# Patient Record
Sex: Female | Born: 1970 | Race: Black or African American | Hispanic: No | Smoking: Current every day smoker
Health system: Southern US, Community
[De-identification: ages and names within clinical notes are randomized; demographics above are authoritative.]

## PROBLEM LIST (undated history)

## (undated) DIAGNOSIS — E662 Morbid (severe) obesity with alveolar hypoventilation: Secondary | ICD-10-CM

## (undated) DIAGNOSIS — F4310 Post-traumatic stress disorder, unspecified: Secondary | ICD-10-CM

## (undated) DIAGNOSIS — I10 Essential (primary) hypertension: Secondary | ICD-10-CM

## (undated) DIAGNOSIS — F329 Major depressive disorder, single episode, unspecified: Secondary | ICD-10-CM

## (undated) DIAGNOSIS — F1910 Other psychoactive substance abuse, uncomplicated: Secondary | ICD-10-CM

## (undated) DIAGNOSIS — F419 Anxiety disorder, unspecified: Secondary | ICD-10-CM

## (undated) DIAGNOSIS — I5030 Unspecified diastolic (congestive) heart failure: Secondary | ICD-10-CM

## (undated) DIAGNOSIS — I639 Cerebral infarction, unspecified: Secondary | ICD-10-CM

## (undated) DIAGNOSIS — F32A Depression, unspecified: Secondary | ICD-10-CM

## (undated) DIAGNOSIS — J449 Chronic obstructive pulmonary disease, unspecified: Secondary | ICD-10-CM

## (undated) DIAGNOSIS — J45909 Unspecified asthma, uncomplicated: Secondary | ICD-10-CM

## (undated) HISTORY — PX: ECTOPIC PREGNANCY SURGERY: SHX613

## (undated) HISTORY — DX: Cerebral infarction, unspecified: I63.9

## (undated) HISTORY — DX: Morbid (severe) obesity with alveolar hypoventilation: E66.2

## (undated) HISTORY — DX: Other psychoactive substance abuse, uncomplicated: F19.10

## (undated) HISTORY — DX: Unspecified diastolic (congestive) heart failure: I50.30

---
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
## 2013-03-28 DIAGNOSIS — M545 Low back pain, unspecified: Secondary | ICD-10-CM | POA: Insufficient documentation

## 2013-03-28 DIAGNOSIS — D259 Leiomyoma of uterus, unspecified: Secondary | ICD-10-CM | POA: Insufficient documentation

## 2013-08-24 DIAGNOSIS — F4310 Post-traumatic stress disorder, unspecified: Secondary | ICD-10-CM | POA: Diagnosis present

## 2013-10-21 DIAGNOSIS — N83299 Other ovarian cyst, unspecified side: Secondary | ICD-10-CM | POA: Insufficient documentation

## 2013-10-21 DIAGNOSIS — F1010 Alcohol abuse, uncomplicated: Secondary | ICD-10-CM | POA: Insufficient documentation

## 2014-04-23 DIAGNOSIS — M65319 Trigger thumb, unspecified thumb: Secondary | ICD-10-CM | POA: Insufficient documentation

## 2014-05-10 DIAGNOSIS — G8929 Other chronic pain: Secondary | ICD-10-CM | POA: Insufficient documentation

## 2014-05-10 DIAGNOSIS — M79606 Pain in leg, unspecified: Secondary | ICD-10-CM | POA: Insufficient documentation

## 2014-06-03 DIAGNOSIS — G5603 Carpal tunnel syndrome, bilateral upper limbs: Secondary | ICD-10-CM | POA: Insufficient documentation

## 2014-10-21 DIAGNOSIS — F17210 Nicotine dependence, cigarettes, uncomplicated: Secondary | ICD-10-CM | POA: Insufficient documentation

## 2015-10-03 DIAGNOSIS — R32 Unspecified urinary incontinence: Secondary | ICD-10-CM | POA: Insufficient documentation

## 2015-10-03 DIAGNOSIS — Z30431 Encounter for routine checking of intrauterine contraceptive device: Secondary | ICD-10-CM | POA: Insufficient documentation

## 2016-03-06 DIAGNOSIS — E876 Hypokalemia: Secondary | ICD-10-CM | POA: Insufficient documentation

## 2016-03-06 DIAGNOSIS — K5900 Constipation, unspecified: Secondary | ICD-10-CM | POA: Insufficient documentation

## 2016-07-27 DIAGNOSIS — S161XXA Strain of muscle, fascia and tendon at neck level, initial encounter: Secondary | ICD-10-CM | POA: Insufficient documentation

## 2017-03-18 DIAGNOSIS — N289 Disorder of kidney and ureter, unspecified: Secondary | ICD-10-CM | POA: Insufficient documentation

## 2018-11-05 DIAGNOSIS — R112 Nausea with vomiting, unspecified: Secondary | ICD-10-CM | POA: Insufficient documentation

## 2018-11-06 DIAGNOSIS — F1910 Other psychoactive substance abuse, uncomplicated: Secondary | ICD-10-CM | POA: Insufficient documentation

## 2018-11-06 DIAGNOSIS — E785 Hyperlipidemia, unspecified: Secondary | ICD-10-CM | POA: Insufficient documentation

## 2019-07-28 DIAGNOSIS — F419 Anxiety disorder, unspecified: Secondary | ICD-10-CM | POA: Insufficient documentation

## 2019-10-21 ENCOUNTER — Encounter (HOSPITAL_COMMUNITY): Payer: Self-pay | Admitting: Emergency Medicine

## 2019-10-21 ENCOUNTER — Other Ambulatory Visit: Payer: Self-pay

## 2019-10-21 ENCOUNTER — Emergency Department (HOSPITAL_COMMUNITY)
Admission: EM | Admit: 2019-10-21 | Discharge: 2019-10-22 | Disposition: A | Discharge status: Home / Self Care | Payer: Self-pay | Attending: Emergency Medicine | Admitting: Emergency Medicine

## 2019-10-21 DIAGNOSIS — R110 Nausea: Secondary | ICD-10-CM | POA: Insufficient documentation

## 2019-10-21 DIAGNOSIS — N390 Urinary tract infection, site not specified: Secondary | ICD-10-CM | POA: Insufficient documentation

## 2019-10-21 DIAGNOSIS — I10 Essential (primary) hypertension: Secondary | ICD-10-CM | POA: Insufficient documentation

## 2019-10-21 DIAGNOSIS — J449 Chronic obstructive pulmonary disease, unspecified: Secondary | ICD-10-CM | POA: Insufficient documentation

## 2019-10-21 DIAGNOSIS — F17210 Nicotine dependence, cigarettes, uncomplicated: Secondary | ICD-10-CM | POA: Insufficient documentation

## 2019-10-21 DIAGNOSIS — J45909 Unspecified asthma, uncomplicated: Secondary | ICD-10-CM | POA: Insufficient documentation

## 2019-10-21 HISTORY — DX: Major depressive disorder, single episode, unspecified: F32.9

## 2019-10-21 HISTORY — DX: Chronic obstructive pulmonary disease, unspecified: J44.9

## 2019-10-21 HISTORY — DX: Anxiety disorder, unspecified: F41.9

## 2019-10-21 HISTORY — DX: Unspecified asthma, uncomplicated: J45.909

## 2019-10-21 HISTORY — DX: Essential (primary) hypertension: I10

## 2019-10-21 HISTORY — DX: Post-traumatic stress disorder, unspecified: F43.10

## 2019-10-21 LAB — COMPREHENSIVE METABOLIC PANEL
ALT: 16 U/L (ref 0–44)
AST: 20 U/L (ref 15–41)
Albumin: 4.2 g/dL (ref 3.5–5.0)
Alkaline Phosphatase: 67 U/L (ref 38–126)
Anion gap: 12 (ref 5–15)
BUN: 15 mg/dL (ref 6–20)
CO2: 22 mmol/L (ref 22–32)
Calcium: 9.7 mg/dL (ref 8.9–10.3)
Chloride: 104 mmol/L (ref 98–111)
Creatinine, Ser: 1.19 mg/dL — ABNORMAL HIGH (ref 0.44–1.00)
GFR calc Af Amer: >60 mL/min (ref >60)
GFR calc non Af Amer: 54 mL/min — ABNORMAL LOW (ref >60)
Glucose, Bld: 114 mg/dL — ABNORMAL HIGH (ref 70–99)
Potassium: 3.8 mmol/L (ref 3.5–5.1)
Sodium: 138 mmol/L (ref 135–145)
Total Bilirubin: 0.8 mg/dL (ref 0.3–1.2)
Total Protein: 8.2 g/dL — ABNORMAL HIGH (ref 6.5–8.1)

## 2019-10-21 LAB — CBC
HCT: 42.8 % (ref 36.0–46.0)
Hemoglobin: 13.7 g/dL (ref 12.0–15.0)
MCH: 29.5 pg (ref 26.0–34.0)
MCHC: 32 g/dL (ref 30.0–36.0)
MCV: 92 fL (ref 80.0–100.0)
Platelets: 298 10*3/uL (ref 150–400)
RBC: 4.65 MIL/uL (ref 3.87–5.11)
RDW: 13.6 % (ref 11.5–15.5)
WBC: 9.8 10*3/uL (ref 4.0–10.5)
nRBC: 0 % (ref 0.0–0.2)

## 2019-10-21 LAB — I-STAT BETA HCG BLOOD, ED (MC, WL, AP ONLY): I-stat hCG, quantitative: <5 m[IU]/mL (ref <5)

## 2019-10-21 LAB — LIPASE, BLOOD: Lipase: 20 U/L (ref 11–51)

## 2019-10-21 MED ORDER — ONDANSETRON HCL 4 MG/2ML IJ SOLN
4.0000 mg | Freq: Once | INTRAMUSCULAR | Status: AC
  Administered 2019-10-21: 4 mg via INTRAVENOUS
  Filled 2019-10-21: qty 2

## 2019-10-21 MED ORDER — SODIUM CHLORIDE 0.9 % IV BOLUS (SEPSIS)
1000.0000 mL | Freq: Once | INTRAVENOUS | Status: AC
  Administered 2019-10-21: 1000 mL via INTRAVENOUS

## 2019-10-21 MED ORDER — MORPHINE SULFATE (PF) 4 MG/ML IV SOLN
4.0000 mg | Freq: Once | INTRAVENOUS | Status: AC
  Administered 2019-10-21: 4 mg via INTRAVENOUS
  Filled 2019-10-21: qty 1

## 2019-10-21 MED ORDER — SODIUM CHLORIDE 0.9 % IV SOLN
1000.0000 mL | INTRAVENOUS | Status: DC
  Administered 2019-10-22 (×2): 1000 mL via INTRAVENOUS

## 2019-10-21 NOTE — ED Notes (Signed)
Pt became verbally aggressive with this nurse when she was told she could not have anything to eat or drink, as she is having emesis. Pt told this RN that "this is bullshit, I need to drink". Pt informed that she should not have anything to eat or drink until she is evaluated.

## 2019-10-21 NOTE — ED Notes (Addendum)
Pt had episode of emesis all over registration desk.  Digested food noted. Pt drinking cup of water from home at the time. Pt brought immediately to triage room.

## 2019-10-21 NOTE — ED Triage Notes (Signed)
Pt c/o abd pains with vomiting that started today. Denies urinary or bowel problems.  ?

## 2019-10-22 ENCOUNTER — Encounter (HOSPITAL_COMMUNITY): Payer: Self-pay

## 2019-10-22 ENCOUNTER — Emergency Department (HOSPITAL_COMMUNITY): Payer: Self-pay

## 2019-10-22 LAB — URINALYSIS, ROUTINE W REFLEX MICROSCOPIC
Bilirubin Urine: NEGATIVE
Glucose, UA: NEGATIVE mg/dL
Hgb urine dipstick: NEGATIVE
Ketones, ur: 20 mg/dL — AB
Nitrite: NEGATIVE
Protein, ur: 100 mg/dL — AB
Specific Gravity, Urine: 1.035 — ABNORMAL HIGH (ref 1.005–1.030)
pH: 5 (ref 5.0–8.0)

## 2019-10-22 IMAGING — CT CT ABD-PELV W/ CM
2 of 5 series · 16 of 46 positions shown, 18 images · IV contrast (OMNIPAQUE 300)
Comparison: None.

CLINICAL DATA: Acute abdominal pain. Prior surgery for ectopic
pregnancy.

EXAM:
CT ABDOMEN AND PELVIS WITH CONTRAST
TECHNIQUE: Multidetector CT imaging of the abdomen and pelvis was performed
using the standard protocol following bolus administration of
intravenous contrast.
CONTRAST:  100mL OMNIPAQUE IOHEXOL 300 MG/ML  SOLN

[Series 2: axial st · axial · 0.72mm/px · z∈[+1132,+1482]mm · 13 of 81 slices shown, 15 images]
[im 6/81  soft-tissue]
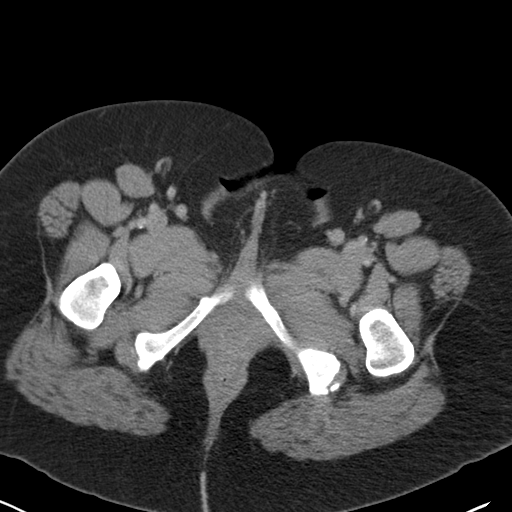
[im 6/81  bone]
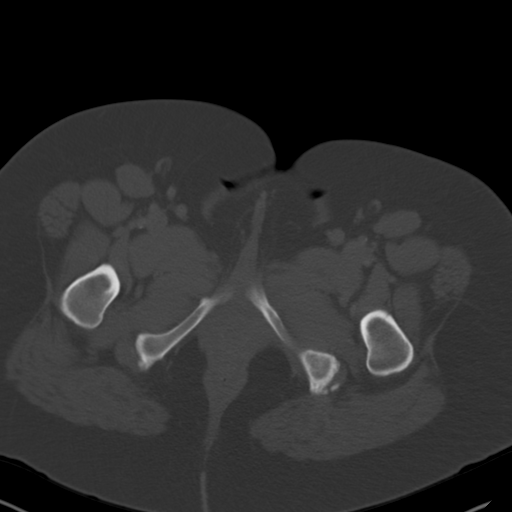
[im 11/81  soft-tissue]
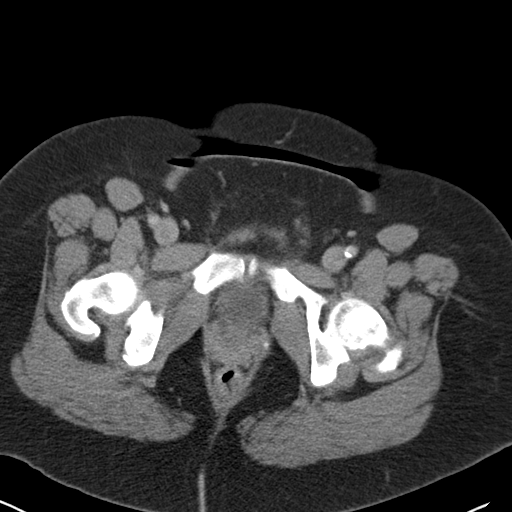
[im 16/81  soft-tissue]
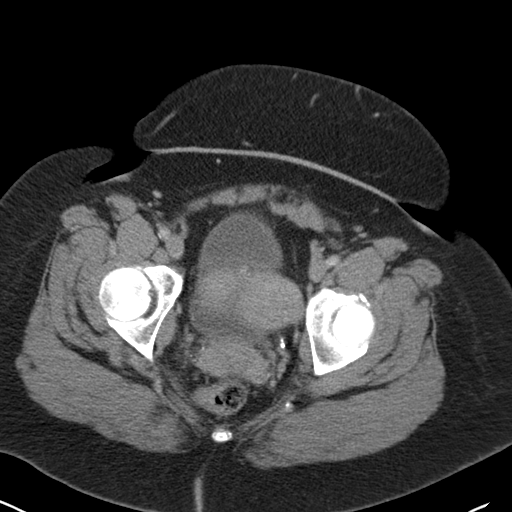
[im 26/81  soft-tissue]
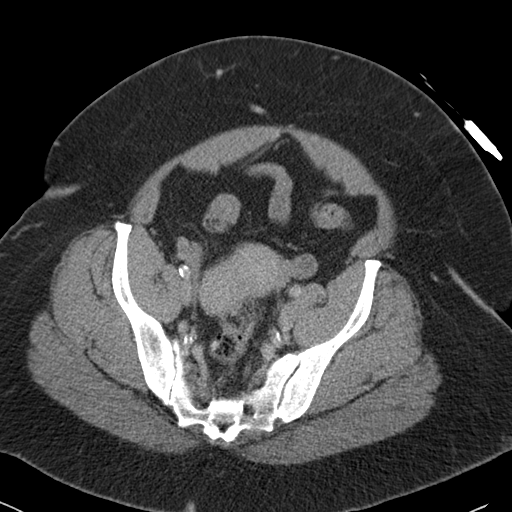
[im 31/81  soft-tissue]
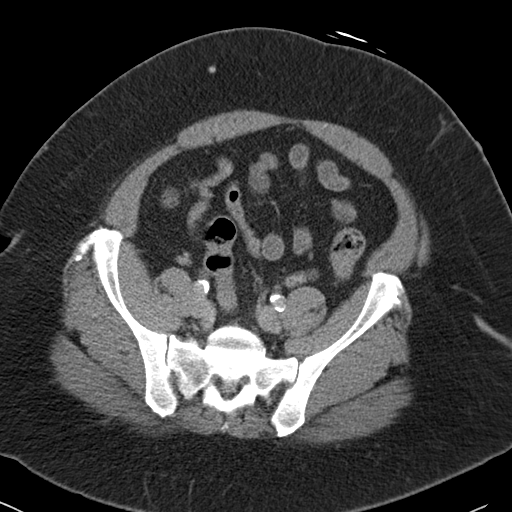
[im 36/81  soft-tissue]
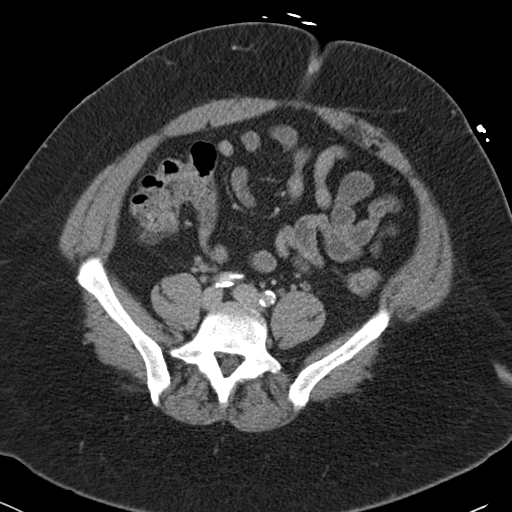
[im 41/81  soft-tissue]
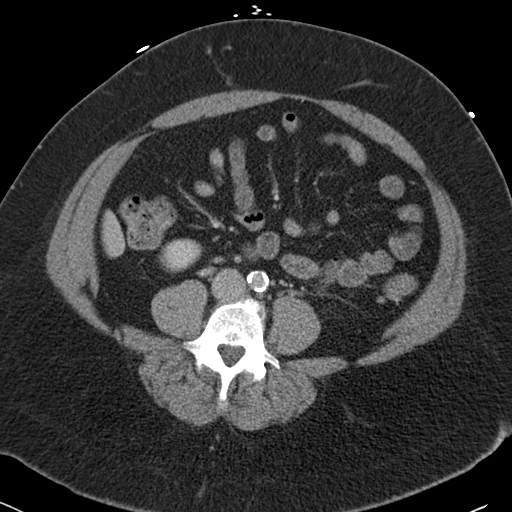
[im 46/81  soft-tissue]
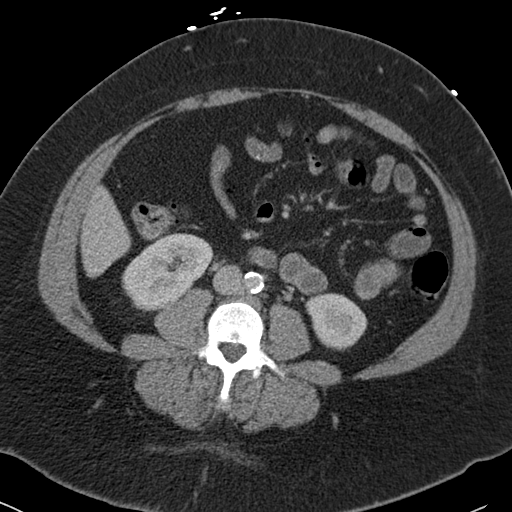
[im 51/81  soft-tissue]
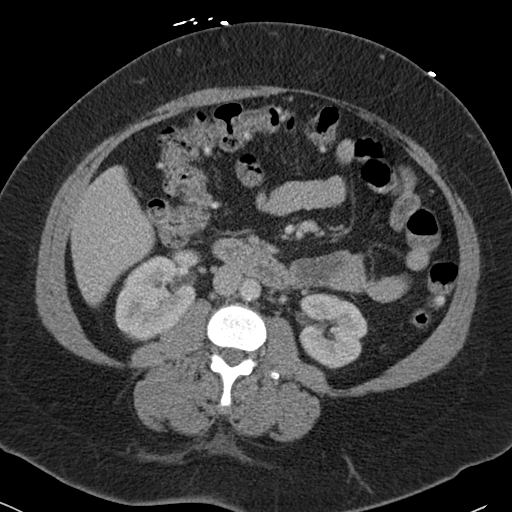
[im 51/81  bone]
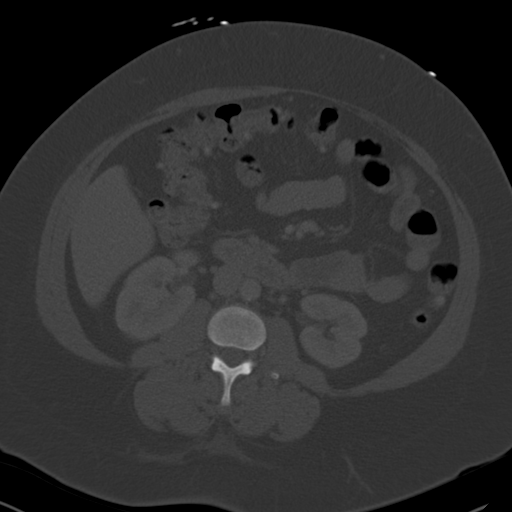
[im 56/81  soft-tissue]
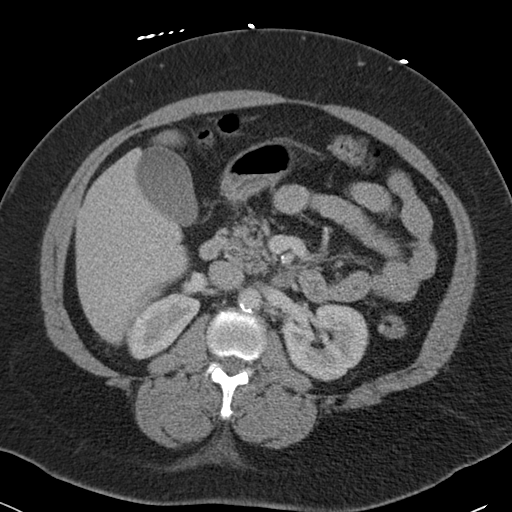
[im 66/81  soft-tissue]
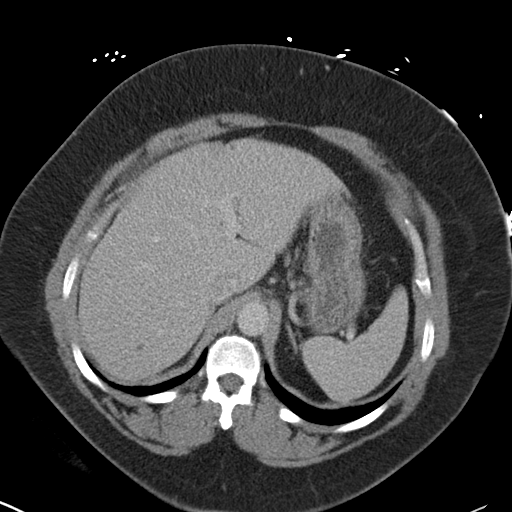
[im 71/81  soft-tissue]
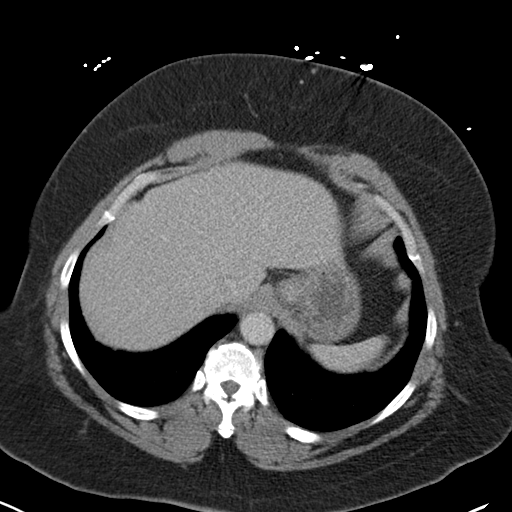
[im 76/81  soft-tissue]
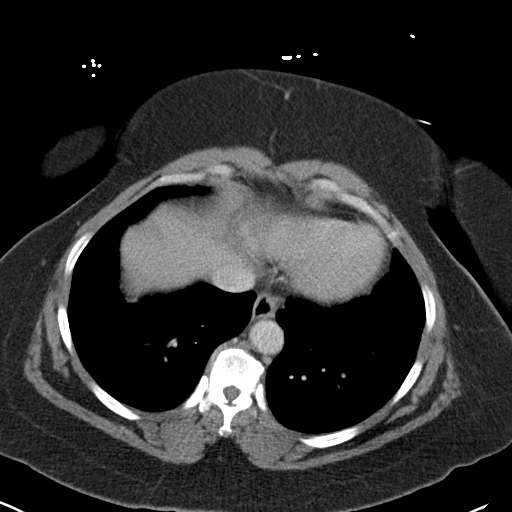

[Series 4: coronal st · coronal · 0.77mm/px · 3 of 160 slices shown]
[im 54/160  soft-tissue]
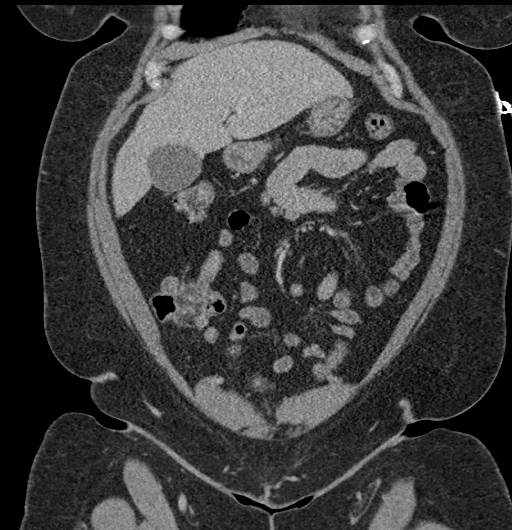
[im 71/160  soft-tissue]
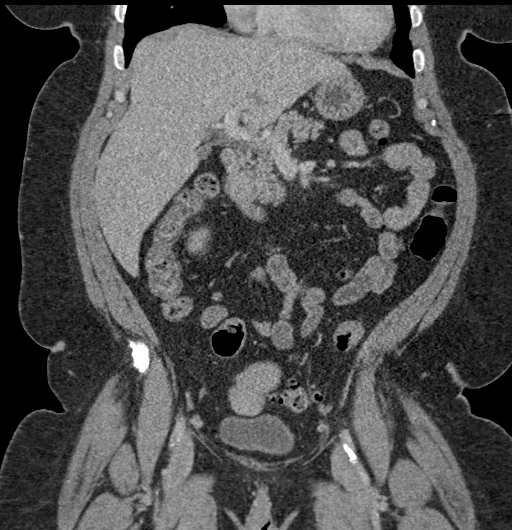
[im 89/160  soft-tissue]
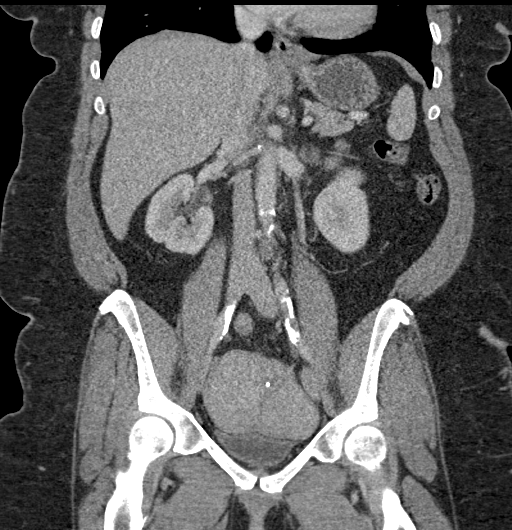

[16 of 46 positions shown; findings below may reference images not displayed]

FINDINGS: Lower chest: The lung bases are clear. The heart size is normal.

Hepatobiliary: There are multiple small hypoattenuating lesions
throughout the liver which are too small to characterize but are
statistically most likely to represent benign cysts. Normal
gallbladder.There is no biliary ductal dilation.

Pancreas: Normal contours without ductal dilatation. No
peripancreatic fluid collection.

Spleen: No splenic laceration or hematoma.

Adrenals/Urinary Tract:

--Adrenal glands: No adrenal hemorrhage.

--Right kidney/ureter: No hydronephrosis or perinephric hematoma.

--Left kidney/ureter: No hydronephrosis or perinephric hematoma.

--Urinary bladder: Unremarkable.

Stomach/Bowel:

--Stomach/Duodenum: No hiatal hernia or other gastric abnormality.
Normal duodenal course and caliber.

--Small bowel: No dilatation or inflammation.

--Colon: There are scattered colonic diverticula without CT evidence
for diverticulitis.

--Appendix: Normal.

Vascular/Lymphatic: Atherosclerotic calcification is present within
the non-aneurysmal abdominal aorta, without hemodynamically
significant stenosis.

--No retroperitoneal lymphadenopathy.

--No mesenteric lymphadenopathy.

--No pelvic or inguinal lymphadenopathy.

Reproductive: There is a bulky appearance of the uterus favored to
be secondary to multiple fibroids. An IUD is noted.

Other: No ascites or free air. The abdominal wall is normal.

Musculoskeletal. No acute displaced fractures.
IMPRESSION: 1. No acute abdominopelvic abnormality.
2. Bulky appearance of the uterus favored to be secondary to
multiple fibroids.
3. Scattered colonic diverticula without CT evidence for
diverticulitis.

Aortic Atherosclerosis ([5O]-[5O]).

## 2019-10-22 MED ORDER — OXYCODONE-ACETAMINOPHEN 5-325 MG PO TABS: 1.0000 | ORAL_TABLET | Freq: Three times a day (TID) | ORAL | 0 refills | Status: DC | PRN

## 2019-10-22 MED ORDER — ACETAMINOPHEN 500 MG PO TABS
1000.0000 mg | ORAL_TABLET | Freq: Once | ORAL | Status: AC
  Administered 2019-10-22: 1000 mg via ORAL
  Filled 2019-10-22: qty 2

## 2019-10-22 MED ORDER — PHENAZOPYRIDINE HCL 100 MG PO TABS
100.0000 mg | ORAL_TABLET | Freq: Three times a day (TID) | ORAL | Status: DC
  Administered 2019-10-22: 100 mg via ORAL
  Filled 2019-10-22: qty 1

## 2019-10-22 MED ORDER — IOHEXOL 300 MG/ML  SOLN
100.0000 mL | Freq: Once | INTRAMUSCULAR | Status: AC | PRN
  Administered 2019-10-22: 01:00:00 100 mL via INTRAVENOUS

## 2019-10-22 MED ORDER — SODIUM CHLORIDE (PF) 0.9 % IJ SOLN
INTRAMUSCULAR | Status: AC
  Filled 2019-10-22: qty 50

## 2019-10-22 MED ORDER — METOCLOPRAMIDE HCL 5 MG/ML IJ SOLN
10.0000 mg | Freq: Once | INTRAMUSCULAR | Status: AC
  Administered 2019-10-22: 10 mg via INTRAVENOUS
  Filled 2019-10-22: qty 2

## 2019-10-22 MED ORDER — ONDANSETRON HCL 4 MG/2ML IJ SOLN
INTRAMUSCULAR | Status: AC
  Filled 2019-10-22: qty 2

## 2019-10-22 MED ORDER — FENTANYL CITRATE (PF) 100 MCG/2ML IJ SOLN
50.0000 ug | Freq: Once | INTRAMUSCULAR | Status: AC
  Administered 2019-10-22: 50 ug via INTRAVENOUS
  Filled 2019-10-22: qty 2

## 2019-10-22 MED ORDER — METOCLOPRAMIDE HCL 10 MG PO TABS: 10.0000 mg | ORAL_TABLET | Freq: Four times a day (QID) | ORAL | 0 refills | Status: DC

## 2019-10-22 MED ORDER — LACTATED RINGERS IV BOLUS
1000.0000 mL | Freq: Once | INTRAVENOUS | Status: AC
  Administered 2019-10-22: 1000 mL via INTRAVENOUS

## 2019-10-22 MED ORDER — SODIUM CHLORIDE 0.9 % IV SOLN
1.0000 g | Freq: Once | INTRAVENOUS | Status: AC
  Administered 2019-10-22: 02:00:00 1 g via INTRAVENOUS
  Filled 2019-10-22: qty 10

## 2019-10-22 MED ORDER — CEPHALEXIN 500 MG PO CAPS: 500.0000 mg | ORAL_CAPSULE | Freq: Four times a day (QID) | ORAL | 0 refills | Status: DC

## 2019-10-22 NOTE — ED Provider Notes (Signed)
3:50 AM Assumed care from Dr. Tomi Bamberger, please see their note for full history, physical and decision making until this point. In brief this is a 48 y.o. year old female who presented to the ED tonight with Abdominal Pain and Emesis     Pending CT/urine and PO challenge/pain improved.   Ultimately work-up revealed urinary tract infection. With her systemic symptoms we will treat her for pyelonephritis although CT scan was negative. Patient tolerating p.o. Pain improved upon discharge. Supportive care at home with PCP follow-up in a week to ensure improvement.  Discharge instructions, including strict return precautions for new or worsening symptoms, given. Patient and/or family verbalized understanding and agreement with the plan as described.   Labs, studies and imaging reviewed by myself and considered in medical decision making if ordered. Imaging interpreted by radiology.  Labs Reviewed  COMPREHENSIVE METABOLIC PANEL - Abnormal; Notable for the following components:      Result Value   Glucose, Bld 114 (*)    Creatinine, Ser 1.19 (*)    Total Protein 8.2 (*)    GFR calc non Af Amer 54 (*)    All other components within normal limits  URINALYSIS, ROUTINE W REFLEX MICROSCOPIC - Abnormal; Notable for the following components:   Color, Urine AMBER (*)    APPearance CLOUDY (*)    Specific Gravity, Urine 1.035 (*)    Ketones, ur 20 (*)    Protein, ur 100 (*)    Leukocytes,Ua LARGE (*)    Bacteria, UA MANY (*)    All other components within normal limits  LIPASE, BLOOD  CBC  I-STAT BETA HCG BLOOD, ED ([facility], [facility], [facility] ONLY)    CT ABDOMEN PELVIS W CONTRAST  Final Result      No follow-ups on file.    Cathy Hall, Corene Cornea, MD 10/22/19 [phone]

## 2019-10-22 NOTE — ED Notes (Signed)
Went over discharge papers with pt, pt had no further questions or concerns. Pt assisted to wheelchair and taken to exit without incident. Pt given a blanket at discharge because she had thrown up on her clothes and was only wearing paper scrubs. Unable to obtain a discharge signature due to computer malfunction.

## 2019-10-22 NOTE — ED Provider Notes (Signed)
[facility]-EMERGENCY DEPT Provider Note   CSN: [id number] Arrival date & time: 10/21/19  1816     History Chief Complaint  Patient presents with  . Abdominal Pain  . Emesis    Cathy Hall is a 48 y.o. female.  HPI   Patient presented to the emergency room for evaluation of abdominal pain.  Pain feels like a burning discomfort in her upper abdomen.  Patient states her symptoms started today.  She began having episodes of vomiting.  She denies any diarrhea or dysuria.  She denies any fevers or chills.  No chest pain or shortness of breath.  Past Medical History:  Diagnosis Date  . Anxiety   . Asthma   . COPD (chronic obstructive pulmonary disease) (HCC)   . Hypertension   . Major depression   . PTSD (post-traumatic stress disorder)     There are no problems to display for this patient.   Past Surgical History:  Procedure Laterality Date  . ECTOPIC PREGNANCY SURGERY       OB History   No obstetric history on file.     No family history on file.  Social History   Tobacco Use  . Smoking status: Current Every Day Smoker    Types: Cigarettes  . Smokeless tobacco: Never Used  Substance Use Topics  . Alcohol use: Not on file  . Drug use: Not on file    Home Medications Prior to Admission medications   Not on File    Allergies    Patient has no known allergies.  Review of Systems   Review of Systems  All other systems reviewed and are negative.   Physical Exam Updated Vital Signs BP 121/88   Pulse 80   Temp 97.8 F (36.6 C) (Oral)   Resp 20   SpO2 97%   Physical Exam Vitals and nursing note reviewed.  Constitutional:      General: She is not in acute distress.    Appearance: She is well-developed.  HENT:     Head: Normocephalic and atraumatic.     Right Ear: External ear normal.     Left Ear: External ear normal.  Eyes:     General: No scleral icterus.       Right eye: No discharge.        Left eye:  No discharge.     Conjunctiva/sclera: Conjunctivae normal.  Neck:     Trachea: No tracheal deviation.  Cardiovascular:     Rate and Rhythm: Normal rate and regular rhythm.  Pulmonary:     Effort: Pulmonary effort is normal. No respiratory distress.     Breath sounds: Normal breath sounds. No stridor. No wheezing or rales.  Abdominal:     General: Bowel sounds are normal. There is no distension.     Palpations: Abdomen is soft.     Tenderness: There is abdominal tenderness in the epigastric area. There is no guarding or rebound.  Musculoskeletal:        General: No tenderness.     Cervical back: Neck supple.  Skin:    General: Skin is warm and dry.     Findings: No rash.  Neurological:     Mental Status: She is alert.     Cranial Nerves: No cranial nerve deficit (no facial droop, extraocular movements intact, no slurred speech).     Sensory: No sensory deficit.     Motor: No abnormal muscle tone or seizure activity.  Coordination: Coordination normal.     ED Results / Procedures / Treatments   Labs (all labs ordered are listed, but only abnormal results are displayed) Labs Reviewed  COMPREHENSIVE METABOLIC PANEL - Abnormal; Notable for the following components:      Result Value   Glucose, Bld 114 (*)    Creatinine, Ser 1.19 (*)    Total Protein 8.2 (*)    GFR calc non Af Amer 54 (*)    All other components within normal limits  LIPASE, BLOOD  CBC  URINALYSIS, ROUTINE W REFLEX MICROSCOPIC  I-STAT BETA HCG BLOOD, ED ([facility], [facility], [facility] ONLY)     Procedures Procedures (including critical care time)  Medications Ordered in ED Medications  sodium chloride 0.9 % bolus 1,000 mL (1,000 mLs Intravenous New Bag/Given 10/21/19 2344)    Followed by  0.9 %  sodium chloride infusion (has no administration in time range)  sodium chloride (PF) 0.9 % injection (has no administration in time range)  morphine 4 MG/ML injection 4 mg (4 mg Intravenous Given 10/21/19 2342)  ondansetron  (ZOFRAN) injection 4 mg (4 mg Intravenous Given 10/21/19 2341)    ED Course  I have reviewed the triage vital signs and the nursing notes.  Pertinent labs & imaging results that were available during my care of the patient were reviewed by me and considered in my medical decision making (see chart for details).    MDM Rules/Calculators/A&P                      Patient's laboratory tests are reassuring.  She had persistent tenderness on exam, ct scan ordered for further evaluation.  If negative, stable for dc symptomatic management.  Care turned over to oncoming team. Final Clinical Impression(s) / ED Diagnoses pending   Dorie Rank, MD 10/22/19 [phone]

## 2019-10-22 NOTE — ED Notes (Signed)
Pt. Documented in error see above note in chart. 

## 2019-11-05 ENCOUNTER — Emergency Department (HOSPITAL_COMMUNITY): Payer: No Typology Code available for payment source

## 2019-11-05 ENCOUNTER — Other Ambulatory Visit: Payer: Self-pay

## 2019-11-05 ENCOUNTER — Emergency Department (HOSPITAL_COMMUNITY)
Admission: EM | Admit: 2019-11-05 | Discharge: 2019-11-06 | Disposition: A | Discharge status: Home / Self Care | Payer: No Typology Code available for payment source | Attending: Emergency Medicine | Admitting: Emergency Medicine

## 2019-11-05 ENCOUNTER — Encounter (HOSPITAL_COMMUNITY): Payer: Self-pay

## 2019-11-05 DIAGNOSIS — R10817 Generalized abdominal tenderness: Secondary | ICD-10-CM | POA: Insufficient documentation

## 2019-11-05 DIAGNOSIS — Y999 Unspecified external cause status: Secondary | ICD-10-CM | POA: Insufficient documentation

## 2019-11-05 DIAGNOSIS — M79621 Pain in right upper arm: Secondary | ICD-10-CM | POA: Insufficient documentation

## 2019-11-05 DIAGNOSIS — M25512 Pain in left shoulder: Secondary | ICD-10-CM | POA: Insufficient documentation

## 2019-11-05 DIAGNOSIS — T1490XA Injury, unspecified, initial encounter: Secondary | ICD-10-CM | POA: Diagnosis present

## 2019-11-05 DIAGNOSIS — M79651 Pain in right thigh: Secondary | ICD-10-CM | POA: Insufficient documentation

## 2019-11-05 DIAGNOSIS — S8992XA Unspecified injury of left lower leg, initial encounter: Secondary | ICD-10-CM

## 2019-11-05 DIAGNOSIS — R4182 Altered mental status, unspecified: Secondary | ICD-10-CM | POA: Diagnosis not present

## 2019-11-05 DIAGNOSIS — R0789 Other chest pain: Secondary | ICD-10-CM | POA: Insufficient documentation

## 2019-11-05 DIAGNOSIS — Y929 Unspecified place or not applicable: Secondary | ICD-10-CM | POA: Insufficient documentation

## 2019-11-05 DIAGNOSIS — Y9301 Activity, walking, marching and hiking: Secondary | ICD-10-CM | POA: Diagnosis not present

## 2019-11-05 HISTORY — DX: Depression, unspecified: F32.A

## 2019-11-05 LAB — I-STAT CHEM 8, ED
BUN: 10 mg/dL (ref 6–20)
Calcium, Ion: 1.15 mmol/L (ref 1.15–1.40)
Chloride: 106 mmol/L (ref 98–111)
Creatinine, Ser: 1.2 mg/dL — ABNORMAL HIGH (ref 0.44–1.00)
Glucose, Bld: 142 mg/dL — ABNORMAL HIGH (ref 70–99)
HCT: 41 % (ref 36.0–46.0)
Hemoglobin: 13.9 g/dL (ref 12.0–15.0)
Potassium: 3.7 mmol/L (ref 3.5–5.1)
Sodium: 140 mmol/L (ref 135–145)
TCO2: 26 mmol/L (ref 22–32)

## 2019-11-05 LAB — CBC
HCT: 42.7 % (ref 36.0–46.0)
Hemoglobin: 13.7 g/dL (ref 12.0–15.0)
MCH: 30 pg (ref 26.0–34.0)
MCHC: 32.1 g/dL (ref 30.0–36.0)
MCV: 93.6 fL (ref 80.0–100.0)
Platelets: 266 10*3/uL (ref 150–400)
RBC: 4.56 MIL/uL (ref 3.87–5.11)
RDW: 13.8 % (ref 11.5–15.5)
WBC: 11.1 10*3/uL — ABNORMAL HIGH (ref 4.0–10.5)
nRBC: 0 % (ref 0.0–0.2)

## 2019-11-05 LAB — COMPREHENSIVE METABOLIC PANEL
ALT: 19 U/L (ref 0–44)
AST: 34 U/L (ref 15–41)
Albumin: 3.8 g/dL (ref 3.5–5.0)
Alkaline Phosphatase: 60 U/L (ref 38–126)
Anion gap: 11 (ref 5–15)
BUN: 11 mg/dL (ref 6–20)
CO2: 23 mmol/L (ref 22–32)
Calcium: 9.1 mg/dL (ref 8.9–10.3)
Chloride: 105 mmol/L (ref 98–111)
Creatinine, Ser: 1.2 mg/dL — ABNORMAL HIGH (ref 0.44–1.00)
GFR calc Af Amer: >60 mL/min (ref >60)
GFR calc non Af Amer: 54 mL/min — ABNORMAL LOW (ref >60)
Glucose, Bld: 149 mg/dL — ABNORMAL HIGH (ref 70–99)
Potassium: 3.6 mmol/L (ref 3.5–5.1)
Sodium: 139 mmol/L (ref 135–145)
Total Bilirubin: 0.6 mg/dL (ref 0.3–1.2)
Total Protein: 7 g/dL (ref 6.5–8.1)

## 2019-11-05 LAB — ETHANOL: Alcohol, Ethyl (B): <10 mg/dL (ref <10)

## 2019-11-05 LAB — I-STAT BETA HCG BLOOD, ED (MC, WL, AP ONLY): I-stat hCG, quantitative: <5 m[IU]/mL (ref <5)

## 2019-11-05 LAB — CDS SEROLOGY

## 2019-11-05 LAB — PROTIME-INR
INR: 1 (ref 0.8–1.2)
Prothrombin Time: 12.6 seconds (ref 11.4–15.2)

## 2019-11-05 LAB — SAMPLE TO BLOOD BANK

## 2019-11-05 LAB — LACTIC ACID, PLASMA: Lactic Acid, Venous: 1.6 mmol/L (ref 0.5–1.9)

## 2019-11-05 MED ORDER — FENTANYL CITRATE (PF) 100 MCG/2ML IJ SOLN
50.0000 ug | Freq: Once | INTRAMUSCULAR | Status: AC
  Administered 2019-11-05: 50 ug via INTRAVENOUS
  Filled 2019-11-05: qty 2

## 2019-11-05 MED ORDER — LORAZEPAM 2 MG/ML IJ SOLN
1.0000 mg | Freq: Once | INTRAMUSCULAR | Status: AC
  Administered 2019-11-05: 1 mg via INTRAVENOUS
  Filled 2019-11-05: qty 1

## 2019-11-05 MED ORDER — FENTANYL CITRATE (PF) 100 MCG/2ML IJ SOLN
50.0000 ug | Freq: Once | INTRAMUSCULAR | Status: AC
  Administered 2019-11-05: 50 ug via INTRAVENOUS

## 2019-11-05 MED ORDER — LACTATED RINGERS IV BOLUS
1000.0000 mL | Freq: Once | INTRAVENOUS | Status: AC
  Administered 2019-11-05: 1000 mL via INTRAVENOUS

## 2019-11-05 MED ORDER — IOHEXOL 300 MG/ML  SOLN
100.0000 mL | Freq: Once | INTRAMUSCULAR | Status: AC | PRN
  Administered 2019-11-05: 100 mL via INTRAVENOUS

## 2019-11-05 MED ORDER — HYDROCODONE-ACETAMINOPHEN 5-325 MG PO TABS
1.0000 | ORAL_TABLET | Freq: Once | ORAL | Status: DC
  Filled 2019-11-05: qty 1

## 2019-11-05 NOTE — ED Notes (Signed)
Pt is awake, rolling around, yelling and agitated. Water provided. Pt requesting sandwich, advised I would get one for her in a moment.
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
## 2019-11-05 NOTE — Progress Notes (Signed)
Chaplain called Williette's son and daughter in law.  Chaplain was able to touch base with son who is currently in [name] but not daughter in law who resides in [place].  Son said he would give daughter in law, Richmond, a call.  Chaplain will follow-up as needed.  Both contacts for son and daughter in law are listed in chart.

## 2019-11-05 NOTE — Progress Notes (Signed)
Trauma Response Nurse: Kriste Basque   Primary RN: Gwenith Daily   Interventions: Attempted IV start, removal of back board, assisted in assessment and FAST exam.   Complications: Difficult IV start, US guided IV gained by Dr. Elana Alm Resident. Blood work sent to lab,  [employer].  Plan of Care: CT after I-stat results, Xrays have been obtained. Chaplain and [employer] at the bedside.    Trauma Services [phone]
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
## 2019-11-06 MED ORDER — IBUPROFEN 800 MG PO TABS: 800.0000 mg | ORAL_TABLET | Freq: Three times a day (TID) | ORAL | 0 refills | Status: DC | PRN

## 2019-11-06 NOTE — ED Notes (Signed)
Attempted to get pt up to walk, pt refused to try to walk, pt requesting food

## 2019-11-06 NOTE — Discharge Instructions (Signed)
Return here as needed.  Follow-up with the orthopedist provided.  Ice and elevate the leg.

## 2019-11-06 NOTE — Progress Notes (Signed)
Orthopedic Tech Progress Note Patient Details:  Cathy Hall May 20, 1971 [id number] Nurse asked for a knee immobilizer Ortho Devices Type of Ortho Device: Knee Immobilizer Ortho Device/Splint Location: RLE Ortho Device/Splint Interventions: Ordered, Application   Post Interventions Patient Tolerated: Poor Instructions Provided: Care of device   Ancil Linsey 11/06/2019, 12:27 AM

## 2019-11-10 ENCOUNTER — Encounter (HOSPITAL_COMMUNITY): Payer: Self-pay

## 2019-12-27 DIAGNOSIS — I161 Hypertensive emergency: Secondary | ICD-10-CM | POA: Insufficient documentation

## 2019-12-31 DIAGNOSIS — Z634 Disappearance and death of family member: Secondary | ICD-10-CM | POA: Insufficient documentation

## 2019-12-31 DIAGNOSIS — R45851 Suicidal ideations: Secondary | ICD-10-CM | POA: Insufficient documentation

## 2020-07-17 ENCOUNTER — Emergency Department (HOSPITAL_COMMUNITY): Payer: Self-pay

## 2020-07-17 ENCOUNTER — Emergency Department (HOSPITAL_COMMUNITY)
Admission: EM | Admit: 2020-07-17 | Discharge: 2020-07-17 | Disposition: A | Discharge status: Home / Self Care | Payer: Self-pay | Attending: Emergency Medicine | Admitting: Emergency Medicine

## 2020-07-17 ENCOUNTER — Other Ambulatory Visit: Payer: Self-pay

## 2020-07-17 ENCOUNTER — Encounter (HOSPITAL_COMMUNITY): Payer: Self-pay | Admitting: Emergency Medicine

## 2020-07-17 DIAGNOSIS — J45909 Unspecified asthma, uncomplicated: Secondary | ICD-10-CM | POA: Insufficient documentation

## 2020-07-17 DIAGNOSIS — F17210 Nicotine dependence, cigarettes, uncomplicated: Secondary | ICD-10-CM | POA: Insufficient documentation

## 2020-07-17 DIAGNOSIS — Y939 Activity, unspecified: Secondary | ICD-10-CM | POA: Insufficient documentation

## 2020-07-17 DIAGNOSIS — W19XXXA Unspecified fall, initial encounter: Secondary | ICD-10-CM | POA: Insufficient documentation

## 2020-07-17 DIAGNOSIS — S7001XA Contusion of right hip, initial encounter: Secondary | ICD-10-CM | POA: Insufficient documentation

## 2020-07-17 DIAGNOSIS — Y929 Unspecified place or not applicable: Secondary | ICD-10-CM | POA: Insufficient documentation

## 2020-07-17 DIAGNOSIS — Y999 Unspecified external cause status: Secondary | ICD-10-CM | POA: Insufficient documentation

## 2020-07-17 DIAGNOSIS — J449 Chronic obstructive pulmonary disease, unspecified: Secondary | ICD-10-CM | POA: Insufficient documentation

## 2020-07-17 DIAGNOSIS — S8001XA Contusion of right knee, initial encounter: Secondary | ICD-10-CM | POA: Insufficient documentation

## 2020-07-17 DIAGNOSIS — I10 Essential (primary) hypertension: Secondary | ICD-10-CM | POA: Insufficient documentation

## 2020-07-17 IMAGING — CR DG HIP (WITH OR WITHOUT PELVIS) 2-3V*R*
3 series · 3 of 3 positions shown · non-contrast
Comparison: CT abdomen and pelvis from [DATE]

CLINICAL DATA: Fall, RIGHT knee pain hip main in shoulder pain

EXAM:
DG HIP (WITH OR WITHOUT PELVIS) 2-3V RIGHT

[t hip ap right (1 of 2)]
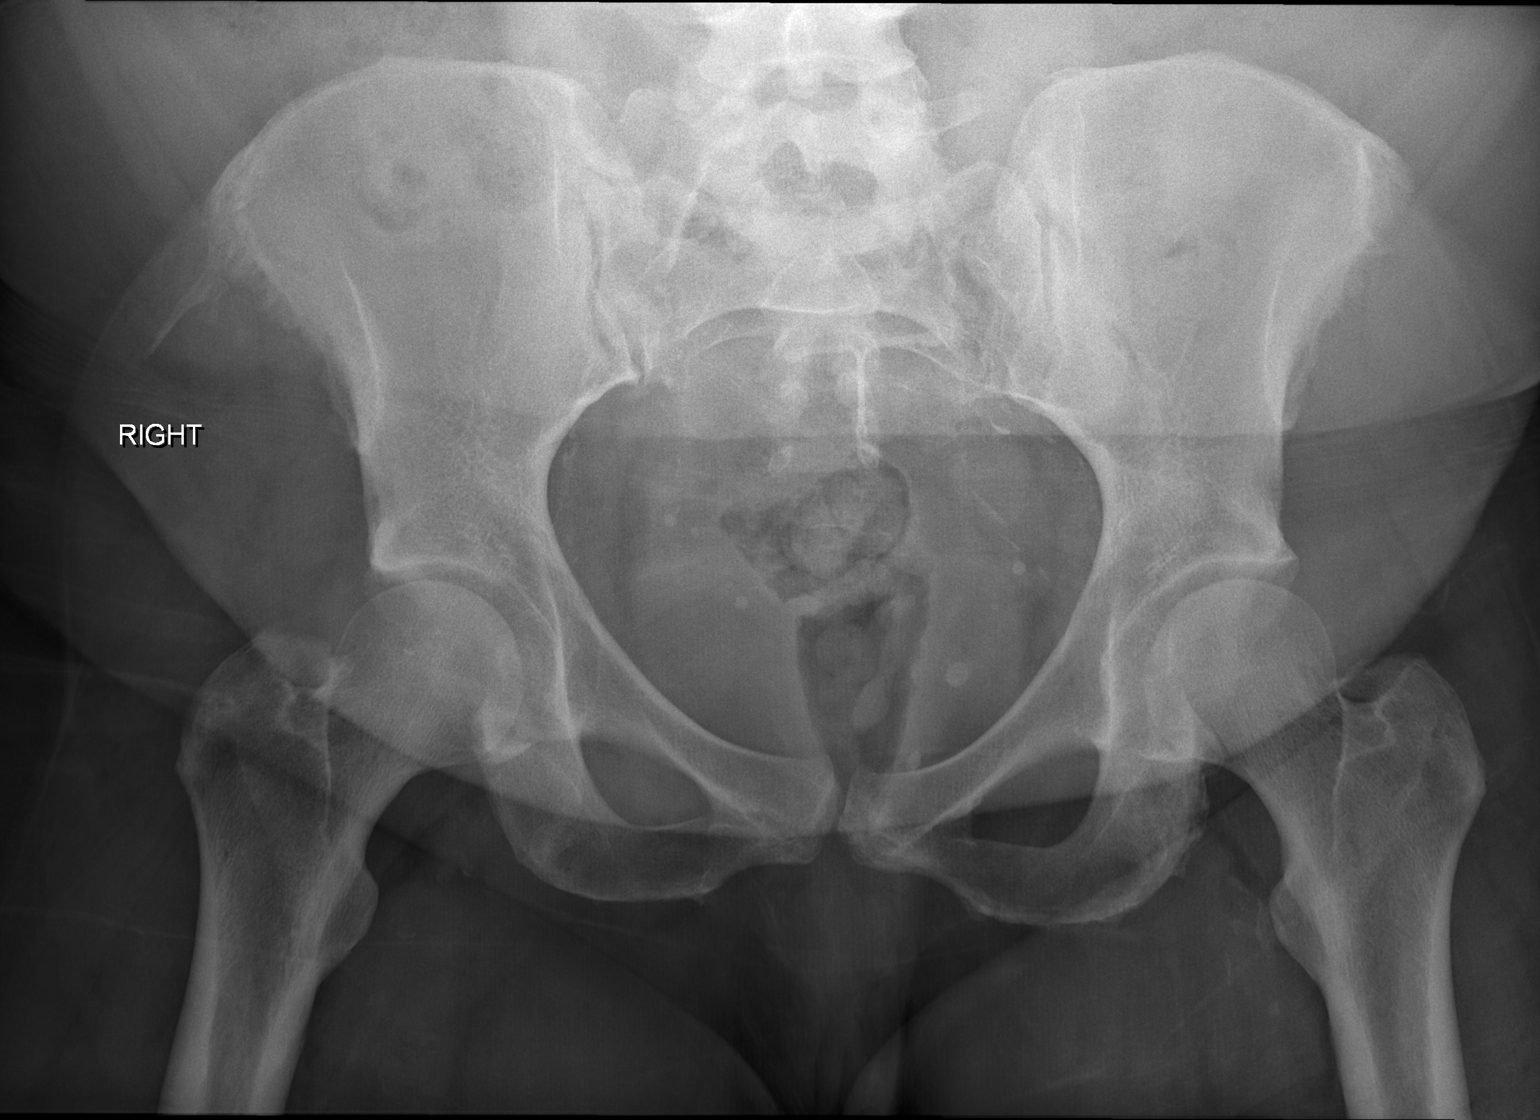

[t hip ap right (2 of 2)]
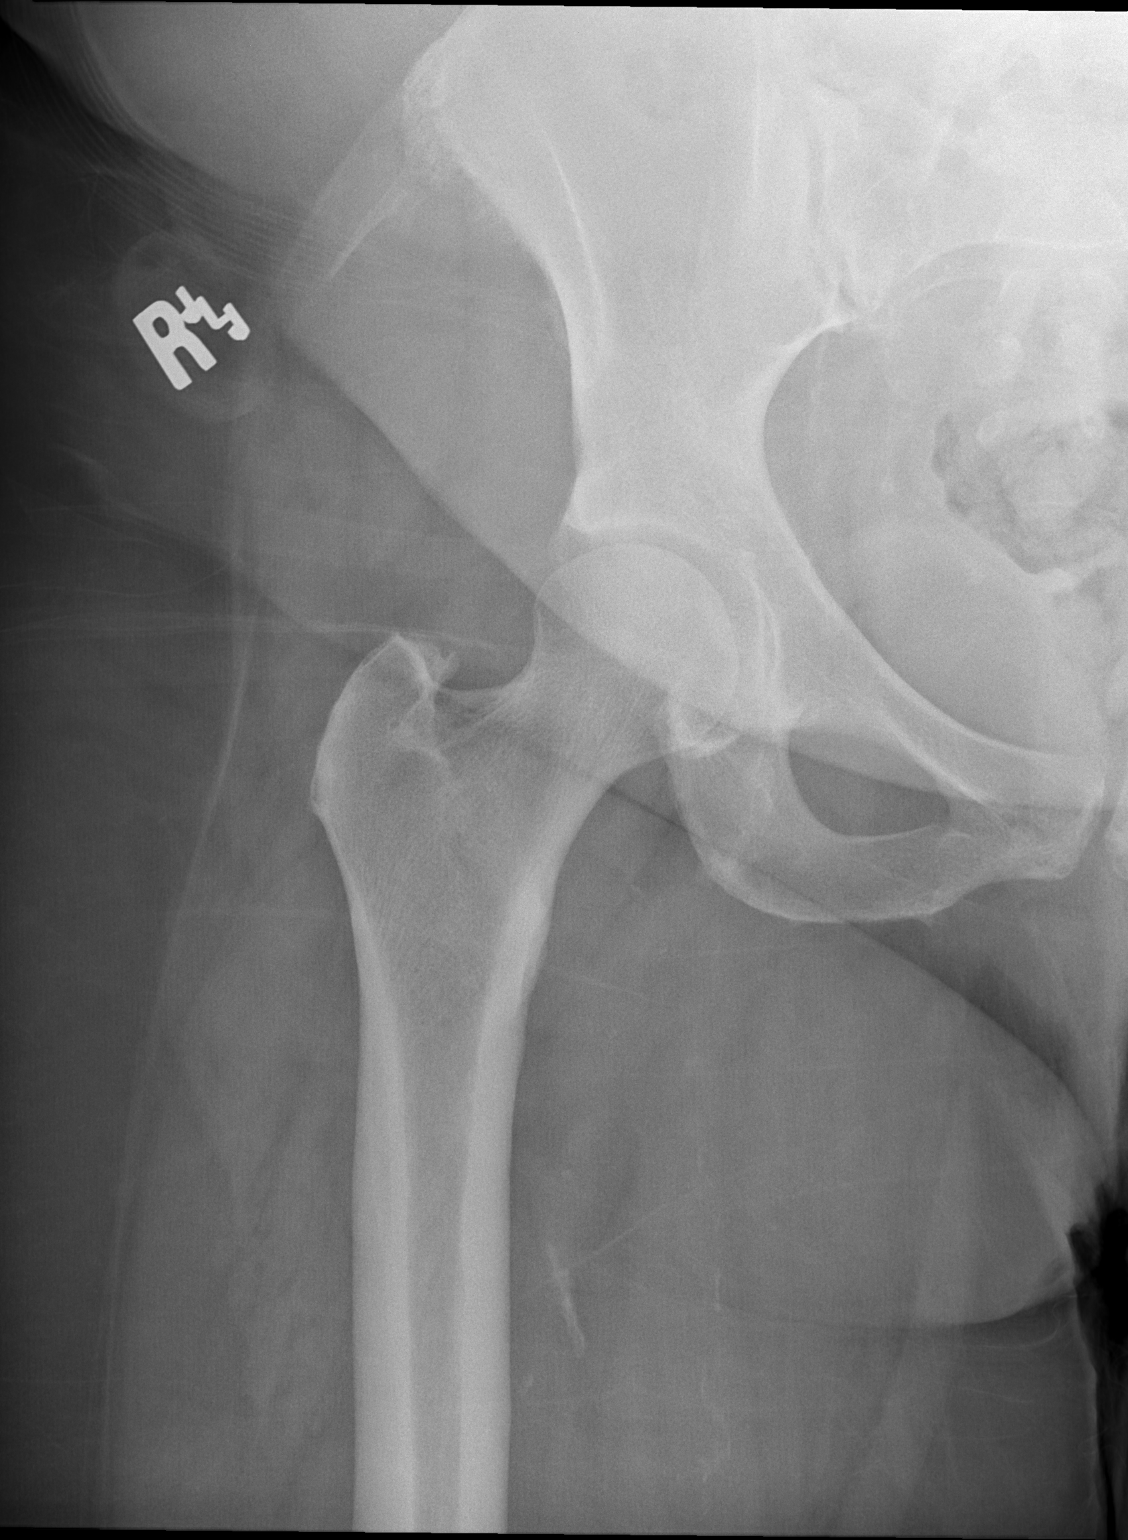

[t hip frog leg right]
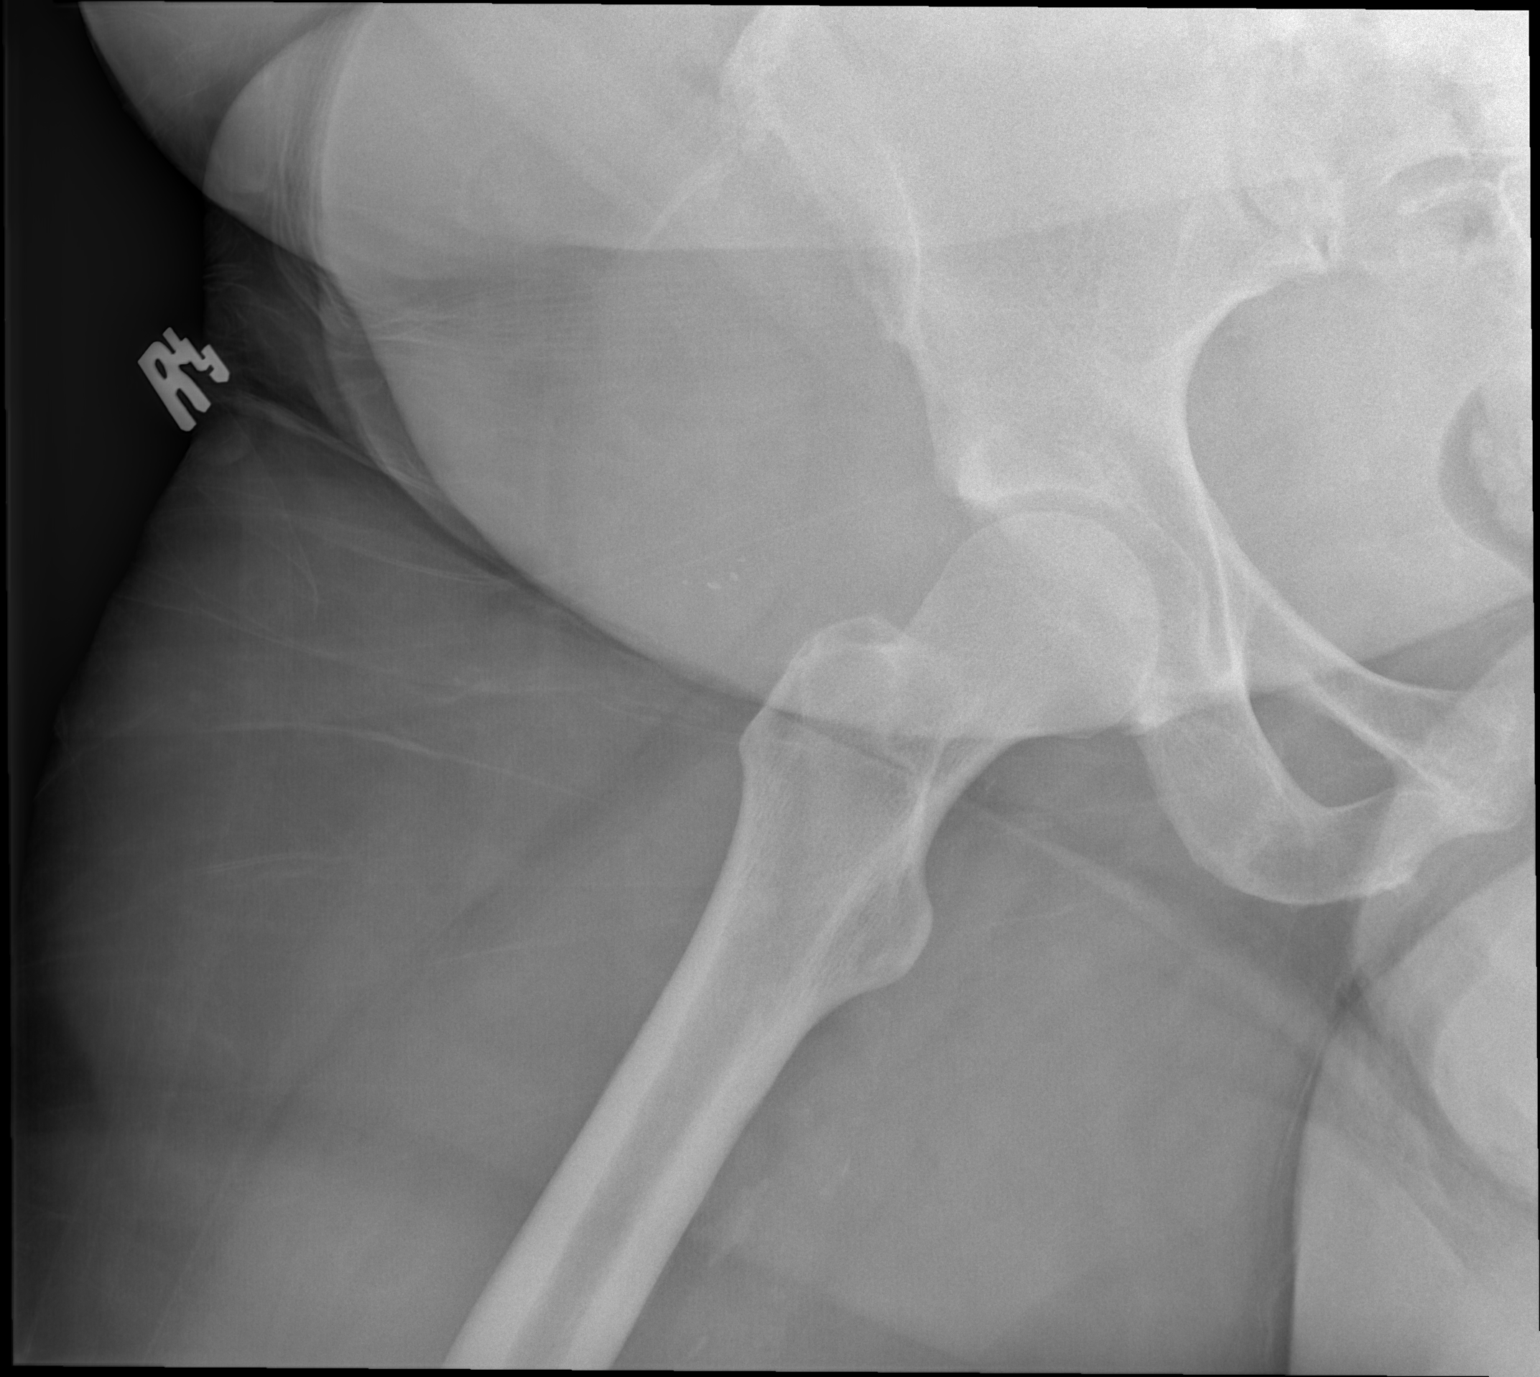

[3 of 3 positions shown; findings below may reference images not displayed]

FINDINGS: Lucency overlies the LEFT hemi sacrum, unclear whether this is due
to bowel gas or sacral trauma/fracture.

No additional signs of fracture about the pelvis. Enthesopathy along
the anterior superior iliac spine bilaterally.

Hips appear located on AP view.

RIGHT hip is located without sign of fracture or dislocation.
IMPRESSION: Lucency overlies the LEFT hemi sacrum, unclear whether this is due
to bowel gas or sacral trauma/fracture. CT may be helpful for
further evaluation. No signs of fracture or dislocation of the RIGHT
hip.

## 2020-07-17 IMAGING — CR DG SHOULDER 2+V*R*
2 series · 2 of 2 positions shown · non-contrast
Comparison: None

CLINICAL DATA: Fall, hip knee and shoulder pain

EXAM:
RIGHT SHOULDER - 2+ VIEW

[t shoulder internal right]
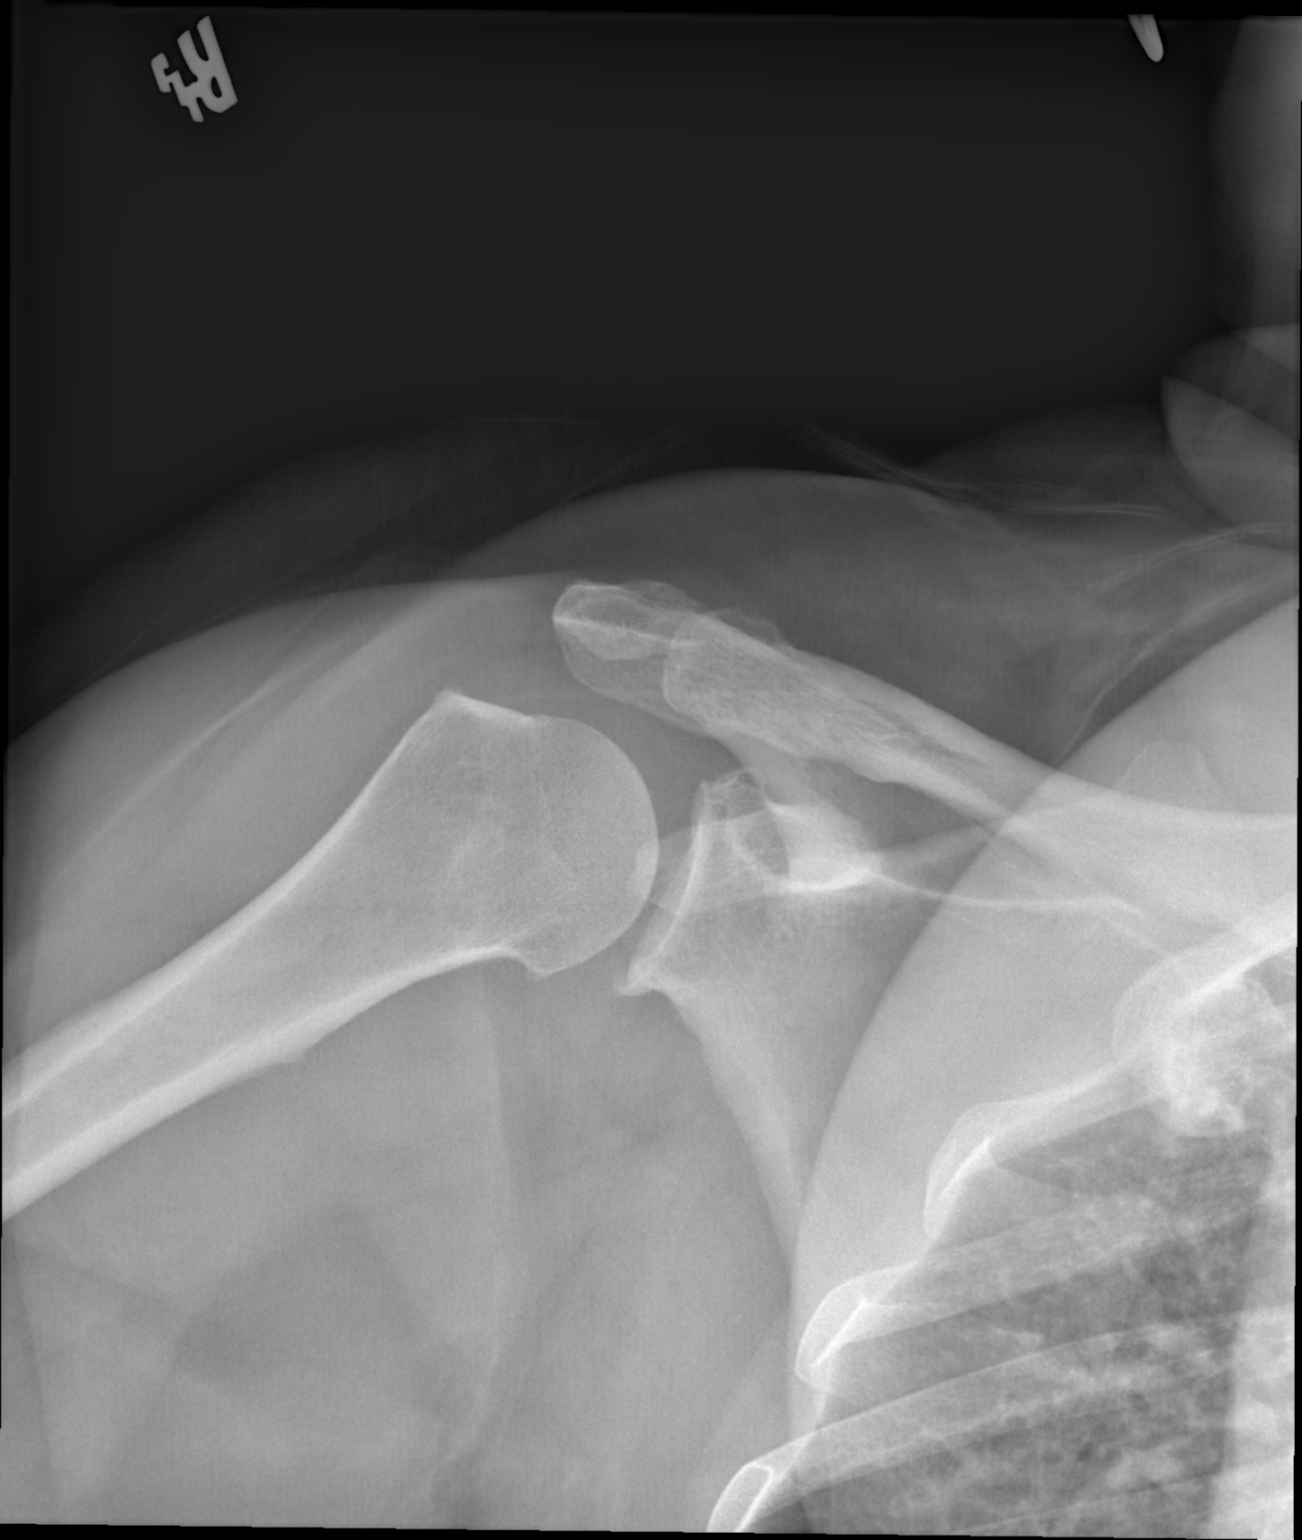

[t shoulder y-view right]
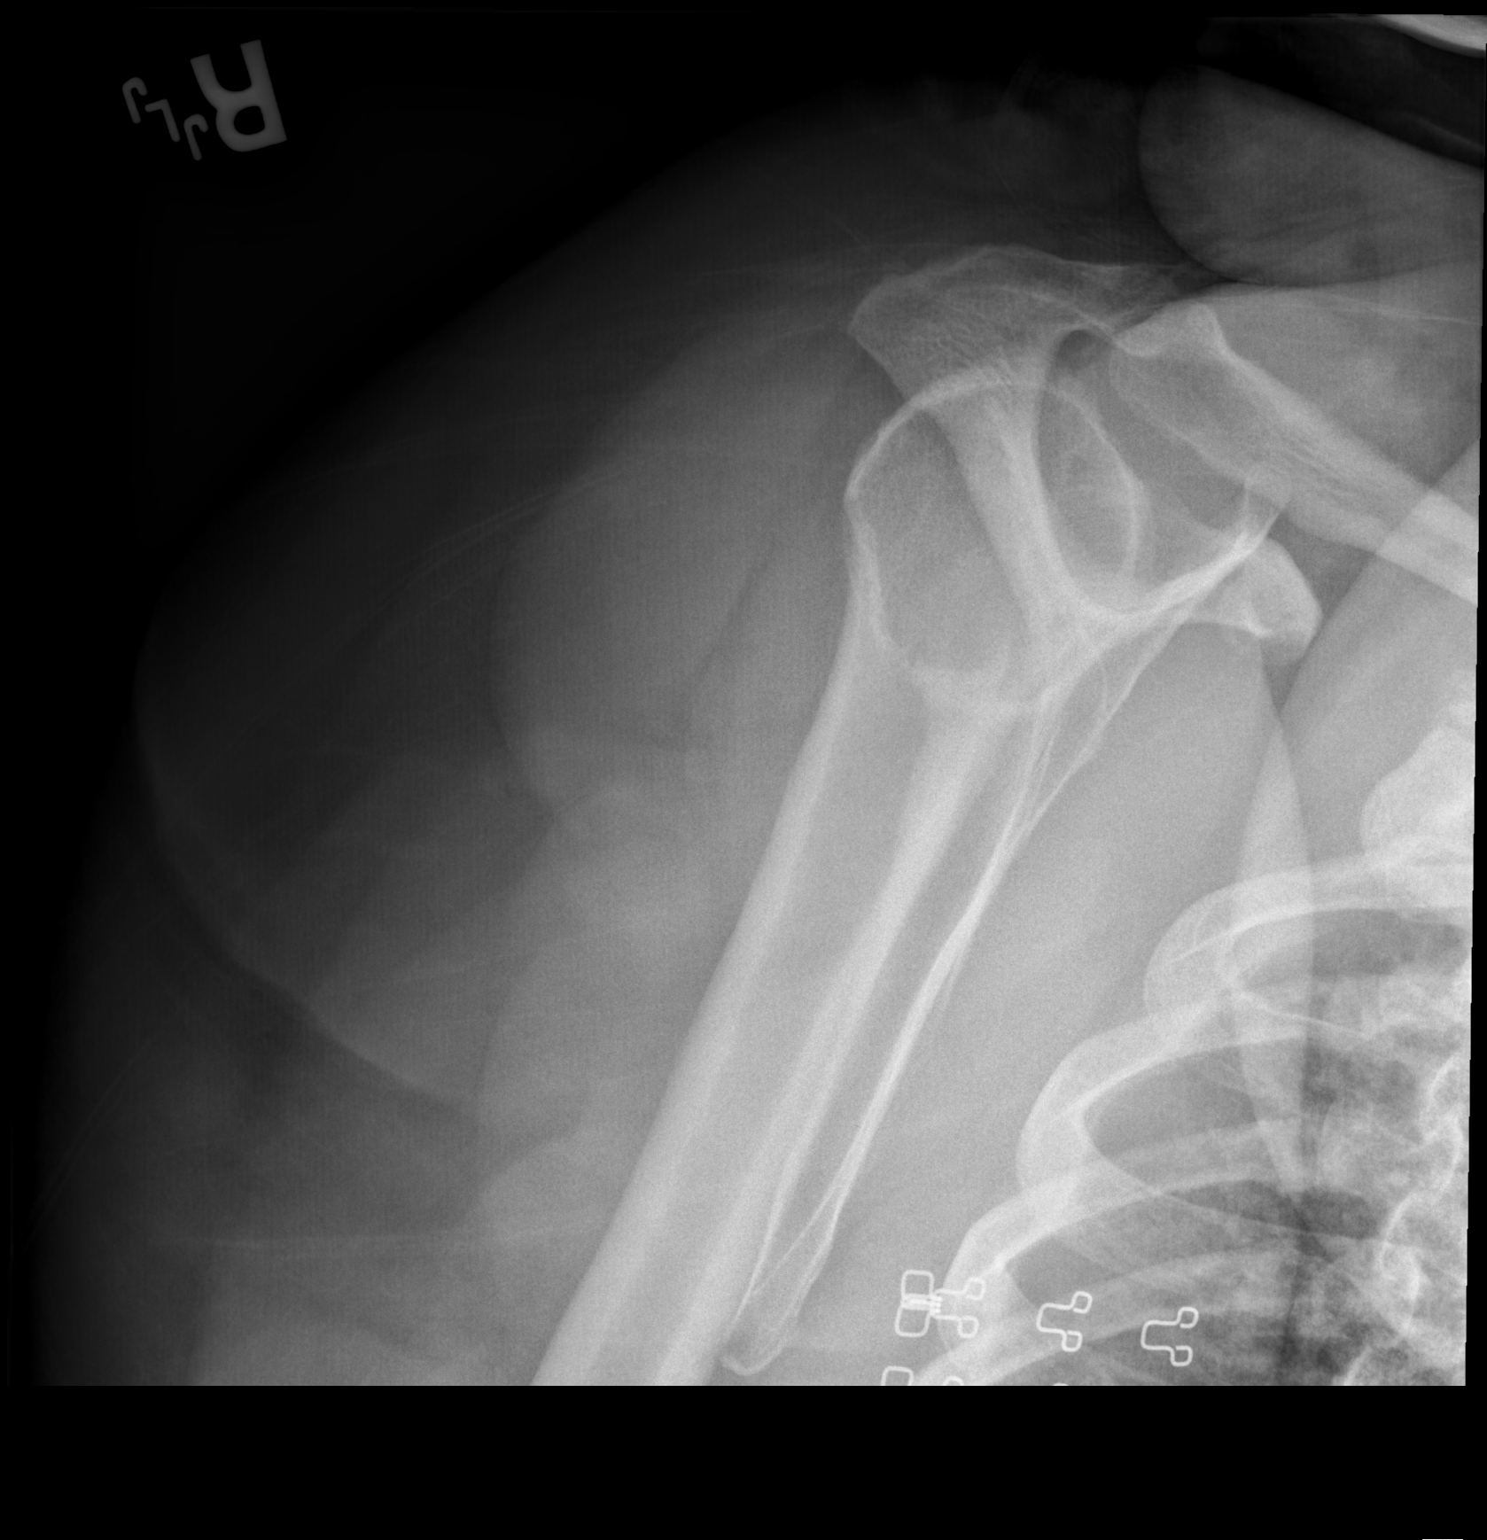

[2 of 2 positions shown; findings below may reference images not displayed]

FINDINGS: Two views of the RIGHT shoulder show no sign of fracture or
dislocation. Soft tissues are unremarkable. Mild glenohumeral
degenerative changes.
IMPRESSION: Mild glenohumeral degenerative changes without fracture.
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
## 2020-07-17 MED ORDER — OXYCODONE-ACETAMINOPHEN 5-325 MG PO TABS
1.0000 | ORAL_TABLET | ORAL | Status: DC | PRN
  Administered 2020-07-17: 1 via ORAL
  Filled 2020-07-17: qty 1

## 2020-07-17 MED ORDER — IBUPROFEN 200 MG PO TABS
400.0000 mg | ORAL_TABLET | Freq: Once | ORAL | Status: AC | PRN
  Administered 2020-07-17: 400 mg via ORAL
  Filled 2020-07-17: qty 2

## 2020-07-17 MED ORDER — CYCLOBENZAPRINE HCL 10 MG PO TABS
5.0000 mg | ORAL_TABLET | Freq: Once | ORAL | Status: AC
  Administered 2020-07-17: 5 mg via ORAL
  Filled 2020-07-17: qty 1

## 2020-07-17 MED ORDER — CYCLOBENZAPRINE HCL 10 MG PO TABS: 10.0000 mg | ORAL_TABLET | Freq: Two times a day (BID) | ORAL | 0 refills | Status: DC | PRN

## 2020-07-17 MED ORDER — OXYCODONE HCL 5 MG PO TABS
5.0000 mg | ORAL_TABLET | Freq: Once | ORAL | Status: AC
  Administered 2020-07-17: 5 mg via ORAL
  Filled 2020-07-17: qty 1
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
## 2020-07-17 NOTE — ED Triage Notes (Signed)
[facility]. Right knee pain, hip pain, and shoulder pain.

## 2020-08-07 ENCOUNTER — Ambulatory Visit (HOSPITAL_COMMUNITY)
Admission: EM | Admit: 2020-08-07 | Discharge: 2020-08-08 | Disposition: A | Discharge status: Home / Self Care | Payer: No Payment, Other | Attending: Family | Admitting: Family

## 2020-08-07 ENCOUNTER — Other Ambulatory Visit: Payer: Self-pay

## 2020-08-07 DIAGNOSIS — F332 Major depressive disorder, recurrent severe without psychotic features: Secondary | ICD-10-CM | POA: Insufficient documentation

## 2020-08-07 DIAGNOSIS — F4321 Adjustment disorder with depressed mood: Secondary | ICD-10-CM | POA: Insufficient documentation

## 2020-08-07 DIAGNOSIS — Z20822 Contact with and (suspected) exposure to covid-19: Secondary | ICD-10-CM | POA: Insufficient documentation

## 2020-08-07 DIAGNOSIS — F1490 Cocaine use, unspecified, uncomplicated: Secondary | ICD-10-CM | POA: Insufficient documentation

## 2020-08-07 DIAGNOSIS — R45851 Suicidal ideations: Secondary | ICD-10-CM | POA: Insufficient documentation

## 2020-08-07 DIAGNOSIS — F339 Major depressive disorder, recurrent, unspecified: Secondary | ICD-10-CM | POA: Insufficient documentation

## 2020-08-07 DIAGNOSIS — Z9151 Personal history of suicidal behavior: Secondary | ICD-10-CM | POA: Insufficient documentation

## 2020-08-07 LAB — POCT URINE DRUG SCREEN - MANUAL ENTRY (I-SCREEN)
POC Amphetamine UR: NOT DETECTED
POC Buprenorphine (BUP): NOT DETECTED
POC Cocaine UR: POSITIVE — AB
POC Marijuana UR: POSITIVE — AB
POC Methadone UR: NOT DETECTED
POC Methamphetamine UR: NOT DETECTED
POC Morphine: NOT DETECTED
POC Oxazepam (BZO): NOT DETECTED
POC Oxycodone UR: NOT DETECTED
POC Secobarbital (BAR): NOT DETECTED

## 2020-08-07 LAB — RESPIRATORY PANEL BY RT PCR (FLU A&B, COVID)
Influenza A by PCR: NEGATIVE
Influenza B by PCR: NEGATIVE
SARS Coronavirus 2 by RT PCR: NEGATIVE

## 2020-08-07 LAB — CBC WITH DIFFERENTIAL/PLATELET
Abs Immature Granulocytes: 0.02 10*3/uL (ref 0.00–0.07)
Basophils Absolute: 0.1 10*3/uL (ref 0.0–0.1)
Basophils Relative: 1 %
Eosinophils Absolute: 0.2 10*3/uL (ref 0.0–0.5)
Eosinophils Relative: 3 %
HCT: 45.2 % (ref 36.0–46.0)
Hemoglobin: 14.4 g/dL (ref 12.0–15.0)
Immature Granulocytes: 0 %
Lymphocytes Relative: 31 %
Lymphs Abs: 2.8 10*3/uL (ref 0.7–4.0)
MCH: 28.9 pg (ref 26.0–34.0)
MCHC: 31.9 g/dL (ref 30.0–36.0)
MCV: 90.8 fL (ref 80.0–100.0)
Monocytes Absolute: 0.6 10*3/uL (ref 0.1–1.0)
Monocytes Relative: 7 %
Neutro Abs: 5.1 10*3/uL (ref 1.7–7.7)
Neutrophils Relative %: 58 %
Platelets: 254 10*3/uL (ref 150–400)
RBC: 4.98 MIL/uL (ref 3.87–5.11)
RDW: 13.6 % (ref 11.5–15.5)
WBC: 8.8 10*3/uL (ref 4.0–10.5)
nRBC: 0 % (ref 0.0–0.2)

## 2020-08-07 LAB — COMPREHENSIVE METABOLIC PANEL
ALT: 16 U/L (ref 0–44)
AST: 22 U/L (ref 15–41)
Albumin: 3.8 g/dL (ref 3.5–5.0)
Alkaline Phosphatase: 68 U/L (ref 38–126)
Anion gap: 14 (ref 5–15)
BUN: 11 mg/dL (ref 6–20)
CO2: 22 mmol/L (ref 22–32)
Calcium: 9.5 mg/dL (ref 8.9–10.3)
Chloride: 102 mmol/L (ref 98–111)
Creatinine, Ser: 1.06 mg/dL — ABNORMAL HIGH (ref 0.44–1.00)
GFR calc Af Amer: >60 mL/min (ref >60)
GFR calc non Af Amer: >60 mL/min (ref >60)
Glucose, Bld: 109 mg/dL — ABNORMAL HIGH (ref 70–99)
Potassium: 3.6 mmol/L (ref 3.5–5.1)
Sodium: 138 mmol/L (ref 135–145)
Total Bilirubin: 1.1 mg/dL (ref 0.3–1.2)
Total Protein: 7.4 g/dL (ref 6.5–8.1)

## 2020-08-07 LAB — POCT URINALYSIS DIP (DEVICE)
Bilirubin Urine: NEGATIVE
Glucose, UA: NEGATIVE mg/dL
Hgb urine dipstick: NEGATIVE
Ketones, ur: NEGATIVE mg/dL
Nitrite: NEGATIVE
Protein, ur: NEGATIVE mg/dL
Specific Gravity, Urine: >=1.03 (ref 1.005–1.030)
Urobilinogen, UA: 0.2 mg/dL (ref 0.0–1.0)
pH: 6 (ref 5.0–8.0)

## 2020-08-07 LAB — LIPID PANEL
Cholesterol: 251 mg/dL — ABNORMAL HIGH (ref 0–200)
HDL: 60 mg/dL (ref >40)
LDL Cholesterol: 171 mg/dL — ABNORMAL HIGH (ref 0–99)
Total CHOL/HDL Ratio: 4.2 RATIO
Triglycerides: 100 mg/dL (ref <150)
VLDL: 20 mg/dL (ref 0–40)

## 2020-08-07 LAB — HEMOGLOBIN A1C
Hgb A1c MFr Bld: 6.2 % — ABNORMAL HIGH (ref 4.8–5.6)
Mean Plasma Glucose: 131.24 mg/dL

## 2020-08-07 LAB — POC SARS CORONAVIRUS 2 AG: SARS Coronavirus 2 Ag: NEGATIVE

## 2020-08-07 LAB — TSH: TSH: 0.707 u[IU]/mL (ref 0.350–4.500)

## 2020-08-07 LAB — POCT PREGNANCY, URINE: Preg Test, Ur: NEGATIVE

## 2020-08-07 LAB — POC SARS CORONAVIRUS 2 AG -  ED: SARS Coronavirus 2 Ag: NEGATIVE

## 2020-08-07 LAB — ACETAMINOPHEN LEVEL: Acetaminophen (Tylenol), Serum: <10 ug/mL — ABNORMAL LOW (ref 10–30)

## 2020-08-07 LAB — ETHANOL: Alcohol, Ethyl (B): <10 mg/dL (ref <10)

## 2020-08-07 MED ORDER — OLANZAPINE 5 MG PO TBDP
5.0000 mg | ORAL_TABLET | Freq: Once | ORAL | Status: AC | PRN
  Administered 2020-08-07: 5 mg via ORAL
  Filled 2020-08-07: qty 1

## 2020-08-07 MED ORDER — ACETAMINOPHEN 325 MG PO TABS: 650.0000 mg | ORAL_TABLET | Freq: Four times a day (QID) | ORAL | Status: DC | PRN

## 2020-08-07 MED ORDER — FLUOXETINE HCL 10 MG PO CAPS
10.0000 mg | ORAL_CAPSULE | Freq: Every day | ORAL | Status: DC
  Administered 2020-08-07 – 2020-08-08 (×2): 10 mg via ORAL
  Filled 2020-08-07: qty 7
  Filled 2020-08-07 (×2): qty 1
  Filled 2020-08-07: qty 7

## 2020-08-07 MED ORDER — TRIAMTERENE-HCTZ 37.5-25 MG PO TABS
1.0000 | ORAL_TABLET | Freq: Every day | ORAL | Status: DC
  Administered 2020-08-07 – 2020-08-08 (×2): 1 via ORAL
  Filled 2020-08-07: qty 1
  Filled 2020-08-07: qty 7
  Filled 2020-08-07: qty 1

## 2020-08-07 MED ORDER — ALUM & MAG HYDROXIDE-SIMETH 200-200-20 MG/5ML PO SUSP: 30.0000 mL | ORAL | Status: DC | PRN

## 2020-08-07 MED ORDER — MAGNESIUM HYDROXIDE 400 MG/5ML PO SUSP: 30.0000 mL | Freq: Every day | ORAL | Status: DC | PRN

## 2020-08-07 MED ORDER — GABAPENTIN 100 MG PO CAPS
100.0000 mg | ORAL_CAPSULE | Freq: Two times a day (BID) | ORAL | Status: DC
  Administered 2020-08-07 – 2020-08-08 (×2): 100 mg via ORAL
  Filled 2020-08-07: qty 14
  Filled 2020-08-07 (×2): qty 1

## 2020-08-07 NOTE — ED Notes (Signed)
Patient belongings in locker 26 

## 2020-08-07 NOTE — ED Notes (Signed)
Pt sleeping@this time. Will continue to monitor for safety 

## 2020-08-07 NOTE — ED Notes (Signed)
Patient requested meal; given salad, crackers, water

## 2020-08-07 NOTE — ED Notes (Signed)
Patient is alert and verbal. Patient is passive SI and contracts for safety on unit. Patient and skin assessed and skin was intact with one note tattoos. Patient and belongings searched and was found free of contraband. Patient oriented to  Unit and staff. Monitoring continues.

## 2020-08-07 NOTE — BH Assessment (Signed)
Comprehensive Clinical Assessment (CCA) Note  08/07/2020 Cathy Hall Cathy Hall [id number]  Visit Diagnosis:   Major Depressive Disorder, Recurrent, Severe w/o psychotic features; Cocaine Use Disorder; Cannabis Use Disorder  Pt is a 49 year old female who presented to [facility] as a voluntary walk-in with complaint of suicidal ideation with plan, despondency, and other symptoms.  Pt lives in [place] with family, and she is unemployed.  Pt indicated that she does not have an outpatient provider at this time.  Pt reported that she has had depression for significant periods of her life and that she has become increasingly depressed since the murder of her son in 06/11/20and the death of her twin sister in 07-16-2020.  Pt stated that recently she has felt suicidal and that this morning she held a knife to her arm and considered cutting herself.  Pt reported also that she cut herself with a razor blade on the arm and leg last month.  Pt endorsed the following symptoms:  Suicidal ideation with plan; despondency; insomnia; poor appetite; feelings of worthlessness and hopelessness; tearfulness; unresolved grief.  Pt also endorsed daily use of cocaine and weekly use of marijuana.  Pt stated that previously she had been on welbutrin and another medication, and she would like to restart these.  Pt denied homicidal ideation and hallucination.  Pt indicated that she has been treated inpatient for depressive symptoms and substance use at [facility] in 2023/07/17 and September 2020.    During assessment, Pt presented as alert and oriented.  She had good eye contact and was cooperative.  Pt was dressed in street clothes, and she appeared appropriately groomed.  Pt's demeanor was calm.  Mood and affect were depressed.  Speech was soft but otherwise normal in rate and rhythm.  Thought processes were within normal range, and thought content was logical and goal-oriented.  There was no evidence of  delusion.  Pt's memory and concentration were intact.  Insight, judgment, and impulse control were fair.  CCA Screening, Triage and Referral (STR)  Patient Reported Information How did you hear about [place]? Self  Referral name: Cathy Hall  Referral phone number: No data recorded  Whom do you see for routine medical problems? Primary Care  Practice/Facility Name: No data recorded Practice/Facility Phone Number: No data recorded Name of Contact: No data recorded Contact Number: No data recorded Contact Fax Number: No data recorded Prescriber Name: No data recorded Prescriber Address (if known): No data recorded  What Is the Reason for Your Visit/Call Today? Suicidal  How Long Has This Been Causing You Problems? 1-6 months  What Do You Feel Would Help You the Most Today? Therapy;Medication   Have You Recently Been in Any Inpatient Treatment (Hospital/Detox/Crisis Center/28-Day Program)? No (Pt was at [facility] in Aug/Sept 2020)  Name/Location of Program/Hospital:No data recorded How Long Were You There? No data recorded When Were You Discharged? No data recorded  Have You Ever Received Services From [facility] Before? Yes  Who Do You See at [facility]? Pt stated that she has been at [facility]   Have You Recently Had Any Thoughts About Hurting Yourself? Yes  Are You Planning to Commit Suicide/Harm Yourself At This time? Yes   Have you Recently Had Thoughts About Hurting Someone Cathy Hall? No  Explanation: No data recorded  Have You Used Any Alcohol or Drugs in the Past 24 Hours? Yes  How Long Ago Did You Use Drugs or Alcohol? 2300  What Did You Use and How Much? Cocaine (up to 2 grams); 4 joints of THC   Do You Currently Have a Therapist/Psychiatrist? No  Name of Therapist/Psychiatrist: No data recorded  Have You Been Recently Discharged From Any Office Practice or Programs? No  Explanation of Discharge From Practice/Program: No data  recorded    CCA Screening Triage Referral Assessment Type of Contact: Face-to-Face  Is this Initial or Reassessment? No data recorded Date Telepsych consult ordered in [facility]:  No data recorded Time Telepsych consult ordered in [facility]:  No data recorded  Patient Reported Information Reviewed? Yes  Patient Left Without Being Seen? No data recorded Reason for Not Completing Assessment: No data recorded  Collateral Involvement: No data recorded  Does Patient Have a Court Appointed Legal Guardian? No data recorded Name and Contact of Legal Guardian: No data recorded If Minor and Not Living with Parent(s), Who has Custody? No data recorded Is CPS involved or ever been involved? Never  Is APS involved or ever been involved? Never   Patient Determined To Be At Risk for Harm To Self or Others Based on Review of Patient Reported Information or Presenting Complaint? Yes, for Self-Harm  Method: No data recorded Availability of Means: No data recorded Intent: No data recorded Notification Required: No data recorded Additional Information for Danger to Others Potential: No data recorded Additional Comments for Danger to Others Potential: No data recorded Are There Guns or Other Weapons in Your Home? No data recorded Types of Guns/Weapons: No data recorded Are These Weapons Safely Secured?                            No data recorded Who Could Verify You Are Able To Have These Secured: No data recorded Do You Have any Outstanding Charges, Pending Court Dates, Parole/Probation? No data recorded Contacted To Inform of Risk of Harm To Self or Others: No data recorded  Location of Assessment: [facility] Services   Does Patient Present under Involuntary Commitment? No  IVC Papers Initial File Date: No data recorded  [place] of Residence: [place]   Patient Currently Receiving the Following Services: Not Receiving Services   Determination of Need: Emergent (2 hours)   Options For  Referral: No data recorded    CCA Biopsychosocial  Intake/Chief Complaint:  CCA Intake With Chief Complaint CCA Part Two Date: 08/07/20 Chief Complaint/Presenting Problem: Pt is suicidal with plan to cut herself Patient's Currently Reported Symptoms/Problems: Suicidal ideation with plan to cut; despondency; insomnia; poor appetite; hopelessness/worthlessness; unresolved grief; substance use Individual's Strengths: Awareness Individual's Preferences: Inpatient Type of Services Patient Feels Are Needed: Inpatient  Mental Health Symptoms Depression:  Depression: Change in energy/activity, Hopelessness, Increase/decrease in appetite, Sleep (too much or little), Tearfulness, Worthlessness, Duration of symptoms greater than two weeks  Mania:  Mania: None  Anxiety:   Anxiety: None  Psychosis:  Psychosis: None  Trauma:  Trauma: Avoids reminders of event  Obsessions:  Obsessions: None  Compulsions:  Compulsions: None  Inattention:  Inattention: None  Hyperactivity/Impulsivity:  Hyperactivity/Impulsivity: N/A  Oppositional/Defiant Behaviors:     Emotional Irregularity:  Emotional Irregularity: Recurrent suicidal behaviors/gestures/threats  Other Mood/Personality Symptoms:      Mental Status Exam Appearance and self-care  Stature:  Stature: Average  Weight:  Weight: Overweight  Clothing:  Clothing: Casual  Grooming:  Grooming: Normal  Cosmetic use:  Cosmetic Use: Age appropriate  Posture/gait:  Posture/Gait: Normal  Motor activity:  Motor Activity: Not  Remarkable  Sensorium  Attention:  Attention: Normal  Concentration:  Concentration: Focuses on irrelevancies  Orientation:  Orientation: Object, Person, Place, Situation, Time, X5  Recall/memory:  Recall/Memory: Normal  Affect and Mood  Affect:  Affect: Depressed  Mood:  Mood: Depressed  Relating  Eye contact:  Eye Contact: Normal  Facial expression:  Facial Expression: Depressed  Attitude toward examiner:  Attitude Toward  Examiner: Cooperative  Thought and Language  Speech flow: Speech Flow: Clear and Coherent, Soft  Thought content:  Thought Content: Appropriate to Mood and Circumstances  Preoccupation:  Preoccupations: Suicide  Hallucinations:  Hallucinations: None  Organization:     Company secretary of Knowledge:  Fund of Knowledge: Average  Intelligence:  Intelligence: Average  Abstraction:  Abstraction: Normal  Judgement:  Judgement: Common-sensical  Reality Testing:  Reality Testing: Adequate  Insight:  Insight: Fair  Decision Making:  Decision Making: Impulsive  Social Functioning  Social Maturity:  Social Maturity: Responsible  Social Judgement:  Social Judgement: Normal  Stress  Stressors:  Stressors: Grief/losses  Coping Ability:  Coping Ability: Horticulturist, commercial Deficits:  Skill Deficits: Self-control  Supports:  Supports: Family     Religion:    Leisure/Recreation:    Exercise/Diet: Exercise/Diet Do You Exercise?: No Do You Follow a Special Diet?: No Do You Have Any Trouble Sleeping?: Yes Explanation of Sleeping Difficulties: Chronic insomnia   CCA Employment/Education  Employment/Work Situation: Employment / Work Situation Employment situation: Unemployed Patient's job has been impacted by current illness: Yes Describe how patient's job has been impacted: Pt is skilled as a Lawyer, 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 Has patient ever been in the [employer]?: No  Education: Education Is Patient Currently Attending School?: No   CCA Family/Childhood History  Family and Relationship History: Family history Marital status: Divorced Are you sexually active?: No Does patient have children?: Yes How many children?: 2 How is patient's relationship with their children?: One son was murdered in 2020, per Pt's report  Childhood History:  Childhood History Does patient have siblings?: Yes  Child/Adolescent Assessment:     CCA Substance Use  Alcohol/Drug  Use: Alcohol / Drug Use Pain Medications: See MAR Prescriptions: See MAR Over the Counter: See MAR History of alcohol / drug use?: Yes Substance #1 Name of Substance 1: Marijuana 1 - Amount (size/oz): Up to four joints 1 - Frequency: Weekly 1 - Duration: Ongoing 1 - Last Use / Amount: 08/06/2020 Substance #2 Name of Substance 2: Cocaine 2 - Amount (size/oz): up to 2 grams 2 - Frequency: Daily 2 - Duration: Ongoing 2 - Last Use / Amount: 08/06/2020                     ASAM's:  Six Dimensions of Multidimensional Assessment  Dimension 1:  Acute Intoxication and/or Withdrawal Potential:      Dimension 2:  Biomedical Conditions and Complications:      Dimension 3:  Emotional, Behavioral, or Cognitive Conditions and Complications:     Dimension 4:  Readiness to Change:     Dimension 5:  Relapse, Continued use, or Continued Problem Potential:     Dimension 6:  Recovery/Living Environment:     ASAM Severity Score:    ASAM Recommended Level of Treatment:     Substance use Disorder (SUD)    Recommendations for Services/Supports/Treatments: Recommendations for Services/Supports/Treatments Recommendations For Services/Supports/Treatments: Inpatient Hospitalization  DSM5 Diagnoses: Patient Active Problem List   Diagnosis Date Noted  . Severe episode of recurrent  major depressive disorder, without psychotic features [facility])     Patient Centered Plan: Patient is on the following Treatment Plan(s):     Referrals to Alternative Service(s): Referred to Alternative Service(s):   Place:   Date:   Time:    Referred to Alternative Service(s):   Place:   Date:   Time:    Referred to Alternative Service(s):   Place:   Date:   Time:    Referred to Alternative Service(s):   Place:   Date:   Time:     DISPOSITION:  Consulted with Ander Slade, NP, who determined that Pt meets inpatient.  Pt to be admitted to [facility] and from there placed in inpatient facility. Dorris Fetch Saman Umstead

## 2020-08-07 NOTE — ED Notes (Signed)
Pt c/o of SI w/o plan, depression.After talking with pt she is resting and had a snack

## 2020-08-07 NOTE — ED Notes (Signed)
Patient resting in bed with eyes closed. Respirations even and non labored. No distress noted. 

## 2020-08-07 NOTE — ED Notes (Signed)
Apple sauce provided

## 2020-08-07 NOTE — ED Notes (Signed)
Lunch given: sandwich and chips

## 2020-08-07 NOTE — ED Provider Notes (Signed)
Behavioral Health Admission H&P [facility] & OBS)  Date: 08/07/20 Patient Name: Cathy Hall MRN: [id number] Chief Complaint:  Chief Complaint  Patient presents with  . Depression    Accompanied by suicidal ideation  . Addiction Problem    THC and Cocaine use   Chief Complaint/Presenting Problem: Pt is suicidal with plan to cut herself  Diagnoses:  Final diagnoses:  None    HPI: Pt is a 49 year old female who presented to [facility] as a voluntary walk-in with complaint of suicidal ideation with plan, despondency, and other symptoms.  Pt lives in [place] with family, and she is unemployed.  Pt indicated that she does not have an outpatient provider at this time.  Pt reported that she has had depression for significant periods of her life and that she has become increasingly depressed since the murder of her son in May 24, 2020and the death of her twin sister in 06/28/2020.  Pt stated that recently she has felt suicidal and that this morning she held a knife to her arm and considered cutting herself.  Pt reported also that she cut herself with a razor blade on the arm and leg last month.  Pt endorsed the following symptoms:  Suicidal ideation with plan; despondency; insomnia; poor appetite; feelings of worthlessness and hopelessness; tearfulness; unresolved grief.  Pt also endorsed daily use of cocaine and weekly use of marijuana.  Pt stated that previously she had been on welbutrin and another medication, and she would like to restart these.  Pt denied homicidal ideation and hallucination.  Pt indicated that she has been treated inpatient for depressive symptoms and substance use at [name] in Jun 29, 2023 and September 2020.    During assessment, Pt presented as alert and oriented.  She had good eye contact and was cooperative.  Pt was dressed in street clothes, and she appeared appropriately groomed.  Pt's demeanor was calm.  Mood and affect were depressed.  Speech  was soft but otherwise normal in rate and rhythm.  Thought processes were within normal range, and thought content was logical and goal-oriented.  There was no evidence of delusion.  Pt's memory and concentration were intact.  Insight, judgment, and impulse control were fair.  PHQ 2-9:    ED from 08/07/2020 in [facility]  Thoughts that you would be better off dead, or of hurting yourself in some way Nearly every day  PHQ-9 Total Score 22        Total Time spent with patient: 30 minutes  Musculoskeletal  Strength & Muscle Tone: within normal limits Gait & Station: normal Patient leans: N/A  Psychiatric Specialty Exam  Presentation General Appearance: Casual  Eye Contact:Fair  Speech:Clear and Coherent;Normal Rate  Speech Volume:Normal  Handedness:Right   Mood and Affect  Mood:Anxious;Dysphoric;Hopeless;Worthless  Affect:Congruent;Appropriate   Thought Process  Thought Processes:Coherent;Goal Directed  Descriptions of Associations:Intact  Orientation:Full (Time, Place and Person)  Thought Content:Logical;Rumination  Hallucinations:Hallucinations: None  Ideas of Reference:None  Suicidal Thoughts:Suicidal Thoughts: Yes, Active SI Active Intent and/or Plan: With Intent;With Plan;Without Access to Means  Homicidal Thoughts:Homicidal Thoughts: No   Sensorium  Memory:Recent Fair;Remote Fair;Immediate Poor  Judgment:Poor  Insight:Lacking   Executive Functions  Concentration:Poor  Attention Span:Fair  Recall:Fair  Fund of Knowledge:Poor  Language:Fair   Psychomotor Activity  Psychomotor Activity:Psychomotor Activity: Psychomotor Retardation;Restlessness   Assets  Assets:Desire for Improvement;Social Support;Physical Health;Leisure Time   Sleep  Sleep:Sleep: Fair   Physical Exam ROS  Blood pressure Marland Kitchen)  138/124, pulse 99, temperature 98 F (36.7 C), temperature source Temporal, resp. rate 18, height 5\' 1"   (1.549 m), weight 214 lb (97.1 kg), SpO2 100 %. Body mass index is 40.43 kg/m.  Past Psychiatric History:      Is the patient at risk to self? Yes  Has the patient been a risk to self in the past 6 months? No .    Has the patient been a risk to self within the distant past? Yes   Is the patient a risk to others? No   Has the patient been a risk to others in the past 6 months? No   Has the patient been a risk to others within the distant past? No   Past Medical History:  Past Medical History:  Diagnosis Date  . Anxiety   . Asthma   . COPD (chronic obstructive pulmonary disease) (HCC)   . Depression   . Hypertension   . Major depression   . PTSD (post-traumatic stress disorder)     Past Surgical History:  Procedure Laterality Date  . ECTOPIC PREGNANCY SURGERY      Family History: No family history on file.  Social History:  Social History   Socioeconomic History  . Marital status: Unknown    Spouse name: Not on file  . Number of children: Not on file  . Years of education: Not on file  . Highest education level: Not on file  Occupational History  . Not on file  Tobacco Use  . Smoking status: Current Every Day Smoker    Types: Cigarettes  . Smokeless tobacco: Never Used  Vaping Use  . Vaping Use: Never used  Substance and Sexual Activity  . Alcohol use: Yes  . Drug use: Not Currently  . Sexual activity: Not on file  Other Topics Concern  . Not on file  Social History Narrative   ** Merged History Encounter **       Social Determinants of Health   Financial Resource Strain:   . Difficulty of Paying Living Expenses: Not on file  Food Insecurity:   . Worried About Programme researcher, broadcasting/film/video in the Last Year: Not on file  . Ran Out of Food in the Last Year: Not on file  Transportation Needs:   . Lack of Transportation (Medical): Not on file  . Lack of Transportation (Non-Medical): Not on file  Physical Activity:   . Days of Exercise per Week: Not on file  .  Minutes of Exercise per Session: Not on file  Stress:   . Feeling of Stress : Not on file  Social Connections:   . Frequency of Communication with Friends and Family: Not on file  . Frequency of Social Gatherings with Friends and Family: Not on file  . Attends Religious Services: Not on file  . Active Member of Clubs or Organizations: Not on file  . Attends Banker Meetings: Not on file  . Marital Status: Not on file  Intimate Partner Violence:   . Fear of Current or Ex-Partner: Not on file  . Emotionally Abused: Not on file  . Physically Abused: Not on file  . Sexually Abused: Not on file    SDOH:  SDOH Screenings   Alcohol Screen:   . Last Alcohol Screening Score (AUDIT): Not on file  Depression (PHQ2-9): Medium Risk  . PHQ-2 Score: 22  Financial Resource Strain:   . Difficulty of Paying Living Expenses: Not on file  Food Insecurity:   .  Worried About Programme researcher, broadcasting/film/video in the Last Year: Not on file  . Ran Out of Food in the Last Year: Not on file  Housing:   . Last Housing Risk Score: Not on file  Physical Activity:   . Days of Exercise per Week: Not on file  . Minutes of Exercise per Session: Not on file  Social Connections:   . Frequency of Communication with Friends and Family: Not on file  . Frequency of Social Gatherings with Friends and Family: Not on file  . Attends Religious Services: Not on file  . Active Member of Clubs or Organizations: Not on file  . Attends Banker Meetings: Not on file  . Marital Status: Not on file  Stress:   . Feeling of Stress : Not on file  Tobacco Use: High Risk  . Smoking Tobacco Use: Current Every Day Smoker  . Smokeless Tobacco Use: Never Used  Transportation Needs:   . Freight forwarder (Medical): Not on file  . Lack of Transportation (Non-Medical): Not on file    Last Labs:  Admission on 08/07/2020  Component Date Value Ref Range Status  . SARS Coronavirus 2 Ag 08/07/2020 Negative   Negative Final  . POC Amphetamine UR 08/07/2020 None Detected  None Detected Final  . POC Secobarbital (BAR) 08/07/2020 None Detected  None Detected Final  . POC Buprenorphine (BUP) 08/07/2020 None Detected  None Detected Final  . POC Oxazepam (BZO) 08/07/2020 None Detected  None Detected Final  . POC Cocaine UR 08/07/2020 Positive* None Detected Final  . POC Methamphetamine UR 08/07/2020 None Detected  None Detected Final  . POC Morphine 08/07/2020 None Detected  None Detected Final  . POC Oxycodone UR 08/07/2020 None Detected  None Detected Final  . POC Methadone UR 08/07/2020 None Detected  None Detected Final  . POC Marijuana UR 08/07/2020 Positive* None Detected Final  . SARS Coronavirus 2 Ag 08/07/2020 NEGATIVE  NEGATIVE Final   Comment: (NOTE) SARS-CoV-2 antigen NOT DETECTED.   Negative results are presumptive.  Negative results do not preclude SARS-CoV-2 infection and should not be used as the sole basis for treatment or other patient management decisions, including infection  control decisions, particularly in the presence of clinical signs and  symptoms consistent with COVID-19, or in those who have been in contact with the virus.  Negative results must be combined with clinical observations, patient history, and epidemiological information. The expected result is Negative.  Fact Sheet for Patients: https://sanders-williams.net/  Fact Sheet for Healthcare Providers: https://martinez.com/   This test is not yet approved or cleared by the [place] FDA and  has been authorized for detection and/or diagnosis of SARS-CoV-2 by FDA under an Emergency Use Authorization (EUA).  This EUA will remain in effect (meaning this test can be used) for the duration of  the C                          OVID-19 declaration under Section 564(b)(1) of the Act, 21 U.S.C. section 360bbb-3(b)(1), unless the authorization is terminated or revoked sooner.     . Glucose, UA 08/07/2020 NEGATIVE  NEGATIVE mg/dL Final  . Bilirubin Urine 08/07/2020 NEGATIVE  NEGATIVE Final  . Ketones, ur 08/07/2020 NEGATIVE  NEGATIVE mg/dL Final  . Specific Gravity, Urine 08/07/2020 >=1.030  1.005 - 1.030 Final  . Hgb urine dipstick 08/07/2020 NEGATIVE  NEGATIVE Final  . pH 08/07/2020 6.0  5.0 - 8.0 Final  .  Protein, ur 08/07/2020 NEGATIVE  NEGATIVE mg/dL Final  . Urobilinogen, UA 08/07/2020 0.2  0.0 - 1.0 mg/dL Final  . Nitrite 85/88/5027 NEGATIVE  NEGATIVE Final  . Glori Luis 08/07/2020 LARGE* NEGATIVE Final   Biochemical Testing Only. Please order routine urinalysis from main lab if confirmatory testing is needed.  . Preg Test, Ur 08/07/2020 NEGATIVE  NEGATIVE Final   Comment:        THE SENSITIVITY OF THIS METHODOLOGY IS >24 mIU/mL     Allergies: Patient has no known allergies.  PTA Medications: (Not in a hospital admission)   Medical Decision Making  Based on my current evaluation she meets criteria for observation status. Labs will be obtained to include COVID testing, UA, UDS, and UPreg. She is able to contract for safety with some hesitancy and preservations. Will continue to monitor. Will start prozac 10mg  po daily and gabapentin 100mg  po BID for withdraw symptoms and agitation. Olanzapine zydis is ordered for prn dose for agitation and psychosis.  She will be discharged with a 30 day rx for fluoxetine and appointment for [facility] outpatient.     Patient with elevated blood pressure readings will start maxzide 37.5/25 po daily. Potassium level was within normal limits. She reports a recent ACL tear in her knee, Tylenol is ordered prn for pain use. She is aware we do not administer controlled substances at this facility.   Urinalysis reveals large amount of leukocytes, she denies any symptoms at this time. May benefit from abx if symptoms present or she develops discomfort. Will beenfit from std testing as well.   She has a headwrap on her that we will  allow her to keep at this time. She is under continuous observation and personally do not feel she would do any harm to herself. She is here voluntary admission.   Recommendations  Based on my evaluation the patient does not appear to have an emergency medical condition.  [facility], FNP 08/07/20  12:09 PM

## 2020-08-07 NOTE — ED Notes (Signed)
Pt given [place]ines as requested and Emesis bag after stating they were nauseated; RN Cecile Sheerer notified

## 2020-08-08 MED ORDER — OLANZAPINE 5 MG PO TBDP
5.0000 mg | ORAL_TABLET | Freq: Every day | ORAL | Status: DC
  Filled 2020-08-08: qty 7

## 2020-08-08 MED ORDER — FLUOXETINE HCL 10 MG PO CAPS
10.0000 mg | ORAL_CAPSULE | Freq: Every day | ORAL | 0 refills | Status: DC
Start: 2020-08-09 — End: 2020-08-09

## 2020-08-08 MED ORDER — GABAPENTIN 100 MG PO CAPS: 100.0000 mg | ORAL_CAPSULE | Freq: Two times a day (BID) | ORAL | 0 refills | Status: DC

## 2020-08-08 MED ORDER — OLANZAPINE 5 MG PO TBDP: 5.0000 mg | ORAL_TABLET | Freq: Every day | ORAL | 0 refills | Status: DC

## 2020-08-08 MED ORDER — TRIAMTERENE-HCTZ 37.5-25 MG PO TABS
1.0000 | ORAL_TABLET | Freq: Every day | ORAL | 0 refills | Status: DC
Start: 2020-08-09 — End: 2020-11-09

## 2020-08-08 NOTE — ED Provider Notes (Signed)
[facility]/OBS ASAP Discharge Summary  Date and Time: 08/08/2020 5:10 PM  Name: Cathy Hall  MRN:  [id number]   Discharge Diagnoses:  Final diagnoses:  MDD (major depressive disorder), recurrent episode, with atypical features (HCC)    Subjective: Patient is anxious more than she reports that she is feeling some better today but still feels very depressed.  She reports that her son was murdered back in 03-29-23 and then her twin sibling passed away this summer.  She states it has been difficult to deal with.  She reports that she lives with her deceased son's girlfriend and she also has another son.  She states that she also has a boyfriend.  She reports that she relapsed on cocaine when her son passed away and she had been clean for 16 years.  Patient states that she feels she needs to be restarted on her medications and thinks that possible substance abuse treatment may be very beneficial to her.  At first patient is interested in going to residential substance abuse treatment, however then patient changes her mind and states that she feels that she just needs to do outpatient and that she needs to be there for her other son.  She states that she will be living with her boyfriend so that she has someone close to her and that she would like to have prescription for medications.  She states her boyfriend will be picking her up from the hospital when she discharges.  Stay Summary: Patient is a 49 year old female who presented to the [facility] as a voluntary walk-in with complaint of suicidal ideation with the plan of this policy.  Patient lives in [place] with family she is unemployed.  Patient was admitted to the continuous observation unit for overnight assessment.  Patient reported that her stressors was the murder of her son in 03-29-2023 and the death of her twin sister.  She also reported relapse on cocaine in 03/29/23.  Patient was offered substance abuse treatment but then declined and stated that she would  like to discharge so she can be with her family.  Patient was provided with information about hospice grievance counseling and was also informed of the open Access at the [facility] for outpatient treatment.  Patient was provided with prescriptions for her medications as she was started on Prozac, Zyprexa, gabapentin.  Patient was informed of walk-in hours at the open Access are 10:51 AM Monday through Thursday.  Attempted to give patient samples however there is no one available to provide samples at this time and patient agreed that she would come back tomorrow morning to pick samples up from the [facility] and possibly make it to a open access appointment.  Patient was also informed of the possibility of CD IOP at the [facility].  Total Time spent with patient: 30 minutes  Past Psychiatric History: MDD Past Medical History:  Past Medical History:  Diagnosis Date  . Anxiety   . Asthma   . COPD (chronic obstructive pulmonary disease) (HCC)   . Depression   . Hypertension   . Major depression   . PTSD (post-traumatic stress disorder)     Past Surgical History:  Procedure Laterality Date  . ECTOPIC PREGNANCY SURGERY     Family History: No family history on file. Family Psychiatric History: None reported Social History:  Social History   Substance and Sexual Activity  Alcohol Use Yes     Social History   Substance and Sexual Activity  Drug Use  Not Currently    Social History   Socioeconomic History  . Marital status: Unknown    Spouse name: Not on file  . Number of children: Not on file  . Years of education: Not on file  . Highest education level: Not on file  Occupational History  . Not on file  Tobacco Use  . Smoking status: Current Every Day Smoker    Types: Cigarettes  . Smokeless tobacco: Never Used  Vaping Use  . Vaping Use: Never used  Substance and Sexual Activity  . Alcohol use: Yes  . Drug use: Not Currently  . Sexual activity: Not on file  Other Topics Concern  . Not  on file  Social History Narrative   ** Merged History Encounter **       Social Determinants of Health   Financial Resource Strain:   . Difficulty of Paying Living Expenses: Not on file  Food Insecurity:   . Worried About Programme researcher, broadcasting/film/video in the Last Year: Not on file  . Ran Out of Food in the Last Year: Not on file  Transportation Needs:   . Lack of Transportation (Medical): Not on file  . Lack of Transportation (Non-Medical): Not on file  Physical Activity:   . Days of Exercise per Week: Not on file  . Minutes of Exercise per Session: Not on file  Stress:   . Feeling of Stress : Not on file  Social Connections:   . Frequency of Communication with Friends and Family: Not on file  . Frequency of Social Gatherings with Friends and Family: Not on file  . Attends Religious Services: Not on file  . Active Member of Clubs or Organizations: Not on file  . Attends Banker Meetings: Not on file  . Marital Status: Not on file   SDOH:  SDOH Screenings   Alcohol Screen:   . Last Alcohol Screening Score (AUDIT): Not on file  Depression (PHQ2-9): Medium Risk  . PHQ-2 Score: 22  Financial Resource Strain:   . Difficulty of Paying Living Expenses: Not on file  Food Insecurity:   . Worried About Programme researcher, broadcasting/film/video in the Last Year: Not on file  . Ran Out of Food in the Last Year: Not on file  Housing:   . Last Housing Risk Score: Not on file  Physical Activity:   . Days of Exercise per Week: Not on file  . Minutes of Exercise per Session: Not on file  Social Connections:   . Frequency of Communication with Friends and Family: Not on file  . Frequency of Social Gatherings with Friends and Family: Not on file  . Attends Religious Services: Not on file  . Active Member of Clubs or Organizations: Not on file  . Attends Banker Meetings: Not on file  . Marital Status: Not on file  Stress:   . Feeling of Stress : Not on file  Tobacco Use: High Risk  .  Smoking Tobacco Use: Current Every Day Smoker  . Smokeless Tobacco Use: Never Used  Transportation Needs:   . Freight forwarder (Medical): Not on file  . Lack of Transportation (Non-Medical): Not on file    Has this patient used any form of tobacco in the last 30 days? (Cigarettes, Smokeless Tobacco, Cigars, and/or Pipes) A prescription for an FDA-approved tobacco cessation medication was offered at discharge and the patient refused  Current Medications:  Current Facility-Administered Medications  Medication Dose Route Frequency Provider Last Rate  Last Admin  . acetaminophen (TYLENOL) tablet 650 mg  650 mg Oral Q6H PRN Maryagnes Amos, FNP      . alum & mag hydroxide-simeth (MAALOX/MYLANTA) 200-200-20 MG/5ML suspension 30 mL  30 mL Oral Q4H PRN Rosario Adie, Juel Burrow, FNP      . FLUoxetine (PROZAC) capsule 10 mg  10 mg Oral Daily Maryagnes Amos, FNP   10 mg at 08/08/20 1050  . gabapentin (NEURONTIN) capsule 100 mg  100 mg Oral BID Maryagnes Amos, FNP   100 mg at 08/08/20 1050  . magnesium hydroxide (MILK OF MAGNESIA) suspension 30 mL  30 mL Oral Daily PRN Maryagnes Amos, FNP      . OLANZapine zydis (ZYPREXA) disintegrating tablet 5 mg  5 mg Oral QHS Sui Kasparek B, FNP      . triamterene-hydrochlorothiazide (MAXZIDE-25) 37.5-25 MG per tablet 1 tablet  1 tablet Oral Daily Maryagnes Amos, FNP   1 tablet at 08/08/20 1050   No current outpatient medications on file.    PTA Medications: (Not in a hospital admission)   Musculoskeletal  Strength & Muscle Tone: within normal limits Gait & Station: normal Patient leans: N/A  Psychiatric Specialty Exam  Presentation  General Appearance: Appropriate for Environment;Casual  Eye Contact:Good  Speech:Clear and Coherent;Normal Rate  Speech Volume:Decreased  Handedness:Right   Mood and Affect  Mood:Depressed  Affect:Appropriate;Congruent;Depressed   Thought Process  Thought  Processes:Coherent  Descriptions of Associations:Intact  Orientation:Full (Time, Place and Person)  Thought Content:WDL  Hallucinations:Hallucinations: None;Auditory  Ideas of Reference:None  Suicidal Thoughts:Suicidal Thoughts: No (Recieving the help she needs with cocaine abuse stops the SI) SI Active Intent and/or Plan: With Intent;With Plan;Without Access to Means  Homicidal Thoughts:Homicidal Thoughts: No   Sensorium  Memory:Immediate Good;Recent Good;Remote Good  Judgment:Fair  Insight:Fair   Executive Functions  Concentration:Good  Attention Span:Good  Recall:Good  Fund of Knowledge:Good  Language:Good   Psychomotor Activity  Psychomotor Activity:Psychomotor Activity: Normal   Assets  Assets:Communication Skills;Desire for Improvement;Housing;Physical Health;Social Support;Transportation   Sleep  Sleep:Sleep: Good   Physical Exam  Physical Exam Vitals and nursing note reviewed.  Constitutional:      Appearance: She is well-developed.  HENT:     Head: Normocephalic.  Eyes:     Pupils: Pupils are equal, round, and reactive to light.  Cardiovascular:     Rate and Rhythm: Normal rate.  Pulmonary:     Effort: Pulmonary effort is normal.  Musculoskeletal:        General: Normal range of motion.  Neurological:     Mental Status: She is alert and oriented to person, place, and time.    Review of Systems  Constitutional: Negative.   HENT: Negative.   Eyes: Negative.   Respiratory: Negative.   Cardiovascular: Negative.   Gastrointestinal: Negative.   Genitourinary: Negative.   Musculoskeletal: Negative.   Skin: Negative.   Neurological: Negative.   Endo/Heme/Allergies: Negative.   Psychiatric/Behavioral: Positive for depression and substance abuse. Negative for hallucinations and suicidal ideas. The patient is not nervous/anxious.    Blood pressure (!) 125/91, pulse 78, temperature 97.6 F (36.4 C), temperature source Temporal, resp.  rate 18, height 5\' 1"  (1.549 m), weight 214 lb (97.1 kg), SpO2 98 %. Body mass index is 40.43 kg/m.  Demographic Factors:  Low socioeconomic status  Loss Factors: NA  Historical Factors: NA  Risk Reduction Factors:   Sense of responsibility to family, Living with another person, especially a relative, Positive social support and Positive therapeutic relationship  Continued Clinical Symptoms:  Alcohol/Substance Abuse/Dependencies Previous Psychiatric Diagnoses and Treatments  Cognitive Features That Contribute To Risk:  None    Suicide Risk:  Mild:  Suicidal ideation of limited frequency, intensity, duration, and specificity.  There are no identifiable plans, no associated intent, mild dysphoria and related symptoms, good self-control (both objective and subjective assessment), few other risk factors, and identifiable protective factors, including available and accessible social support.  Plan Of Care/Follow-up recommendations:  Continue activity as tolerated. Continue diet as recommended by your PCP. Ensure to keep all appointments with outpatient providers.  Disposition: Discharge home to boyfriend  Maryfrances Bunnell, FNP 08/08/2020, 5:10 PM

## 2020-08-08 NOTE — Discharge Instructions (Addendum)

## 2020-08-08 NOTE — Progress Notes (Signed)
Cathy Hall received her discharge order, the AVS was reviewed with her and she received her prescriptions. Safe transport was called to take her home. Transportation arrivea at 1800 hrs.

## 2020-08-08 NOTE — ED Notes (Signed)
Pt sleeping @this  time will continue to monitor pt for safety. Breathing even and unlabored

## 2020-08-08 NOTE — Progress Notes (Addendum)
Received Cathy Hall this AM in her chair bed asleep without noted distress. She continued to sleep throughout the day between meals. She spoke with the NP in the early afternoon and informed him she wanted to detox. Later she decided to go home with prescriptions.

## 2020-08-08 NOTE — ED Notes (Signed)
Patient offered breakfast; given orange juice, cereal, muffin

## 2020-08-08 NOTE — ED Notes (Signed)
Patient given meal; sandwich, chips, drink 

## 2020-08-09 ENCOUNTER — Other Ambulatory Visit: Payer: Self-pay

## 2020-08-09 ENCOUNTER — Other Ambulatory Visit (HOSPITAL_COMMUNITY): Payer: Self-pay | Admitting: Psychiatry

## 2020-08-09 ENCOUNTER — Encounter (HOSPITAL_COMMUNITY): Payer: Self-pay | Admitting: Psychiatry

## 2020-08-09 ENCOUNTER — Ambulatory Visit (INDEPENDENT_AMBULATORY_CARE_PROVIDER_SITE_OTHER): Payer: No Payment, Other | Admitting: Psychiatry

## 2020-08-09 DIAGNOSIS — F1220 Cannabis dependence, uncomplicated: Secondary | ICD-10-CM | POA: Diagnosis not present

## 2020-08-09 DIAGNOSIS — F1420 Cocaine dependence, uncomplicated: Secondary | ICD-10-CM

## 2020-08-09 DIAGNOSIS — F331 Major depressive disorder, recurrent, moderate: Secondary | ICD-10-CM | POA: Diagnosis not present

## 2020-08-09 MED ORDER — GABAPENTIN 100 MG PO CAPS: 100.0000 mg | ORAL_CAPSULE | Freq: Two times a day (BID) | ORAL | 1 refills | Status: DC

## 2020-08-09 MED ORDER — OLANZAPINE 5 MG PO TABS: 5.0000 mg | ORAL_TABLET | Freq: Every day | ORAL | 1 refills | Status: DC

## 2020-08-09 MED ORDER — HYDROXYZINE HCL 25 MG PO TABS: 25.0000 mg | ORAL_TABLET | Freq: Three times a day (TID) | ORAL | 1 refills | Status: DC | PRN

## 2020-08-09 MED ORDER — FLUOXETINE HCL 10 MG PO CAPS: 10.0000 mg | ORAL_CAPSULE | Freq: Every day | ORAL | 1 refills | Status: DC

## 2020-08-09 MED FILL — hydrOXYzine HCL 25 MG TABS: 25 | 30 days supply | Qty: 90 | Fill #0

## 2020-08-09 MED FILL — OLANZapine 5 MG TABS: 5 | 30 days supply | Qty: 30 | Fill #0

## 2020-08-09 MED FILL — FLUoxetine HCL 10 MG CAPS: 10 | 30 days supply | Qty: 30 | Fill #0

## 2020-08-09 MED FILL — GABAPENTIN 100 MG CAPSULE: 100 | 30 days supply | Qty: 60 | Fill #0
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
## 2020-09-20 ENCOUNTER — Ambulatory Visit (HOSPITAL_COMMUNITY): Payer: No Payment, Other | Admitting: Psychiatry

## 2020-11-05 ENCOUNTER — Inpatient Hospital Stay (HOSPITAL_COMMUNITY)
Admission: EM | Admit: 2020-11-05 | Discharge: 2020-11-09 | DRG: 064 | Disposition: A | Discharge status: Home / Self Care | Payer: Self-pay | Attending: Internal Medicine | Admitting: Internal Medicine

## 2020-11-05 ENCOUNTER — Emergency Department (HOSPITAL_COMMUNITY): Payer: Self-pay

## 2020-11-05 ENCOUNTER — Encounter (HOSPITAL_COMMUNITY): Payer: Self-pay | Admitting: Radiology

## 2020-11-05 DIAGNOSIS — R29701 NIHSS score 1: Secondary | ICD-10-CM | POA: Diagnosis not present

## 2020-11-05 DIAGNOSIS — Z886 Allergy status to analgesic agent status: Secondary | ICD-10-CM

## 2020-11-05 DIAGNOSIS — F419 Anxiety disorder, unspecified: Secondary | ICD-10-CM | POA: Diagnosis present

## 2020-11-05 DIAGNOSIS — R1013 Epigastric pain: Secondary | ICD-10-CM | POA: Diagnosis present

## 2020-11-05 DIAGNOSIS — F4310 Post-traumatic stress disorder, unspecified: Secondary | ICD-10-CM | POA: Diagnosis present

## 2020-11-05 DIAGNOSIS — Z79899 Other long term (current) drug therapy: Secondary | ICD-10-CM

## 2020-11-05 DIAGNOSIS — F1220 Cannabis dependence, uncomplicated: Secondary | ICD-10-CM | POA: Diagnosis present

## 2020-11-05 DIAGNOSIS — F17210 Nicotine dependence, cigarettes, uncomplicated: Secondary | ICD-10-CM | POA: Diagnosis present

## 2020-11-05 DIAGNOSIS — R471 Dysarthria and anarthria: Secondary | ICD-10-CM | POA: Diagnosis present

## 2020-11-05 DIAGNOSIS — Z20822 Contact with and (suspected) exposure to covid-19: Secondary | ICD-10-CM | POA: Diagnosis present

## 2020-11-05 DIAGNOSIS — R29700 NIHSS score 0: Secondary | ICD-10-CM | POA: Diagnosis not present

## 2020-11-05 DIAGNOSIS — I6381 Other cerebral infarction due to occlusion or stenosis of small artery: Principal | ICD-10-CM | POA: Diagnosis present

## 2020-11-05 DIAGNOSIS — J449 Chronic obstructive pulmonary disease, unspecified: Secondary | ICD-10-CM | POA: Diagnosis present

## 2020-11-05 DIAGNOSIS — E860 Dehydration: Secondary | ICD-10-CM | POA: Diagnosis present

## 2020-11-05 DIAGNOSIS — R29810 Facial weakness: Secondary | ICD-10-CM | POA: Diagnosis present

## 2020-11-05 DIAGNOSIS — G629 Polyneuropathy, unspecified: Secondary | ICD-10-CM | POA: Diagnosis present

## 2020-11-05 DIAGNOSIS — G8194 Hemiplegia, unspecified affecting left nondominant side: Secondary | ICD-10-CM | POA: Diagnosis present

## 2020-11-05 DIAGNOSIS — E785 Hyperlipidemia, unspecified: Secondary | ICD-10-CM | POA: Diagnosis present

## 2020-11-05 DIAGNOSIS — R29705 NIHSS score 5: Secondary | ICD-10-CM | POA: Diagnosis not present

## 2020-11-05 DIAGNOSIS — R002 Palpitations: Secondary | ICD-10-CM | POA: Diagnosis present

## 2020-11-05 DIAGNOSIS — I10 Essential (primary) hypertension: Secondary | ICD-10-CM | POA: Diagnosis present

## 2020-11-05 DIAGNOSIS — F332 Major depressive disorder, recurrent severe without psychotic features: Secondary | ICD-10-CM | POA: Diagnosis present

## 2020-11-05 DIAGNOSIS — I639 Cerebral infarction, unspecified: Secondary | ICD-10-CM | POA: Diagnosis present

## 2020-11-05 DIAGNOSIS — R1310 Dysphagia, unspecified: Secondary | ICD-10-CM | POA: Diagnosis present

## 2020-11-05 DIAGNOSIS — Z6841 Body Mass Index (BMI) 40.0 and over, adult: Secondary | ICD-10-CM

## 2020-11-05 DIAGNOSIS — F1420 Cocaine dependence, uncomplicated: Secondary | ICD-10-CM | POA: Diagnosis present

## 2020-11-05 DIAGNOSIS — Z23 Encounter for immunization: Secondary | ICD-10-CM

## 2020-11-05 DIAGNOSIS — R29702 NIHSS score 2: Secondary | ICD-10-CM | POA: Diagnosis present

## 2020-11-05 DIAGNOSIS — N170 Acute kidney failure with tubular necrosis: Secondary | ICD-10-CM | POA: Diagnosis present

## 2020-11-05 LAB — I-STAT BETA HCG BLOOD, ED (MC, WL, AP ONLY): I-stat hCG, quantitative: <5 m[IU]/mL (ref <5)

## 2020-11-05 LAB — COMPREHENSIVE METABOLIC PANEL
ALT: 12 U/L (ref 0–44)
AST: 20 U/L (ref 15–41)
Albumin: 3.5 g/dL (ref 3.5–5.0)
Alkaline Phosphatase: 61 U/L (ref 38–126)
Anion gap: 12 (ref 5–15)
BUN: 14 mg/dL (ref 6–20)
CO2: 24 mmol/L (ref 22–32)
Calcium: 9.3 mg/dL (ref 8.9–10.3)
Chloride: 102 mmol/L (ref 98–111)
Creatinine, Ser: 1.39 mg/dL — ABNORMAL HIGH (ref 0.44–1.00)
GFR, Estimated: 47 mL/min — ABNORMAL LOW (ref >60)
Glucose, Bld: 122 mg/dL — ABNORMAL HIGH (ref 70–99)
Potassium: 4.1 mmol/L (ref 3.5–5.1)
Sodium: 138 mmol/L (ref 135–145)
Total Bilirubin: 0.7 mg/dL (ref 0.3–1.2)
Total Protein: 6.5 g/dL (ref 6.5–8.1)

## 2020-11-05 LAB — RESP PANEL BY RT-PCR (FLU A&B, COVID) ARPGX2
Influenza A by PCR: NEGATIVE
Influenza B by PCR: NEGATIVE
SARS Coronavirus 2 by RT PCR: NEGATIVE

## 2020-11-05 LAB — I-STAT CHEM 8, ED
BUN: 18 mg/dL (ref 6–20)
Calcium, Ion: 1.24 mmol/L (ref 1.15–1.40)
Chloride: 103 mmol/L (ref 98–111)
Creatinine, Ser: 1.3 mg/dL — ABNORMAL HIGH (ref 0.44–1.00)
Glucose, Bld: 119 mg/dL — ABNORMAL HIGH (ref 70–99)
HCT: 43 % (ref 36.0–46.0)
Hemoglobin: 14.6 g/dL (ref 12.0–15.0)
Potassium: 4.1 mmol/L (ref 3.5–5.1)
Sodium: 140 mmol/L (ref 135–145)
TCO2: 27 mmol/L (ref 22–32)

## 2020-11-05 LAB — DIFFERENTIAL
Abs Immature Granulocytes: 0.01 10*3/uL (ref 0.00–0.07)
Basophils Absolute: 0 10*3/uL (ref 0.0–0.1)
Basophils Relative: 0 %
Eosinophils Absolute: 0.1 10*3/uL (ref 0.0–0.5)
Eosinophils Relative: 2 %
Immature Granulocytes: 0 %
Lymphocytes Relative: 22 %
Lymphs Abs: 1.8 10*3/uL (ref 0.7–4.0)
Monocytes Absolute: 0.6 10*3/uL (ref 0.1–1.0)
Monocytes Relative: 7 %
Neutro Abs: 5.6 10*3/uL (ref 1.7–7.7)
Neutrophils Relative %: 69 %

## 2020-11-05 LAB — CBC
HCT: 41.7 % (ref 36.0–46.0)
Hemoglobin: 13.9 g/dL (ref 12.0–15.0)
MCH: 30.8 pg (ref 26.0–34.0)
MCHC: 33.3 g/dL (ref 30.0–36.0)
MCV: 92.5 fL (ref 80.0–100.0)
Platelets: 159 10*3/uL (ref 150–400)
RBC: 4.51 MIL/uL (ref 3.87–5.11)
RDW: 13.3 % (ref 11.5–15.5)
WBC: 8.1 10*3/uL (ref 4.0–10.5)
nRBC: 0 % (ref 0.0–0.2)

## 2020-11-05 LAB — APTT: aPTT: 21 seconds — ABNORMAL LOW (ref 24–36)

## 2020-11-05 LAB — CBG MONITORING, ED: Glucose-Capillary: 125 mg/dL — ABNORMAL HIGH (ref 70–99)

## 2020-11-05 LAB — PROTIME-INR
INR: 0.9 (ref 0.8–1.2)
Prothrombin Time: 11.9 seconds (ref 11.4–15.2)

## 2020-11-05 LAB — ETHANOL: Alcohol, Ethyl (B): <10 mg/dL (ref <10)

## 2020-11-05 IMAGING — MR MR HEAD WO/W CM
14 of 16 series · 40 of 48 positions shown · IV contrast (gadavist)
Comparison: CT angio head and CT perfusion [DATE]

CLINICAL DATA: Acute neuro deficit rule out stroke

EXAM:
MRI HEAD WITHOUT AND WITH CONTRAST
TECHNIQUE: Multiplanar, multiecho pulse sequences of the brain and surrounding
structures were obtained without and with intravenous contrast.
CONTRAST:  9.5mL GADAVIST GADOBUTROL 1 MMOL/ML IV SOLN

[Series 5: DWI · axial · 3.0mm · 0.88mm/px · z∈[-108,+33]mm · 7 of 96 slices shown (1 of 4)]
[im 1/96]
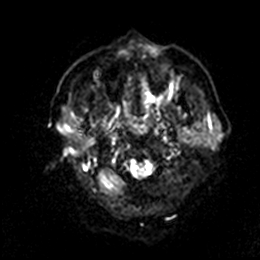
[im 16/96]
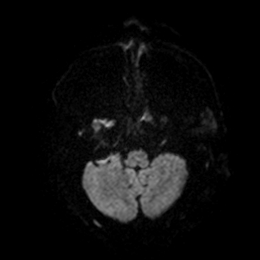
[im 32/96]
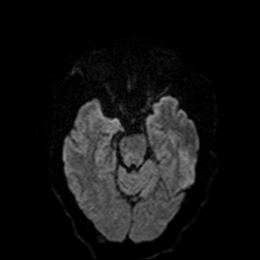
[im 48/96]
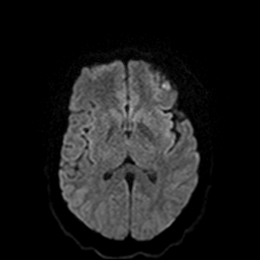
[im 64/96]
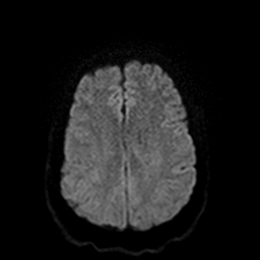
[im 80/96]
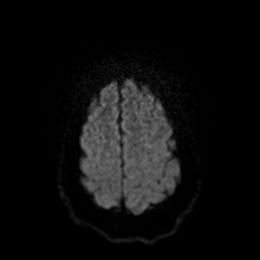
[im 96/96]
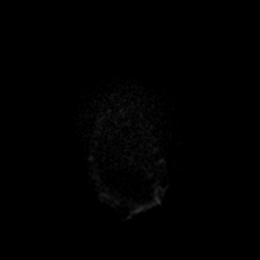

[Series 6: DWI · axial · 3.0mm · 0.88mm/px · z∈[-108,+33]mm · 3 of 48 slices shown (2 of 4)]
[im 1/48]
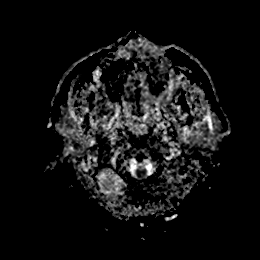
[im 24/48]
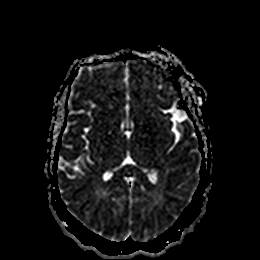
[im 48/48]
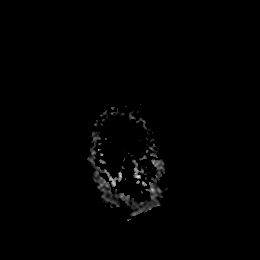

[Series 7: DWI · coronal · 4.0mm · 0.88mm/px · 4 of 68 slices shown (3 of 4)]
[im 1/68]
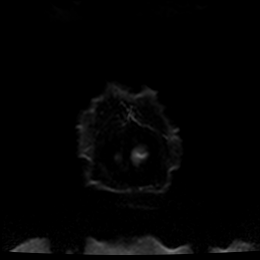
[im 23/68]
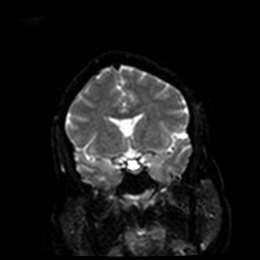
[im 45/68]
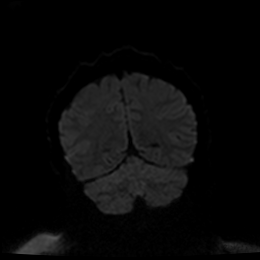
[im 68/68]
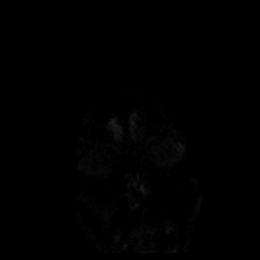

[Series 8: DWI · coronal · 4.0mm · 0.88mm/px · 2 of 34 slices shown (4 of 4)]
[im 1/34]
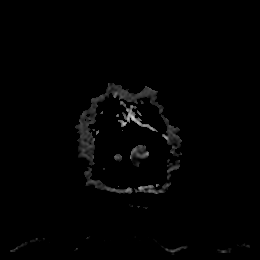
[im 34/34]
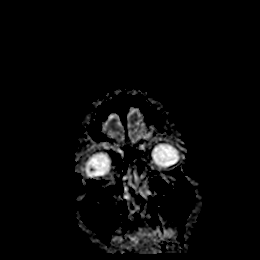

[Series 9: T1 · sagittal · 5.0mm · 0.75mm/px · 1 of 23 slices shown]
[im 1/23]
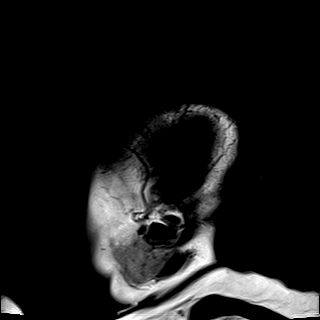

[Series 10: T2 · axial · 5.0mm · 0.72mm/px · z∈[-126,+17]mm · 2 of 25 slices shown]
[im 1/25]
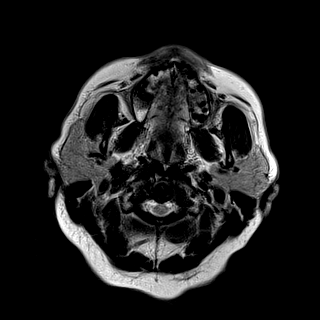
[im 25/25]
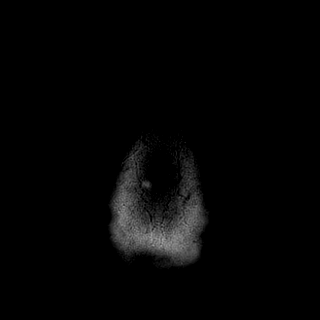

[Series 11: FLAIR · axial · 5.0mm · 0.45mm/px · z∈[-122,+20]mm · 2 of 25 slices shown]
[im 1/25]
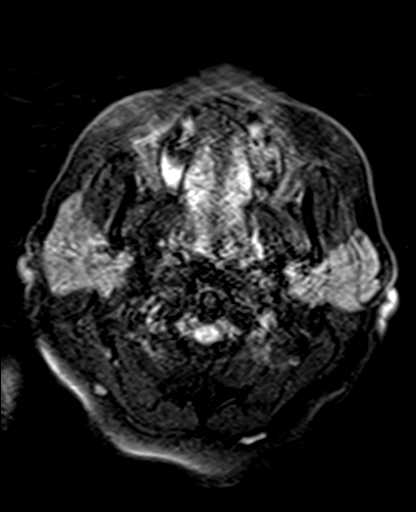
[im 25/25]
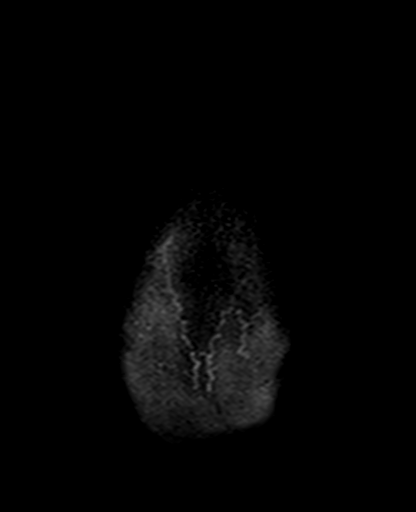

[Series 12: mag_images · axial · 3.0mm · 0.90mm/px · z∈[-139,+36]mm · 4 of 60 slices shown]
[im 1/60]
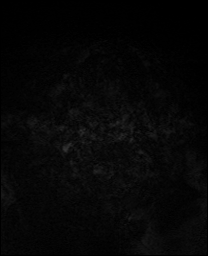
[im 20/60]
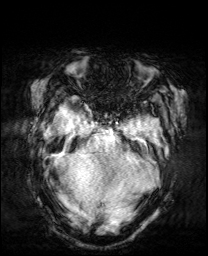
[im 40/60]
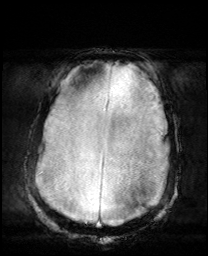
[im 60/60]
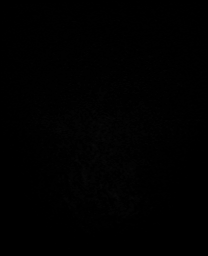

[Series 13: pha_images · axial · 3.0mm · 0.90mm/px · z∈[-139,+22]mm · 3 of 54 slices shown]
[im 1/54]
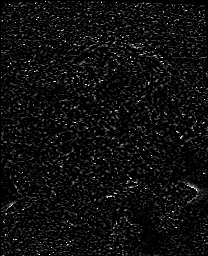
[im 27/54]
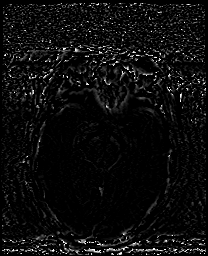
[im 54/54]
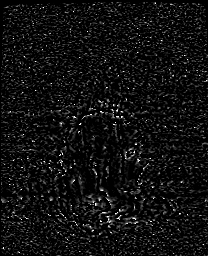

[Series 14: swi_images · axial · 3.0mm · 0.90mm/px · z∈[-139,+36]mm · 4 of 60 slices shown]
[im 1/60]
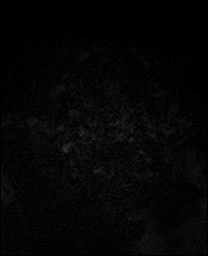
[im 20/60]
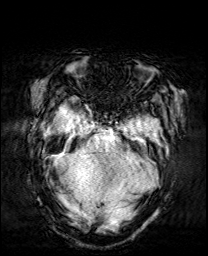
[im 40/60]
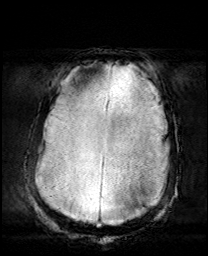
[im 60/60]
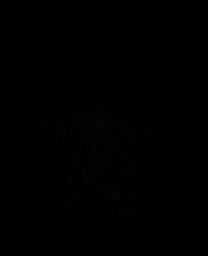

[Series 15: mip_images(sw) · axial · 24.0mm · 0.90mm/px · z∈[-128,+26]mm · 3 of 53 slices shown]
[im 1/53]
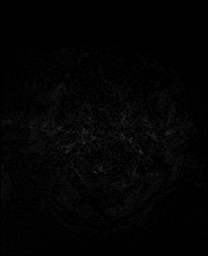
[im 27/53]
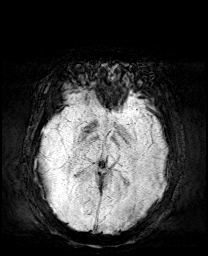
[im 53/53]
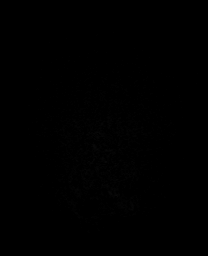

[Series 17: T2 post-contrast · coronal · 5.0mm · 0.72mm/px · 2 of 28 slices shown]
[im 1/28]
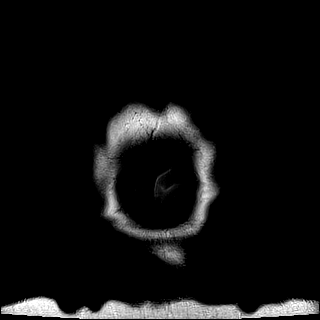
[im 28/28]
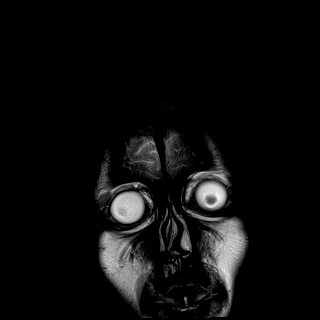

[Series 19: T1 post-contrast · coronal · 5.0mm · 0.34mm/px · 2 of 28 slices shown (1 of 2)]
[im 1/28]
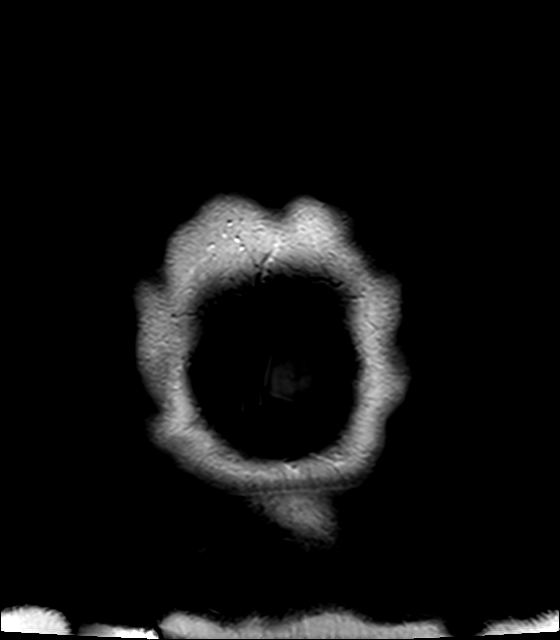
[im 28/28]
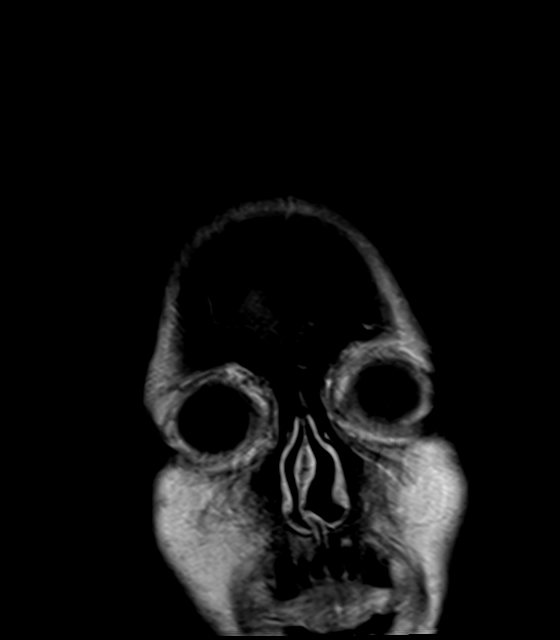

[Series 20: T1 post-contrast · sagittal · 5.0mm · 0.72mm/px · 1 of 23 slices shown (2 of 2)]
[im 1/23]
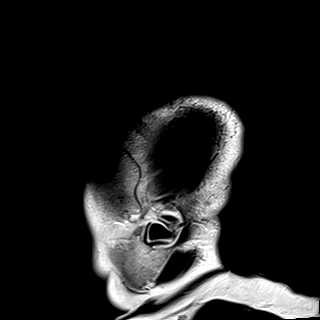

[40 of 48 positions shown; findings below may reference images not displayed]

FINDINGS: Brain: Small area of acute infarct in the left posterior midbrain in
the roof of the fourth ventricle. Additional small area of acute
infarct in the right thalamus.

Minimal chronic white matter changes. Chronic lacunar infarction in
the right pons. Negative for hemorrhage or mass. Normal enhancement
postcontrast administration.

Vascular: Normal arterial flow voids

Skull and upper cervical spine: No focal skeletal abnormality.

Sinuses/Orbits: Mucosal edema left maxillary sinus. Mild mastoid
effusion bilaterally. Normal orbit

Other: None
IMPRESSION: Acute infarct in the left posterior midbrain in the roof of the
fourth ventricle.

Small acute infarct right anterior thalamus.

Chronic lacunar infarction in the right pons.

## 2020-11-05 MED ORDER — IOHEXOL 350 MG/ML SOLN
100.0000 mL | Freq: Once | INTRAVENOUS | Status: AC | PRN
  Administered 2020-11-05: 100 mL via INTRAVENOUS

## 2020-11-05 MED ORDER — ACETAMINOPHEN 325 MG PO TABS: 650.0000 mg | ORAL_TABLET | ORAL | Status: DC | PRN

## 2020-11-05 MED ORDER — GADOBUTROL 1 MMOL/ML IV SOLN
9.5000 mL | Freq: Once | INTRAVENOUS | Status: AC | PRN
  Administered 2020-11-05: 9.5 mL via INTRAVENOUS

## 2020-11-05 MED ORDER — ASPIRIN 300 MG RE SUPP
600.0000 mg | Freq: Once | RECTAL | Status: AC
  Administered 2020-11-05: 600 mg via RECTAL
  Filled 2020-11-05: qty 2

## 2020-11-05 MED ORDER — PROCHLORPERAZINE EDISYLATE 10 MG/2ML IJ SOLN
10.0000 mg | Freq: Four times a day (QID) | INTRAMUSCULAR | Status: DC | PRN
  Administered 2020-11-06 (×3): 10 mg via INTRAVENOUS
  Filled 2020-11-05 (×3): qty 2

## 2020-11-05 MED ORDER — ONDANSETRON HCL 4 MG/2ML IJ SOLN
4.0000 mg | Freq: Once | INTRAMUSCULAR | Status: AC
  Administered 2020-11-05: 4 mg via INTRAVENOUS
  Filled 2020-11-05: qty 2

## 2020-11-05 MED ORDER — PANTOPRAZOLE SODIUM 40 MG IV SOLR
40.0000 mg | Freq: Every day | INTRAVENOUS | Status: DC
  Administered 2020-11-06 (×2): 40 mg via INTRAVENOUS
  Filled 2020-11-05 (×2): qty 40

## 2020-11-05 NOTE — ED Triage Notes (Signed)
Pt to ED via EMS from home c/o stroke like symptoms that started at 2am. Last known well 2am.  Symptoms including left side facial droop, nausea, HA, slurred speech. Medical HX: DM, PTSD, HTN 122/76, 86hr , 99%, HR 20.  cbg 170. #20 L hand, 4mg  zofran given by EMS with relief.

## 2020-11-05 NOTE — ED Provider Notes (Signed)
[facility] EMERGENCY DEPARTMENT Provider Note   CSN: [id number] Arrival date & time: 11/05/20  1733  An emergency department physician performed an initial assessment on this suspected stroke patient at 1756.  History Chief Complaint  Patient presents with  . Stroke Symptoms    Cathy Hall is a 50 y.o. female.  Patient presents to ER chief complaint of weakness of the left upper and left lower extremity, weakness of left side of the face.  Patient states that she was last normal and well around midnight.  Then she got up around 2 AM to go to the bathroom and noticed she felt lightheaded and weak in the left arm left leg and had difficulty swallowing and weakness of the left side of the face.  No fever no cough no vomiting no diarrhea.        Past Medical History:  Diagnosis Date  . Anxiety   . Asthma   . COPD (chronic obstructive pulmonary disease) (HCC)   . Depression   . Hypertension   . Major depression   . PTSD (post-traumatic stress disorder)     Patient Active Problem List   Diagnosis Date Noted  . Arterial ischemic stroke (HCC) 11/05/2020  . Cocaine use disorder, moderate, dependence (HCC) 08/09/2020  . Cannabis use disorder, moderate, dependence (HCC) 08/09/2020  . Severe episode of recurrent major depressive disorder, without psychotic features [facility])     Past Surgical History:  Procedure Laterality Date  . ECTOPIC PREGNANCY SURGERY       OB History   No obstetric history on file.     No family history on file.  Social History   Tobacco Use  . Smoking status: Current Every Day Smoker    Types: Cigarettes  . Smokeless tobacco: Never Used  Vaping Use  . Vaping Use: Never used  Substance Use Topics  . Alcohol use: Yes  . Drug use: Not Currently    Home Medications Prior to Admission medications   Medication Sig Start Date End Date Taking? Authorizing Provider  FLUoxetine (PROZAC) 10 MG capsule Take 1 capsule  (10 mg total) by mouth daily. 08/09/20   Zena Amos, MD  gabapentin (NEURONTIN) 100 MG capsule Take 1 capsule (100 mg total) by mouth 2 (two) times daily. 08/09/20   Zena Amos, MD  hydrOXYzine (ATARAX/VISTARIL) 25 MG tablet Take 1 tablet (25 mg total) by mouth 3 (three) times daily as needed for anxiety. 08/09/20   Zena Amos, MD  OLANZapine (ZYPREXA) 5 MG tablet Take 1 tablet (5 mg total) by mouth at bedtime. 08/09/20 08/09/21  Zena Amos, MD  triamterene-hydrochlorothiazide (MAXZIDE-25) 37.5-25 MG tablet Take 1 tablet by mouth daily. 08/09/20   Money, Gerlene Burdock, FNP    Allergies    Nsaids  Review of Systems   Review of Systems  Constitutional: Negative for fever.  HENT: Negative for ear pain.   Eyes: Negative for pain.  Respiratory: Negative for cough.   Cardiovascular: Negative for chest pain.  Gastrointestinal: Negative for abdominal pain.  Genitourinary: Negative for flank pain.  Musculoskeletal: Negative for back pain.  Skin: Negative for rash.  Neurological: Negative for headaches.    Physical Exam Updated Vital Signs BP (!) 170/99   Pulse 84   Temp 98.3 F (36.8 C) (Oral)   Resp (!) 27   Ht 5\' 1"  (1.549 m)   Wt 97.1 kg   SpO2 99%   BMI 40.43 kg/m   Physical Exam Constitutional:  General: She is not in acute distress.    Appearance: Normal appearance.  HENT:     Head: Normocephalic.     Nose: Nose normal.  Eyes:     Extraocular Movements: Extraocular movements intact.  Cardiovascular:     Rate and Rhythm: Normal rate.  Pulmonary:     Effort: Pulmonary effort is normal.  Musculoskeletal:        General: Normal range of motion.     Cervical back: Normal range of motion.  Neurological:     Mental Status: She is alert.     Comments: Patient has slurred speech.  Appears to have neuro deficit of the left upper and left lower face although somewhat equivocal.  No upper extremity drift seen on the left side but the patient espouses weakness of the  left upper extremity as well as left lower extremity.  Patient is not fully compliant with neuro exam and finding is difficult to quantify, however she is able to lift both legs up off the bed.  The left lower extremity seems to bob up and down and she states she feels weaker in that leg..     ED Results / Procedures / Treatments   Labs (all labs ordered are listed, but only abnormal results are displayed) Labs Reviewed  APTT - Abnormal; Notable for the following components:      Result Value   aPTT 21 (*)    All other components within normal limits  COMPREHENSIVE METABOLIC PANEL - Abnormal; Notable for the following components:   Glucose, Bld 122 (*)    Creatinine, Ser 1.39 (*)    GFR, Estimated 47 (*)    All other components within normal limits  I-STAT CHEM 8, ED - Abnormal; Notable for the following components:   Creatinine, Ser 1.30 (*)    Glucose, Bld 119 (*)    All other components within normal limits  CBG MONITORING, ED - Abnormal; Notable for the following components:   Glucose-Capillary 125 (*)    All other components within normal limits  RESP PANEL BY RT-PCR (FLU A&B, COVID) ARPGX2  ETHANOL  PROTIME-INR  CBC  DIFFERENTIAL  RAPID URINE DRUG SCREEN, HOSP PERFORMED  URINALYSIS, ROUTINE W REFLEX MICROSCOPIC  I-STAT BETA HCG BLOOD, ED ([facility], [facility], [facility] ONLY)    EKG None  Radiology CT Angio Head W or Wo Contrast  Result Date: 11/05/2020 CLINICAL DATA:  Acute neuro deficit. Left facial droop, headache and slurred speech EXAM: CT ANGIOGRAPHY HEAD AND NECK CT PERFUSION BRAIN TECHNIQUE: Multidetector CT imaging of the head and neck was performed using the standard protocol during bolus administration of intravenous contrast. Multiplanar CT image reconstructions and MIPs were obtained to evaluate the vascular anatomy. Carotid stenosis measurements (when applicable) are obtained utilizing NASCET criteria, using the distal internal carotid diameter as the denominator. Multiphase CT  imaging of the brain was performed following IV bolus contrast injection. Subsequent parametric perfusion maps were calculated using RAPID software. CONTRAST:  OMNIPAQUE IOHEXOL 350 MG/ML SOLN COMPARISON:  CT head 11/05/2020 FINDINGS: CTA NECK FINDINGS Aortic arch: Mild atherosclerotic calcification aortic arch. Proximal great vessels widely patent. Right carotid system: Mild atherosclerotic disease right carotid bifurcation without significant stenosis Left carotid system: Mild atherosclerotic disease left carotid bifurcation without stenosis. Vertebral arteries: Both vertebral arteries widely patent without stenosis. Skeleton: No acute skeletal abnormality. Other neck: Thyromegaly without focal lesion. Upper chest: Lung apices clear bilaterally. Review of the MIP images confirms the above findings CTA HEAD FINDINGS Anterior circulation: Mild  atherosclerotic calcification in the cavernous carotid bilaterally without stenosis. Anterior and middle cerebral arteries normal bilaterally without stenosis or large vessel occlusion. Posterior circulation: Both vertebral arteries patent to the basilar. Basilar widely patent. Superior cerebellar and posterior cerebral arteries patent bilaterally without stenosis. Small left PICA patent. Right PICA not visualized. AICA and superior cerebellar arteries patent bilaterally. Venous sinuses: Limited venous enhancement Anatomic variants: None Review of the MIP images confirms the above findings CT Brain Perfusion Findings: ASPECTS: 10 CBF (<30%) Volume: 61mL Perfusion (Tmax>6.0s) volume: 38mL Mismatch Volume: 38mL Infarction Location:None IMPRESSION: 1. CT perfusion negative for acute infarct or ischemia 2. Negative for intracranial large vessel occlusion 3. No significant carotid or vertebral artery stenosis. 4. These results were called by telephone at the time of interpretation on 11/05/2020 at 7:03 pm to provider ERIC [facility] , who verbally acknowledged these results.  Electronically Signed   By: Franchot Gallo M.D.   On: 11/05/2020 19:04   CT Angio Neck W and/or Wo Contrast  Result Date: 11/05/2020 CLINICAL DATA:  Acute neuro deficit. Left facial droop, headache and slurred speech EXAM: CT ANGIOGRAPHY HEAD AND NECK CT PERFUSION BRAIN TECHNIQUE: Multidetector CT imaging of the head and neck was performed using the standard protocol during bolus administration of intravenous contrast. Multiplanar CT image reconstructions and MIPs were obtained to evaluate the vascular anatomy. Carotid stenosis measurements (when applicable) are obtained utilizing NASCET criteria, using the distal internal carotid diameter as the denominator. Multiphase CT imaging of the brain was performed following IV bolus contrast injection. Subsequent parametric perfusion maps were calculated using RAPID software. CONTRAST:  194mL OMNIPAQUE IOHEXOL 350 MG/ML SOLN COMPARISON:  CT head 11/05/2020 FINDINGS: CTA NECK FINDINGS Aortic arch: Mild atherosclerotic calcification aortic arch. Proximal great vessels widely patent. Right carotid system: Mild atherosclerotic disease right carotid bifurcation without significant stenosis Left carotid system: Mild atherosclerotic disease left carotid bifurcation without stenosis. Vertebral arteries: Both vertebral arteries widely patent without stenosis. Skeleton: No acute skeletal abnormality. Other neck: Thyromegaly without focal lesion. Upper chest: Lung apices clear bilaterally. Review of the MIP images confirms the above findings CTA HEAD FINDINGS Anterior circulation: Mild atherosclerotic calcification in the cavernous carotid bilaterally without stenosis. Anterior and middle cerebral arteries normal bilaterally without stenosis or large vessel occlusion. Posterior circulation: Both vertebral arteries patent to the basilar. Basilar widely patent. Superior cerebellar and posterior cerebral arteries patent bilaterally without stenosis. Small left PICA patent. Right PICA  not visualized. AICA and superior cerebellar arteries patent bilaterally. Venous sinuses: Limited venous enhancement Anatomic variants: None Review of the MIP images confirms the above findings CT Brain Perfusion Findings: ASPECTS: 10 CBF (<30%) Volume: 11mL Perfusion (Tmax>6.0s) volume: 69mL Mismatch Volume: 78mL Infarction Location:None IMPRESSION: 1. CT perfusion negative for acute infarct or ischemia 2. Negative for intracranial large vessel occlusion 3. No significant carotid or vertebral artery stenosis. 4. These results were called by telephone at the time of interpretation on 11/05/2020 at 7:03 pm to provider ERIC [facility] , who verbally acknowledged these results. Electronically Signed   By: Franchot Gallo M.D.   On: 11/05/2020 19:04   MR BRAIN W WO CONTRAST  Result Date: 11/05/2020 CLINICAL DATA:  Acute neuro deficit rule out stroke EXAM: MRI HEAD WITHOUT AND WITH CONTRAST TECHNIQUE: Multiplanar, multiecho pulse sequences of the brain and surrounding structures were obtained without and with intravenous contrast. CONTRAST:  9.16mL GADAVIST GADOBUTROL 1 MMOL/ML IV SOLN COMPARISON:  CT angio head and CT perfusion 11/05/2020 FINDINGS: Brain: Small area of acute infarct in the left  posterior midbrain in the roof of the fourth ventricle. Additional small area of acute infarct in the right thalamus. Minimal chronic white matter changes. 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 Negative for hemorrhage or mass. Normal enhancement postcontrast administration. Vascular: Normal arterial flow voids Skull and upper cervical spine: No focal skeletal abnormality. Sinuses/Orbits: Mucosal edema left maxillary sinus. Mild mastoid effusion bilaterally. Normal orbit Other: None IMPRESSION: Acute infarct in the left posterior midbrain in the roof of the fourth ventricle. Small acute infarct right anterior thalamus. 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 Electronically Signed   By: Marlan Palau M.D.   On: 11/05/2020  20:15   CT CEREBRAL PERFUSION W CONTRAST  Result Date: 11/05/2020 CLINICAL DATA:  Acute neuro deficit. Left facial droop, headache and slurred speech EXAM: CT ANGIOGRAPHY HEAD AND NECK CT PERFUSION BRAIN TECHNIQUE: Multidetector CT imaging of the head and neck was performed using the standard protocol during bolus administration of intravenous contrast. Multiplanar CT image reconstructions and MIPs were obtained to evaluate the vascular anatomy. Carotid stenosis measurements (when applicable) are obtained utilizing NASCET criteria, using the distal internal carotid diameter as the denominator. Multiphase CT imaging of the brain was performed following IV bolus contrast injection. Subsequent parametric perfusion maps were calculated using RAPID software. CONTRAST:  OMNIPAQUE IOHEXOL 350 MG/ML SOLN COMPARISON:  CT head 11/05/2020 FINDINGS: CTA NECK FINDINGS Aortic arch: Mild atherosclerotic calcification aortic arch. Proximal great vessels widely patent. Right carotid system: Mild atherosclerotic disease right carotid bifurcation without significant stenosis Left carotid system: Mild atherosclerotic disease left carotid bifurcation without stenosis. Vertebral arteries: Both vertebral arteries widely patent without stenosis. Skeleton: No acute skeletal abnormality. Other neck: Thyromegaly without focal lesion. Upper chest: Lung apices clear bilaterally. Review of the MIP images confirms the above findings CTA HEAD FINDINGS Anterior circulation: Mild atherosclerotic calcification in the cavernous carotid bilaterally without stenosis. Anterior and middle cerebral arteries normal bilaterally without stenosis or large vessel occlusion. Posterior circulation: Both vertebral arteries patent to the basilar. Basilar widely patent. Superior cerebellar and posterior cerebral arteries patent bilaterally without stenosis. Small left PICA patent. Right PICA not visualized. AICA and superior cerebellar arteries patent  bilaterally. Venous sinuses: Limited venous enhancement Anatomic variants: None Review of the MIP images confirms the above findings CT Brain Perfusion Findings: ASPECTS: 10 CBF (<30%) Volume: 25mL Perfusion (Tmax>6.0s) volume: 31mL Mismatch Volume: 35mL Infarction Location:None IMPRESSION: 1. CT perfusion negative for acute infarct or ischemia 2. Negative for intracranial large vessel occlusion 3. No significant carotid or vertebral artery stenosis. 4. These results were called by telephone at the time of interpretation on 11/05/2020 at 7:03 pm to provider ERIC [facility] , who verbally acknowledged these results. Electronically Signed   By: Marlan Palau M.D.   On: 11/05/2020 19:04   CT HEAD CODE STROKE WO CONTRAST  Result Date: 11/05/2020 CLINICAL DATA:  Code stroke. Acute neuro deficit. Left facial droop headache slurred speech EXAM: CT HEAD WITHOUT CONTRAST TECHNIQUE: Contiguous axial images were obtained from the base of the skull through the vertex without intravenous contrast. COMPARISON:  None. FINDINGS: Brain: No evidence of acute infarction, hemorrhage, hydrocephalus, extra-axial collection or mass lesion/mass effect. Vascular: Negative for hyperdense vessel Skull: Negative Sinuses/Orbits: Mild mucosal edema left maxillary sinus otherwise clear. Normal orbit Other: None ASPECTS (Alberta Stroke Program Early CT Score) - Ganglionic level infarction (caudate, lentiform nuclei, internal capsule, insula, M1-M3 cortex): 7 - Supraganglionic infarction (M4-M6 cortex): 3 Total score (0-10 with 10 being normal): 10 IMPRESSION: 1. Negative CT of  the brain 2. ASPECTS is 10 3. Code stroke imaging results were communicated on 11/05/2020 at 6:26 pm to provider Lindzen via text page Electronically Signed   By: Marlan Palau M.D.   On: 11/05/2020 18:27    Procedures .Critical Care Performed by: Cheryll Cockayne, MD Authorized by: Cheryll Cockayne, MD   Critical care provider statement:    Critical care time (minutes):   40   Critical care time was exclusive of:  Separately billable procedures and treating other patients and teaching time   Critical care was necessary to treat or prevent imminent or life-threatening deterioration of the following conditions:  CNS failure or compromise Comments:     Acute stroke activation with possible neuro intervention.   (including critical care time)  Medications Ordered in ED Medications  iohexol (OMNIPAQUE) 350 MG/ML injection 100 mL (100 mLs Intravenous Contrast Given 11/05/20 1851)  gadobutrol (GADAVIST) 1 MMOL/ML injection 9.5 mL (9.5 mLs Intravenous Contrast Given 11/05/20 1954)  aspirin suppository 600 mg (600 mg Rectal Given 11/05/20 2154)  ondansetron (ZOFRAN) injection 4 mg (4 mg Intravenous Given 11/05/20 2153)    ED Course  I have reviewed the triage vital signs and the nursing notes.  Pertinent labs & imaging results that were available during my care of the patient were reviewed by me and considered in my medical decision making (see chart for details).    MDM Rules/Calculators/A&P                          Onset of symptoms greater than 6 hours, patient not a candidate for TPA.  Stroke alert activated for possible neuro intervention.  Clinically difficult to ascertain if this i Bell's palsy or true CVA given patient's presentation.  Case discussed with neurology.  Imaging pursued.  MRI of the brain is concerning for acute stroke.  Patient admitted to hospitalist team.   Final Clinical Impression(s) / ED Diagnoses Final diagnoses:  Acute stroke due to ischemia [facility])    Rx / DC Orders ED Discharge Orders    None       Cheryll Cockayne, MD 11/05/20 2157

## 2020-11-05 NOTE — Consult Note (Signed)
Referring Physician: Dr. Audley Hose    Chief Complaint: Acute onset of left facial droop, dysarthria and left sided weakness  HPI: Cathy Hall is an 50 y.o. female with a PMHx of anxiety, COPD, morbid obesity, PTSD, major depression and HTN, presenting from home via EMS for acute onset of left facial droop, dysarthria and left side. LKN was yesterday night at about 0200 when symptoms of left sided drooling and trouble swallowing food were first noted. Prior to that she had been at a [employer] party at which she was asymptomatic. She states that she has had intermittent spells of vertigo with lightheadedness for the past week and a half, and that with the drooling, she also had acute onset of the dizziness symptoms. She tried to eat and had to cut up her food into small pieces to get it down. She went to sleep and woke up with the same symptoms. She then noticed, at about 10 AM, left arm and leg weakness. Her boyfriend came home later in the day and convinced her that she needed to go to the ER. EMS was called. The patient is a poor historian and questions had to be asked multiple times for clarification on several of the events detailed above, including the time of symptom onset of 0200.   LSN: 0200 tPA Given: No: Out of time window  Past Medical History:  Diagnosis Date  . Anxiety   . Asthma   . COPD (chronic obstructive pulmonary disease) (HCC)   . Depression   . Hypertension   . Major depression   . PTSD (post-traumatic stress disorder)     Past Surgical History:  Procedure Laterality Date  . ECTOPIC PREGNANCY SURGERY      No family history on file. Social History:  reports that she has been smoking cigarettes. She has never used smokeless tobacco. She reports current alcohol use. She reports previous drug use.  Allergies: No Known Allergies  Home Medications:  No current facility-administered medications on file prior to encounter.   Current Outpatient Medications on  File Prior to Encounter  Medication Sig Dispense Refill  . FLUoxetine (PROZAC) 10 MG capsule Take 1 capsule (10 mg total) by mouth daily. 30 capsule 1  . gabapentin (NEURONTIN) 100 MG capsule Take 1 capsule (100 mg total) by mouth 2 (two) times daily. 60 capsule 1  . hydrOXYzine (ATARAX/VISTARIL) 25 MG tablet Take 1 tablet (25 mg total) by mouth 3 (three) times daily as needed for anxiety. 90 tablet 1  . OLANZapine (ZYPREXA) 5 MG tablet Take 1 tablet (5 mg total) by mouth at bedtime. 30 tablet 1  . triamterene-hydrochlorothiazide (MAXZIDE-25) 37.5-25 MG tablet Take 1 tablet by mouth daily. 30 tablet 0     ROS: Has a headache. Denies CP. Other ROS as per HPI. Detailed ROS deferred due to acuity of presentation.   Physical Examination: Blood pressure 122/81, pulse 85, temperature 98.5 F (36.9 C), temperature source Oral, resp. rate (!) 22, height 5\' 1"  (1.549 m), weight 97.1 kg, SpO2 99 %.  HEENT: [place]/AT Lungs: Occasional mild wet cough Ext: Warm and well perfused  Neurologic Examination: Ment: Awake and alert. Speech is dysarthric. In this context her verbal communication is fluent with intact comprehension.  CN: PERRL. Visual fields full with no extinction to DSS. Esotropia noted with left eye slightly deviated medially when gazing forwards; cannot bury sclera of left eye when gazing to the left. Facial temp sensation decreased on the left. Left ffacial droop is prominent.  Phonation intact. Tongue deviates slightly to the left.  Motor: 5/5 RUE and RLE.  3/5 LUE 2/5 LLE Sensory: Decreased temp sensation on the left. DSS without extinction.  Reflexes: 2+ right brachioradialis, 1+ left brachioradialis, trace bilateral patellae, 0 achilles. Cerebellar: No ataxia with FNF on the right. Unable to perform on the left.  Gait: Unable to assess  Results for orders placed or performed during the hospital encounter of 11/05/20 (from the past 48 hour(s))  CBG monitoring, ED     Status: Abnormal    Collection Time: 11/05/20  6:05 PM  Result Value Ref Range   Glucose-Capillary 125 (H) 70 - 99 mg/dL    Comment: Glucose reference range applies only to samples taken after fasting for at least 8 hours.  I-stat chem 8, ED     Status: Abnormal   Collection Time: 11/05/20  6:13 PM  Result Value Ref Range   Sodium 140 135 - 145 mmol/L   Potassium 4.1 3.5 - 5.1 mmol/L   Chloride 103 98 - 111 mmol/L   BUN 18 6 - 20 mg/dL   Creatinine, Ser 9.52 (H) 0.44 - 1.00 mg/dL   Glucose, Bld 841 (H) 70 - 99 mg/dL    Comment: Glucose reference range applies only to samples taken after fasting for at least 8 hours.   Calcium, Ion 1.24 1.15 - 1.40 mmol/L   TCO2 27 22 - 32 mmol/L   Hemoglobin 14.6 12.0 - 15.0 g/dL   HCT 32.4 40.1 - 02.7 %  I-Stat beta hCG blood, ED     Status: None   Collection Time: 11/05/20  6:14 PM  Result Value Ref Range   I-stat hCG, quantitative <5.0 <5 mIU/mL   Comment 3            Comment:   GEST. AGE      CONC.  (mIU/mL)   <=1 WEEK        5 - 50     2 WEEKS       50 - 500     3 WEEKS       100 - 10,000     4 WEEKS     1,000 - 30,000        FEMALE AND NON-PREGNANT FEMALE:     LESS THAN 5 mIU/mL    No results found.  Assessment: 50 y.o. female presenting with new onset of left facial droop, dysarthria and left sided weakness with vertigo. LKN 0200. Code Stroke was called.  1. Exam reveals left motor and sensory deficits as well as dysarthria.  2. CT head reveals no acute abnormality. A small round hypodensity in the right pontine tegmentum appears most consistent with a chronic lacunar infarction.  3. CTA of head and neck with CTP: No LVO. No perfusion deficit.  4. Stroke Risk Factors - morbid obesity and HTN  Recommendations: 1. HgbA1c, fasting lipid panel 2. MRI of the brain without contrast 3. PT consult, OT consult, Speech consult 4. Echocardiogram 5. Start statin. Obtain baseline CK level 6. Prophylactic therapy: Start ASA 81 mg po qd. Administer 650 mg ASA po now  or 600 mg PR if unable to take PO. Has listed allergy to NSAIDs but benefit for stroke prevention significantly outweighs risks.  7. Risk factor modification 8. Telemetry monitoring 9. Frequent neuro checks 10. Permissive HTN until 2 AM, then decrease SBP by 15% per day to final SBP goal of 120-140 11. IVF at 75 cc/hr    @Electronically  signed: Dr.  Kerney Elbe 11/05/2020, 6:27 PM

## 2020-11-05 NOTE — ED Notes (Signed)
Lahoma Rocker (boyfriend) [phone] wants an update

## 2020-11-05 NOTE — H&P (Addendum)
History and Physical    Cathy Hall ZOX:[id number] DOB: 1971-10-08 DOA: 11/05/2020  PCP: Patient, No Pcp Per  Patient coming from:      Chief Complaint:  Chief Complaint  Patient presents with  . Stroke Symptoms     HPI:    50 year old female with past medical history of COPD, hypertension, depression, obesity who presented to [facility] emergency department due to concerns for left facial droop and left-sided weakness.  Patient explains that she awoke from sleep at approximately 2:00 in the morning on 1/1.  As she was attempting to walk to the bathroom she realized that she was having significant difficulty with ambulation, prompting the patient to attempt to stabilize herself with ambulation.  Patient also felt her left side to be somewhat weaker than the right.  Patient's symptoms continue to persist throughout the morning.  Patient also noticed at this point as she was experiencing slurring of her speech and severe headache.  Patient describes the headache is severe in intensity, sharp in quality and generalized without photophobia or neck stiffness.  Upon further questioning, patient also reports a 1-1/2-week history of epigastric abdominal discomfort, sharp to burning in quality, radiating around the abdomen in a bandlike distribution.  Patient presented to [facility] emergency part for evaluation later that day.  TPA was not administered due to patient being outside of the TPA window.  Upon evaluation in the emergency department, Dr. Otelia Limes with neurology was consulted.  The hospitalist group was then called to assess the patient for admission the hospital.   Review of Systems:   Review of Systems  Neurological: Positive for focal weakness and headaches.  All other systems reviewed and are negative.   Past Medical History:  Diagnosis Date  . Anxiety   . Asthma   . COPD (chronic obstructive pulmonary disease) (HCC)   . Depression   .  Hypertension   . Major depression   . PTSD (post-traumatic stress disorder)     Past Surgical History:  Procedure Laterality Date  . ECTOPIC PREGNANCY SURGERY       reports that she has been smoking cigarettes. She has never used smokeless tobacco. She reports current alcohol use. She reports current drug use. Drugs: Cocaine and Marijuana.  Allergies  Allergen Reactions  . Nsaids     Other reaction(s): Kidney Disorder    Family History  Problem Relation Age of Onset  . Cerebral aneurysm Mother   . Diabetes Sister   . Other Neg Hx      Prior to Admission medications   Medication Sig Start Date End Date Taking? Authorizing Provider  FLUoxetine (PROZAC) 10 MG capsule Take 1 capsule (10 mg total) by mouth daily. 08/09/20   Zena Amos, MD  gabapentin (NEURONTIN) 100 MG capsule Take 1 capsule (100 mg total) by mouth 2 (two) times daily. 08/09/20   Zena Amos, MD  hydrOXYzine (ATARAX/VISTARIL) 25 MG tablet Take 1 tablet (25 mg total) by mouth 3 (three) times daily as needed for anxiety. 08/09/20   Zena Amos, MD  OLANZapine (ZYPREXA) 5 MG tablet Take 1 tablet (5 mg total) by mouth at bedtime. 08/09/20 08/09/21  Zena Amos, MD  triamterene-hydrochlorothiazide (MAXZIDE-25) 37.5-25 MG tablet Take 1 tablet by mouth daily. 08/09/20   Money, Gerlene Burdock, FNP    Physical Exam: Vitals:   11/05/20 2126 11/05/20 2200 11/05/20 2315 11/05/20 2330  BP: (!) 170/99 (!) 189/108 (!) 163/106 (!) 167/104  Pulse: 84 82 85 88  Resp: Marland Kitchen)  27 (!) 26 (!) 24 (!) 28  Temp: 98.3 F (36.8 C)     TempSrc: Oral     SpO2: 99% 100% 98% 100%  Weight:      Height:        Constitutional: Acute alert and oriented x3, no associated distress.   Skin: no rashes, no lesions, good skin turgor noted. Eyes: Pupils are equally reactive to light.  No evidence of scleral icterus or conjunctival pallor.  ENMT: Moist mucous membranes noted.  Posterior pharynx clear of any exudate or lesions.   Neck: normal, supple,  no masses, no thyromegaly.  No evidence of jugular venous distension.   Respiratory: clear to auscultation bilaterally, no wheezing, no crackles. Normal respiratory effort. No accessory muscle use.  Cardiovascular: Regular rate and rhythm, no murmurs / rubs / gallops. No extremity edema. 2+ pedal pulses. No carotid bruits.  Chest:   Nontender without crepitus or deformity.   Back:   Nontender without crepitus or deformity. Abdomen: Abdomen is soft and nontender.  No evidence of intra-abdominal masses.  Positive bowel sounds noted in all quadrants.   Musculoskeletal: No joint deformity upper and lower extremities. Good ROM, no contractures. Normal muscle tone.  Neurologic: Notable mild weakness of the distal and proximal muscle groups of the left upper and left lower extremity.  Notable left facial droop.  Sensation is grossly intact..  Patient is responsive to verbal stimuli.   Psychiatric: Patient exhibits normal mood with appropriate affect.  Patient seems to possess insight as to their current situation.     Labs on Admission: I have personally reviewed following labs and imaging studies -   CBC: Recent Labs  Lab 11/05/20 1806 11/05/20 1813  WBC 8.1  --   NEUTROABS 5.6  --   HGB 13.9 14.6  HCT 41.7 43.0  MCV 92.5  --   PLT 159  --    Basic Metabolic Panel: Recent Labs  Lab 11/05/20 1806 11/05/20 1813  NA 138 140  K 4.1 4.1  CL 102 103  CO2 24  --   GLUCOSE 122* 119*  BUN 14 18  CREATININE 1.39* 1.30*  CALCIUM 9.3  --    GFR: Estimated Creatinine Clearance: 56.4 mL/min (A) (by C-G formula based on SCr of 1.3 mg/dL (H)). Liver Function Tests: Recent Labs  Lab 11/05/20 1806  AST 20  ALT 12  ALKPHOS 61  BILITOT 0.7  PROT 6.5  ALBUMIN 3.5   No results for input(s): LIPASE, AMYLASE in the last 168 hours. No results for input(s): AMMONIA in the last 168 hours. Coagulation Profile: Recent Labs  Lab 11/05/20 1806  INR 0.9   Cardiac Enzymes: No results for  input(s): CKTOTAL, CKMB, CKMBINDEX, TROPONINI in the last 168 hours. BNP (last 3 results) No results for input(s): PROBNP in the last 8760 hours. HbA1C: No results for input(s): HGBA1C in the last 72 hours. CBG: Recent Labs  Lab 11/05/20 1805  GLUCAP 125*   Lipid Profile: No results for input(s): CHOL, HDL, LDLCALC, TRIG, CHOLHDL, LDLDIRECT in the last 72 hours. Thyroid Function Tests: No results for input(s): TSH, T4TOTAL, FREET4, T3FREE, THYROIDAB in the last 72 hours. Anemia Panel: No results for input(s): VITAMINB12, FOLATE, FERRITIN, TIBC, IRON, RETICCTPCT in the last 72 hours. Urine analysis:    Component Value Date/Time   COLORURINE AMBER (A) 10/21/2019 1828   APPEARANCEUR CLOUDY (A) 10/21/2019 1828   LABSPEC >=1.030 08/07/2020 1139   PHURINE 6.0 08/07/2020 1139   GLUCOSEU NEGATIVE 08/07/2020 1139  HGBUR NEGATIVE 08/07/2020 1139   BILIRUBINUR NEGATIVE 08/07/2020 1139   KETONESUR NEGATIVE 08/07/2020 1139   PROTEINUR NEGATIVE 08/07/2020 1139   UROBILINOGEN 0.2 08/07/2020 1139   NITRITE NEGATIVE 08/07/2020 1139   LEUKOCYTESUR LARGE (A) 08/07/2020 1139    Radiological Exams on Admission - Personally Reviewed: CT Angio Head W or Wo Contrast  Result Date: 11/05/2020 CLINICAL DATA:  Acute neuro deficit. Left facial droop, headache and slurred speech EXAM: CT ANGIOGRAPHY HEAD AND NECK CT PERFUSION BRAIN TECHNIQUE: Multidetector CT imaging of the head and neck was performed using the standard protocol during bolus administration of intravenous contrast. Multiplanar CT image reconstructions and MIPs were obtained to evaluate the vascular anatomy. Carotid stenosis measurements (when applicable) are obtained utilizing NASCET criteria, using the distal internal carotid diameter as the denominator. Multiphase CT imaging of the brain was performed following IV bolus contrast injection. Subsequent parametric perfusion maps were calculated using RAPID software. CONTRAST:  OMNIPAQUE  IOHEXOL 350 MG/ML SOLN COMPARISON:  CT head 11/05/2020 FINDINGS: CTA NECK FINDINGS Aortic arch: Mild atherosclerotic calcification aortic arch. Proximal great vessels widely patent. Right carotid system: Mild atherosclerotic disease right carotid bifurcation without significant stenosis Left carotid system: Mild atherosclerotic disease left carotid bifurcation without stenosis. Vertebral arteries: Both vertebral arteries widely patent without stenosis. Skeleton: No acute skeletal abnormality. Other neck: Thyromegaly without focal lesion. Upper chest: Lung apices clear bilaterally. Review of the MIP images confirms the above findings CTA HEAD FINDINGS Anterior circulation: Mild atherosclerotic calcification in the cavernous carotid bilaterally without stenosis. Anterior and middle cerebral arteries normal bilaterally without stenosis or large vessel occlusion. Posterior circulation: Both vertebral arteries patent to the basilar. Basilar widely patent. Superior cerebellar and posterior cerebral arteries patent bilaterally without stenosis. Small left PICA patent. Right PICA not visualized. AICA and superior cerebellar arteries patent bilaterally. Venous sinuses: Limited venous enhancement Anatomic variants: None Review of the MIP images confirms the above findings CT Brain Perfusion Findings: ASPECTS: 10 CBF (<30%) Volume: 0mL Perfusion (Tmax>6.0s) volume: 0mL Mismatch Volume: 0mL Infarction Location:None IMPRESSION: 1. CT perfusion negative for acute infarct or ischemia 2. Negative for intracranial large vessel occlusion 3. No significant carotid or vertebral artery stenosis. 4. These results were called by telephone at the time of interpretation on 11/05/2020 at 7:03 pm to provider ERIC [facility] , who verbally acknowledged these results. Electronically Signed   By: Marlan Palau M.D.   On: 11/05/2020 19:04   CT Angio Neck W and/or Wo Contrast  Result Date: 11/05/2020 CLINICAL DATA:  Acute neuro deficit. Left facial  droop, headache and slurred speech EXAM: CT ANGIOGRAPHY HEAD AND NECK CT PERFUSION BRAIN TECHNIQUE: Multidetector CT imaging of the head and neck was performed using the standard protocol during bolus administration of intravenous contrast. Multiplanar CT image reconstructions and MIPs were obtained to evaluate the vascular anatomy. Carotid stenosis measurements (when applicable) are obtained utilizing NASCET criteria, using the distal internal carotid diameter as the denominator. Multiphase CT imaging of the brain was performed following IV bolus contrast injection. Subsequent parametric perfusion maps were calculated using RAPID software. CONTRAST:  OMNIPAQUE IOHEXOL 350 MG/ML SOLN COMPARISON:  CT head 11/05/2020 FINDINGS: CTA NECK FINDINGS Aortic arch: Mild atherosclerotic calcification aortic arch. Proximal great vessels widely patent. Right carotid system: Mild atherosclerotic disease right carotid bifurcation without significant stenosis Left carotid system: Mild atherosclerotic disease left carotid bifurcation without stenosis. Vertebral arteries: Both vertebral arteries widely patent without stenosis. Skeleton: No acute skeletal abnormality. Other neck: Thyromegaly without focal lesion. Upper chest: Lung  apices clear bilaterally. Review of the MIP images confirms the above findings CTA HEAD FINDINGS Anterior circulation: Mild atherosclerotic calcification in the cavernous carotid bilaterally without stenosis. Anterior and middle cerebral arteries normal bilaterally without stenosis or large vessel occlusion. Posterior circulation: Both vertebral arteries patent to the basilar. Basilar widely patent. Superior cerebellar and posterior cerebral arteries patent bilaterally without stenosis. Small left PICA patent. Right PICA not visualized. AICA and superior cerebellar arteries patent bilaterally. Venous sinuses: Limited venous enhancement Anatomic variants: None Review of the MIP images confirms the above  findings CT Brain Perfusion Findings: ASPECTS: 10 CBF (<30%) Volume: 0mL Perfusion (Tmax>6.0s) volume: 0mL Mismatch Volume: 0mL Infarction Location:None IMPRESSION: 1. CT perfusion negative for acute infarct or ischemia 2. Negative for intracranial large vessel occlusion 3. No significant carotid or vertebral artery stenosis. 4. These results were called by telephone at the time of interpretation on 11/05/2020 at 7:03 pm to provider ERIC [facility] , who verbally acknowledged these results. Electronically Signed   By: Marlan Palau M.D.   On: 11/05/2020 19:04   MR BRAIN W WO CONTRAST  Result Date: 11/05/2020 CLINICAL DATA:  Acute neuro deficit rule out stroke EXAM: MRI HEAD WITHOUT AND WITH CONTRAST TECHNIQUE: Multiplanar, multiecho pulse sequences of the brain and surrounding structures were obtained without and with intravenous contrast. CONTRAST:  9.37mL GADAVIST GADOBUTROL 1 MMOL/ML IV SOLN COMPARISON:  CT angio head and CT perfusion 11/05/2020 FINDINGS: Brain: Small area of acute infarct in the left posterior midbrain in the roof of the fourth ventricle. Additional small area of acute infarct in the right thalamus. Minimal chronic white matter changes. 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 Negative for hemorrhage or mass. Normal enhancement postcontrast administration. Vascular: Normal arterial flow voids Skull and upper cervical spine: No focal skeletal abnormality. Sinuses/Orbits: Mucosal edema left maxillary sinus. Mild mastoid effusion bilaterally. Normal orbit Other: None IMPRESSION: Acute infarct in the left posterior midbrain in the roof of the fourth ventricle. Small acute infarct right anterior thalamus. 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 Electronically Signed   By: Marlan Palau M.D.   On: 11/05/2020 20:15   CT CEREBRAL PERFUSION W CONTRAST  Result Date: 11/05/2020 CLINICAL DATA:  Acute neuro deficit. Left facial droop, headache and slurred speech EXAM: CT ANGIOGRAPHY HEAD AND  NECK CT PERFUSION BRAIN TECHNIQUE: Multidetector CT imaging of the head and neck was performed using the standard protocol during bolus administration of intravenous contrast. Multiplanar CT image reconstructions and MIPs were obtained to evaluate the vascular anatomy. Carotid stenosis measurements (when applicable) are obtained utilizing NASCET criteria, using the distal internal carotid diameter as the denominator. Multiphase CT imaging of the brain was performed following IV bolus contrast injection. Subsequent parametric perfusion maps were calculated using RAPID software. CONTRAST:  OMNIPAQUE IOHEXOL 350 MG/ML SOLN COMPARISON:  CT head 11/05/2020 FINDINGS: CTA NECK FINDINGS Aortic arch: Mild atherosclerotic calcification aortic arch. Proximal great vessels widely patent. Right carotid system: Mild atherosclerotic disease right carotid bifurcation without significant stenosis Left carotid system: Mild atherosclerotic disease left carotid bifurcation without stenosis. Vertebral arteries: Both vertebral arteries widely patent without stenosis. Skeleton: No acute skeletal abnormality. Other neck: Thyromegaly without focal lesion. Upper chest: Lung apices clear bilaterally. Review of the MIP images confirms the above findings CTA HEAD FINDINGS Anterior circulation: Mild atherosclerotic calcification in the cavernous carotid bilaterally without stenosis. Anterior and middle cerebral arteries normal bilaterally without stenosis or large vessel occlusion. Posterior circulation: Both vertebral arteries patent to the basilar. Basilar widely patent. Superior  cerebellar and posterior cerebral arteries patent bilaterally without stenosis. Small left PICA patent. Right PICA not visualized. AICA and superior cerebellar arteries patent bilaterally. Venous sinuses: Limited venous enhancement Anatomic variants: None Review of the MIP images confirms the above findings CT Brain Perfusion Findings: ASPECTS: 10 CBF (<30%)  Volume: 0mL Perfusion (Tmax>6.0s) volume: 0mL Mismatch Volume: 0mL Infarction Location:None IMPRESSION: 1. CT perfusion negative for acute infarct or ischemia 2. Negative for intracranial large vessel occlusion 3. No significant carotid or vertebral artery stenosis. 4. These results were called by telephone at the time of interpretation on 11/05/2020 at 7:03 pm to provider ERIC [facility] , who verbally acknowledged these results. Electronically Signed   By: Marlan Palau M.D.   On: 11/05/2020 19:04   CT HEAD CODE STROKE WO CONTRAST  Result Date: 11/05/2020 CLINICAL DATA:  Code stroke. Acute neuro deficit. Left facial droop headache slurred speech EXAM: CT HEAD WITHOUT CONTRAST TECHNIQUE: Contiguous axial images were obtained from the base of the skull through the vertex without intravenous contrast. COMPARISON:  None. FINDINGS: Brain: No evidence of acute infarction, hemorrhage, hydrocephalus, extra-axial collection or mass lesion/mass effect. Vascular: Negative for hyperdense vessel Skull: Negative Sinuses/Orbits: Mild mucosal edema left maxillary sinus otherwise clear. Normal orbit Other: None ASPECTS (Alberta Stroke Program Early CT Score) - Ganglionic level infarction (caudate, lentiform nuclei, internal capsule, insula, M1-M3 cortex): 7 - Supraganglionic infarction (M4-M6 cortex): 3 Total score (0-10 with 10 being normal): 10 IMPRESSION: 1. Negative CT of the brain 2. ASPECTS is 10 3. Code stroke imaging results were communicated on 11/05/2020 at 6:26 pm to provider [place] via text page Electronically Signed   By: Marlan Palau M.D.   On: 11/05/2020 18:27    EKG: Personally reviewed.  Rhythm is normal sinus rhythm with heart rate of 88 bpm.  No dynamic ST segment changes appreciated.  Assessment/Plan Principal Problem:   Arterial ischemic stroke [facility])  MRI brain revealing acute infarct in the left posterior midbrain as well as an additional acute infarct in the right anterior thalamus  Patient has  been initiated on aspirin and statin therapy  Permissive hypertension for now  Serial neurologic checks  Telemetry monitoring  Echocardiogram in the morning  PT/OT/SLP evaluation  Neurology following  Concerning the etiology, patient reports a 1-1/2-week history of intermittent palpitations as well as nausea and epigastric pain.  This possibly could be a suggestion that patient has been having episodes of atrial fibrillation.  Will monitor telemetry closely and if there is no evidence of atrial fibrillation patient will need an outpatient event monitor.  Active Problems:   Severe episode of recurrent major depressive disorder, without psychotic features (HCC)   Continue home regimen of Prozac    Cocaine use disorder, moderate, dependence (HCC)   Patient admits to ongoing cocaine use, last use being several days ago.  Cocaine use at the very least is playing some role in the patient's acute stroke  Patient is been counseled on cessation    Cannabis use disorder, moderate, dependence (HCC)   Counseling patient on cessation    Epigastric pain   Patient complaining of a 1 and half week history of vague epigastric pain  Obtaining lipase, urinalysis, hepatic function panel  Placing patient on intravenous Protonix    Palpitations   Patient reports several week history of palpitations suggestive patient may have gone into atrial fibrillation as of late  Monitoring patient on telemetry  If there is no evidence of arrhythmia on telemetry during this hospitalization patient will  need a home-going event monitor    Essential hypertension    As mentioned above, permissive hypertension for now.   Code Status:  Full code Family Communication: Deferred  Status is: Observation  The patient remains OBS appropriate and will d/c before 2 midnights.  Dispo: The patient is from: Home              Anticipated d/c is to: Home              Anticipated d/c date is: 2 days               Patient currently is not medically stable to d/c.        Marinda Elk MD [facility] Pager [phone]  If 7PM-7AM, please contact night-coverage [email or web address] Use universal [place] password for that web site. If you do not have the password, please call the hospital operator.  11/05/2020, 11:58 PM

## 2020-11-05 NOTE — ED Notes (Signed)
Pt to MRI

## 2020-11-05 NOTE — ED Notes (Signed)
Pt given another sandwich and drink.

## 2020-11-05 NOTE — ED Notes (Signed)
Pt is a poor historian, varying answers to questions asked .

## 2020-11-06 ENCOUNTER — Inpatient Hospital Stay (HOSPITAL_COMMUNITY): Payer: Self-pay

## 2020-11-06 DIAGNOSIS — I639 Cerebral infarction, unspecified: Secondary | ICD-10-CM | POA: Diagnosis present

## 2020-11-06 DIAGNOSIS — I6389 Other cerebral infarction: Secondary | ICD-10-CM

## 2020-11-06 LAB — COMPREHENSIVE METABOLIC PANEL
ALT: 11 U/L (ref 0–44)
AST: 15 U/L (ref 15–41)
Albumin: 3.3 g/dL — ABNORMAL LOW (ref 3.5–5.0)
Alkaline Phosphatase: 55 U/L (ref 38–126)
Anion gap: 14 (ref 5–15)
BUN: 14 mg/dL (ref 6–20)
CO2: 24 mmol/L (ref 22–32)
Calcium: 9 mg/dL (ref 8.9–10.3)
Chloride: 104 mmol/L (ref 98–111)
Creatinine, Ser: 1.11 mg/dL — ABNORMAL HIGH (ref 0.44–1.00)
GFR, Estimated: >60 mL/min (ref >60)
Glucose, Bld: 123 mg/dL — ABNORMAL HIGH (ref 70–99)
Potassium: 3.8 mmol/L (ref 3.5–5.1)
Sodium: 142 mmol/L (ref 135–145)
Total Bilirubin: 0.6 mg/dL (ref 0.3–1.2)
Total Protein: 6 g/dL — ABNORMAL LOW (ref 6.5–8.1)

## 2020-11-06 LAB — LIPID PANEL
Cholesterol: 196 mg/dL (ref 0–200)
HDL: 51 mg/dL (ref >40)
LDL Cholesterol: 113 mg/dL — ABNORMAL HIGH (ref 0–99)
Total CHOL/HDL Ratio: 3.8 RATIO
Triglycerides: 162 mg/dL — ABNORMAL HIGH (ref <150)
VLDL: 32 mg/dL (ref 0–40)

## 2020-11-06 LAB — RAPID URINE DRUG SCREEN, HOSP PERFORMED
Amphetamines: NOT DETECTED
Barbiturates: NOT DETECTED
Benzodiazepines: NOT DETECTED
Cocaine: POSITIVE — AB
Opiates: NOT DETECTED
Tetrahydrocannabinol: POSITIVE — AB

## 2020-11-06 LAB — HEMOGLOBIN A1C
Hgb A1c MFr Bld: 5.5 % (ref 4.8–5.6)
Mean Plasma Glucose: 111.15 mg/dL

## 2020-11-06 LAB — URINALYSIS, ROUTINE W REFLEX MICROSCOPIC
Bilirubin Urine: NEGATIVE
Glucose, UA: NEGATIVE mg/dL
Hgb urine dipstick: NEGATIVE
Ketones, ur: NEGATIVE mg/dL
Nitrite: NEGATIVE
Protein, ur: NEGATIVE mg/dL
Specific Gravity, Urine: 1.013 (ref 1.005–1.030)
pH: 6 (ref 5.0–8.0)

## 2020-11-06 LAB — HIV ANTIBODY (ROUTINE TESTING W REFLEX): HIV Screen 4th Generation wRfx: NONREACTIVE

## 2020-11-06 LAB — LIPASE, BLOOD: Lipase: 27 U/L (ref 11–51)

## 2020-11-06 LAB — ECHOCARDIOGRAM COMPLETE BUBBLE STUDY
Area-P 1/2: 3.53 cm2
S' Lateral: 3 cm

## 2020-11-06 MED ORDER — HYDRALAZINE HCL 20 MG/ML IJ SOLN: 10.0000 mg | Freq: Four times a day (QID) | INTRAMUSCULAR | Status: DC | PRN

## 2020-11-06 MED ORDER — CLOPIDOGREL BISULFATE 75 MG PO TABS
75.0000 mg | ORAL_TABLET | Freq: Every day | ORAL | Status: DC
  Administered 2020-11-06 – 2020-11-09 (×4): 75 mg via ORAL
  Filled 2020-11-06 (×4): qty 1

## 2020-11-06 MED ORDER — FLUOXETINE HCL 10 MG PO CAPS
10.0000 mg | ORAL_CAPSULE | Freq: Every day | ORAL | Status: DC
  Administered 2020-11-06 – 2020-11-09 (×4): 10 mg via ORAL
  Filled 2020-11-06 (×4): qty 1

## 2020-11-06 MED ORDER — HYDROXYZINE HCL 25 MG PO TABS
25.0000 mg | ORAL_TABLET | Freq: Three times a day (TID) | ORAL | Status: DC | PRN
  Administered 2020-11-08: 25 mg via ORAL
  Filled 2020-11-06: qty 1

## 2020-11-06 MED ORDER — POLYETHYLENE GLYCOL 3350 17 G PO PACK: 17.0000 g | PACK | Freq: Every day | ORAL | Status: DC | PRN

## 2020-11-06 MED ORDER — INFLUENZA VAC SPLIT QUAD 0.5 ML IM SUSY
0.5000 mL | PREFILLED_SYRINGE | INTRAMUSCULAR | Status: AC
  Administered 2020-11-07: 0.5 mL via INTRAMUSCULAR
  Filled 2020-11-06: qty 0.5

## 2020-11-06 MED ORDER — ATORVASTATIN CALCIUM 40 MG PO TABS
40.0000 mg | ORAL_TABLET | Freq: Every day | ORAL | Status: DC
  Administered 2020-11-06 – 2020-11-09 (×4): 40 mg via ORAL
  Filled 2020-11-06 (×4): qty 1

## 2020-11-06 MED ORDER — PNEUMOCOCCAL VAC POLYVALENT 25 MCG/0.5ML IJ INJ
0.5000 mL | INJECTION | INTRAMUSCULAR | Status: AC
  Administered 2020-11-07: 0.5 mL via INTRAMUSCULAR
  Filled 2020-11-06: qty 0.5

## 2020-11-06 MED ORDER — OLANZAPINE 2.5 MG PO TABS
5.0000 mg | ORAL_TABLET | Freq: Every day | ORAL | Status: DC
  Administered 2020-11-06 – 2020-11-08 (×3): 5 mg via ORAL
  Filled 2020-11-06 (×3): qty 2

## 2020-11-06 MED ORDER — SIMETHICONE 80 MG PO CHEW
80.0000 mg | CHEWABLE_TABLET | Freq: Four times a day (QID) | ORAL | Status: DC | PRN
  Administered 2020-11-07: 80 mg via ORAL
  Filled 2020-11-06 (×2): qty 1

## 2020-11-06 MED ORDER — GABAPENTIN 100 MG PO CAPS
100.0000 mg | ORAL_CAPSULE | Freq: Two times a day (BID) | ORAL | Status: DC
  Administered 2020-11-06 – 2020-11-09 (×7): 100 mg via ORAL
  Filled 2020-11-06 (×7): qty 1

## 2020-11-06 MED ORDER — STROKE: EARLY STAGES OF RECOVERY BOOK
Freq: Once | Status: AC
  Filled 2020-11-06: qty 1

## 2020-11-06 MED ORDER — ENOXAPARIN SODIUM 40 MG/0.4ML ~~LOC~~ SOLN
40.0000 mg | SUBCUTANEOUS | Status: DC
  Administered 2020-11-07 – 2020-11-08 (×2): 40 mg via SUBCUTANEOUS
  Filled 2020-11-06 (×3): qty 0.4

## 2020-11-06 MED ORDER — ASPIRIN EC 81 MG PO TBEC
81.0000 mg | DELAYED_RELEASE_TABLET | Freq: Every day | ORAL | Status: DC
  Administered 2020-11-06 – 2020-11-09 (×4): 81 mg via ORAL
  Filled 2020-11-06 (×4): qty 1

## 2020-11-06 NOTE — Progress Notes (Signed)
PROGRESS NOTE  Brittne Kawasaki NFA:[id number] DOB: [birth year] DOA: 11/05/2020 PCP: Patient, No Pcp Per  HPI/Recap of past 84 hours: 50 year old female with past medical history of COPD, hypertension, depression, obesity who presented to [facility] emergency department due to concerns for left facial droop and left-sided weakness.  Last known well was at 2 AM in the morning of 11/05/2020.  Associated with slurred speech and severe headache.  Also reported epigastric abdominal discomfort of 1/2-week duration.  Patient was outside of TPA window.  Seen by neurology/stroke team Dr. Otelia Limes.  Admitted for stroke work-up.    MRI brain done on 11/05/2020 showed acute infarct in the left posterior midbrain in the roof of the fourth ventricle.  Small acute infarct right anterior thalamus.  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    11/06/20: She was seen and examined at her bedside.  She reports a headache 6 out of 10 in intensity also reports nausea with no vomiting.  Ongoing stroke work-up, 2D echo, OT speech evaluation are pending.  PT recommended home health PT with rolling walker with 5 inches weight   Assessment/Plan: Principal Problem:   Arterial ischemic stroke (HCC) Active Problems:   Severe episode of recurrent major depressive disorder, without psychotic features (HCC)   Cocaine use disorder, moderate, dependence (HCC)   Cannabis use disorder, moderate, dependence (HCC)   Epigastric pain   Palpitations   Essential hypertension   Ischemic stroke (HCC)  Acute ischemic CVA involving the left posterior midbrain and the roof of the fourth ventricle, small acute infarct right anterior thalamus 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 Seen by stroke team Ongoing stroke work-up MRI brain showed the above CT head and neck did not show any large vessels occlusion LDL 113, goal less than 70 A1c 5.5, goal less than 7.0, goal. Started on aspirin 81 mg daily and Lipitor  40 mg daily. PT recommended home health PT OT and speech therapist evaluations are pending. Continue neurochecks frequently Permissive hypertension at this time.  Gradually normalize in the next 4 to 5 days. Goal blood pressure normotensive  Hyperlipidemia Management as stated above  Ambulatory dysfunction in the setting of acute stroke Management as stated above.  Resolved AKI likely secondary to dehydration in the setting of poor oral intake. Baseline creatinine appears to be 1.0 with GFR greater than 60 Presented with creatinine of 1.39 with GFR 47 Creatinine is downtrending 1.1 with GFR greater than 60. Continue to monitor pulses, hypotension and dehydration. Monitor urine output Repeat renal panel in the morning.  History of polysubstance abuse including cocaine and THC Last UDS done on 08/07/2020 showed positive cocaine and positive marijuana, both POC. Obtain UDS  Code Status: Full code  Family Communication: Plan discussed with the patient who understands and agrees with the plan.  Disposition Plan: Likely will DC to home with home health services on 11/07/2020.   Consultants:  Neurology/stroke team.  Procedures:  None.  Antimicrobials:  None.  DVT prophylaxis: Subcu Lovenox daily  Status is: Inpatient    Dispo:  Patient From: Home  Planned Disposition: Inpatient Rehab  Expected discharge date: 11/08/2020  Medically stable for discharge: No, ongoing management of acute CVA.         Objective: Vitals:   11/06/20 0729 11/06/20 0929 11/06/20 0954 11/06/20 1142  BP: (!) 157/90 135/80 137/80 117/78  Pulse: 81 80 85 76  Resp: 18 18 17 17   Temp: 98.8 F (37.1 C) 98.2 F (36.8 C) 98.1  F (36.7 C) 98.3 F (36.8 C)  TempSrc: Oral Oral Oral Oral  SpO2: 100% 98% 97% 97%  Weight:      Height:        Intake/Output Summary (Last 24 hours) at 11/06/2020 1215 Last data filed at 11/06/2020 1007 Gross per 24 hour  Intake 150 ml  Output --  Net 150 ml    Filed Weights   11/05/20 1741 11/06/20 0529  Weight: 97.1 kg 102.9 kg    Exam:  . General: 50 y.o. year-old female well developed well nourished in no acute distress.  Alert and oriented x3. . Cardiovascular: Regular rate and rhythm with no rubs or gallops.  No thyromegaly or JVD noted.   Marland Kitchen Respiratory: Clear to auscultation with no wheezes or rales. Good inspiratory effort. . Abdomen: Soft nontender nondistended with normal bowel sounds x4 quadrants. . Musculoskeletal: No lower extremity edema.  Left-sided hemiparesis.  . Skin: No ulcerative lesions noted or rashes . Psychiatry: Mood is appropriate for condition and setting   Data Reviewed: CBC: Recent Labs  Lab 11/05/20 1806 11/05/20 1813  WBC 8.1  --   NEUTROABS 5.6  --   HGB 13.9 14.6  HCT 41.7 43.0  MCV 92.5  --   PLT 159  --    Basic Metabolic Panel: Recent Labs  Lab 11/05/20 1806 11/05/20 1813 11/06/20 0458  NA 138 140 142  K 4.1 4.1 3.8  CL 102 103 104  CO2 24  --  24  GLUCOSE 122* 119* 123*  BUN 14 18 14   CREATININE 1.39* 1.30* 1.11*  CALCIUM 9.3  --  9.0   GFR: Estimated Creatinine Clearance: 68.3 mL/min (A) (by C-G formula based on SCr of 1.11 mg/dL (H)). Liver Function Tests: Recent Labs  Lab 11/05/20 1806 11/06/20 0458  AST 20 15  ALT 12 11  ALKPHOS 61 55  BILITOT 0.7 0.6  PROT 6.5 6.0*  ALBUMIN 3.5 3.3*   Recent Labs  Lab 11/06/20 0614  LIPASE 27   No results for input(s): AMMONIA in the last 168 hours. Coagulation Profile: Recent Labs  Lab 11/05/20 1806  INR 0.9   Cardiac Enzymes: No results for input(s): CKTOTAL, CKMB, CKMBINDEX, TROPONINI in the last 168 hours. BNP (last 3 results) No results for input(s): PROBNP in the last 8760 hours. HbA1C: Recent Labs    11/06/20 0458  HGBA1C 5.5   CBG: Recent Labs  Lab 11/05/20 1805  GLUCAP 125*   Lipid Profile: Recent Labs    11/06/20 0458  CHOL 196  HDL 51  LDLCALC 113*  TRIG 162*  CHOLHDL 3.8   Thyroid  Function Tests: No results for input(s): TSH, T4TOTAL, FREET4, T3FREE, THYROIDAB in the last 72 hours. Anemia Panel: No results for input(s): VITAMINB12, FOLATE, FERRITIN, TIBC, IRON, RETICCTPCT in the last 72 hours. Urine analysis:    Component Value Date/Time   COLORURINE AMBER (A) 10/21/2019 1828   APPEARANCEUR CLOUDY (A) 10/21/2019 1828   LABSPEC >=1.030 08/07/2020 1139   PHURINE 6.0 08/07/2020 1139   GLUCOSEU NEGATIVE 08/07/2020 1139   HGBUR NEGATIVE 08/07/2020 1139   BILIRUBINUR NEGATIVE 08/07/2020 1139   KETONESUR NEGATIVE 08/07/2020 1139   PROTEINUR NEGATIVE 08/07/2020 1139   UROBILINOGEN 0.2 08/07/2020 1139   NITRITE NEGATIVE 08/07/2020 1139   LEUKOCYTESUR LARGE (A) 08/07/2020 1139   Sepsis Labs: @LABRCNTIP (procalcitonin:4,lacticidven:4)  ) Recent Results (from the past 240 hour(s))  Resp Panel by RT-PCR (Flu A&B, Covid) Nasopharyngeal Swab     Status: None   Collection Time:  11/05/20 10:09 PM   Specimen: Nasopharyngeal Swab; Nasopharyngeal(NP) swabs in vial transport medium  Result Value Ref Range Status   SARS Coronavirus 2 by RT PCR NEGATIVE NEGATIVE Final    Comment: (NOTE) SARS-CoV-2 target nucleic acids are NOT DETECTED.  The SARS-CoV-2 RNA is generally detectable in upper respiratory specimens during the acute phase of infection. The lowest concentration of SARS-CoV-2 viral copies this assay can detect is 138 copies/mL. A negative result does not preclude SARS-Cov-2 infection and should not be used as the sole basis for treatment or other patient management decisions. A negative result may occur with  improper specimen collection/handling, submission of specimen other than nasopharyngeal swab, presence of viral mutation(s) within the areas targeted by this assay, and inadequate number of viral copies(<138 copies/mL). A negative result must be combined with clinical observations, patient history, and epidemiological information. The expected result is  Negative.  Fact Sheet for Patients:  BloggerCourse.com  Fact Sheet for Healthcare Providers:  SeriousBroker.it  This test is no t yet approved or cleared by the [place] FDA and  has been authorized for detection and/or diagnosis of SARS-CoV-2 by FDA under an Emergency Use Authorization (EUA). This EUA will remain  in effect (meaning this test can be used) for the duration of the COVID-19 declaration under Section 564(b)(1) of the Act, 21 U.S.C.section 360bbb-3(b)(1), unless the authorization is terminated  or revoked sooner.       Influenza A by PCR NEGATIVE NEGATIVE Final   Influenza B by PCR NEGATIVE NEGATIVE Final    Comment: (NOTE) The Xpert Xpress SARS-CoV-2/FLU/RSV plus assay is intended as an aid in the diagnosis of influenza from Nasopharyngeal swab specimens and should not be used as a sole basis for treatment. Nasal washings and aspirates are unacceptable for Xpert Xpress SARS-CoV-2/FLU/RSV testing.  Fact Sheet for Patients: BloggerCourse.com  Fact Sheet for Healthcare Providers: SeriousBroker.it  This test is not yet approved or cleared by the [place] FDA and has been authorized for detection and/or diagnosis of SARS-CoV-2 by FDA under an Emergency Use Authorization (EUA). This EUA will remain in effect (meaning this test can be used) for the duration of the COVID-19 declaration under Section 564(b)(1) of the Act, 21 U.S.C. section 360bbb-3(b)(1), unless the authorization is terminated or revoked.  Performed at [facility], [address] [place], [place] [address]       Studies: CT Angio Head W or Wo Contrast  Result Date: 11/05/2020 CLINICAL DATA:  Acute neuro deficit. Left facial droop, headache and slurred speech EXAM: CT ANGIOGRAPHY HEAD AND NECK CT PERFUSION BRAIN TECHNIQUE: Multidetector CT imaging of the head and neck was performed  using the standard protocol during bolus administration of intravenous contrast. Multiplanar CT image reconstructions and MIPs were obtained to evaluate the vascular anatomy. Carotid stenosis measurements (when applicable) are obtained utilizing NASCET criteria, using the distal internal carotid diameter as the denominator. Multiphase CT imaging of the brain was performed following IV bolus contrast injection. Subsequent parametric perfusion maps were calculated using RAPID software. CONTRAST:  OMNIPAQUE IOHEXOL 350 MG/ML SOLN COMPARISON:  CT head 11/05/2020 FINDINGS: CTA NECK FINDINGS Aortic arch: Mild atherosclerotic calcification aortic arch. Proximal great vessels widely patent. Right carotid system: Mild atherosclerotic disease right carotid bifurcation without significant stenosis Left carotid system: Mild atherosclerotic disease left carotid bifurcation without stenosis. Vertebral arteries: Both vertebral arteries widely patent without stenosis. Skeleton: No acute skeletal abnormality. Other neck: Thyromegaly without focal lesion. Upper chest: Lung apices clear bilaterally. Review of  the MIP images confirms the above findings CTA HEAD FINDINGS Anterior circulation: Mild atherosclerotic calcification in the cavernous carotid bilaterally without stenosis. Anterior and middle cerebral arteries normal bilaterally without stenosis or large vessel occlusion. Posterior circulation: Both vertebral arteries patent to the basilar. Basilar widely patent. Superior cerebellar and posterior cerebral arteries patent bilaterally without stenosis. Small left PICA patent. Right PICA not visualized. AICA and superior cerebellar arteries patent bilaterally. Venous sinuses: Limited venous enhancement Anatomic variants: None Review of the MIP images confirms the above findings CT Brain Perfusion Findings: ASPECTS: 10 CBF (<30%) Volume: 87mL Perfusion (Tmax>6.0s) volume: 48mL Mismatch Volume: 42mL Infarction Location:None  IMPRESSION: 1. CT perfusion negative for acute infarct or ischemia 2. Negative for intracranial large vessel occlusion 3. No significant carotid or vertebral artery stenosis. 4. These results were called by telephone at the time of interpretation on 11/05/2020 at 7:03 pm to provider ERIC [facility] , who verbally acknowledged these results. Electronically Signed   By: Marlan Palau M.D.   On: 11/05/2020 19:04   CT Angio Neck W and/or Wo Contrast  Result Date: 11/05/2020 CLINICAL DATA:  Acute neuro deficit. Left facial droop, headache and slurred speech EXAM: CT ANGIOGRAPHY HEAD AND NECK CT PERFUSION BRAIN TECHNIQUE: Multidetector CT imaging of the head and neck was performed using the standard protocol during bolus administration of intravenous contrast. Multiplanar CT image reconstructions and MIPs were obtained to evaluate the vascular anatomy. Carotid stenosis measurements (when applicable) are obtained utilizing NASCET criteria, using the distal internal carotid diameter as the denominator. Multiphase CT imaging of the brain was performed following IV bolus contrast injection. Subsequent parametric perfusion maps were calculated using RAPID software. CONTRAST:  OMNIPAQUE IOHEXOL 350 MG/ML SOLN COMPARISON:  CT head 11/05/2020 FINDINGS: CTA NECK FINDINGS Aortic arch: Mild atherosclerotic calcification aortic arch. Proximal great vessels widely patent. Right carotid system: Mild atherosclerotic disease right carotid bifurcation without significant stenosis Left carotid system: Mild atherosclerotic disease left carotid bifurcation without stenosis. Vertebral arteries: Both vertebral arteries widely patent without stenosis. Skeleton: No acute skeletal abnormality. Other neck: Thyromegaly without focal lesion. Upper chest: Lung apices clear bilaterally. Review of the MIP images confirms the above findings CTA HEAD FINDINGS Anterior circulation: Mild atherosclerotic calcification in the cavernous carotid  bilaterally without stenosis. Anterior and middle cerebral arteries normal bilaterally without stenosis or large vessel occlusion. Posterior circulation: Both vertebral arteries patent to the basilar. Basilar widely patent. Superior cerebellar and posterior cerebral arteries patent bilaterally without stenosis. Small left PICA patent. Right PICA not visualized. AICA and superior cerebellar arteries patent bilaterally. Venous sinuses: Limited venous enhancement Anatomic variants: None Review of the MIP images confirms the above findings CT Brain Perfusion Findings: ASPECTS: 10 CBF (<30%) Volume: 23mL Perfusion (Tmax>6.0s) volume: 84mL Mismatch Volume: 26mL Infarction Location:None IMPRESSION: 1. CT perfusion negative for acute infarct or ischemia 2. Negative for intracranial large vessel occlusion 3. No significant carotid or vertebral artery stenosis. 4. These results were called by telephone at the time of interpretation on 11/05/2020 at 7:03 pm to provider ERIC [facility] , who verbally acknowledged these results. Electronically Signed   By: Marlan Palau M.D.   On: 11/05/2020 19:04   MR BRAIN W WO CONTRAST  Result Date: 11/05/2020 CLINICAL DATA:  Acute neuro deficit rule out stroke EXAM: MRI HEAD WITHOUT AND WITH CONTRAST TECHNIQUE: Multiplanar, multiecho pulse sequences of the brain and surrounding structures were obtained without and with intravenous contrast. CONTRAST:  9.84mL GADAVIST GADOBUTROL 1 MMOL/ML IV SOLN COMPARISON:  CT angio head and  CT perfusion 11/05/2020 FINDINGS: Brain: Small area of acute infarct in the left posterior midbrain in the roof of the fourth ventricle. Additional small area of acute infarct in the right thalamus. Minimal chronic white matter changes. 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 Negative for hemorrhage or mass. Normal enhancement postcontrast administration. Vascular: Normal arterial flow voids Skull and upper cervical spine: No focal skeletal abnormality. Sinuses/Orbits:  Mucosal edema left maxillary sinus. Mild mastoid effusion bilaterally. Normal orbit Other: None IMPRESSION: Acute infarct in the left posterior midbrain in the roof of the fourth ventricle. Small acute infarct right anterior thalamus. 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 Electronically Signed   By: Franchot Gallo M.D.   On: 11/05/2020 20:15   CT CEREBRAL PERFUSION W CONTRAST  Result Date: 11/05/2020 CLINICAL DATA:  Acute neuro deficit. Left facial droop, headache and slurred speech EXAM: CT ANGIOGRAPHY HEAD AND NECK CT PERFUSION BRAIN TECHNIQUE: Multidetector CT imaging of the head and neck was performed using the standard protocol during bolus administration of intravenous contrast. Multiplanar CT image reconstructions and MIPs were obtained to evaluate the vascular anatomy. Carotid stenosis measurements (when applicable) are obtained utilizing NASCET criteria, using the distal internal carotid diameter as the denominator. Multiphase CT imaging of the brain was performed following IV bolus contrast injection. Subsequent parametric perfusion maps were calculated using RAPID software. CONTRAST:  137mL OMNIPAQUE IOHEXOL 350 MG/ML SOLN COMPARISON:  CT head 11/05/2020 FINDINGS: CTA NECK FINDINGS Aortic arch: Mild atherosclerotic calcification aortic arch. Proximal great vessels widely patent. Right carotid system: Mild atherosclerotic disease right carotid bifurcation without significant stenosis Left carotid system: Mild atherosclerotic disease left carotid bifurcation without stenosis. Vertebral arteries: Both vertebral arteries widely patent without stenosis. Skeleton: No acute skeletal abnormality. Other neck: Thyromegaly without focal lesion. Upper chest: Lung apices clear bilaterally. Review of the MIP images confirms the above findings CTA HEAD FINDINGS Anterior circulation: Mild atherosclerotic calcification in the cavernous carotid bilaterally without stenosis. Anterior and middle cerebral arteries  normal bilaterally without stenosis or large vessel occlusion. Posterior circulation: Both vertebral arteries patent to the basilar. Basilar widely patent. Superior cerebellar and posterior cerebral arteries patent bilaterally without stenosis. Small left PICA patent. Right PICA not visualized. AICA and superior cerebellar arteries patent bilaterally. Venous sinuses: Limited venous enhancement Anatomic variants: None Review of the MIP images confirms the above findings CT Brain Perfusion Findings: ASPECTS: 10 CBF (<30%) Volume: 21mL Perfusion (Tmax>6.0s) volume: 20mL Mismatch Volume: 9mL Infarction Location:None IMPRESSION: 1. CT perfusion negative for acute infarct or ischemia 2. Negative for intracranial large vessel occlusion 3. No significant carotid or vertebral artery stenosis. 4. These results were called by telephone at the time of interpretation on 11/05/2020 at 7:03 pm to provider ERIC [facility] , who verbally acknowledged these results. Electronically Signed   By: Franchot Gallo M.D.   On: 11/05/2020 19:04   CT HEAD CODE STROKE WO CONTRAST  Result Date: 11/05/2020 CLINICAL DATA:  Code stroke. Acute neuro deficit. Left facial droop headache slurred speech EXAM: CT HEAD WITHOUT CONTRAST TECHNIQUE: Contiguous axial images were obtained from the base of the skull through the vertex without intravenous contrast. COMPARISON:  None. FINDINGS: Brain: No evidence of acute infarction, hemorrhage, hydrocephalus, extra-axial collection or mass lesion/mass effect. Vascular: Negative for hyperdense vessel Skull: Negative Sinuses/Orbits: Mild mucosal edema left maxillary sinus otherwise clear. Normal orbit Other: None ASPECTS ([facility] Stroke Program Early CT Score) - Ganglionic level infarction (caudate, lentiform nuclei, internal capsule, insula, M1-M3 cortex): 7 - Supraganglionic infarction (M4-M6 cortex): 3  Total score (0-10 with 10 being normal): 10 IMPRESSION: 1. Negative CT of the brain 2. ASPECTS is 10 3. Code  stroke imaging results were communicated on 11/05/2020 at 6:26 pm to provider Lindzen via text page Electronically Signed   By: Marlan Palau M.D.   On: 11/05/2020 18:27    Scheduled Meds: . aspirin EC  81 mg Oral Daily  . atorvastatin  40 mg Oral Daily  . FLUoxetine  10 mg Oral Daily  . gabapentin  100 mg Oral BID  . OLANZapine  5 mg Oral QHS  . pantoprazole (PROTONIX) IV  40 mg Intravenous QHS    Continuous Infusions:   LOS: 0 days     Darlin Drop, MD [facility] Pager [phone]  If 7PM-7AM, please contact night-coverage [email or web address] Password [facility] 11/06/2020, 12:15 PM

## 2020-11-06 NOTE — Evaluation (Signed)
Occupational Therapy Evaluation Patient Details Name: Cathy Hall MRN: [id number] DOB: 1971-01-22 Today's Date: 11/06/2020    History of Present Illness 50 year old female with past medical history of COPD, hypertension, depression, obesity who presented to [facility] emergency department due to concerns left facial droop, dysarthria and left sided weakness with vertigo. 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 Small acute infarct right anterior thalamus. 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 Today Pt is lethargic, able to perofrm bed mobility at supervision level, transfers with min A with RW, min A for sink level grooming, and set up for self-feeding. Pt has visual deficits - but was unable to keep eyes open long enough for visual assessment. Pt required cues throughout session to keep eyes open during functional tasks. Every time the patient coughs, she has loose stool. RN aware. OT will continue to follow acutely. Next session focus on visual assessment. For now, recommend HHOT - but should visual problems continue OPOT would be preferred if she has transportation.     Follow Up Recommendations  Home health OT;Supervision/Assistance - 24 hour    Equipment Recommendations  3 in 1 bedside commode    Recommendations for Other Services       Precautions / Restrictions Precautions Precautions: Fall Precaution Comments: BM with every cough Restrictions Weight Bearing Restrictions: No      Mobility Bed Mobility Overal bed mobility: Needs Assistance Bed Mobility: Supine to Sit;Sit to Supine     Supine to sit: Supervision;HOB elevated Sit to supine: Supervision;HOB elevated   General bed mobility comments: heavy use of bed rail    Transfers Overall transfer level: Needs assistance Equipment used:  Rolling walker (2 wheeled) Transfers: Sit to/from Stand Sit to Stand: Min assist         General transfer comment: vc for safety with RW use, Pt able to power up on her own    Balance Overall balance assessment: Needs assistance Sitting-balance support: No upper extremity supported;Feet supported Sitting balance-Leahy Scale: Good     Standing balance support: Single extremity supported;Bilateral upper extremity supported Standing balance-Leahy Scale: Poor Standing balance comment: reliant on UE support, leans on elbows at sink                           ADL either performed or assessed with clinical judgement   ADL Overall ADL's : Needs assistance/impaired Eating/Feeding: Set up;Sitting Eating/Feeding Details (indicate cue type and reason): EOB Grooming: Wash/dry hands;Min guard;Standing Grooming Details (indicate cue type and reason): leans against sink for balance Upper Body Bathing: Minimal assistance;Sitting   Lower Body Bathing: Maximal assistance;Sitting/lateral leans   Upper Body Dressing : Minimal assistance;Sitting   Lower Body Dressing: Moderate assistance;Sit to/from stand   Toilet Transfer: Minimal assistance;Ambulation;RW Toilet Transfer Details (indicate cue type and reason): assist with RW management Toileting- Clothing Manipulation and Hygiene: Moderate assistance;Sit to/from stand Toileting - Clothing Manipulation Details (indicate cue type and reason): able to complete from regular toilet, required assist for rear peri care from higher BSC     Functional mobility during ADLs: Min guard;Minimal assistance;Cueing for safety;Cueing for sequencing;Rolling walker General ADL Comments: decreased safety awareness, L sided weakness     Vision   Vision Assessment?: Vision impaired- to be further tested in functional context Additional Comments: attempted visual assessment and  Pt was unable to keep eyes open long enough     Perception     Praxis       Pertinent Vitals/Pain Pain Assessment: 0-10 Pain Score: 4  Pain Location: Bil Legs (whichever she is standing on is worst) Pain Descriptors / Indicators: Discomfort Pain Intervention(s): Monitored during session;Repositioned     Hand Dominance     Extremity/Trunk Assessment Upper Extremity Assessment Upper Extremity Assessment: LUE deficits/detail;Generalized weakness LUE Deficits / Details: strength grossly 4/5, slowed motor movements LUE Sensation: decreased light touch LUE Coordination: decreased fine motor   Lower Extremity Assessment Lower Extremity Assessment: Defer to PT evaluation   Cervical / Trunk Assessment Cervical / Trunk Assessment: Other exceptions Cervical / Trunk Exceptions: excess body habitus   Communication Communication Communication: No difficulties   Cognition Arousal/Alertness: Lethargic (fairly easy to arouse, but also as soon as she is horizontal she goes right back to sleep) Behavior During Therapy: WFL for tasks assessed/performed Overall Cognitive Status: Impaired/Different from baseline Area of Impairment: Safety/judgement;Awareness;Problem solving                         Safety/Judgement: Decreased awareness of deficits;Decreased awareness of safety Awareness: Emergent Problem Solving: Slow processing;Decreased initiation;Requires verbal cues;Difficulty sequencing General Comments: with functional tasks, Pt lack task initiation and awareness   General Comments  Pt with excess bowel movement this session, every time she coughs she has liquidy stool. cleaned up twice    Exercises     Shoulder Instructions      Home Living Family/patient expects to be discharged to:: Private residence Living Arrangements: Other relatives (grandchildren 12, 35, 8) Available Help at Discharge: Family;Available PRN/intermittently (dtr, works as a Engineer, civil (consulting)) Type of Home: Apartment Home Access: Level entry     Home Layout: One level      Bathroom Shower/Tub: Chief Strategy Officer: Standard     Home Equipment: None   Additional Comments: former CNA in [place]      Prior Functioning/Environment Level of Independence: Independent        Comments: likes reading the Bible, sleeping        OT Problem List: Decreased strength;Decreased activity tolerance;Impaired balance (sitting and/or standing);Impaired vision/perception;Decreased coordination;Decreased cognition;Decreased safety awareness;Impaired sensation;Impaired UE functional use;Obesity      OT Treatment/Interventions: Self-care/ADL training;Neuromuscular education;DME and/or AE instruction;Therapeutic activities;Visual/perceptual remediation/compensation;Patient/family education;Balance training    OT Goals(Current goals can be found in the care plan section) Acute Rehab OT Goals Patient Stated Goal: to return to independent OT Goal Formulation: With patient Time For Goal Achievement: 11/20/20 Potential to Achieve Goals: Good ADL Goals Pt Will Perform Grooming: with modified independence;standing Pt Will Perform Upper Body Dressing: with modified independence;sitting Pt Will Perform Lower Body Dressing: with modified independence;sit to/from stand Pt Will Transfer to Toilet: with modified independence;ambulating Pt Will Perform Toileting - Clothing Manipulation and hygiene: with modified independence;sit to/from stand  OT Frequency: Min 2X/week   Barriers to D/C:            Co-evaluation              AM-PAC OT "6 Clicks" Daily Activity     Outcome Measure Help from another person eating meals?: A Little Help from another person taking care of personal grooming?: A Little Help from another person toileting, which includes using toliet, bedpan, or urinal?: A Lot Help from another person bathing (including washing, rinsing, drying)?: A Little Help from another person to put on and taking off  regular upper body clothing?: A  Little Help from another person to put on and taking off regular lower body clothing?: A Little 6 Click Score: 17   End of Session Equipment Utilized During Treatment: Gait belt;Rolling walker Nurse Communication: Mobility status;Precautions;Other (comment) (stool, clean urine sample in bathroom)  Activity Tolerance: Patient tolerated treatment well Patient left: in bed;with call bell/phone within reach;with bed alarm set;with family/visitor present  OT Visit Diagnosis: Unsteadiness on feet (R26.81);Other abnormalities of gait and mobility (R26.89);Muscle weakness (generalized) (M62.81);Low vision, both eyes (H54.2);Other symptoms and signs involving the nervous system (R29.898);Hemiplegia and hemiparesis Hemiplegia - Right/Left: Left Hemiplegia - dominant/non-dominant: Non-Dominant Hemiplegia - caused by: Cerebral infarction                Time: 3212-2482 OT Time Calculation (min): 34 min Charges:  OT General Charges $OT Visit: 1 Visit OT Evaluation $OT Eval Moderate Complexity: 1 Mod OT Treatments $Self Care/Home Management : 8-22 mins  Nyoka Cowden OTR/L Acute Rehabilitation Services Pager: [phone] Office: [phone]  Evern Bio Nalaysia Manganiello 11/06/2020, 2:26 PM

## 2020-11-06 NOTE — Evaluation (Signed)
Physical Therapy Evaluation Patient Details Name: Cathy Hall MRN: [id number] DOB: 1971/05/19 Today's Date: 11/06/2020   History of Present Illness  50 year old female with past medical history of COPD, hypertension, depression, obesity who presented to [facility] emergency department due to concerns left facial droop, dysarthria and left sided weakness with vertigo. 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 Small acute infarct right anterior thalamus. 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  Clinical Impression  Pt presents to PT with deficits in functional mobility, gait, balance, vision, limited by nausea and dizziness this session. PT notes medial deviation of L eye at rest with dysconjugate gaze, likely contributing to symptoms of dizziness with mobility. Pt is unsteady and requires UE support to maintain balance at this time. Pt will benefit from aggressive mobilization and PT POC to improve mobility quality and to reduce falls risk. PT currently recommends discharge home with HHPT and a RW, pt will benefit from supervision for OOB mobility initially.    Follow Up Recommendations Home health PT;Supervision for mobility/OOB    Equipment Recommendations  Rolling walker with 5" wheels    Recommendations for Other Services       Precautions / Restrictions Precautions Precautions: Fall Restrictions Weight Bearing Restrictions: No      Mobility  Bed Mobility Overal bed mobility: Needs Assistance Bed Mobility: Supine to Sit;Sit to Supine     Supine to sit: Supervision;HOB elevated Sit to supine: Supervision;HOB elevated        Transfers Overall transfer level: Needs assistance Equipment used: 1 person hand held assist Transfers: Sit to/from [employer] Sit to Stand: Min assist Stand pivot transfers: Min guard          Ambulation/Gait Ambulation/Gait assistance: Min  assist;Min guard Gait Distance (Feet): 15 Feet (15' x 2) Assistive device: Rolling walker (2 wheeled);1 person hand held assist Gait Pattern/deviations: Step-to pattern Gait velocity: reduced Gait velocity interpretation: <1.31 ft/sec, indicative of household ambulator General Gait Details: pt with short step-to gait, initially needs hand hold and minA due to increased sway. Improved balance with BUE support of RW for 2nd bout of ambulation  Stairs            Wheelchair Mobility    Modified Rankin (Stroke Patients Only) Modified Rankin (Stroke Patients Only) Pre-Morbid Rankin Score: No symptoms Modified Rankin: Moderately severe disability     Balance Overall balance assessment: Needs assistance Sitting-balance support: No upper extremity supported;Feet supported Sitting balance-Leahy Scale: Good     Standing balance support: Single extremity supported;Bilateral upper extremity supported Standing balance-Leahy Scale: Poor Standing balance comment: reliant on UE support, leans on elbows at sink                             Pertinent Vitals/Pain Pain Assessment: Faces Faces Pain Scale: No hurt    Home Living Family/patient expects to be discharged to:: Private residence Living Arrangements: Other relatives (grandchildren 70, 68, 8) Available Help at Discharge: Family;Available PRN/intermittently (dtr, works as a Engineer, civil (consulting)) Type of Home: Apartment Home Access: Level entry     Home Layout: One level Home Equipment: None      Prior Function Level of Independence: Independent               Hand Dominance        Extremity/Trunk Assessment   Upper Extremity Assessment Upper Extremity Assessment: Defer to OT evaluation  Lower Extremity Assessment Lower Extremity Assessment: LLE deficits/detail LLE Deficits / Details: LLE grossly 4/5 LLE Sensation: decreased light touch    Cervical / Trunk Assessment Cervical / Trunk Assessment: Other  exceptions Cervical / Trunk Exceptions: excess body habitus  Communication   Communication: No difficulties  Cognition Arousal/Alertness: Lethargic (falling asleep with history initially, becomes more alert with mobility) Behavior During Therapy: WFL for tasks assessed/performed Overall Cognitive Status: Impaired/Different from baseline Area of Impairment: Safety/judgement;Awareness;Problem solving                         Safety/Judgement: Decreased awareness of deficits Awareness: Emergent Problem Solving: Slow processing        General Comments General comments (skin integrity, edema, etc.): VSS on RA, pt reports nausea and dizziness with mobility. PT notes dysconjugate gaze with excess medial deviation of L eye. No nystagmus noted with pursuits, saccades or VOR although these all aggravate symptoms    Exercises     Assessment/Plan    PT Assessment Patient needs continued PT services  PT Problem List Decreased strength;Decreased activity tolerance;Decreased mobility;Decreased balance;Decreased knowledge of use of DME;Decreased safety awareness;Impaired sensation       PT Treatment Interventions DME instruction;Gait training;Functional mobility training;Therapeutic activities;Therapeutic exercise;Balance training;Neuromuscular re-education;Patient/family education    PT Goals (Current goals can be found in the Care Plan section)  Acute Rehab PT Goals Patient Stated Goal: to return to independent mobility PT Goal Formulation: With patient Time For Goal Achievement: 11/20/20 Potential to Achieve Goals: Good Additional Goals Additional Goal #1: Pt will score >19/24 on DGI to indiciate a reduced risk for falls    Frequency Min 4X/week   Barriers to discharge        Co-evaluation               AM-PAC PT "6 Clicks" Mobility  Outcome Measure Help needed turning from your back to your side while in a flat bed without using bedrails?: A Little Help needed  moving from lying on your back to sitting on the side of a flat bed without using bedrails?: A Little Help needed moving to and from a bed to a chair (including a wheelchair)?: A Little Help needed standing up from a chair using your arms (e.g., wheelchair or bedside chair)?: A Little Help needed to walk in hospital room?: A Little Help needed climbing 3-5 steps with a railing? : A Little 6 Click Score: 18    End of Session   Activity Tolerance: Treatment limited secondary to medical complications (Comment) (nausea) Patient left: in bed;with call bell/phone within reach;with bed alarm set Nurse Communication: Mobility status PT Visit Diagnosis: Unsteadiness on feet (R26.81);Other abnormalities of gait and mobility (R26.89);Other symptoms and signs involving the nervous system (R29.898)    Time: 4010-2725 PT Time Calculation (min) (ACUTE ONLY): 14 min   Charges:   PT Evaluation $PT Eval Moderate Complexity: 1 Mod          Arlyss Gandy, PT, DPT Acute Rehabilitation Pager: [phone]   Arlyss Gandy 11/06/2020, 11:02 AM

## 2020-11-06 NOTE — Progress Notes (Addendum)
STROKE TEAM PROGRESS NOTE   HISTORY OF PRESENT ILLNESS (per record) Cathy Hall is an 50 y.o. female with a PMHx of anxiety, COPD, morbid obesity, PTSD, major depression and HTN, presenting from home via EMS for acute onset of left facial droop, dysarthria and left side. LKN was yesterday night at about 0200 when symptoms of left sided drooling and trouble swallowing food were first noted. Prior to that she had been at a [employer] party at which she was asymptomatic. She states that she has had intermittent spells of vertigo with lightheadedness for the past week and a half, and that with the drooling, she also had acute onset of the dizziness symptoms. She tried to eat and had to cut up her food into small pieces to get it down. She went to sleep and woke up with the same symptoms. She then noticed, at about 10 AM, left arm and leg weakness. Her boyfriend came home later in the day and convinced her that she needed to go to the ER. EMS was called. The patient is a poor historian and questions had to be asked multiple times for clarification on several of the events detailed above, including the time of symptom onset of 0200.  LSN: [id number] tPA Given: No: Out of time window   INTERVAL HISTORY Her  boy friend  And echo tech are at the bedside.   Patient is sleepy but can be aroused and follows commands.  I personally viewed history of presenting illness with the patient and boyfriend, electronic medical records and pertinent imaging films in PACS.  MRI scan of the brain shows tiny left midbrain and right medial thalamic lacunar infarcts.  CT angiogram shows no significant large vessel stenosis or occlusion.  LDL cholesterol is elevated at 113 mg percent and hemoglobin A1c is 5.3.   OBJECTIVE Vitals:   11/06/20 0415 11/06/20 0430 11/06/20 0529 11/06/20 0729  BP: (!) 136/102 127/83 (!) 157/98 (!) 157/90  Pulse: 86 95 81 81  Resp: 20 13 (!) 22 18  Temp: 97.7 F (36.5 C)  97.8 F (36.6 C)  98.8 F (37.1 C)  TempSrc: Oral  Oral Oral  SpO2: 96% 98% 100% 100%  Weight:   102.9 kg   Height:   5\' 1"  (1.549 m)     CBC:  Recent Labs  Lab 11/05/20 1806 11/05/20 1813  WBC 8.1  --   NEUTROABS 5.6  --   HGB 13.9 14.6  HCT 41.7 43.0  MCV 92.5  --   PLT 159  --     Basic Metabolic Panel:  Recent Labs  Lab 11/05/20 1806 11/05/20 1813 11/06/20 0458  NA 138 140 142  K 4.1 4.1 3.8  CL 102 103 104  CO2 24  --  24  GLUCOSE 122* 119* 123*  BUN 14 18 14   CREATININE 1.39* 1.30* 1.11*  CALCIUM 9.3  --  9.0    Lipid Panel:     Component Value Date/Time   CHOL 196 11/06/2020 0458   TRIG 162 (H) 11/06/2020 0458   HDL 51 11/06/2020 0458   CHOLHDL 3.8 11/06/2020 0458   VLDL 32 11/06/2020 0458   LDLCALC 113 (H) 11/06/2020 0458   HgbA1c:  Lab Results  Component Value Date   HGBA1C 5.5 11/06/2020   Urine Drug Screen: No results found for: LABOPIA, COCAINSCRNUR, LABBENZ, AMPHETMU, THCU, LABBARB  Alcohol Level     Component Value Date/Time   ETH <10 11/05/2020 1806    IMAGING  CT Angio Head W or Wo Contrast CT Angio Neck W and/or Wo Contrast CT CEREBRAL PERFUSION W CONTRAST 11/05/2020 IMPRESSION:  1. CT perfusion negative for acute infarct or ischemia  2. Negative for intracranial large vessel occlusion  3. No significant carotid or vertebral artery stenosis.   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 Small acute infarct right anterior thalamus. 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   CT HEAD CODE STROKE WO CONTRAST 11/05/2020 IMPRESSION:  1. Negative CT of the brain  2. ASPECTS is 10   Transthoracic Echocardiogram  00/00/2021 Pending  ECG - SR rate 88 BPM. (See cardiology reading for complete details)   PHYSICAL EXAM Blood pressure (!) 157/90, pulse 81, temperature 98.8 F (37.1 C), temperature source Oral, resp. rate 18, height 5\' 1"  (1.549 m), weight  102.9 kg, SpO2 100 %. Obese middle-aged African-American lady not in distress. . Afebrile. Head is nontraumatic. Neck is supple without bruit.    Cardiac exam no murmur or gallop. Lungs are clear to auscultation. Distal pulses are well felt. Neurological Exam ;  Awake  Alert oriented x 3.  Mildly dysarthric speech but no aphasia.  Eye movements full without nystagmus.fundi were not visualized. Vision acuity and fields appear normal. Hearing is normal. Palatal movements are normal. Face asymmetric with mild droop on the left. Tongue midline. Normal strength, tone, reflexes and coordination except mild left hemiparesis 4/5 strength.  Left grip is weak compared to the right.. Normal sensation. Gait deferred.      ASSESSMENT/PLAN [name] is a 50 y.o. female with history of anxiety, COPD, tobacco use, morbid obesity, PTSD, major depression and HTN, presenting from home via EMS for acute onset of left facial droop, dysarthria, dysphagia, left sided weakness and recent episodes of vertigo. She did not receive IV t-PA due to late presentation (>4.5 hours from time of onset)  Stroke: acute infarct in the left posterior midbrain in the roof of the fourth ventricle and small acute infarct right anterior thalamus - embolic - source unknown (recent palpitations)  Resultant dysarthria with mild left hemiparesis and dysarthria  Code Stroke CT Head - Negative CT of the brain. 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 Small acute infarct right anterior thalamus. 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   MRA head - not ordered  CTA H&N - Negative for intracranial large vessel occlusion. No significant carotid or vertebral artery stenosis.   CT Perfusion - CT perfusion negative for acute infarct or ischemia   Carotid Doppler - CTA neck ordered - carotid dopplers not indicated.  2D Echo  - pending  52 Virus 2 - negative  LDL - 113  HgbA1c - 5.5  UDS - pending  VTE prophylaxis - SCDs Diet  Diet Order            Diet Heart Room service appropriate? Yes; Fluid consistency: Thin  Diet effective now                  No antithrombotic prior to admission, now on aspirin 81 mg daily and Plavix 75 mg daily for 3 weeks followed by aspirin alone  Patient will be counseled to be compliant with her antithrombotic medications  Ongoing aggressive stroke risk factor management  Therapy recommendations:  pending  Disposition:  Pending  Hypertension  Home BP meds: Maxzide   Current BP meds: Hydralazine prn  Stable . Permissive hypertension (OK if < 220/120) but gradually normalize in 5-7 days  . Long-term BP goal normotensive  Hyperlipidemia  Home Lipid lowering medication: none  LDL 113, goal < 70  Current lipid lowering medication: Lipitor 40 mg daily   Continue statin at discharge  Other Stroke Risk Factors  Cigarette smoker - advised to stop smoking  ETOH use, advised to drink no more than 1 alcoholic beverage per day.  Obesity, Body mass index is 42.86 kg/m., recommend weight loss, diet and exercise as appropriate   Substance Abuse - cocaine and marijuana    Previous stroke by imaging  Other Active Problems, Findings and Recommendations Code status - Full code   AKI - 1.39->1.30->1.11   Recent palpitations  Hospital day # 0 She presented with midbrain and thalamic infarcts due to small vessel disease.  Recommend continue ongoing stroke work-up.  Aspirin 81 and Plavix 75 mg daily for 3 weeks followed by aspirin alone.  Continue ongoing stroke work-up.  Aggressive risk factor modification.  She appears to be at high risk for sleep apnea and may consider possible participation in the sleep smart study if interested.  Long discussion patient and boyfriend and answered questions.  Greater than 50% time during this 35-minute visit was  spent on counseling and coordination of care about her lacunar strokes and answering questions. Delia Heady, MD To contact Stroke Continuity provider, please refer to [email or web address]. After hours, contact General Neurology

## 2020-11-07 MED ORDER — PANTOPRAZOLE SODIUM 40 MG PO TBEC
40.0000 mg | DELAYED_RELEASE_TABLET | Freq: Every day | ORAL | Status: DC
  Administered 2020-11-07 – 2020-11-09 (×3): 40 mg via ORAL
  Filled 2020-11-07 (×3): qty 1

## 2020-11-07 MED ORDER — AMLODIPINE BESYLATE 5 MG PO TABS
5.0000 mg | ORAL_TABLET | Freq: Every day | ORAL | Status: DC
  Administered 2020-11-07 – 2020-11-09 (×3): 5 mg via ORAL
  Filled 2020-11-07 (×3): qty 1

## 2020-11-07 MED ORDER — TRAMADOL HCL 50 MG PO TABS
50.0000 mg | ORAL_TABLET | Freq: Four times a day (QID) | ORAL | Status: DC | PRN
  Administered 2020-11-08 (×2): 50 mg via ORAL
  Filled 2020-11-07 (×2): qty 1

## 2020-11-07 NOTE — Progress Notes (Signed)
Physical Therapy Treatment Patient Details Name: Cathy Hall MRN: [id number] DOB: May 26, 1971 Today's Date: 11/07/2020    History of Present Illness 50 year old female with past medical history of COPD, hypertension, depression, obesity who presented to [facility] emergency department due to concerns left facial droop, dysarthria and left sided weakness with vertigo. 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 Small acute infarct right anterior thalamus. 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    PT Comments    Pt ambulation tolerance and independence with transfers are limited by onset of room spinning due to stroke. Pt continues with significant lateral sway and inability to focus during ambulation causing pt to be at signficant falls risk. Pt unsafe to return home alone unless 24/7 assist can be provided. Suspect once "room spinning" sensation ceases pt will progress quickly. Acute PT to cont to follow.    Follow Up Recommendations  Home health PT;Supervision/Assistance - 24 hour (will need SNF if 24/7 assist can't be provided)     Equipment Recommendations  Rolling walker with 5" wheels    Recommendations for Other Services       Precautions / Restrictions Precautions Precautions: Fall Restrictions Weight Bearing Restrictions: No    Mobility  Bed Mobility               General bed mobility comments: pt sitting EOB upon PT arrival  Transfers Overall transfer level: Needs assistance Equipment used: Rolling walker (2 wheeled) Transfers: Sit to/from Stand Sit to Stand: Min assist         General transfer comment: verbal cues for safety, min A to steady as pt c/o "room spinning"  Ambulation/Gait Ambulation/Gait assistance: Min assist Gait Distance (Feet): 60 Feet Assistive device: Rolling walker (2 wheeled);1 person hand held assist Gait Pattern/deviations: Step-to pattern Gait  velocity: slow Gait velocity interpretation: <1.8 ft/sec, indicate of risk for recurrent falls General Gait Details: pt with significant lateral sway even with rolling walker, short steps, increased dependence on rolling walker, c/o "why is the room spinny every time I"m up" pt re-educated on where stroke occured and pt states "oh yes i remember"   Stairs             Wheelchair Mobility    Modified Rankin (Stroke Patients Only) Modified Rankin (Stroke Patients Only) Pre-Morbid Rankin Score: Slight disability     Balance Overall balance assessment: Needs assistance Sitting-balance support: No upper extremity supported;Feet supported Sitting balance-Leahy Scale: Good     Standing balance support: Single extremity supported;Bilateral upper extremity supported Standing balance-Leahy Scale: Poor Standing balance comment: reliant on UE support, leans on elbows at sink                            Cognition Arousal/Alertness: Awake/alert Behavior During Therapy: Flat affect Overall Cognitive Status: Impaired/Different from baseline Area of Impairment: Problem solving                           Awareness: Emergent Problem Solving: Slow processing;Requires verbal cues;Requires tactile cues General Comments: pt aware that the room is spinning due to where her stroke was      Exercises      General Comments General comments (skin integrity, edema, etc.): pt was on [facility] with RN tech, pt able to perform pericare, VSS      Pertinent Vitals/Pain Pain Assessment: 0-10 Pain  Score: 6  Pain Location: bilat thighs and headache Pain Descriptors / Indicators: Discomfort;Headache Pain Intervention(s): Monitored during session    Home Living                      Prior Function            PT Goals (current goals can now be found in the care plan section) Progress towards PT goals: Progressing toward goals    Frequency    Min 4X/week       PT Plan Current plan remains appropriate    Co-evaluation              AM-PAC PT "6 Clicks" Mobility   Outcome Measure  Help needed turning from your back to your side while in a flat bed without using bedrails?: A Little Help needed moving from lying on your back to sitting on the side of a flat bed without using bedrails?: A Little Help needed moving to and from a bed to a chair (including a wheelchair)?: A Little Help needed standing up from a chair using your arms (e.g., wheelchair or bedside chair)?: A Little Help needed to walk in hospital room?: A Little Help needed climbing 3-5 steps with a railing? : A Little 6 Click Score: 18    End of Session Equipment Utilized During Treatment: Gait belt Activity Tolerance: Other (comment) (limited by room spinning) Patient left: in chair;with call bell/phone within reach;with chair alarm set Nurse Communication: Mobility status PT Visit Diagnosis: Unsteadiness on feet (R26.81);Other abnormalities of gait and mobility (R26.89);Other symptoms and signs involving the nervous system (R29.898)     Time: 9417-4081 PT Time Calculation (min) (ACUTE ONLY): 15 min  Charges:  $Gait Training: 8-22 mins                     Lewis Shock, PT, DPT Acute Rehabilitation Services Pager #: [phone] Office #: [phone]    Iona Hansen 11/07/2020, 12:23 PM

## 2020-11-07 NOTE — Care Management (Signed)
3:13pm Case manager spoke with [employer] Liaison with [facility] [address], she states that the [facility] states they have met capacity for charity at this time. TOC Team will continue to work on [employer]. Ricki Miller, RN BSN Case Manager

## 2020-11-07 NOTE — TOC Initial Note (Signed)
Transition of Care [facility]) - Initial/Assessment Note    Patient Details  Name: Cathy Hall MRN: [id number] Date of Birth: October 02, 1971  [facility] CM/SW Contact:    Durenda Guthrie, RN Phone Number: 11/07/2020, 2:50 PM  Clinical Narrative:   50 yr old female s/p CVA. Patient from home with significant other. Case manager spoke with patient concerning discharge needs. Patient states she lives with her boyfriend and her works from 7:30am - 4pm daily.,has a son in [name] and one son recently murdered. CM explained concerns about patient being unattended for extended hours. Will follow up with therapist. Patient will need [facility] therapy and will need RW if home is decided. TOC team will continue to monitor.                  Barriers to Discharge: Continued Medical Work up   Patient Goals and CMS Choice        Expected Discharge Plan and Services                                                Prior Living Arrangements/Services                       Activities of Daily Living      Permission Sought/Granted                  Emotional Assessment              Admission diagnosis:  Arterial ischemic stroke [facility]) [I63.9] Acute stroke due to ischemia [facility]) [I63.9] Ischemic stroke [facility]) [I63.9] Patient Active Problem List   Diagnosis Date Noted  . Ischemic stroke (HCC) 11/06/2020  . Arterial ischemic stroke (HCC) 11/05/2020  . Epigastric pain 11/05/2020  . Palpitations 11/05/2020  . Essential hypertension 11/05/2020  . Cocaine use disorder, moderate, dependence (HCC) 08/09/2020  . Cannabis use disorder, moderate, dependence (HCC) 08/09/2020  . Severe episode of recurrent major depressive disorder, without psychotic features (HCC)    PCP:  Patient, No Pcp Per Pharmacy:   [facility] - [place], [place] - [address] [place] [address] Phone: [phone] Fax:  [phone]  [id number] - [place], [place] - [address] AT [address] [facility] [address] [place] [address] Phone: [phone] Fax: [phone]     Social Determinants of Health (SDOH) Interventions    Readmission Risk Interventions No flowsheet data found.

## 2020-11-07 NOTE — Progress Notes (Signed)
STROKE TEAM PROGRESS NOTE       INTERVAL HISTORY Patient is lying in bed comfortably.  She states she is feeling better.  She still gets dizzy when she sits up.  She denies nausea headache or vomiting.  She has been seen by therapies recommend home health PT and OT.  No new complaints.  Neurological exam unchanged.  Vital signs stable.   OBJECTIVE Vitals:   11/07/20 0021 11/07/20 0333 11/07/20 0738 11/07/20 1203  BP: (!) 128/91 (!) 143/94 (!) 152/102 (!) 165/115  Pulse: 76 77 82 84  Resp: 18 20 (!) 22 20  Temp: 98.3 F (36.8 C) 98.4 F (36.9 C) 98.1 F (36.7 C) 98.3 F (36.8 C)  TempSrc: Oral Oral Oral Oral  SpO2: 97% 97% 96% 98%  Weight:      Height:        CBC:  Recent Labs  Lab 11/05/20 1806 11/05/20 1813  WBC 8.1  --   NEUTROABS 5.6  --   HGB 13.9 14.6  HCT 41.7 43.0  MCV 92.5  --   PLT 159  --     Basic Metabolic Panel:  Recent Labs  Lab 11/05/20 1806 11/05/20 1813 11/06/20 0458  NA 138 140 142  K 4.1 4.1 3.8  CL 102 103 104  CO2 24  --  24  GLUCOSE 122* 119* 123*  BUN 14 18 14   CREATININE 1.39* 1.30* 1.11*  CALCIUM 9.3  --  9.0    Lipid Panel:     Component Value Date/Time   CHOL 196 11/06/2020 0458   TRIG 162 (H) 11/06/2020 0458   HDL 51 11/06/2020 0458   CHOLHDL 3.8 11/06/2020 0458   VLDL 32 11/06/2020 0458   LDLCALC 113 (H) 11/06/2020 0458   HgbA1c:  Lab Results  Component Value Date   HGBA1C 5.5 11/06/2020   Urine Drug Screen:     Component Value Date/Time   LABOPIA NONE DETECTED 11/06/2020 1334   COCAINSCRNUR POSITIVE (A) 11/06/2020 1334   LABBENZ NONE DETECTED 11/06/2020 1334   AMPHETMU NONE DETECTED 11/06/2020 1334   THCU POSITIVE (A) 11/06/2020 1334   LABBARB NONE DETECTED 11/06/2020 1334    Alcohol Level     Component Value Date/Time   ETH <10 11/05/2020 1806    IMAGING  CT Angio Head W or Wo Contrast CT Angio Neck W and/or Wo Contrast CT CEREBRAL PERFUSION W CONTRAST 11/05/2020 IMPRESSION:  1. CT perfusion  negative for acute infarct or ischemia  2. Negative for intracranial large vessel occlusion  3. No significant carotid or vertebral artery stenosis.   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 Small acute infarct right anterior thalamus. 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   CT HEAD CODE STROKE WO CONTRAST 11/05/2020 IMPRESSION:  1. Negative CT of the brain  2. ASPECTS is 10   Transthoracic Echocardiogram  Normal ejection fraction 50 to 55%.  No cardiac source of embolism. ECG - SR rate 88 BPM. (See cardiology reading for complete details)   PHYSICAL EXAM Blood pressure (!) 165/115, pulse 84, temperature 98.3 F (36.8 C), temperature source Oral, resp. rate 20, height 5\' 1"  (1.549 m), weight 102.9 kg, SpO2 98 %. Obese middle-aged African-American lady not in distress. . Afebrile. Head is nontraumatic. Neck is supple without bruit.    Cardiac exam no murmur or gallop. Lungs are clear to auscultation. Distal pulses are well felt. Neurological Exam ;  Awake  Alert oriented x 3.  Minimal dysarthria.  No aphasia.  Eye movements full without nystagmus.fundi were not visualized. Vision acuity and fields appear normal. Hearing is normal. Palatal movements are normal. Face asymmetric with mild droop on the left. Tongue midline. Normal strength, tone, reflexes and coordination except mild left hemiparesis 4/5 strength.  Left grip is weak compared to the right.. Normal sensation. Gait deferred.      ASSESSMENT/PLAN Cathy Hall is a 50 y.o. female with history of anxiety, COPD, tobacco use, morbid obesity, PTSD, major depression and HTN, presenting from home via EMS for acute onset of left facial droop, dysarthria, dysphagia, left sided weakness and recent episodes of vertigo. She did not receive IV t-PA due to late presentation (>4.5 hours from time of onset)  Stroke: acute infarct  in the left posterior midbrain in the roof of the fourth ventricle and small acute infarct right anterior thalamus - embolic - source unknown (recent palpitations)  Resultant dysarthria with mild left hemiparesis and dysarthria  Code Stroke CT Head - Negative CT of the brain. 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 Small acute infarct right anterior thalamus. 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   MRA head - not ordered  CTA H&N - Negative for intracranial large vessel occlusion. No significant carotid or vertebral artery stenosis.   CT Perfusion - CT perfusion negative for acute infarct or ischemia   Carotid Doppler - CTA neck ordered - carotid dopplers not indicated.  2D Echo -EF 50-55%.  No cardiac source of embolism.  Sars Corona Virus 2 - negative  LDL - 113  HgbA1c - 5.5  UDS -positive for cocaine and cannabis  VTE prophylaxis - SCDs Diet  Diet Order            Diet Heart Room service appropriate? No; Fluid consistency: Thin  Diet effective now                 No antithrombotic prior to admission, now on aspirin 81 mg daily and Plavix 75 mg daily for 3 weeks followed by aspirin alone  Patient will be counseled to be compliant with her antithrombotic medications  Ongoing aggressive stroke risk factor management  Therapy recommendations: Home PT/OT  Disposition: Home  Hypertension  Home BP meds: Maxzide   Current BP meds: Hydralazine prn  Stable . Permissive hypertension (OK if < 220/120) but gradually normalize in 5-7 days  . Long-term BP goal normotensive  Hyperlipidemia  Home Lipid lowering medication: none  LDL 113, goal < 70  Current lipid lowering medication: Lipitor 40 mg daily   Continue statin at discharge  Other Stroke Risk Factors  Cigarette smoker - advised to stop smoking  ETOH use, advised to drink no more than 1 alcoholic  beverage per day.  Obesity, Body mass index is 42.86 kg/m., recommend weight loss, diet and exercise as appropriate   Substance Abuse - cocaine and marijuana    Previous stroke by imaging  Other Active Problems, Findings and Recommendations Code status - Full code   AKI - 1.39->1.30->1.11   Recent palpitations  Hospital day # 1 She presented with midbrain and thalamic infarcts due to small vessel disease.  Recommend continue ongoing stroke work-up.  Aspirin 81 and Plavix 75 mg daily for 3 weeks followed by aspirin alone.     Aggressive risk factor  modification.  Patient counseled to quit smoking cigarettes, marijuana and using cocaine.  She may be discharged home.  Will consider outpatient evaluation for sleep apnea after discharge.  Follow-up as an outpatient stroke clinic in 6 weeks greater than 50% time during this 25-minute visit was spent on counseling and coordination of care about her lacunar strokes and answering questions.  Stroke team will sign off.  Follow-up as an outpatient stroke clinic in 6 weeks kindly call for questions.  Discussed with Dr. Jorge Ny, MD To contact Stroke Continuity provider, please refer to [email or web address]. After hours, contact General Neurology

## 2020-11-07 NOTE — Progress Notes (Signed)
PROGRESS NOTE  Cathy Hall ZMO:[id number] DOB: 01/02/1971 DOA: 11/05/2020 PCP: Patient, No Pcp Per  HPI/Recap of past 31 hours: 50 year old female with past medical history of COPD, hypertension, depression, obesity who presented to [facility] emergency department due to concerns for left facial droop and left-sided weakness.  Last known well was at 2 AM in the morning of 11/05/2020.  Associated with slurred speech and severe headache.  Also reported epigastric abdominal discomfort of 1/2-week duration.  Patient was outside of TPA window.  Seen by neurology/stroke team Dr. Otelia Limes.  Admitted for stroke work-up.    MRI brain done on 11/05/2020 showed acute infarct in the left posterior midbrain in the roof of the fourth ventricle.  Small acute infarct right anterior thalamus.  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    2D echo showed normal LVEF 50 to 55%.  No cardiac source of embolism.  COVID-19 negative.  UDS positive for cocaine and cannabis.   11/07/20: Patient was seen and examined at her bedside.  Her blood pressure significantly elevated.  Will start Norvasc 5 mg daily.  Gradual normalization of BP 5 to 7 days post diagnosis of stroke.  Long-term goal normotensive.  Seen by neurology/stroke team, signed off.    She will need to follow-up outpatient with neurology for evaluation of sleep apnea.  Plan is to continue aspirin 81 mg and Plavix 75 mg daily for 3 weeks and then aspirin alone.  Lipitor 40 mg daily.  Assessment/Plan: Principal Problem:   Arterial ischemic stroke [facility]) Active Problems:   Severe episode of recurrent major depressive disorder, without psychotic features (HCC)   Cocaine use disorder, moderate, dependence (HCC)   Cannabis use disorder, moderate, dependence (HCC)   Epigastric pain   Palpitations   Essential hypertension   Ischemic stroke (HCC)  Acute ischemic CVA involving the left posterior midbrain and the roof of the fourth  ventricle, small acute infarct right anterior thalamus 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 Seen by stroke team Ongoing stroke work-up MRI brain showed the above CT head and neck did not show any large vessels occlusion LDL 113, goal less than 70 A1c 5.5, goal less than 7.0, goal. Plan is to continue aspirin 81 mg and Plavix 75 mg daily for 3 weeks and then aspirin alone.  Lipitor 40 mg daily. PT recommended home health PT OT and speech therapist evaluations are pending. Gradually normalize BP Start Norvasc 5 mg daily Long-term goal blood pressure normotensive She will need to follow-up outpatient with neurology for evaluation of sleep apnea which can contribute to uncontrolled hypertension.  Elevated BP, suspect underlying hypertension Not previously on BP medications Started Norvasc 5 mg daily on 11/07/2020 Creatinine normalization of BP 5 to 7 days post stroke Continue to closely monitor vital signs  Suspected OSA She will need to follow-up outpatient with neurology for evaluation of sleep apnea.  Polysubstance abuse including cocaine and THC UDS done on 11/06/2020 positive for cocaine and THC Polysubstance cessation counseling  Hyperlipidemia Management as stated above  Ambulatory dysfunction in the setting of acute stroke Management as stated above.  Resolved AKI likely secondary to dehydration in the setting of poor oral intake. Baseline creatinine appears to be 1.0 with GFR greater than 60 Presented with creatinine of 1.39 with GFR 47 Creatinine is downtrending 1.1 with GFR greater than 60. Continue to monitor pulses, hypotension and dehydration. Monitor urine output Repeat renal panel in the morning.  Code Status: Full code  Family  Communication: Plan discussed with the patient who understands and agrees with the plan.  Disposition Plan: Likely will DC to home with home health services on 11/07/2020.   Consultants:  Neurology/stroke  team.  Procedures:  None.  Antimicrobials:  None.  DVT prophylaxis: Subcu Lovenox daily  Status is: Inpatient    Dispo:  Patient From: Home  Planned Disposition: Home with Health Care Svc  Expected discharge date: 11/08/2020  Medically stable for discharge: No, ongoing management of acute CVA.         Objective: Vitals:   11/07/20 0021 11/07/20 0333 11/07/20 0738 11/07/20 1203  BP: (!) 128/91 (!) 143/94 (!) 152/102 (!) 165/115  Pulse: 76 77 82 84  Resp: 18 20 (!) 22 20  Temp: 98.3 F (36.8 C) 98.4 F (36.9 C) 98.1 F (36.7 C) 98.3 F (36.8 C)  TempSrc: Oral Oral Oral Oral  SpO2: 97% 97% 96% 98%  Weight:      Height:       No intake or output data in the 24 hours ending 11/07/20 1413 Filed Weights   11/05/20 1741 11/06/20 0529  Weight: 97.1 kg 102.9 kg    Exam:  . General: 50 y.o. year-old female well-developed well-nourished in no acute stress.  Alert and oriented x3. . Cardiovascular: Regular rate and rhythm no rubs or rubs.  Marland Kitchen Respiratory: Clear to auscultation no wheezes or rales.. . Abdomen: Some of the normal bowel sounds present . Musculoskeletal: No lower extremity edema.  Left-sided hemiparesis.  Marland Kitchen Psychiatry: Mood is appropriate for condition and setting.  Data Reviewed: CBC: Recent Labs  Lab 11/05/20 1806 11/05/20 1813  WBC 8.1  --   NEUTROABS 5.6  --   HGB 13.9 14.6  HCT 41.7 43.0  MCV 92.5  --   PLT 159  --    Basic Metabolic Panel: Recent Labs  Lab 11/05/20 1806 11/05/20 1813 11/06/20 0458  NA 138 140 142  K 4.1 4.1 3.8  CL 102 103 104  CO2 24  --  24  GLUCOSE 122* 119* 123*  BUN 14 18 14   CREATININE 1.39* 1.30* 1.11*  CALCIUM 9.3  --  9.0   GFR: Estimated Creatinine Clearance: 68.3 mL/min (A) (by C-G formula based on SCr of 1.11 mg/dL (H)). Liver Function Tests: Recent Labs  Lab 11/05/20 1806 11/06/20 0458  AST 20 15  ALT 12 11  ALKPHOS 61 55  BILITOT 0.7 0.6  PROT 6.5 6.0*  ALBUMIN 3.5 3.3*   Recent  Labs  Lab 11/06/20 0614  LIPASE 27   No results for input(s): AMMONIA in the last 168 hours. Coagulation Profile: Recent Labs  Lab 11/05/20 1806  INR 0.9   Cardiac Enzymes: No results for input(s): CKTOTAL, CKMB, CKMBINDEX, TROPONINI in the last 168 hours. BNP (last 3 results) No results for input(s): PROBNP in the last 8760 hours. HbA1C: Recent Labs    11/06/20 0458  HGBA1C 5.5   CBG: Recent Labs  Lab 11/05/20 1805  GLUCAP 125*   Lipid Profile: Recent Labs    11/06/20 0458  CHOL 196  HDL 51  LDLCALC 113*  TRIG 162*  CHOLHDL 3.8   Thyroid Function Tests: No results for input(s): TSH, T4TOTAL, FREET4, T3FREE, THYROIDAB in the last 72 hours. Anemia Panel: No results for input(s): VITAMINB12, FOLATE, FERRITIN, TIBC, IRON, RETICCTPCT in the last 72 hours. Urine analysis:    Component Value Date/Time   COLORURINE YELLOW 11/06/2020 1334   APPEARANCEUR CLEAR 11/06/2020 1334   LABSPEC 1.013 11/06/2020  [place] 6.0 11/06/2020 1334   [place] 11/06/2020 1334   HGBUR NEGATIVE 11/06/2020 1334   [place] 11/06/2020 1334   [place] 11/06/2020 1334   PROTEINUR NEGATIVE 11/06/2020 1334   UROBILINOGEN 0.2 08/07/2020 1139   NITRITE NEGATIVE 11/06/2020 1334   LEUKOCYTESUR TRACE (A) 11/06/2020 1334   Sepsis Labs: @LABRCNTIP (procalcitonin:4,lacticidven:4)  ) Recent Results (from the past 240 hour(s))  Resp Panel by RT-PCR (Flu A&B, Covid) Nasopharyngeal Swab     Status: None   Collection Time: 11/05/20 10:09 PM   Specimen: Nasopharyngeal Swab; Nasopharyngeal(NP) swabs in vial transport medium  Result Value Ref Range Status   SARS Coronavirus 2 by RT PCR NEGATIVE NEGATIVE Final    Comment: (NOTE) SARS-CoV-2 target nucleic acids are NOT DETECTED.  The SARS-CoV-2 RNA is generally detectable in upper respiratory specimens during the acute phase of infection. The lowest concentration of SARS-CoV-2 viral copies this assay can detect  is 138 copies/mL. A negative result does not preclude SARS-Cov-2 infection and should not be used as the sole basis for treatment or other patient management decisions. A negative result may occur with  improper specimen collection/handling, submission of specimen other than nasopharyngeal swab, presence of viral mutation(s) within the areas targeted by this assay, and inadequate number of viral copies(<138 copies/mL). A negative result must be combined with clinical observations, patient history, and epidemiological information. The expected result is Negative.  Fact Sheet for Patients:  EntrepreneurPulse.com.au  Fact Sheet for Healthcare Providers:  IncredibleEmployment.be  This test is no t yet approved or cleared by the [place] FDA and  has been authorized for detection and/or diagnosis of SARS-CoV-2 by FDA under an Emergency Use Authorization (EUA). This EUA will remain  in effect (meaning this test can be used) for the duration of the COVID-19 declaration under Section 564(b)(1) of the Act, 21 U.S.C.section 360bbb-3(b)(1), unless the authorization is terminated  or revoked sooner.       Influenza A by PCR NEGATIVE NEGATIVE Final   Influenza B by PCR NEGATIVE NEGATIVE Final    Comment: (NOTE) The Xpert Xpress SARS-CoV-2/FLU/RSV plus assay is intended as an aid in the diagnosis of influenza from Nasopharyngeal swab specimens and should not be used as a sole basis for treatment. Nasal washings and aspirates are unacceptable for Xpert Xpress SARS-CoV-2/FLU/RSV testing.  Fact Sheet for Patients: EntrepreneurPulse.com.au  Fact Sheet for Healthcare Providers: IncredibleEmployment.be  This test is not yet approved or cleared by the [place] FDA and has been authorized for detection and/or diagnosis of SARS-CoV-2 by FDA under an Emergency Use Authorization (EUA). This EUA will remain in effect  (meaning this test can be used) for the duration of the COVID-19 declaration under Section 564(b)(1) of the Act, 21 U.S.C. section 360bbb-3(b)(1), unless the authorization is terminated or revoked.  Performed at [facility], [place] [address], [place], [place] [address]       Studies: ECHOCARDIOGRAM COMPLETE BUBBLE STUDY  Result Date: 11/06/2020    ECHOCARDIOGRAM REPORT   Patient Name:   RISHITA PETRON [facility] Date of Exam: 11/06/2020 Medical Rec #:  [id number]                   Height:       61.0 in Accession #:    [id number]                  Weight:       226.9 lb Date of Birth:  1971/08/01  BSA:          1.993 m Patient Age:    49 years                    BP:           118/75 mmHg Patient Gender: F                           HR:           74 bpm. Exam Location:  Inpatient Procedure: 2D Echo and Saline Contrast Bubble Study Indications:    stroke 434.91  History:        Patient has no prior history of Echocardiogram examinations.                 Risk Factors:Hypertension and cocaine use.  Sonographer:    Delcie Roch Referring Phys: [id number] Deno Lunger SHALHOUB IMPRESSIONS  1. Left ventricular ejection fraction, by estimation, is 55 to 60%. The left ventricle has normal function. The left ventricle has no regional wall motion abnormalities. There is mild left ventricular hypertrophy. Left ventricular diastolic parameters were normal.  2. Right ventricular systolic function is normal. The right ventricular size is normal. Tricuspid regurgitation signal is inadequate for assessing PA pressure.  3. The mitral valve is grossly normal. Trivial mitral valve regurgitation.  4. The aortic valve is tricuspid. Aortic valve regurgitation is not visualized.  5. The inferior vena cava is normal in size with greater than 50% respiratory variability, suggesting right atrial pressure of 3 mmHg.  6. Agitated saline contrast bubble study was negative, with no evidence of any interatrial shunt.  FINDINGS  Left Ventricle: Left ventricular ejection fraction, by estimation, is 55 to 60%. The left ventricle has normal function. The left ventricle has no regional wall motion abnormalities. The left ventricular internal cavity size was normal in size. There is  mild left ventricular hypertrophy. Left ventricular diastolic parameters were normal. Right Ventricle: The right ventricular size is normal. No increase in right ventricular wall thickness. Right ventricular systolic function is normal. Tricuspid regurgitation signal is inadequate for assessing PA pressure. Left Atrium: Left atrial size was normal in size. Right Atrium: Right atrial size was normal in size. Pericardium: There is no evidence of pericardial effusion. Mitral Valve: The mitral valve is grossly normal. Mild mitral annular calcification. Trivial mitral valve regurgitation. Tricuspid Valve: The tricuspid valve is grossly normal. Tricuspid valve regurgitation is trivial. Aortic Valve: The aortic valve is tricuspid. There is mild aortic valve annular calcification. Aortic valve regurgitation is not visualized. Pulmonic Valve: The pulmonic valve was grossly normal. Pulmonic valve regurgitation is trivial. Aorta: The aortic root is normal in size and structure. Venous: The inferior vena cava is normal in size with greater than 50% respiratory variability, suggesting right atrial pressure of 3 mmHg. IAS/Shunts: No atrial level shunt detected by color flow Doppler. Agitated saline contrast was given intravenously to evaluate for intracardiac shunting. Agitated saline contrast bubble study was negative, with no evidence of any interatrial shunt.  LEFT VENTRICLE PLAX 2D LVIDd:         4.10 cm  Diastology LVIDs:         3.00 cm  LV e' medial:    6.85 cm/s LV PW:         1.20 cm  LV E/e' medial:  15.6 LV IVS:        1.10 cm  LV e' lateral:  6.31 cm/s LVOT diam:     1.70 cm  LV E/e' lateral: 17.0 LV SV:         51 LV SV Index:   26 LVOT Area:     2.27 cm   RIGHT VENTRICLE             IVC RV S prime:     14.90 cm/s  IVC diam: 1.10 cm TAPSE (M-mode): 2.4 cm LEFT ATRIUM             Index       RIGHT ATRIUM           Index LA diam:        4.10 cm 2.06 cm/m  RA Area:     10.70 cm LA Vol (A2C):   55.7 ml 27.95 ml/m RA Volume:   21.40 ml  10.74 ml/m LA Vol (A4C):   61.5 ml 30.86 ml/m LA Biplane Vol: 59.0 ml 29.61 ml/m  AORTIC VALVE LVOT Vmax:   115.00 cm/s LVOT Vmean:  76.600 cm/s LVOT VTI:    0.225 m  AORTA Ao Root diam: 2.70 cm Ao Asc diam:  3.00 cm MITRAL VALVE MV Area (PHT): 3.53 cm     SHUNTS MV Decel Time: 215 msec     Systemic VTI:  0.22 m MV E velocity: 107.00 cm/s  Systemic Diam: 1.70 cm MV A velocity: 79.60 cm/s MV E/A ratio:  1.34 Nona Dell MD Electronically signed by Nona Dell MD Signature Date/Time: 11/06/2020/4:42:30 PM    Final     Scheduled Meds: . aspirin EC  81 mg Oral Daily  . atorvastatin  40 mg Oral Daily  . clopidogrel  75 mg Oral Daily  . enoxaparin (LOVENOX) injection  40 mg Subcutaneous Q24H  . FLUoxetine  10 mg Oral Daily  . gabapentin  100 mg Oral BID  . OLANZapine  5 mg Oral QHS  . pantoprazole  40 mg Oral Daily    Continuous Infusions:   LOS: 1 day     Darlin Drop, MD [facility] Pager [phone]  If 7PM-7AM, please contact night-coverage [email or web address] Password [email or web address] 11/07/2020, 2:13 PM

## 2020-11-07 NOTE — Progress Notes (Signed)
PHARMACIST - PHYSICIAN COMMUNICATION  DR:   Margo Aye  CONCERNING: IV to Oral Route Change Policy  RECOMMENDATION: This patient is receiving protonix by the intravenous route.  Based on criteria approved by the Pharmacy and Therapeutics Committee, the intravenous medication(s) is/are being converted to the equivalent oral dose form(s).   DESCRIPTION: These criteria include:  The patient is eating (either orally or via tube) and/or has been taking other orally administered medications for a least 24 hours  The patient has no evidence of active gastrointestinal bleeding or impaired GI absorption (gastrectomy, short bowel, patient on TNA or NPO).  If you have questions about this conversion, please contact the Pharmacy Department  []   [phone] )  [phone] []   [phone] )  [facility] [x]   [phone] )  [facility] []   [phone] )  [facility] []   [phone] )  [facility]   Susan Moore, [facility] 11/07/2020 10:04 AM   \

## 2020-11-08 NOTE — Progress Notes (Signed)
Physical Therapy Treatment Patient Details Name: Cathy Hall MRN: [id number] DOB: 01-06-1971 Today's Date: 11/08/2020    History of Present Illness 50 year old female with past medical history of COPD, hypertension, depression, obesity who presented to [facility] emergency department due to concerns left facial droop, dysarthria and left sided weakness with vertigo. 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 Small acute infarct right anterior thalamus. 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    PT Comments    Pt's functional status much improved today as well as cognition though continues to demonstrate decreased insight into deficits and general wellness. Pt independent with bed mobility. Supervision level for transfers. Ambulated 150' with RW with min A and 3 standing rest breaks due to fatigue and 2/4 DOE with HR up to 108 bpm. Pt reports that she can go to her boyfriend's home and he will be there at night and his father will be there in the daytime and can provide supervision. Pt would benefit from HHPT but may not be an option with insurance. PT will continue to follow.    Follow Up Recommendations  Home health PT;Supervision/Assistance - 24 hour (will need SNF if 24/7 assist can't be provided)     Equipment Recommendations  Rolling walker with 5" wheels    Recommendations for Other Services       Precautions / Restrictions Precautions Precautions: Fall Precaution Comments: mildly impulsive and can get emotional quickly Restrictions Weight Bearing Restrictions: No    Mobility  Bed Mobility Overal bed mobility: Modified Independent Bed Mobility: Supine to Sit     Supine to sit: Modified independent (Device/Increase time)     General bed mobility comments: pt able to come safely to EOB without assist  Transfers Overall transfer level: Needs assistance Equipment used: Rolling walker (2  wheeled) Transfers: Sit to/from Stand Sit to Stand: Supervision         General transfer comment: vc's for safety  Ambulation/Gait Ambulation/Gait assistance: Min assist Gait Distance (Feet): 150 Feet Assistive device: Rolling walker (2 wheeled) Gait Pattern/deviations: Step-through pattern;Decreased stride length Gait velocity: slow Gait velocity interpretation: 1.31 - 2.62 ft/sec, indicative of limited community ambulator General Gait Details: 3 standing rest breaks due to fatigue. Pt relayed mild dizziness but much improved from yesterday. HR up to 108 bpm with ambulation.   Stairs             Wheelchair Mobility    Modified Rankin (Stroke Patients Only) Modified Rankin (Stroke Patients Only) Pre-Morbid Rankin Score: Slight disability Modified Rankin: Moderately severe disability     Balance Overall balance assessment: Needs assistance Sitting-balance support: No upper extremity supported;Feet supported Sitting balance-Leahy Scale: Good     Standing balance support: Bilateral upper extremity supported;During functional activity Standing balance-Leahy Scale: Fair Standing balance comment: able to maintain static stance without support                            Cognition Arousal/Alertness: Awake/alert Behavior During Therapy: WFL for tasks assessed/performed Overall Cognitive Status: No family/caregiver present to determine baseline cognitive functioning                           Safety/Judgement: Decreased awareness of deficits;Decreased awareness of safety Awareness: Anticipatory   General Comments: greatly improved cognition this session, pt able to tell stories, answer questions appropriately, and follow directions without  cues. Continues to have some slurred speech as well as decreased insight into deficits and general health      Exercises      General Comments General comments (skin integrity, edema, etc.): pt reports that  she will go home to her boyfriend's home and he works 2 blocks away. In addition, his father lives there and can give supervision. Discussed general wellness and healthy eating after pt reported that the vegetable that she had yesterday was mac n cheese and potato wedges. Needs continued education on heart healthy eating.      Pertinent Vitals/Pain Pain Assessment: No/denies pain Pain Intervention(s): Monitored during session    Home Living                      Prior Function            PT Goals (current goals can now be found in the care plan section) Acute Rehab PT Goals Patient Stated Goal: to return to independent PT Goal Formulation: With patient Time For Goal Achievement: 11/20/20 Potential to Achieve Goals: Good Progress towards PT goals: Progressing toward goals    Frequency    Min 4X/week      PT Plan Current plan remains appropriate    Co-evaluation PT/OT/SLP Co-Evaluation/Treatment: Yes Reason for Co-Treatment: Necessary to address cognition/behavior during functional activity PT goals addressed during session: Balance;Mobility/safety with mobility;Proper use of DME        AM-PAC PT "6 Clicks" Mobility   Outcome Measure  Help needed turning from your back to your side while in a flat bed without using bedrails?: None Help needed moving from lying on your back to sitting on the side of a flat bed without using bedrails?: None Help needed moving to and from a bed to a chair (including a wheelchair)?: A Little Help needed standing up from a chair using your arms (e.g., wheelchair or bedside chair)?: A Little Help needed to walk in hospital room?: A Little Help needed climbing 3-5 steps with a railing? : A Little 6 Click Score: 20    End of Session Equipment Utilized During Treatment: Gait belt Activity Tolerance: Patient tolerated treatment well Patient left: in chair;with call bell/phone within reach;with chair alarm set Nurse Communication:  Mobility status PT Visit Diagnosis: Unsteadiness on feet (R26.81);Other abnormalities of gait and mobility (R26.89);Other symptoms and signs involving the nervous system (R29.898)     Time: 4196-2229 PT Time Calculation (min) (ACUTE ONLY): 25 min  Charges:  $Gait Training: 8-22 mins                     Leighton Roach, [place]  Pager [phone] Office [place] 11/08/2020, 4:46 PM

## 2020-11-08 NOTE — Progress Notes (Signed)
PROGRESS NOTE    Cathy Hall  ZOX:[id number] DOB: [birth year] DOA: 11/05/2020 PCP: Patient, No Pcp Per    Brief Narrative:  Cathy Hall is a 50 year old female with past medical history significant for COPD, essential hypertension, depression, obesity who presented to [facility] ED with left facial droop and left-sided weakness.  Last known well 2 AM on morning of 11/05/2020.  Associated with slurred speech and severe headache.  Given timing, patient was determined to be outside of TPA window.  The ED, BP 170/99, HR 84, temperature 98.3 F, RR 27, SPO2 99% on room air.  Sodium 138, potassium 4.1, chloride 102, CO2 24, glucose 122, BUN 14, creatinine 1.39, AST 20, ALT 12.  WBC 8.1, hemoglobin 13.9, platelets 159. EtOH <10.  He has positive for cocaine and THC.  Head without contrast negative.  Neurology was consulted.  TRH was consulted for further evaluation and management for suspected acute CVA and polysubstance abuse with cocaine/THC.   Assessment & Plan:   Principal Problem:   Arterial ischemic stroke [facility]) Active Problems:   Severe episode of recurrent major depressive disorder, without psychotic features (HCC)   Cocaine use disorder, moderate, dependence (HCC)   Cannabis use disorder, moderate, dependence (HCC)   Epigastric pain   Palpitations   Essential hypertension   Ischemic stroke ([place])   Acute ischemic CVA 2 left posterior midbrain, right anterior thalamus Patient presenting to the ED with left facial droop associated with left-sided weakness and slurred speech.  CT head without contrast negative.    UDS positive for cocaine/THC.  With acute infarct left posterior midbrain and the roof of the fourth ventricle, small acute infarct right anterior thalamus, chronic lacunar infarction right pons. Hemoglobin A1c 5.5.  Lipid panel with total cholesterol 196, HDL 51, LDL 113, triglycerides 162.  CTA head/neck/brain perfusion negative for acute infarct or  ischemia, no large vessel occlusion, no significant carotid or vertebral artery stenosis.  TTE with LVEF 50-55%, no cardiac source of embolism.  She with NSR.  Neurology was consulted and followed during hospital course. --DAPT with aspirin 81 mg p.o. daily, Plavix 75 mg daily x3 weeks followed by aspirin alone --Atorvastatin 40 mg p.o. daily, goal LDL less than 70 --PT: HH if 24h care vs SNF --OT: HH if 24h supervision vs SNF --SLP: Home health SLP; regular diet with thin liquids --Outpatient follow-up with neurology, stroke clinic 6 weeks  Essential hypertension BP 151/88 --amlodipine 5mg  PO daily --Hydralazine 10 mg IV q6h prn SBP > 220 DBP > 115 --Aspirin and statin --Continue monitor blood pressure closely and adjust as needed, avoid beta blockade in the setting of active cocaine abuse  Hyperlipidemia Lipid panel with total cholesterol 196, HDL 51, LDL 113, triglycerides 162.  --On atorvastatin 40 mg p.o. daily  Acute Renal failure Cr 1.39>1.30>1.11 --Avoid nephrotoxins, renally dose all medications --BMP daily  Depression: Prozac 10 mg p.o. daily, Zyprexa 5 mg PO qHS  Neuropathy: Gabapentin 100 mg twice daily  Cocaine/THC abuse disorder Counseled on need for complete cessation given acute CVA  Tobacco use disorder Counseled on need for complete cessation given acute stroke.  Morbid obesity Body mass index is 42.86 kg/m.  Discussed with patient needs for aggressive lifestyle changes/weight loss as this complicates all facets of care.   DVT prophylaxis: Lovenox Code Status: Full code Family Communication: Updated patient extensively at bedside  Disposition Plan:  Status is: Inpatient  Remains inpatient appropriate because:Unsafe d/c plan   Dispo:  Patient From:  Home  Planned Disposition: Home with Health Care Svc  Expected discharge date: 11/08/2020  Medically stable for discharge: No    Consultants:   Neurology: Signed off 11/07/2020  Procedures:    TEE  Antimicrobials:   None   Subjective: Patient seen and examined bedside, resting comfortably.  States lives with daughter but she is gone most of the day.  Likely unsafe for discharge home at this time with therapy recommendations for home health if 24-hour supervision.  No other questions or concerns at this time.  Denies current headache, no fever/chills/night sweats, no nausea/vomiting/diarrhea, no chest pain, no palpitations, no shortness of breath, no abdominal pain.  No acute events overnight per nursing staff.  Objective: Vitals:   11/08/20 0359 11/08/20 0757 11/08/20 1130 11/08/20 1532  BP: (!) 161/87 (!) 151/88 (!) 143/80 (!) 130/107  Pulse: 76 75 67 82  Resp: 19 18 18 18   Temp: 98.1 F (36.7 C) 98.1 F (36.7 C) 98.2 F (36.8 C) 98.5 F (36.9 C)  TempSrc: Oral   Axillary  SpO2: 97% 99% 98% 99%  Weight:      Height:       No intake or output data in the 24 hours ending 11/08/20 1538 Filed Weights   11/05/20 1741 11/06/20 0529  Weight: 97.1 kg 102.9 kg    Examination:  General exam: Appears calm and comfortable, appears older than stated age Respiratory system: Clear to auscultation. Respiratory effort normal.  Oxygenating well on room air Cardiovascular system: S1 & S2 heard, RRR. No JVD, murmurs, rubs, gallops or clicks. No pedal edema. Gastrointestinal system: Abdomen is nondistended, soft and nontender. No organomegaly or masses felt. Normal bowel sounds heard. Central nervous system: Alert and oriented. No focal neurological deficits. Extremities: Symmetric 5 x 5 power. Skin: No rashes, lesions or ulcers Psychiatry: Judgement and insight appear poor. Mood & affect appropriate.     Data Reviewed: I have personally reviewed following labs and imaging studies  CBC: Recent Labs  Lab 11/05/20 1806 11/05/20 1813  WBC 8.1  --   NEUTROABS 5.6  --   HGB 13.9 14.6  HCT 41.7 43.0  MCV 92.5  --   PLT 159  --    Basic Metabolic Panel: Recent Labs   Lab 11/05/20 1806 11/05/20 1813 11/06/20 0458  NA 138 140 142  K 4.1 4.1 3.8  CL 102 103 104  CO2 24  --  24  GLUCOSE 122* 119* 123*  BUN 14 18 14   CREATININE 1.39* 1.30* 1.11*  CALCIUM 9.3  --  9.0   GFR: Estimated Creatinine Clearance: 68.3 mL/min (A) (by C-G formula based on SCr of 1.11 mg/dL (H)). Liver Function Tests: Recent Labs  Lab 11/05/20 1806 11/06/20 0458  AST 20 15  ALT 12 11  ALKPHOS 61 55  BILITOT 0.7 0.6  PROT 6.5 6.0*  ALBUMIN 3.5 3.3*   Recent Labs  Lab 11/06/20 0614  LIPASE 27   No results for input(s): AMMONIA in the last 168 hours. Coagulation Profile: Recent Labs  Lab 11/05/20 1806  INR 0.9   Cardiac Enzymes: No results for input(s): CKTOTAL, CKMB, CKMBINDEX, TROPONINI in the last 168 hours. BNP (last 3 results) No results for input(s): PROBNP in the last 8760 hours. HbA1C: Recent Labs    11/06/20 0458  HGBA1C 5.5   CBG: Recent Labs  Lab 11/05/20 1805  GLUCAP 125*   Lipid Profile: Recent Labs    11/06/20 0458  CHOL 196  HDL 51  LDLCALC 113*  TRIG 162*  CHOLHDL 3.8   Thyroid Function Tests: No results for input(s): TSH, T4TOTAL, FREET4, T3FREE, THYROIDAB in the last 72 hours. Anemia Panel: No results for input(s): VITAMINB12, FOLATE, FERRITIN, TIBC, IRON, RETICCTPCT in the last 72 hours. Sepsis Labs: No results for input(s): PROCALCITON, LATICACIDVEN in the last 168 hours.  Recent Results (from the past 240 hour(s))  Resp Panel by RT-PCR (Flu A&B, Covid) Nasopharyngeal Swab     Status: None   Collection Time: 11/05/20 10:09 PM   Specimen: Nasopharyngeal Swab; Nasopharyngeal(NP) swabs in vial transport medium  Result Value Ref Range Status   SARS Coronavirus 2 by RT PCR NEGATIVE NEGATIVE Final    Comment: (NOTE) SARS-CoV-2 target nucleic acids are NOT DETECTED.  The SARS-CoV-2 RNA is generally detectable in upper respiratory specimens during the acute phase of infection. The lowest concentration of SARS-CoV-2  viral copies this assay can detect is 138 copies/mL. A negative result does not preclude SARS-Cov-2 infection and should not be used as the sole basis for treatment or other patient management decisions. A negative result may occur with  improper specimen collection/handling, submission of specimen other than nasopharyngeal swab, presence of viral mutation(s) within the areas targeted by this assay, and inadequate number of viral copies(<138 copies/mL). A negative result must be combined with clinical observations, patient history, and epidemiological information. The expected result is Negative.  Fact Sheet for Patients:  BloggerCourse.com  Fact Sheet for Healthcare Providers:  SeriousBroker.it  This test is no t yet approved or cleared by the [place] FDA and  has been authorized for detection and/or diagnosis of SARS-CoV-2 by FDA under an Emergency Use Authorization (EUA). This EUA will remain  in effect (meaning this test can be used) for the duration of the COVID-19 declaration under Section 564(b)(1) of the Act, 21 U.S.C.section 360bbb-3(b)(1), unless the authorization is terminated  or revoked sooner.       Influenza A by PCR NEGATIVE NEGATIVE Final   Influenza B by PCR NEGATIVE NEGATIVE Final    Comment: (NOTE) The Xpert Xpress SARS-CoV-2/FLU/RSV plus assay is intended as an aid in the diagnosis of influenza from Nasopharyngeal swab specimens and should not be used as a sole basis for treatment. Nasal washings and aspirates are unacceptable for Xpert Xpress SARS-CoV-2/FLU/RSV testing.  Fact Sheet for Patients: BloggerCourse.com  Fact Sheet for Healthcare Providers: SeriousBroker.it  This test is not yet approved or cleared by the [place] FDA and has been authorized for detection and/or diagnosis of SARS-CoV-2 by FDA under an Emergency Use Authorization  (EUA). This EUA will remain in effect (meaning this test can be used) for the duration of the COVID-19 declaration under Section 564(b)(1) of the Act, 21 U.S.C. section 360bbb-3(b)(1), unless the authorization is terminated or revoked.  Performed at [facility], [address] [place], [place] [address]          Radiology Studies: ECHOCARDIOGRAM COMPLETE BUBBLE STUDY  Result Date: 11/06/2020    ECHOCARDIOGRAM REPORT   Patient Name:   DAIJA ROUTSON [facility] Date of Exam: 11/06/2020 Medical Rec #:  [id number]                   Height:       61.0 in Accession #:    [id number]                  Weight:       226.9 lb Date of Birth:  1971/10/22  BSA:          1.993 m Patient Age:    49 years                    BP:           118/75 mmHg Patient Gender: F                           HR:           74 bpm. Exam Location:  Inpatient Procedure: 2D Echo and Saline Contrast Bubble Study Indications:    stroke 434.91  History:        Patient has no prior history of Echocardiogram examinations.                 Risk Factors:Hypertension and cocaine use.  Sonographer:    Delcie Roch Referring Phys: [id number] Deno Lunger SHAL[place]B IMPRESSIONS  1. Left ventricular ejection fraction, by estimation, is 55 to 60%. The left ventricle has normal function. The left ventricle has no regional wall motion abnormalities. There is mild left ventricular hypertrophy. Left ventricular diastolic parameters were normal.  2. Right ventricular systolic function is normal. The right ventricular size is normal. Tricuspid regurgitation signal is inadequate for assessing PA pressure.  3. The mitral valve is grossly normal. Trivial mitral valve regurgitation.  4. The aortic valve is tricuspid. Aortic valve regurgitation is not visualized.  5. The inferior vena cava is normal in size with greater than 50% respiratory variability, suggesting right atrial pressure of 3 mmHg.  6. Agitated saline contrast bubble study was  negative, with no evidence of any interatrial shunt. FINDINGS  Left Ventricle: Left ventricular ejection fraction, by estimation, is 55 to 60%. The left ventricle has normal function. The left ventricle has no regional wall motion abnormalities. The left ventricular internal cavity size was normal in size. There is  mild left ventricular hypertrophy. Left ventricular diastolic parameters were normal. Right Ventricle: The right ventricular size is normal. No increase in right ventricular wall thickness. Right ventricular systolic function is normal. Tricuspid regurgitation signal is inadequate for assessing PA pressure. Left Atrium: Left atrial size was normal in size. Right Atrium: Right atrial size was normal in size. Pericardium: There is no evidence of pericardial effusion. Mitral Valve: The mitral valve is grossly normal. Mild mitral annular calcification. Trivial mitral valve regurgitation. Tricuspid Valve: The tricuspid valve is grossly normal. Tricuspid valve regurgitation is trivial. Aortic Valve: The aortic valve is tricuspid. There is mild aortic valve annular calcification. Aortic valve regurgitation is not visualized. Pulmonic Valve: The pulmonic valve was grossly normal. Pulmonic valve regurgitation is trivial. Aorta: The aortic root is normal in size and structure. Venous: The inferior vena cava is normal in size with greater than 50% respiratory variability, suggesting right atrial pressure of 3 mmHg. IAS/Shunts: No atrial level shunt detected by color flow Doppler. Agitated saline contrast was given intravenously to evaluate for intracardiac shunting. Agitated saline contrast bubble study was negative, with no evidence of any interatrial shunt.  LEFT VENTRICLE PLAX 2D LVIDd:         4.10 cm  Diastology LVIDs:         3.00 cm  LV e' medial:    6.85 cm/s LV PW:         1.20 cm  LV E/e' medial:  15.6 LV IVS:        1.10 cm  LV e' lateral:  6.31 cm/s LVOT diam:     1.70 cm  LV E/e' lateral: 17.0 LV SV:          51 LV SV Index:   26 LVOT Area:     2.27 cm  RIGHT VENTRICLE             IVC RV S prime:     14.90 cm/s  IVC diam: 1.10 cm TAPSE (M-mode): 2.4 cm LEFT ATRIUM             Index       RIGHT ATRIUM           Index LA diam:        4.10 cm 2.06 cm/m  RA Area:     10.70 cm LA Vol (A2C):   55.7 ml 27.95 ml/m RA Volume:   21.40 ml  10.74 ml/m LA Vol (A4C):   61.5 ml 30.86 ml/m LA Biplane Vol: 59.0 ml 29.61 ml/m  AORTIC VALVE LVOT Vmax:   115.00 cm/s LVOT Vmean:  76.600 cm/s LVOT VTI:    0.225 m  AORTA Ao Root diam: 2.70 cm Ao Asc diam:  3.00 cm MITRAL VALVE MV Area (PHT): 3.53 cm     SHUNTS MV Decel Time: 215 msec     Systemic VTI:  0.22 m MV E velocity: 107.00 cm/s  Systemic Diam: 1.70 cm MV A velocity: 79.60 cm/s MV E/A ratio:  1.34 Nona Dell MD Electronically signed by Nona Dell MD Signature Date/Time: 11/06/2020/4:42:30 PM    Final         Scheduled Meds: . amLODipine  5 mg Oral Daily  . aspirin EC  81 mg Oral Daily  . atorvastatin  40 mg Oral Daily  . clopidogrel  75 mg Oral Daily  . enoxaparin (LOVENOX) injection  40 mg Subcutaneous Q24H  . FLUoxetine  10 mg Oral Daily  . gabapentin  100 mg Oral BID  . OLANZapine  5 mg Oral QHS  . pantoprazole  40 mg Oral Daily   Continuous Infusions:   LOS: 2 days    Time spent: 38 minutes spent on chart review, discussion with nursing staff, consultants, updating family and interview/physical exam; more than 50% of that time was spent in counseling and/or coordination of care.    Alvira Philips [place], DO [facility] Available via Epic secure chat 7am-7pm After these hours, please refer to coverage provider listed on [email or web address] 11/08/2020, 3:38 PM

## 2020-11-08 NOTE — Evaluation (Signed)
Speech Language Pathology Evaluation Patient Details Name: Cathy Hall MRN: [id number] DOB: [birth year] Today's Date: 11/08/2020 Time: 3329-5188 SLP Time Calculation (min) (ACUTE ONLY): 20 min  Problem List:  Patient Active Problem List   Diagnosis Date Noted  . Ischemic stroke (HCC) 11/06/2020  . Arterial ischemic stroke (HCC) 11/05/2020  . Epigastric pain 11/05/2020  . Palpitations 11/05/2020  . Essential hypertension 11/05/2020  . Cocaine use disorder, moderate, dependence (HCC) 08/09/2020  . Cannabis use disorder, moderate, dependence (HCC) 08/09/2020  . Severe episode of recurrent major depressive disorder, without psychotic features [facility])    Past Medical History:  Past Medical History:  Diagnosis Date  . Anxiety   . Asthma   . COPD (chronic obstructive pulmonary disease) (HCC)   . Depression   . Hypertension   . Major depression   . PTSD (post-traumatic stress disorder)    Past Surgical History:  Past Surgical History:  Procedure Laterality Date  . ECTOPIC PREGNANCY SURGERY     HPI:  50 year old female with past medical history of COPD, hypertension, depression, obesity who presented to [facility] emergency department due to concerns left facial droop, dysarthria and left sided weakness with vertigo. 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 Small acute infarct right anterior thalamus. 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   Assessment / Plan / Recommendation Clinical Impression  Pt presents with changes in cognition and mild dysarthria of speech s/p CVA. She notes both of these areas have improved since admission but are not completely back to baseline. PLOF pt was independent with all ADLs and lived with daughter in law and granddaughter. 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 She states she has boyfriend whom also is involved in her life. Current cognitive deficits include  decreased executive function, reduced safety awareness, and decreased recall. She reports acute vision changes. Dysarthria of speech c/b reduced articulation of sounds however this did not impact communication intelligiblity. Recommend continued ST services for deficits noted and [facility] SLP follow up.    SLP Assessment  SLP Recommendation/Assessment: Patient needs continued Speech Lanaguage Pathology Services SLP Visit Diagnosis: Cognitive communication deficit (R41.841);Dysarthria and anarthria (R47.1)    Follow Up Recommendations  Home health SLP    Frequency and Duration min 1 x/week  1 week      SLP Evaluation Cognition  Overall Cognitive Status: Impaired/Different from baseline Arousal/Alertness: Awake/alert Orientation Level: Oriented to person;Oriented to place;Oriented to situation Attention: Focused;Sustained Focused Attention: Impaired Focused Attention Impairment: Verbal complex;Functional complex Sustained Attention: Impaired Sustained Attention Impairment: Verbal complex;Functional complex Memory: Impaired Awareness: Impaired Problem Solving: Impaired Executive Function: Organizing;Self Monitoring;Sequencing Sequencing: Impaired Organizing: Impaired Organizing Impairment: Verbal complex Self Monitoring: Impaired Self Monitoring Impairment: Verbal complex;Functional complex Safety/Judgment: Impaired       Comprehension  Auditory Comprehension Overall Auditory Comprehension: Appears within functional limits for tasks assessed Visual Recognition/Discrimination Discrimination: Exceptions to [facility]    Expression Expression Primary Mode of Expression: Verbal Verbal Expression Overall Verbal Expression: Appears within functional limits for tasks assessed Written Expression Dominant Hand: Right   Oral / Motor  Oral Motor/Sensory Function Overall Oral Motor/Sensory Function: Mild impairment Facial ROM: Reduced left;Suspected CN VII (facial) dysfunction Facial Symmetry:  Abnormal symmetry left Facial Strength: Reduced left Facial Sensation: Reduced left Lingual ROM: Within Functional Limits Lingual Symmetry: Within Functional Limits Lingual Strength: Within Functional Limits Lingual Sensation: Within Functional Limits Velum: Within Functional Limits Mandible: Within Functional Limits  Motor Speech Overall Motor Speech: Impaired Respiration: Impaired Articulation: Impaired Level of Impairment: Sentence Intelligibility: Intelligible Motor Planning: Witnin functional limits   GO                    Rafaelita Foister E Ruey Storer MA, CCC-SLP Acute Rehabilitation Services  11/08/2020, 10:24 AM

## 2020-11-08 NOTE — TOC CAGE-AID Note (Signed)
Transition of Care [facility]) - CAGE-AID Screening   Patient Details  Name: Cathy Hall MRN: [id number] Date of Birth: [birth year]  [facility]) CM/SW Contact:    Kermit Balo, RN Phone Number: 11/08/2020, 4:36 PM   Clinical Narrative: Pt admits to using cocaine d/t abuse as a child and to help her cope with stressors. CM provided her inpatient/ outpatient resources for drug counseling.   CAGE-AID Screening:    Have You Ever Felt You Ought to Cut Down on Your Drinking or Drug Use?: Yes Have People Annoyed You By Critizing Your Drinking Or Drug Use?: Yes Have You Felt Bad Or Guilty About Your Drinking Or Drug Use?: Yes Have You Ever Had a Drink or Used Drugs First Thing In The Morning to Steady Your Nerves or to Get Rid of a Hangover?: No CAGE-AID Score: 3

## 2020-11-08 NOTE — Evaluation (Signed)
Clinical/Bedside Swallow Evaluation Patient Details  Name: Cathy Hall MRN: [id number] Date of Birth: 03-28-1971  Today's Date: 11/08/2020 Time: SLP Start Time (ACUTE ONLY): 0915 SLP Stop Time (ACUTE ONLY): 0930 SLP Time Calculation (min) (ACUTE ONLY): 15 min  Past Medical History:  Past Medical History:  Diagnosis Date  . Anxiety   . Asthma   . COPD (chronic obstructive pulmonary disease) (HCC)   . Depression   . Hypertension   . Major depression   . PTSD (post-traumatic stress disorder)    Past Surgical History:  Past Surgical History:  Procedure Laterality Date  . ECTOPIC PREGNANCY SURGERY     HPI:  50 year old female with past medical history of COPD, hypertension, depression, obesity who presented to [facility] emergency department due to concerns left facial droop, dysarthria and left sided weakness with vertigo. 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 Small acute infarct right anterior thalamus. 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 Pts current complaint reported to include globus sensation to solid PO. Assessed with 3 oz water challenge. No overt s/sx of aspiration. Prior to POs pt did appear to have subtle wetness in vocal quality (suspect possible secretion impacting voicing in setting of COPD). Pt states some mild globus sensation with solid PO trial with SLP however this was resolved with use of double swallow and thin liquid alternation. Pt educated on safe swallowing strategies to maxmize swallow safety and efficiency. Will continue to monitor.  SLP Visit Diagnosis: Dysphagia, unspecified (R13.10)    Aspiration Risk  Mild aspiration risk    Diet Recommendation  Regular thin liquids   Medication Administration: Whole  meds with liquid    Other  Recommendations Oral Care Recommendations: Oral care BID   Follow up Recommendations Home health SLP      Frequency and Duration min 1 x/week  1 week       Prognosis Prognosis for Safe Diet Advancement: Good      Swallow Study   General Date of Onset: 11/05/20 HPI: 50 year old female with past medical history of COPD, hypertension, depression, obesity who presented to [facility] emergency department due to concerns left facial droop, dysarthria and left sided weakness with vertigo. 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 Small acute infarct right anterior thalamus. 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 Type of Study: Bedside Swallow Evaluation Previous Swallow Assessment: none on file Diet Prior to this Study: Regular;Thin liquids Temperature Spikes Noted: No Respiratory Status: Room air History of Recent Intubation: No Behavior/Cognition: Alert;Cooperative;Pleasant mood Oral Cavity Assessment: Within Functional Limits Oral Care Completed by SLP: No Oral Cavity - Dentition: Missing dentition Vision: Functional for self-feeding Self-Feeding Abilities: Able to feed self Patient Positioning: Upright in bed Baseline Vocal Quality: Low vocal intensity Volitional Cough: Congested Volitional Swallow: Able to elicit    Oral/Motor/Sensory Function Overall Oral Motor/Sensory Function: Mild impairment Facial ROM: Reduced left;Suspected CN VII (facial) dysfunction Facial Strength: Reduced left Facial Sensation: Reduced left Lingual ROM: Within Functional Limits Lingual Symmetry: Within Functional Limits Lingual Strength: Within Functional Limits Velum: Within Functional Limits Mandible: Within Functional Limits   Ice Chips Ice chips: Within functional limits Presentation: Spoon   Thin Liquid Thin Liquid: Within functional limits Presentation: Cup  Nectar Thick Nectar Thick Liquid:  Not tested   Honey Thick Honey Thick Liquid: Not tested   Puree Puree: Within functional limits   Solid     Solid: Impaired Presentation: Self Fed Oral Phase Functional Implications: Prolonged oral transit Pharyngeal Phase Impairments: Suspected delayed Swallow;Multiple swallows;Other (comments) (reported some mild globus sensation)      Vernel Donlan E Esabella Stockinger MA, CCC-SLP Acute Rehabilitation Services  11/08/2020,10:08 AM

## 2020-11-08 NOTE — TOC Progression Note (Addendum)
Transition of Care [facility]) - Progression Note    Patient Details  Name: Cathy Hall MRN: [id number] Date of Birth: November 16, 1970  Transition of Care [facility]) CM/SW Contact  Pollie Friar, RN Phone Number: 11/08/2020, 1:26 PM  Clinical Narrative:    CM met with the patient and she does not have 24 hour supervision at home. Per therapies they feel she does need this supervision at least initially upon returning home. Pt is agreeable to SNF rehab but doesn't have any insurance. CM will f/u with financial counseling.  TOC following.  1430: PT/OT feel she is doing much better and could d/c home. CM has updated the MD and requested charity [facility] services through [facility]. Charity DME ordered through [place]. TOC following.     Barriers to Discharge: Continued Medical Work up  Expected Discharge Plan and Services                                                 Social Determinants of Health (SDOH) Interventions    Readmission Risk Interventions No flowsheet data found.

## 2020-11-08 NOTE — Progress Notes (Signed)
Occupational Therapy Treatment Patient Details Name: Cathy Hall MRN: [id number] DOB: 02/07/1971 Today's Date: 11/08/2020    History of present illness 50 year old female with past medical history of COPD, hypertension, depression, obesity who presented to [facility] emergency department due to concerns left facial droop, dysarthria and left sided weakness with vertigo. 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 Small acute infarct right anterior thalamus. 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   OT comments  Pt much improved since previous session Sunday. Pt overall supervision (due to hospital setting) for toilet transfer, peri care (including underwear management), sink level grooming, no visual deficits today (no extra eye movements or running into objects during room mobility). DC recommendation updated as Pt will not get HH due to lack of insurance - she plans to dc with her boyfriend who will be able to provide intermittent supervision and with assist of family 24/7 supervision. OT will continue to follow acutely if patient remains in hospital with next session to focus on LUE (hand) function but ok to dc home from OT perspective.    Follow Up Recommendations  Supervision - Intermittent;No OT follow up    Equipment Recommendations  3 in 1 bedside commode    Recommendations for Other Services      Precautions / Restrictions Precautions Precautions: Fall Restrictions Weight Bearing Restrictions: No       Mobility Bed Mobility Overal bed mobility: Modified Independent             General bed mobility comments: increased time, use of bed rail  Transfers Overall transfer level: Needs assistance Equipment used: Rolling walker (2 wheeled) Transfers: Sit to/from Stand Sit to Stand: Supervision              Balance Overall balance assessment: Needs assistance Sitting-balance support: No  upper extremity supported;Feet supported Sitting balance-Leahy Scale: Good     Standing balance support: Bilateral upper extremity supported;During functional activity Standing balance-Leahy Scale: Fair Standing balance comment: able to stand at sink for hand washing                           ADL either performed or assessed with clinical judgement   ADL Overall ADL's : Needs assistance/impaired     Grooming: Modified independent;Wash/dry hands;Standing Grooming Details (indicate cue type and reason): sink level             Lower Body Dressing: Minimal assistance;Sitting/lateral leans Lower Body Dressing Details (indicate cue type and reason): to don slippers Toilet Transfer: Supervision/safety;Ambulation;RW Toilet Transfer Details (indicate cue type and reason): no physical assist needed Toileting- Clothing Manipulation and Hygiene: Modified independent;Sit to/from stand Toileting - Clothing Manipulation Details (indicate cue type and reason): front and back peri care     Functional mobility during ADLs: Supervision/safety;Rolling walker       Vision   Vision Assessment?: No apparent visual deficits   Perception     Praxis      Cognition Arousal/Alertness: Awake/alert Behavior During Therapy: WFL for tasks assessed/performed Overall Cognitive Status: Within Functional Limits for tasks assessed                                 General Comments: greatly improved cognition this session, pt able to tell stories, answer questions appropriately, and follow directions without cues  Exercises     Shoulder Instructions       General Comments VSS even with hallway mobility    Pertinent Vitals/ Pain       Pain Assessment: No/denies pain Pain Intervention(s): Monitored during session  Home Living                                          Prior Functioning/Environment              Frequency  Min 2X/week         Progress Toward Goals  OT Goals(current goals can now be found in the care plan section)  Progress towards OT goals: Progressing toward goals  Acute Rehab OT Goals Patient Stated Goal: to return to independent OT Goal Formulation: With patient Time For Goal Achievement: 11/20/20 Potential to Achieve Goals: Good  Plan Discharge plan needs to be updated    Co-evaluation                 AM-PAC OT "6 Clicks" Daily Activity     Outcome Measure   Help from another person eating meals?: None Help from another person taking care of personal grooming?: A Little Help from another person toileting, which includes using toliet, bedpan, or urinal?: A Little Help from another person bathing (including washing, rinsing, drying)?: A Little Help from another person to put on and taking off regular upper body clothing?: None Help from another person to put on and taking off regular lower body clothing?: A Little 6 Click Score: 20    End of Session Equipment Utilized During Treatment: Rolling walker  OT Visit Diagnosis: Unsteadiness on feet (R26.81);Other abnormalities of gait and mobility (R26.89);Muscle weakness (generalized) (M62.81);Low vision, both eyes (H54.2);Other symptoms and signs involving the nervous system (R29.898);Hemiplegia and hemiparesis Hemiplegia - Right/Left: Left Hemiplegia - dominant/non-dominant: Non-Dominant Hemiplegia - caused by: Cerebral infarction   Activity Tolerance Patient tolerated treatment well   Patient Left in chair;with call bell/phone within reach;with nursing/sitter in room (NT in room)   Nurse Communication Mobility status        Time: 2130-8657 OT Time Calculation (min): 25 min  Charges: OT General Charges $OT Visit: 1 Visit OT Treatments $Self Care/Home Management : 8-22 mins  Nyoka Cowden OTR/L Acute Rehabilitation Services Pager: [phone] Office: [phone]   Evern Bio Zarya Lasseigne 11/08/2020, 3:46 PM

## 2020-11-09 ENCOUNTER — Other Ambulatory Visit (HOSPITAL_COMMUNITY): Payer: Self-pay | Admitting: Internal Medicine

## 2020-11-09 LAB — BASIC METABOLIC PANEL
Anion gap: 11 (ref 5–15)
BUN: 13 mg/dL (ref 6–20)
CO2: 25 mmol/L (ref 22–32)
Calcium: 8.6 mg/dL — ABNORMAL LOW (ref 8.9–10.3)
Chloride: 102 mmol/L (ref 98–111)
Creatinine, Ser: 0.87 mg/dL (ref 0.44–1.00)
GFR, Estimated: >60 mL/min (ref >60)
Glucose, Bld: 114 mg/dL — ABNORMAL HIGH (ref 70–99)
Potassium: 4.1 mmol/L (ref 3.5–5.1)
Sodium: 138 mmol/L (ref 135–145)

## 2020-11-09 MED ORDER — CLOPIDOGREL BISULFATE 75 MG PO TABS: 75.0000 mg | ORAL_TABLET | Freq: Every day | ORAL | 0 refills | Status: DC

## 2020-11-09 MED ORDER — ATORVASTATIN CALCIUM 40 MG PO TABS: 40.0000 mg | ORAL_TABLET | Freq: Every day | ORAL | 0 refills | Status: DC

## 2020-11-09 MED ORDER — ASPIRIN 81 MG PO TBEC: 81.0000 mg | DELAYED_RELEASE_TABLET | Freq: Every day | ORAL | 0 refills | Status: DC

## 2020-11-09 MED ORDER — PANTOPRAZOLE SODIUM 40 MG PO TBEC: 40.0000 mg | DELAYED_RELEASE_TABLET | Freq: Every day | ORAL | 0 refills | Status: DC

## 2020-11-09 MED ORDER — AMLODIPINE BESYLATE 5 MG PO TABS: 5.0000 mg | ORAL_TABLET | Freq: Every day | ORAL | 0 refills | Status: DC

## 2020-11-09 MED FILL — AMLODIPINE BESYLATE 5 MG TA: 5 | 30 days supply | Qty: 30 | Fill #0

## 2020-11-09 MED FILL — PANTOPRAZOLE SOD DR 40 MG T: 40 | 30 days supply | Qty: 30 | Fill #0

## 2020-11-09 MED FILL — CLOPIDOGREL 75 MG TABLET: 75 | 21 days supply | Qty: 21 | Fill #0

## 2020-11-09 MED FILL — ATORVASTATIN CALCIUM 40 MG: 40 | 30 days supply | Qty: 30 | Fill #0

## 2020-11-09 MED FILL — ASPIRIN LOW DOSE 81 MG TBEC: 81 | 90 days supply | Qty: 90 | Fill #0

## 2020-11-09 NOTE — TOC Transition Note (Signed)
Transition of Care [facility]) - CM/SW Discharge Note   Patient Details  Name: Cathy Hall MRN: [id number] Date of Birth: 1971/03/06  [facility]) CM/SW Contact:  Kermit Balo, RN Phone Number: 11/09/2020, 11:43 AM   Clinical Narrative:    Pt is discharging home with her boyfriend who says can provide needed supervision. 3 in 1 and walker at the bedside for home. CM unable to arrange charity [facility] services. MD and pt aware.  Medications for home provided with [facility] and delivered per [facility] pharmacy. Pt has transportation home.   Final next level of care: Home/Self Care Barriers to Discharge: Inadequate or no insurance,Barriers Unresolved (comment)   Patient Goals and CMS Choice     Choice offered to / list presented to : Patient  Discharge Placement                       Discharge Plan and Services                DME Arranged: 3-N-1,Walker rolling DME Agency: [employer] Date DME Agency Contacted: 11/08/20   Representative spoke with at DME Agency: Velna Hatchet ---[facility] services            Social Determinants of Health (SDOH) Interventions     Readmission Risk Interventions No flowsheet data found.

## 2020-11-09 NOTE — Discharge Summary (Signed)
Physician Discharge Summary  Cathy Hall ZOX:[id number] DOB: [birth year] DOA: 11/05/2020  PCP: Patient, No Pcp Per  Admit date: 11/05/2020 Discharge date: 11/09/2020  Admitted From: Home Disposition: Home  Recommendations for Outpatient Follow-up:  1. Follow up with PCP in 1-2 weeks 2. Follow-up with neurology/stroke clinic 6 weeks 3. Continue DAPT with aspirin and Plavix for 3 weeks followed by aspirin alone 4. Needs further encouragement/discussion regarding polysubstance abuse 5. Started on amlodipine 5 mg p.o. daily 6. Monitor blood pressure closely, may need further adjustments 7. Will need second dose of Covid-19 vaccination  Home Health: Home health agencies declined due to history of subs abuse Equipment/Devices: 3 1 bedside commode, walker  Discharge Condition: Stable CODE STATUS: Full code Diet recommendation: Heart healthy diet  History of present illness:  Cathy Hall Cathy Hall is a 50 year old female with past medical history significant for COPD, essential hypertension, depression, obesity who presented to [facility], ED with left facial droop and left-sided weakness.  Last known well 2 AM on morning of 11/05/2020.  Associated with slurred speech and severe headache.  Given timing, patient was determined to be outside of TPA window.  The ED, BP 170/99, HR 84, temperature 98.3 F, RR 27, SPO2 99% on room air.  Sodium 138, potassium 4.1, chloride 102, CO2 24, glucose 122, BUN 14, creatinine 1.39, AST 20, ALT 12.  WBC 8.1, hemoglobin 13.9, platelets 159. EtOH <10.  He has positive for cocaine and THC.  Head without contrast negative.  Neurology was consulted.  TRH was consulted for further evaluation and management for suspected acute CVA and polysubstance abuse with cocaine/THC.  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  CT head without contrast negative.    UDS positive for cocaine/THC.  With acute infarct left posterior midbrain and the roof of the fourth ventricle, small acute infarct right anterior thalamus, chronic lacunar infarction right pons. Hemoglobin A1c 5.5.  Lipid panel with total cholesterol 196, HDL 51, LDL 113, triglycerides 162.  CTA head/neck/brain perfusion negative for acute infarct or ischemia, no large vessel occlusion, no significant carotid or vertebral artery stenosis.  TTE with LVEF 50-55%, no cardiac source of embolism.  She with NSR.  Neurology was consulted and followed during hospital course. DAPT with aspirin 81 mg p.o. daily, Plavix 75 mg daily x3 weeks followed by aspirin alone. Atorvastatin 40 mg p.o. daily, goal LDL less than 70.  PT/OT/SLP recommended home health services, although no company would cover given her cocaine abuse. Outpatient follow-up with neurology, stroke clinic 6 weeks  Essential hypertension Started on amlodipine 5mg  PO daily. Aspirin and statin.  Outpatient follow-up with PCP for further adjustment/monitoring.  Likely to avoid beta-blockers given history of cocaine abuse.  Hyperlipidemia Lipid panel with total cholesterol 196, HDL 51, LDL 113, triglycerides 162. On atorvastatin 40 mg p.o. daily  Acute Renal failure: Resolved Creatinine 1.39 on presentation, improved with IV fluid hydration.  Likely secondary to dehydration versus ATN from a cocaine abuse.  Creatinine at time of discharge 0.87.  Depression: Prozac 10 mg p.o. daily, Zyprexa 5 mg PO qHS  Neuropathy: Gabapentin 100 mg twice daily  Cocaine/THC abuse disorder Counseled on need for complete cessation given acute CVA  Tobacco use disorder Counseled on need for complete cessation given acute stroke.  Morbid obesity Body mass index is 42.86 kg/m.  Discussed with patient needs for  aggressive lifestyle changes/weight loss as this complicates all facets of care.  Discharge  Diagnoses:  Active Problems:   Severe episode of recurrent major depressive disorder, without psychotic features (HCC)   Cocaine use disorder, moderate, dependence (HCC)   Cannabis use disorder, moderate, dependence (HCC)   Epigastric pain   Palpitations   Essential hypertension   Ischemic stroke [facility])    Discharge Instructions  Discharge Instructions    Call MD for:  difficulty breathing, headache or visual disturbances   Complete by: As directed    Call MD for:  extreme fatigue   Complete by: As directed    Call MD for:  persistant dizziness or light-headedness   Complete by: As directed    Call MD for:  persistant nausea and vomiting   Complete by: As directed    Call MD for:  severe uncontrolled pain   Complete by: As directed    Call MD for:  temperature >100.4   Complete by: As directed    Diet - low sodium heart healthy   Complete by: As directed    Increase activity slowly   Complete by: As directed      Allergies as of 11/09/2020      Reactions   Nsaids Other (See Comments)   Other reaction(s): Kidney Disorder      Medication List    STOP taking these medications   triamterene-hydrochlorothiazide 37.5-25 MG tablet Commonly known as: MAXZIDE-25     TAKE these medications   albuterol 108 (90 Base) MCG/ACT inhaler Commonly known as: VENTOLIN HFA Inhale 2 puffs into the lungs every 6 (six) hours as needed for wheezing or shortness of breath.   amLODipine 5 MG tablet Commonly known as: NORVASC Take 1 tablet (5 mg total) by mouth daily.   Artificial Tears 1.4 % ophthalmic solution Generic drug: polyvinyl alcohol Place 1 drop into both eyes daily as needed for dry eyes.   aspirin 81 MG EC tablet Take 1 tablet (81 mg total) by mouth daily. Swallow whole.   atorvastatin 40 MG tablet Commonly known as: LIPITOR Take 1 tablet (40 mg total) by mouth daily.   clopidogrel 75 MG tablet Commonly known as: PLAVIX Take 1 tablet (75 mg total) by mouth daily for  21 days.   diclofenac Sodium 1 % Gel Commonly known as: VOLTAREN Apply 1 application topically 2 (two) times daily as needed (pain).   FLUoxetine 10 MG capsule Commonly known as: PROZAC Take 1 capsule (10 mg total) by mouth daily.   gabapentin 100 MG capsule Commonly known as: NEURONTIN Take 1 capsule (100 mg total) by mouth 2 (two) times daily.   hydrOXYzine 25 MG tablet Commonly known as: ATARAX/VISTARIL Take 1 tablet (25 mg total) by mouth 3 (three) times daily as needed for anxiety.   multivitamin with minerals Tabs tablet Take 1 tablet by mouth daily.   OLANZapine 5 MG tablet Commonly known as: ZyPREXA Take 1 tablet (5 mg total) by mouth at bedtime.   pantoprazole 40 MG tablet Commonly known as: PROTONIX Take 1 tablet (40 mg total) by mouth daily.            Durable Medical Equipment  (From admission, onward)         Start     Ordered   11/07/20 0556  For home use only DME 3 n 1  Once        11/07/20 0555   11/07/20 0556  For home use only DME Walker rolling  Once  Question Answer Comment  Walker: With 5 Inch Wheels   Patient needs a walker to treat with the following condition Ambulatory dysfunction      11/07/20 0556          Follow-up Information    [facility] Follow up on 12/05/2020.   Why: Your appt is at 2:30. Please arrive early and bring a picture ID and your current medications. Contact information: [address] [place] [address] [phone]       [facility]. Schedule an appointment as soon as possible for a visit in 6 week(s).   Contact information: [address]     [address] [place] [address] [phone]             Allergies  Allergen Reactions  . Nsaids Other (See Comments)    Other reaction(s): Kidney Disorder    Consultations:  Neurology   Procedures/Studies: CT Angio Head W or Wo Contrast  Result Date:  11/05/2020 CLINICAL DATA:  Acute neuro deficit. Left facial droop, headache and slurred speech EXAM: CT ANGIOGRAPHY HEAD AND NECK CT PERFUSION BRAIN TECHNIQUE: Multidetector CT imaging of the head and neck was performed using the standard protocol during bolus administration of intravenous contrast. Multiplanar CT image reconstructions and MIPs were obtained to evaluate the vascular anatomy. Carotid stenosis measurements (when applicable) are obtained utilizing NASCET criteria, using the distal internal carotid diameter as the denominator. Multiphase CT imaging of the brain was performed following IV bolus contrast injection. Subsequent parametric perfusion maps were calculated using RAPID software. CONTRAST:  OMNIPAQUE IOHEXOL 350 MG/ML SOLN COMPARISON:  CT head 11/05/2020 FINDINGS: CTA NECK FINDINGS Aortic arch: Mild atherosclerotic calcification aortic arch. Proximal great vessels widely patent. Right carotid system: Mild atherosclerotic disease right carotid bifurcation without significant stenosis Left carotid system: Mild atherosclerotic disease left carotid bifurcation without stenosis. Vertebral arteries: Both vertebral arteries widely patent without stenosis. Skeleton: No acute skeletal abnormality. Other neck: Thyromegaly without focal lesion. Upper chest: Lung apices clear bilaterally. Review of the MIP images confirms the above findings CTA HEAD FINDINGS Anterior circulation: Mild atherosclerotic calcification in the cavernous carotid bilaterally without stenosis. Anterior and middle cerebral arteries normal bilaterally without stenosis or large vessel occlusion. Posterior circulation: Both vertebral arteries patent to the basilar. Basilar widely patent. Superior cerebellar and posterior cerebral arteries patent bilaterally without stenosis. Small left PICA patent. Right PICA not visualized. AICA and superior cerebellar arteries patent bilaterally. Venous sinuses: Limited venous enhancement Anatomic  variants: None Review of the MIP images confirms the above findings CT Brain Perfusion Findings: ASPECTS: 10 CBF (<30%) Volume: 4mL Perfusion (Tmax>6.0s) volume: 48mL Mismatch Volume: 73mL Infarction Location:None IMPRESSION: 1. CT perfusion negative for acute infarct or ischemia 2. Negative for intracranial large vessel occlusion 3. No significant carotid or vertebral artery stenosis. 4. These results were called by telephone at the time of interpretation on 11/05/2020 at 7:03 pm to provider Kambria [facility] , who verbally acknowledged these results. Electronically Signed   By: Marlan Palau M.D.   On: 11/05/2020 19:04   CT Angio Neck W and/or Wo Contrast  Result Date: 11/05/2020 CLINICAL DATA:  Acute neuro deficit. Left facial droop, headache and slurred speech EXAM: CT ANGIOGRAPHY HEAD AND NECK CT PERFUSION BRAIN TECHNIQUE: Multidetector CT imaging of the head and neck was performed using the standard protocol during bolus administration of intravenous contrast. Multiplanar CT image reconstructions and MIPs were obtained to evaluate the vascular anatomy. Carotid stenosis measurements (when  applicable) are obtained utilizing NASCET criteria, using the distal internal carotid diameter as the denominator. Multiphase CT imaging of the brain was performed following IV bolus contrast injection. Subsequent parametric perfusion maps were calculated using RAPID software. CONTRAST:  179mL OMNIPAQUE IOHEXOL 350 MG/ML SOLN COMPARISON:  CT head 11/05/2020 FINDINGS: CTA NECK FINDINGS Aortic arch: Mild atherosclerotic calcification aortic arch. Proximal great vessels widely patent. Right carotid system: Mild atherosclerotic disease right carotid bifurcation without significant stenosis Left carotid system: Mild atherosclerotic disease left carotid bifurcation without stenosis. Vertebral arteries: Both vertebral arteries widely patent without stenosis. Skeleton: No acute skeletal abnormality. Other neck: Thyromegaly without focal  lesion. Upper chest: Lung apices clear bilaterally. Review of the MIP images confirms the above findings CTA HEAD FINDINGS Anterior circulation: Mild atherosclerotic calcification in the cavernous carotid bilaterally without stenosis. Anterior and middle cerebral arteries normal bilaterally without stenosis or large vessel occlusion. Posterior circulation: Both vertebral arteries patent to the basilar. Basilar widely patent. Superior cerebellar and posterior cerebral arteries patent bilaterally without stenosis. Small left PICA patent. Right PICA not visualized. AICA and superior cerebellar arteries patent bilaterally. Venous sinuses: Limited venous enhancement Anatomic variants: None Review of the MIP images confirms the above findings CT Brain Perfusion Findings: ASPECTS: 10 CBF (<30%) Volume: 28mL Perfusion (Tmax>6.0s) volume: 33mL Mismatch Volume: 87mL Infarction Location:None IMPRESSION: 1. CT perfusion negative for acute infarct or ischemia 2. Negative for intracranial large vessel occlusion 3. No significant carotid or vertebral artery stenosis. 4. These results were called by telephone at the time of interpretation on 11/05/2020 at 7:03 pm to provider Thi [facility] , who verbally acknowledged these results. Electronically Signed   By: Franchot Gallo M.D.   On: 11/05/2020 19:04   MR BRAIN W WO CONTRAST  Result Date: 11/05/2020 CLINICAL DATA:  Acute neuro deficit rule out stroke EXAM: MRI HEAD WITHOUT AND WITH CONTRAST TECHNIQUE: Multiplanar, multiecho pulse sequences of the brain and surrounding structures were obtained without and with intravenous contrast. CONTRAST:  9.93mL GADAVIST GADOBUTROL 1 MMOL/ML IV SOLN COMPARISON:  CT angio head and CT perfusion 11/05/2020 FINDINGS: Brain: Small area of acute infarct in the left posterior midbrain in the roof of the fourth ventricle. Additional small area of acute infarct in the right thalamus. Minimal chronic white matter changes. Chronic lacunar infarction in the  right pons. Negative for hemorrhage or mass. Normal enhancement postcontrast administration. Vascular: Normal arterial flow voids Skull and upper cervical spine: No focal skeletal abnormality. Sinuses/Orbits: Mucosal edema left maxillary sinus. Mild mastoid effusion bilaterally. Normal orbit Other: None IMPRESSION: Acute infarct in the left posterior midbrain in the roof of the fourth ventricle. Small acute infarct right anterior thalamus. 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 Electronically Signed   By: Franchot Gallo M.D.   On: 11/05/2020 20:15   CT CEREBRAL PERFUSION W CONTRAST  Result Date: 11/05/2020 CLINICAL DATA:  Acute neuro deficit. Left facial droop, headache and slurred speech EXAM: CT ANGIOGRAPHY HEAD AND NECK CT PERFUSION BRAIN TECHNIQUE: Multidetector CT imaging of the head and neck was performed using the standard protocol during bolus administration of intravenous contrast. Multiplanar CT image reconstructions and MIPs were obtained to evaluate the vascular anatomy. Carotid stenosis measurements (when applicable) are obtained utilizing NASCET criteria, using the distal internal carotid diameter as the denominator. Multiphase CT imaging of the brain was performed following IV bolus contrast injection. Subsequent parametric perfusion maps were calculated using RAPID software. CONTRAST:  129mL OMNIPAQUE IOHEXOL 350 MG/ML SOLN COMPARISON:  CT head 11/05/2020 FINDINGS: CTA NECK  FINDINGS Aortic arch: Mild atherosclerotic calcification aortic arch. Proximal great vessels widely patent. Right carotid system: Mild atherosclerotic disease right carotid bifurcation without significant stenosis Left carotid system: Mild atherosclerotic disease left carotid bifurcation without stenosis. Vertebral arteries: Both vertebral arteries widely patent without stenosis. Skeleton: No acute skeletal abnormality. Other neck: Thyromegaly without focal lesion. Upper chest: Lung apices clear bilaterally. Review of  the MIP images confirms the above findings CTA HEAD FINDINGS Anterior circulation: Mild atherosclerotic calcification in the cavernous carotid bilaterally without stenosis. Anterior and middle cerebral arteries normal bilaterally without stenosis or large vessel occlusion. Posterior circulation: Both vertebral arteries patent to the basilar. Basilar widely patent. Superior cerebellar and posterior cerebral arteries patent bilaterally without stenosis. Small left PICA patent. Right PICA not visualized. AICA and superior cerebellar arteries patent bilaterally. Venous sinuses: Limited venous enhancement Anatomic variants: None Review of the MIP images confirms the above findings CT Brain Perfusion Findings: ASPECTS: 10 CBF (<30%) Volume: 0mL Perfusion (Tmax>6.0s) volume: 0mL Mismatch Volume: 0mL Infarction Location:None IMPRESSION: 1. CT perfusion negative for acute infarct or ischemia 2. Negative for intracranial large vessel occlusion 3. No significant carotid or vertebral artery stenosis. 4. These results were called by telephone at the time of interpretation on 11/05/2020 at 7:03 pm to provider [facility] , who verbally acknowledged these results. Electronically Signed   By: Marlan Palauharles  Clark M.D.   On: 11/05/2020 19:04   ECHOCARDIOGRAM COMPLETE BUBBLE STUDY  Result Date: 11/06/2020    ECHOCARDIOGRAM REPORT   Patient Name:   Marinell BlightWILLIETTA EVET Hall [facility] Date of Exam: 11/06/2020 Medical Rec #:  [id number]                   Height:       61.0 in Accession #:    [id number]                  Weight:       226.9 lb Date of Birth:  1971-06-10                   BSA:          1.993 m Patient Age:    49 years                    BP:           118/75 mmHg Patient Gender: F                           HR:           74 bpm. Exam Location:  Inpatient Procedure: 2D Echo and Saline Contrast Bubble Study Indications:    stroke 434.91  History:        Patient has no prior history of Echocardiogram examinations.                 Risk  Factors:Hypertension and cocaine use.  Sonographer:    Delcie RochLauren Pennington Referring Phys: [id number] Deno LungerGEORGE J SHALHOUB IMPRESSIONS  1. Left ventricular ejection fraction, by estimation, is 55 to 60%. The left ventricle has normal function. The left ventricle has no regional wall motion abnormalities. There is mild left ventricular hypertrophy. Left ventricular diastolic parameters were normal.  2. Right ventricular systolic function is normal. The right ventricular size is normal. Tricuspid regurgitation signal is inadequate for assessing PA pressure.  3. The mitral valve is grossly normal. Trivial mitral valve regurgitation.  4. The aortic valve is tricuspid.  Aortic valve regurgitation is not visualized.  5. The inferior vena cava is normal in size with greater than 50% respiratory variability, suggesting right atrial pressure of 3 mmHg.  6. Agitated saline contrast bubble study was negative, with no evidence of any interatrial shunt. FINDINGS  Left Ventricle: Left ventricular ejection fraction, by estimation, is 55 to 60%. The left ventricle has normal function. The left ventricle has no regional wall motion abnormalities. The left ventricular internal cavity size was normal in size. There is  mild left ventricular hypertrophy. Left ventricular diastolic parameters were normal. Right Ventricle: The right ventricular size is normal. No increase in right ventricular wall thickness. Right ventricular systolic function is normal. Tricuspid regurgitation signal is inadequate for assessing PA pressure. Left Atrium: Left atrial size was normal in size. Right Atrium: Right atrial size was normal in size. Pericardium: There is no evidence of pericardial effusion. Mitral Valve: The mitral valve is grossly normal. Mild mitral annular calcification. Trivial mitral valve regurgitation. Tricuspid Valve: The tricuspid valve is grossly normal. Tricuspid valve regurgitation is trivial. Aortic Valve: The aortic valve is tricuspid.  There is mild aortic valve annular calcification. Aortic valve regurgitation is not visualized. Pulmonic Valve: The pulmonic valve was grossly normal. Pulmonic valve regurgitation is trivial. Aorta: The aortic root is normal in size and structure. Venous: The inferior vena cava is normal in size with greater than 50% respiratory variability, suggesting right atrial pressure of 3 mmHg. IAS/Shunts: No atrial level shunt detected by color flow Doppler. Agitated saline contrast was given intravenously to evaluate for intracardiac shunting. Agitated saline contrast bubble study was negative, with no evidence of any interatrial shunt.  LEFT VENTRICLE PLAX 2D LVIDd:         4.10 cm  Diastology LVIDs:         3.00 cm  LV e' medial:    6.85 cm/s LV PW:         1.20 cm  LV E/e' medial:  15.6 LV IVS:        1.10 cm  LV e' lateral:   6.31 cm/s LVOT diam:     1.70 cm  LV E/e' lateral: 17.0 LV SV:         51 LV SV Index:   26 LVOT Area:     2.27 cm  RIGHT VENTRICLE             IVC RV S prime:     14.90 cm/s  IVC diam: 1.10 cm TAPSE (M-mode): 2.4 cm LEFT ATRIUM             Index       RIGHT ATRIUM           Index LA diam:        4.10 cm 2.06 cm/m  RA Area:     10.70 cm LA Vol (A2C):   55.7 ml 27.95 ml/m RA Volume:   21.40 ml  10.74 ml/m LA Vol (A4C):   61.5 ml 30.86 ml/m LA Biplane Vol: 59.0 ml 29.61 ml/m  AORTIC VALVE LVOT Vmax:   115.00 cm/s LVOT Vmean:  76.600 cm/s LVOT VTI:    0.225 m  AORTA Ao Root diam: 2.70 cm Ao Asc diam:  3.00 cm MITRAL VALVE MV Area (PHT): 3.53 cm     SHUNTS MV Decel Time: 215 msec     Systemic VTI:  0.22 m MV E velocity: 107.00 cm/s  Systemic Diam: 1.70 cm MV A velocity: 79.60 cm/s MV E/A ratio:  1.34  Nona Dell MD Electronically signed by Nona Dell MD Signature Date/Time: 11/06/2020/4:42:30 PM    Final    CT HEAD CODE STROKE WO CONTRAST  Result Date: 11/05/2020 CLINICAL DATA:  Code stroke. Acute neuro deficit. Left facial droop headache slurred speech EXAM: CT HEAD WITHOUT CONTRAST  TECHNIQUE: Contiguous axial images were obtained from the base of the skull through the vertex without intravenous contrast. COMPARISON:  None. FINDINGS: Brain: No evidence of acute infarction, hemorrhage, hydrocephalus, extra-axial collection or mass lesion/mass effect. Vascular: Negative for hyperdense vessel Skull: Negative Sinuses/Orbits: Mild mucosal edema left maxillary sinus otherwise clear. Normal orbit Other: None ASPECTS (Alberta Stroke Program Early CT Score) - Ganglionic level infarction (caudate, lentiform nuclei, internal capsule, insula, M1-M3 cortex): 7 - Supraganglionic infarction (M4-M6 cortex): 3 Total score (0-10 with 10 being normal): 10 IMPRESSION: 1. Negative CT of the brain 2. ASPECTS is 10 3. Code stroke imaging results were communicated on 11/05/2020 at 6:26 pm to provider Lindzen via text page Electronically Signed   By: Marlan Palau M.D.   On: 11/05/2020 18:27      Subjective: Patient seen and examined bedside, resting comfortably.  No complaints this morning.  Ready for discharge home.  Requesting that she receive her Covid-19 vaccination card prior to discharge.  Denies headache, no fever/chills/night sweats, no nausea/fine/diarrhea, no chest pain, no palpitations, no shortness of breath, no abdominal pain, no weakness, no fatigue, no paresthesias.  No acute events overnight per nursing staff.  Discharge Exam: Vitals:   11/08/20 2336 11/09/20 0228  BP: (!) 159/95 127/79  Pulse: 93 81  Resp: 18 20  Temp: 98.1 F (36.7 C) 98.1 F (36.7 C)  SpO2: 99% 98%   Vitals:   11/08/20 1532 11/08/20 1828 11/08/20 2336 11/09/20 0228  BP: (!) 130/107 (!) 145/78 (!) 159/95 127/79  Pulse: 82 80 93 81  Resp: 18 16 18 20   Temp: 98.5 F (36.9 C) 98.2 F (36.8 C) 98.1 F (36.7 C) 98.1 F (36.7 C)  TempSrc: Axillary Axillary Axillary Oral  SpO2: 99% 98% 99% 98%  Weight:      Height:        General: Pt is alert, awake, not in acute distress Cardiovascular: RRR, S1/S2 +, no  rubs, no gallops Respiratory: CTA bilaterally, no wheezing, no rhonchi Abdominal: Soft, NT, ND, bowel sounds + Extremities: no edema, no cyanosis    The results of significant diagnostics from this hospitalization (including imaging, microbiology, ancillary and laboratory) are listed below for reference.     Microbiology: Recent Results (from the past 240 hour(s))  Resp Panel by RT-PCR (Flu A&B, Covid) Nasopharyngeal Swab     Status: None   Collection Time: 11/05/20 10:09 PM   Specimen: Nasopharyngeal Swab; Nasopharyngeal(NP) swabs in vial transport medium  Result Value Ref Range Status   SARS Coronavirus 2 by RT PCR NEGATIVE NEGATIVE Final    Comment: (NOTE) SARS-CoV-2 target nucleic acids are NOT DETECTED.  The SARS-CoV-2 RNA is generally detectable in upper respiratory specimens during the acute phase of infection. The lowest concentration of SARS-CoV-2 viral copies this assay can detect is 138 copies/mL. A negative result does not preclude SARS-Cov-2 infection and should not be used as the sole basis for treatment or other patient management decisions. A negative result may occur with  improper specimen collection/handling, submission of specimen other than nasopharyngeal swab, presence of viral mutation(s) within the areas targeted by this assay, and inadequate number of viral copies(<138 copies/mL). A negative result must be combined with clinical  observations, patient history, and epidemiological information. The expected result is Negative.  Fact Sheet for Patients:  BloggerCourse.com  Fact Sheet for Healthcare Providers:  SeriousBroker.it  This test is no t yet approved or cleared by the [place] FDA and  has been authorized for detection and/or diagnosis of SARS-CoV-2 by FDA under an Emergency Use Authorization (EUA). This EUA will remain  in effect (meaning this test can be used) for the duration of  the COVID-19 declaration under Section 564(b)(1) of the Act, 21 U.S.C.section 360bbb-3(b)(1), unless the authorization is terminated  or revoked sooner.       Influenza A by PCR NEGATIVE NEGATIVE Final   Influenza B by PCR NEGATIVE NEGATIVE Final    Comment: (NOTE) The Xpert Xpress SARS-CoV-2/FLU/RSV plus assay is intended as an aid in the diagnosis of influenza from Nasopharyngeal swab specimens and should not be used as a sole basis for treatment. Nasal washings and aspirates are unacceptable for Xpert Xpress SARS-CoV-2/FLU/RSV testing.  Fact Sheet for Patients: BloggerCourse.com  Fact Sheet for Healthcare Providers: SeriousBroker.it  This test is not yet approved or cleared by the [place] FDA and has been authorized for detection and/or diagnosis of SARS-CoV-2 by FDA under an Emergency Use Authorization (EUA). This EUA will remain in effect (meaning this test can be used) for the duration of the COVID-19 declaration under Section 564(b)(1) of the Act, 21 U.S.C. section 360bbb-3(b)(1), unless the authorization is terminated or revoked.  Performed at [facility], [address] [place], [place] [address]      Labs: BNP (last 3 results) No results for input(s): BNP in the last 8760 hours. Basic Metabolic Panel: Recent Labs  Lab 11/05/20 1806 11/05/20 1813 11/06/20 0458 11/09/20 0325  NA 138 140 142 138  K 4.1 4.1 3.8 4.1  CL 102 103 104 102  CO2 24  --  24 25  GLUCOSE 122* 119* 123* 114*  BUN 14 18 14 13   CREATININE 1.39* 1.30* 1.11* 0.87  CALCIUM 9.3  --  9.0 8.6*   Liver Function Tests: Recent Labs  Lab 11/05/20 1806 11/06/20 0458  AST 20 15  ALT 12 11  ALKPHOS 61 55  BILITOT 0.7 0.6  PROT 6.5 6.0*  ALBUMIN 3.5 3.3*   Recent Labs  Lab 11/06/20 0614  LIPASE 27   No results for input(s): AMMONIA in the last 168 hours. CBC: Recent Labs  Lab 11/05/20 1806 11/05/20 1813  WBC 8.1   --   NEUTROABS 5.6  --   HGB 13.9 14.6  HCT 41.7 43.0  MCV 92.5  --   PLT 159  --    Cardiac Enzymes: No results for input(s): CKTOTAL, CKMB, CKMBINDEX, TROPONINI in the last 168 hours. BNP: Invalid input(s): POCBNP CBG: Recent Labs  Lab 11/05/20 1805  GLUCAP 125*   D-Dimer No results for input(s): DDIMER in the last 72 hours. Hgb A1c No results for input(s): HGBA1C in the last 72 hours. Lipid Profile No results for input(s): CHOL, HDL, LDLCALC, TRIG, CHOLHDL, LDLDIRECT in the last 72 hours. Thyroid function studies No results for input(s): TSH, T4TOTAL, T3FREE, THYROIDAB in the last 72 hours.  Invalid input(s): FREET3 Anemia work up No results for input(s): VITAMINB12, FOLATE, FERRITIN, TIBC, IRON, RETICCTPCT in the last 72 hours. Urinalysis    Component Value Date/Time   COLORURINE YELLOW 11/06/2020 1334   APPEARANCEUR CLEAR 11/06/2020 1334   LABSPEC 1.013 11/06/2020 1334   PHURINE 6.0 11/06/2020 1334   GLUCOSEU NEGATIVE 11/06/2020 1334  HGBUR NEGATIVE 11/06/2020 1334   BILIRUBINUR NEGATIVE 11/06/2020 1334   KETONESUR NEGATIVE 11/06/2020 1334   PROTEINUR NEGATIVE 11/06/2020 1334   UROBILINOGEN 0.2 08/07/2020 1139   NITRITE NEGATIVE 11/06/2020 1334   LEUKOCYTESUR TRACE (A) 11/06/2020 1334   Sepsis Labs Invalid input(s): PROCALCITONIN,  WBC,  LACTICIDVEN Microbiology Recent Results (from the past 240 hour(s))  Resp Panel by RT-PCR (Flu A&B, Covid) Nasopharyngeal Swab     Status: None   Collection Time: 11/05/20 10:09 PM   Specimen: Nasopharyngeal Swab; Nasopharyngeal(NP) swabs in vial transport medium  Result Value Ref Range Status   SARS Coronavirus 2 by RT PCR NEGATIVE NEGATIVE Final    Comment: (NOTE) SARS-CoV-2 target nucleic acids are NOT DETECTED.  The SARS-CoV-2 RNA is generally detectable in upper respiratory specimens during the acute phase of infection. The lowest concentration of SARS-CoV-2 viral copies this assay can detect is 138 copies/mL. A  negative result does not preclude SARS-Cov-2 infection and should not be used as the sole basis for treatment or other patient management decisions. A negative result may occur with  improper specimen collection/handling, submission of specimen other than nasopharyngeal swab, presence of viral mutation(s) within the areas targeted by this assay, and inadequate number of viral copies(<138 copies/mL). A negative result must be combined with clinical observations, patient history, and epidemiological information. The expected result is Negative.  Fact Sheet for Patients:  BloggerCourse.com  Fact Sheet for Healthcare Providers:  SeriousBroker.it  This test is no t yet approved or cleared by the [place] FDA and  has been authorized for detection and/or diagnosis of SARS-CoV-2 by FDA under an Emergency Use Authorization (EUA). This EUA will remain  in effect (meaning this test can be used) for the duration of the COVID-19 declaration under Section 564(b)(1) of the Act, 21 U.S.C.section 360bbb-3(b)(1), unless the authorization is terminated  or revoked sooner.       Influenza A by PCR NEGATIVE NEGATIVE Final   Influenza B by PCR NEGATIVE NEGATIVE Final    Comment: (NOTE) The Xpert Xpress SARS-CoV-2/FLU/RSV plus assay is intended as an aid in the diagnosis of influenza from Nasopharyngeal swab specimens and should not be used as a sole basis for treatment. Nasal washings and aspirates are unacceptable for Xpert Xpress SARS-CoV-2/FLU/RSV testing.  Fact Sheet for Patients: BloggerCourse.com  Fact Sheet for Healthcare Providers: SeriousBroker.it  This test is not yet approved or cleared by the [place] FDA and has been authorized for detection and/or diagnosis of SARS-CoV-2 by FDA under an Emergency Use Authorization (EUA). This EUA will remain in effect (meaning this test can  be used) for the duration of the COVID-19 declaration under Section 564(b)(1) of the Act, 21 U.S.C. section 360bbb-3(b)(1), unless the authorization is terminated or revoked.  Performed at [facility], [address] [place], [place] [address]      Time coordinating discharge: Over 30 minutes  SIGNED:   Alvira Philips [place], DO  [facility] 11/09/2020, 8:12 AM

## 2020-11-09 NOTE — Discharge Instructions (Signed)
Hospital Discharge After a Stroke  Being discharged from the hospital after a stroke can feel overwhelming. Many things may be different, and it is normal to feel scared or anxious. Some stroke survivors may be able to return to their homes, and others may need more specialized care on a temporary or permanent basis. Your stroke care team will work with you to develop a discharge plan that is best for you. Ask questions if you do not understand something. Invite a friend or family member to participate in discharge planning. Understanding and following your discharge plan can help to prevent another stroke or other problems. Understanding your medicines After a stroke, your health care provider may prescribe one or more types of medicine. It is important to take medicines exactly as told by your health care provider. Serious harm, such as another stroke, can happen if you are unable to take your medicine exactly as prescribed. Make sure you understand:  What medicine to take.  Why you are taking the medicine.  How and when to take it.  If it can be taken with your other medicines and herbal supplements.  Possible side effects.  When to call your health care provider if you have any side effects.  How you will get and pay for your medicines. Medical assistance programs may be able to help you pay for prescription medicines if you cannot afford them. If you are taking an anticoagulant, be sure to take it exactly as told by your health care provider. This type of medicine can increase the risk of bleeding because it works to prevent blood from clotting. You may need to take certain precautions to prevent bleeding. You should contact your health care provider if you have:  Bleeding or bruising.  A fall or other injury to your head.  Blood in your urine or stool (feces). Planning for home safety  Take steps to prevent falls, such as installing grab bars or using a shower chair. Ask a friend or  family member to get needed things in place before you go home if possible. A therapist can come to your home to make recommendations for safety equipment. Ask your health care provider if you would benefit from this service or from home care. Getting needed equipment Ask your health care provider for a list of any medical equipment and supplies you will need at home. These may include items such as:  Walkers.  Canes.  Wheelchairs.  Hand-strengthening devices.  Special eating utensils. Medical equipment can be rented or purchased, depending on your insurance coverage. Check with your insurance company about what is covered. Keeping follow-up visits After a stroke, you will need to follow up regularly with a health care provider. You may also need rehabilitation, which can include physical therapy, occupational therapy, or speech-language therapy. Keeping these appointments is very important to your recovery after a stroke. Be sure to bring your medicine list and discharge papers with you to your appointments. If you need help to keep track of your schedule, use a calendar or appointment reminder. Preventing another stroke Having a stroke puts you at risk for another stroke in the future. Ask your health care provider what actions you can take to lower the risk. These may include:  Increasing how much you exercise.  Making a healthy eating plan.  Quitting smoking.  Managing other health conditions, such as high blood pressure, high cholesterol, or diabetes.  Limiting alcohol use. Knowing the warning signs of a stroke  Make sure  you understand the signs of a stroke. Before you leave the hospital, you will receive information outlining the stroke warning signs. Share these with your friends and family members. "BE FAST" is an easy way to remember the main warning signs of a stroke:  B - Balance. Signs are dizziness, sudden trouble walking, or loss of balance.  E - Eyes. Signs are  trouble seeing or a sudden change in vision.  F - Face. Signs are sudden weakness or numbness of the face, or the face or eyelid drooping on one side.  A - Arms. Signs are weakness or numbness in an arm. This happens suddenly and usually on one side of the body.  S - Speech. Signs are sudden trouble speaking, slurred speech, or trouble understanding what people say.  T - Time. Time to call emergency services. Write down what time symptoms started. Other signs of stroke may include:  A sudden, severe headache with no known cause.  Nausea or vomiting.  Seizure. These symptoms may represent a serious problem that is an emergency. Do not wait to see if the symptoms will go away. Get medical help right away. Call your local emergency services (911 in the [place] Do not drive yourself to the hospital. Make note of the time that you had your first symptoms. Your emergency responders or emergency room staff will need to know this information. Summary  Being discharged from the hospital after a stroke can feel overwhelming. It is normal to feel scared or anxious.  Make sure you take medicines exactly as told by your health care provider.  Know the warning signs of a stroke, and get help right way if you have any of these symptoms. "BE FAST" is an easy way to remember the main warning signs of a stroke. This information is not intended to replace advice given to you by your health care provider. Make sure you discuss any questions you have with your health care provider. Document Revised: 07/15/2019 Document Reviewed: 01/25/2017 Elsevier Patient Education  2020 [employer].   Rehabilitation After a Stroke, Adult A stroke causes damage to the brain cells, which can affect your ability to walk, talk, or remember things. The impact of a stroke is different for everyone, and so is recovery. Some people have progress during the first few days after treatment. Others may take weeks or longer to make  progress. Stroke rehabilitation includes a variety of treatments to help you recover and promote your independence after a stroke. You may not be able do everything that you did before the stroke, but you can learn ways to manage your lifestyle and be as independent as possible. Rehabilitation will start as soon as you are able to participate after your stroke, and it involves care from a team that may include:  Family and friends. Your loved ones know you best and can be very helpful in your recovery.  Physicians.  Nurses.  Physical and occupational therapists.  Speech-language therapists.  A nutritionist.  A psychologist.  A Child psychotherapist. Keep open communication with all members of your care team. Share your medical records if needed, and take notes about each provider's recommendations. What is physical therapy? Physical therapists (PTs) help you to improve your coordination, balance, and muscle strength. Physical therapy may involve:  Range of motion exercises.  Help to move between lying, sitting, and standing positions.  Walking with a cane or walker, if needed.  Help using stairs. What is occupational therapy? Occupational therapists (OTs) help  you rebuild your ability to do everyday tasks, such as brushing your teeth, going to the bathroom, eating, and getting dressed. Occupational therapy may also help with:  Vision. Visual scanning is a technique that is used to prevent falls.  Memory and cognitive training. This therapy includes problem-solving techniques and relearning tasks like making a phone call.  Fine muscle movements such as buttoning a shirt or picking up small objects. What is speech therapy? Speech-language therapists help you communicate. After a stroke, you may have problems understanding what people are saying, or you may have trouble writing, speaking, or finding the right word for what you want to say. You may also need speech therapy if you have  difficulty swallowing while eating and drinking. Examples of speech-language therapies include:  Techniques to strengthen muscles used in swallowing.  Naming objects or describing pictures. This helps retrain the brain to recognize and remember words.  Exercises to strengthen the muscles involved in talking, including your tongue and lips.  Exercises to retrain your brain in understanding what you read and hear. How often will I need therapy? Therapy will begin as soon as you are able to participate, which is often within the first few days after a stroke. Sessions will be frequent at first. For example, you may have therapy 2-3 hours a day on most days of the week during the first few months. The intensity depends on the type and severity of your stroke. You may need therapy for several months. Therapy may take place in the hospital, at a rehabilitation center, or in your home. Are there any side effects of therapy? Therapy is safe and is usually well-tolerated. You may feel physically and mentally tired after therapy, especially during the first few weeks. Rest before therapy sessions if you need to so you can get the most out of your rehabilitation. Follow these instructions at home:  Involve your family and friends in your recovery, if possible. Having another person to encourage you is beneficial.  Follow instructions from your speech-language therapist, nutritionist, or health care provider about what you can safely eat and drink. Eat healthy foods. If your ability to swallow was affected by the stroke, you may need to take steps to avoid choking, such as: ? Taking small bites when eating. ? Eating foods that are soft or pureed. ? Drinking liquids that have been thickened.  Maintain social connections and interactions with friends, family, and community groups. This is an important part of your recovery. Communication challenges and physical challenges may cause you to feel isolated after  a stroke.  Consider joining a support group that allows you to talk about the impact of stroke on your life. A psychologist or counselor may be recommended. Your emotional recovery from stroke is just as important as your physical recovery.  Keep all follow-up visits as told by your health care providers. This is important. Summary  Stroke rehabilitation includes a variety of treatments to help you recover and promote your independence after a stroke.  Rehabilitation will start as soon as you are able to participate after your stroke, and it includes care from a team of experts.  The intensity of therapy depends on the type and severity of your stroke. You may need therapy for several months. This information is not intended to replace advice given to you by your health care provider. Make sure you discuss any questions you have with your health care provider. Document Revised: 02/11/2019 Document Reviewed: 10/23/2016 Elsevier Patient Education  2020 [employer] Inc[employer]   Eating Plan After Stroke A stroke causes damage to the brain cells, which can affect your ability to walk, talk, and even eat. The impact of a stroke is different for everyone, and so is recovery. A good nutrition plan is important for your recovery. It can also lower your risk of another stroke. If you have difficulty chewing and swallowing your food, a dietitian or your stroke care team can help so that you can enjoy eating healthy foods. What are tips for following this plan?  Reading food labels  Choose foods that have less than 300 milligrams (mg) of sodium per serving. Limit your sodium intake to less than 1,500 mg per day.  Avoid foods that have saturated fat and trans fat.  Choose foods that are low in cholesterol. Limit the amount of cholesterol you eat each day to less than 200 mg.  Choose foods that are high in fiber. Eat 20-30 grams (g) of fiber each day.  Avoid foods with added sugar. Check the food label for  ingredients such as sugar, corn syrup, honey, fructose, molasses, and cane juice. Shopping  At the grocery store, buy most of your food from areas near the walls of the store. This includes: ? Fresh fruits and vegetables. ? Dry grains, beans, nuts, and seeds. ? Fresh seafood, poultry, lean meats, and eggs. ? Low-fat dairy products.  Buy whole ingredients instead of prepackaged foods.  Buy fresh, in-season fruits and vegetables from local farmers markets.  Buy frozen fruits and vegetables in resealable bags. Cooking  Prepare foods with very little salt. Use herbs or salt-free spices instead.  Cook with heart-healthy oils, such as olive, avocado, canola, soybean, or sunflower oil.  Avoid frying foods. Bake, grill, or broil foods instead.  Remove visible fat and skin from meat and poultry before eating.  Modify food textures as told by your health care provider. Meal planning  Eat a wide variety of colorful fruits and vegetables. Make sure one-half of your plate is filled with fruits and vegetables at each meal.  Eat fruits and vegetables that are high in potassium, such as: ? Apples, bananas, oranges, and melon. ? Sweet potatoes, spinach, zucchini, and tomatoes.  Eat fish that contain heart-healthy fats (omega-3 fats) at least twice a week. These include salmon, tuna, mackerel, and sardines.  Eat plant foods that are high in omega-3 fats, such as flaxseeds and walnuts. Add these to cereals, yogurt, or pasta dishes.  Eat several servings of high-fiber foods each day, such as fruits, vegetables, whole grains, and beans.  Do not put salt at the table for meals.  When eating out at restaurants: ? Ask the server about low-salt or salt-free food options. ? Avoid fried foods. Look for menu items that are grilled, steamed, broiled, or roasted. ? Ask if your food can be prepared without butter. ? Ask for condiments, such as salad dressings, gravy, or sauces to be served on the  side.  If you have difficulty swallowing: ? Choose foods that are softer and easier to chew and swallow. ? Cut foods into small pieces and chew well before swallowing. ? Thicken liquids as told by your health care provider or dietitian. ? Let your health care provider know if your condition does not improve over time. You may need to work with a speech therapist to re-train the muscles that are used for eating. General recommendations  Involve your family and friends in your recovery, if possible. It may be helpful  to have a slower meal time and to plan meals that include foods everyone in the family can eat.  Brush your teeth with fluoride toothpaste twice a day, and floss once a day. Keeping a clean mouth can help you swallow and can also help your appetite.  Drink enough water each day to keep your urine pale yellow. If needed, set reminders or ask your family to help you remember to drink water.  Limit alcohol intake to no more than 1 drink a day for nonpregnant women and 2 drinks a day for men. One drink equals 12 oz of beer, 5 oz of wine, or 1 oz of hard liquor. Summary  Following this eating plan can help in your stroke recovery and can decrease your risk for another stroke.  Let your health care provider know if you have problems with swallowing. You may need to work with a speech therapist. This information is not intended to replace advice given to you by your health care provider. Make sure you discuss any questions you have with your health care provider. Document Revised: 02/12/2019 Document Reviewed: 12/30/2017 Elsevier Patient Education  2020 [employer].  Finding Treatment for Addiction Addiction is a complex disease of the brain that causes an uncontrollable (compulsive) need for:  A substance. This includes alcohol, illegal drugs, or prescription medicines, such as painkillers.  An activity or behavior, such as gambling or shopping. Addiction changes the way your  brain works. Because of this change:  The need for the medicine, drug, or activity can become so strong that you think about it all the time.  Getting more and more of your addiction becomes the most important thing to you.  You may find yourself leaving other activities and relationships to pursue your addiction.  You can become physically dependent on a substance.  Your health, behavior, emotions, and relationships can change for the worse. How do I know if I need treatment for addiction? Addiction is a progressive disease. Without treatment, addiction can get worse. Living with addiction puts you at higher risk for injury, poor health, loss of employment, loss of money, and even death. You might need treatment for addiction if:  You have tried to stop or cut down, but you have not succeeded.  You find it annoying that your friends and family are concerned about your use or behavior.  You feel guilty about your use or behavior.  You need a particular substance or activity to start your day or to calm down.  You are running out of money because of your addiction.  You have done something illegal to support your addiction.  Your addiction has caused you: ? Health problems. ? Trouble in school, work, home, or with the police. ? To devote all your time to your addiction, and not to other responsibilities. ? To tell lies in order to hide your problem. What types of treatment are available? There may be options for treatment programs and plans based on your addiction, condition, needs, and preferences. No single treatment is right for everyone.  Treatment programs can be: ? Outpatient. You live at home and go to work or school, but you go to a clinic for treatment. ? Inpatient. You live and sleep at the program facility during treatment.  Programs may include: ? Medicine. You may need medicine to treat the addiction itself, or to treat anxiety or depression. ? Counseling and  behavior therapy. This can help individuals and families behave in healthier ways and relate  more effectively. ? Support groups. Confidential group therapy, such as a 12-step program, can help individuals and families during treatment and recovery. ? A combination of education, counseling, and a 12-step, spirituality-based approach. What should I consider when selecting a treatment program? Think about your individual requirements when selecting a treatment program. Ask about:  The overall approach to treatment. ? Some programs are strictly 12-step programs. Some have a more flexible approach. ? Programs may differ in length of stay, setting, and size. ? Some programs include your family in your treatment plan. Support may be offered to them throughout the treatment process, as well as instructions for them when you are discharged. ? You may continue to receive support after you have left the program.  The types of medical services that are offered. Find out if the program: ? Offers specific treatment for your particular addiction. ? Meets all of your needs, including physical and cultural needs. ? Includes any medicines you might need. ? Offers mental health counseling as part of your treatment. ? Offers the 12-step meetings at the center, or if transport is available for patients to attend meetings at other locations.  The cost and types of insurance that are accepted. ? Some programs are sponsored by the government. They support patients who do not have private insurance. ? If you do not have insurance, or if you choose to attend a program that does not accept your insurance, call the treatment center. Tell them your financial needs and whether a payment plan can be set up. ? There are also organizations that will help you find the resources for treatment. You can find them online by searching "treatment for addiction."  If the program is certified by the appropriate government  agency. Where to find support  Your health care provider can help you to find the right treatment. These discussions are confidential.  The [employer] on Alcoholism and Drug Dependence (NCADD). This group has information about treatment centers and programs for people who have an addiction and for family members. ? Call: 1-800-NCA-CALL (740-306-2611). ? Visit the website: https://www.ncadd.org/  The Substance Abuse and Mental Health Services [facility]). This organization will help you find publicly funded treatment centers, help hotlines, and counseling services near you. ? Call: 1-800-662-HELP (520-163-0150). ? Visit the website: www.findtreatment.[employer].pl  The National Problem Gambling Helpline. This is a 24-hour confidential helpline for gambling addiction. ? Call: 956-327-6511 ? Visit the website: [employer] In countries outside of the [place] and [place], look in [employer] for contact information for services in your area. Follow these instructions at home:  Find supportive people who will help you stay away from your addiction and stay sober.  Do not use the substance or engage in the activity.  If you have been through treatment: ? Follow your plan. The plan is usually developed by you and your health care provider during treatment. ? Go to meetings with other people in recovery. ? Avoid people, situations, and things that lead you to do the things you are addicted to (triggers). Summary  Addiction changes the way your brain works. These changes cause a desire to repeat and increase the use of the a substance or behavior.  Addiction is a progressive disease. Without treatment, addiction can get worse. Living with addiction puts you at higher risk for injury, poor health, loss of employment, loss of money, and even death.  There may be options for treatment programs and plans based on your addiction, condition, needs, and  preferences.  No single treatment is right for everyone.  Your health care provider can help you to find the right treatment. These discussions are confidential. This information is not intended to replace advice given to you by your health care provider. Make sure you discuss any questions you have with your health care provider. Document Revised: 07/15/2019 Document Reviewed: 11/20/2017 Elsevier Patient Education  [place].

## 2020-11-10 ENCOUNTER — Emergency Department (HOSPITAL_COMMUNITY): Payer: Self-pay

## 2020-11-10 ENCOUNTER — Other Ambulatory Visit: Payer: Self-pay

## 2020-11-10 ENCOUNTER — Inpatient Hospital Stay (HOSPITAL_COMMUNITY)
Admission: EM | Admit: 2020-11-10 | Discharge: 2020-11-30 | DRG: 917 | Disposition: A | Discharge status: Skilled Nursing Facility | Payer: Self-pay | Attending: Internal Medicine | Admitting: Internal Medicine

## 2020-11-10 DIAGNOSIS — T405X1A Poisoning by cocaine, accidental (unintentional), initial encounter: Principal | ICD-10-CM | POA: Diagnosis present

## 2020-11-10 DIAGNOSIS — E785 Hyperlipidemia, unspecified: Secondary | ICD-10-CM | POA: Diagnosis present

## 2020-11-10 DIAGNOSIS — Z79899 Other long term (current) drug therapy: Secondary | ICD-10-CM

## 2020-11-10 DIAGNOSIS — Z6841 Body Mass Index (BMI) 40.0 and over, adult: Secondary | ICD-10-CM | POA: Diagnosis present

## 2020-11-10 DIAGNOSIS — J690 Pneumonitis due to inhalation of food and vomit: Secondary | ICD-10-CM | POA: Diagnosis not present

## 2020-11-10 DIAGNOSIS — G4733 Obstructive sleep apnea (adult) (pediatric): Secondary | ICD-10-CM | POA: Diagnosis present

## 2020-11-10 DIAGNOSIS — F419 Anxiety disorder, unspecified: Secondary | ICD-10-CM | POA: Diagnosis present

## 2020-11-10 DIAGNOSIS — T17908A Unspecified foreign body in respiratory tract, part unspecified causing other injury, initial encounter: Secondary | ICD-10-CM

## 2020-11-10 DIAGNOSIS — Z833 Family history of diabetes mellitus: Secondary | ICD-10-CM

## 2020-11-10 DIAGNOSIS — S83511D Sprain of anterior cruciate ligament of right knee, subsequent encounter: Secondary | ICD-10-CM

## 2020-11-10 DIAGNOSIS — Z886 Allergy status to analgesic agent status: Secondary | ICD-10-CM

## 2020-11-10 DIAGNOSIS — R059 Cough, unspecified: Secondary | ICD-10-CM

## 2020-11-10 DIAGNOSIS — R29810 Facial weakness: Secondary | ICD-10-CM | POA: Diagnosis present

## 2020-11-10 DIAGNOSIS — G629 Polyneuropathy, unspecified: Secondary | ICD-10-CM | POA: Diagnosis present

## 2020-11-10 DIAGNOSIS — F32A Depression, unspecified: Secondary | ICD-10-CM

## 2020-11-10 DIAGNOSIS — B370 Candidal stomatitis: Secondary | ICD-10-CM | POA: Diagnosis not present

## 2020-11-10 DIAGNOSIS — Z20822 Contact with and (suspected) exposure to covid-19: Secondary | ICD-10-CM | POA: Diagnosis present

## 2020-11-10 DIAGNOSIS — R531 Weakness: Secondary | ICD-10-CM

## 2020-11-10 DIAGNOSIS — R6889 Other general symptoms and signs: Secondary | ICD-10-CM

## 2020-11-10 DIAGNOSIS — I739 Peripheral vascular disease, unspecified: Secondary | ICD-10-CM | POA: Diagnosis present

## 2020-11-10 DIAGNOSIS — Z7902 Long term (current) use of antithrombotics/antiplatelets: Secondary | ICD-10-CM

## 2020-11-10 DIAGNOSIS — F4310 Post-traumatic stress disorder, unspecified: Secondary | ICD-10-CM | POA: Diagnosis present

## 2020-11-10 DIAGNOSIS — R0602 Shortness of breath: Secondary | ICD-10-CM

## 2020-11-10 DIAGNOSIS — E1151 Type 2 diabetes mellitus with diabetic peripheral angiopathy without gangrene: Secondary | ICD-10-CM | POA: Diagnosis present

## 2020-11-10 DIAGNOSIS — Z7982 Long term (current) use of aspirin: Secondary | ICD-10-CM

## 2020-11-10 DIAGNOSIS — K219 Gastro-esophageal reflux disease without esophagitis: Secondary | ICD-10-CM

## 2020-11-10 DIAGNOSIS — Z23 Encounter for immunization: Secondary | ICD-10-CM

## 2020-11-10 DIAGNOSIS — N179 Acute kidney failure, unspecified: Secondary | ICD-10-CM | POA: Diagnosis present

## 2020-11-10 DIAGNOSIS — G8194 Hemiplegia, unspecified affecting left nondominant side: Secondary | ICD-10-CM | POA: Diagnosis present

## 2020-11-10 DIAGNOSIS — I639 Cerebral infarction, unspecified: Secondary | ICD-10-CM | POA: Diagnosis present

## 2020-11-10 DIAGNOSIS — F17210 Nicotine dependence, cigarettes, uncomplicated: Secondary | ICD-10-CM | POA: Diagnosis present

## 2020-11-10 DIAGNOSIS — E66813 Obesity, class 3: Secondary | ICD-10-CM | POA: Diagnosis present

## 2020-11-10 DIAGNOSIS — R471 Dysarthria and anarthria: Secondary | ICD-10-CM | POA: Diagnosis present

## 2020-11-10 DIAGNOSIS — I160 Hypertensive urgency: Secondary | ICD-10-CM | POA: Diagnosis present

## 2020-11-10 DIAGNOSIS — F1420 Cocaine dependence, uncomplicated: Secondary | ICD-10-CM | POA: Diagnosis present

## 2020-11-10 DIAGNOSIS — J449 Chronic obstructive pulmonary disease, unspecified: Secondary | ICD-10-CM

## 2020-11-10 DIAGNOSIS — I428 Other cardiomyopathies: Secondary | ICD-10-CM | POA: Diagnosis present

## 2020-11-10 DIAGNOSIS — S83519A Sprain of anterior cruciate ligament of unspecified knee, initial encounter: Secondary | ICD-10-CM

## 2020-11-10 DIAGNOSIS — I6340 Cerebral infarction due to embolism of unspecified cerebral artery: Secondary | ICD-10-CM | POA: Diagnosis present

## 2020-11-10 DIAGNOSIS — R29705 NIHSS score 5: Secondary | ICD-10-CM | POA: Diagnosis present

## 2020-11-10 DIAGNOSIS — M25561 Pain in right knee: Secondary | ICD-10-CM

## 2020-11-10 DIAGNOSIS — I10 Essential (primary) hypertension: Secondary | ICD-10-CM | POA: Diagnosis present

## 2020-11-10 DIAGNOSIS — Z8673 Personal history of transient ischemic attack (TIA), and cerebral infarction without residual deficits: Secondary | ICD-10-CM | POA: Diagnosis present

## 2020-11-10 DIAGNOSIS — F331 Major depressive disorder, recurrent, moderate: Secondary | ICD-10-CM

## 2020-11-10 LAB — COMPREHENSIVE METABOLIC PANEL
ALT: 22 U/L (ref 0–44)
AST: 39 U/L (ref 15–41)
Albumin: 4.2 g/dL (ref 3.5–5.0)
Alkaline Phosphatase: 74 U/L (ref 38–126)
Anion gap: 16 — ABNORMAL HIGH (ref 5–15)
BUN: 20 mg/dL (ref 6–20)
CO2: 22 mmol/L (ref 22–32)
Calcium: 9.5 mg/dL (ref 8.9–10.3)
Chloride: 100 mmol/L (ref 98–111)
Creatinine, Ser: 1.3 mg/dL — ABNORMAL HIGH (ref 0.44–1.00)
GFR, Estimated: 51 mL/min — ABNORMAL LOW (ref >60)
Glucose, Bld: 103 mg/dL — ABNORMAL HIGH (ref 70–99)
Potassium: 4.5 mmol/L (ref 3.5–5.1)
Sodium: 138 mmol/L (ref 135–145)
Total Bilirubin: 0.8 mg/dL (ref 0.3–1.2)
Total Protein: 8.1 g/dL (ref 6.5–8.1)

## 2020-11-10 LAB — CBG MONITORING, ED: Glucose-Capillary: 97 mg/dL (ref 70–99)

## 2020-11-10 LAB — DIFFERENTIAL
Abs Immature Granulocytes: 0.07 10*3/uL (ref 0.00–0.07)
Basophils Absolute: 0 10*3/uL (ref 0.0–0.1)
Basophils Relative: 0 %
Eosinophils Absolute: 0 10*3/uL (ref 0.0–0.5)
Eosinophils Relative: 0 %
Immature Granulocytes: 1 %
Lymphocytes Relative: 15 %
Lymphs Abs: 2.1 10*3/uL (ref 0.7–4.0)
Monocytes Absolute: 1 10*3/uL (ref 0.1–1.0)
Monocytes Relative: 7 %
Neutro Abs: 10.6 10*3/uL — ABNORMAL HIGH (ref 1.7–7.7)
Neutrophils Relative %: 77 %

## 2020-11-10 LAB — CBC
HCT: 49.1 % — ABNORMAL HIGH (ref 36.0–46.0)
Hemoglobin: 15.3 g/dL — ABNORMAL HIGH (ref 12.0–15.0)
MCH: 29.8 pg (ref 26.0–34.0)
MCHC: 31.2 g/dL (ref 30.0–36.0)
MCV: 95.5 fL (ref 80.0–100.0)
Platelets: 259 10*3/uL (ref 150–400)
RBC: 5.14 MIL/uL — ABNORMAL HIGH (ref 3.87–5.11)
RDW: 13.2 % (ref 11.5–15.5)
WBC: 13.8 10*3/uL — ABNORMAL HIGH (ref 4.0–10.5)
nRBC: 0 % (ref 0.0–0.2)

## 2020-11-10 LAB — I-STAT CHEM 8, ED
BUN: 23 mg/dL — ABNORMAL HIGH (ref 6–20)
Calcium, Ion: 1.15 mmol/L (ref 1.15–1.40)
Chloride: 104 mmol/L (ref 98–111)
Creatinine, Ser: 1.2 mg/dL — ABNORMAL HIGH (ref 0.44–1.00)
Glucose, Bld: 101 mg/dL — ABNORMAL HIGH (ref 70–99)
HCT: 49 % — ABNORMAL HIGH (ref 36.0–46.0)
Hemoglobin: 16.7 g/dL — ABNORMAL HIGH (ref 12.0–15.0)
Potassium: 4.5 mmol/L (ref 3.5–5.1)
Sodium: 140 mmol/L (ref 135–145)
TCO2: 26 mmol/L (ref 22–32)

## 2020-11-10 LAB — I-STAT BETA HCG BLOOD, ED (MC, WL, AP ONLY): I-stat hCG, quantitative: <5 m[IU]/mL (ref <5)

## 2020-11-10 LAB — PROTIME-INR
INR: 1 (ref 0.8–1.2)
Prothrombin Time: 12.8 seconds (ref 11.4–15.2)

## 2020-11-10 LAB — APTT: aPTT: 30 seconds (ref 24–36)

## 2020-11-10 IMAGING — CT CT ANGIO NECK
2 of 7 series · 8 of 33 positions shown · IV contrast (APPLIED)
Comparison: Noncontrast head CT, CT angiogram head/neck and CT
perfusion [DATE].

CLINICAL DATA: Stroke code. Focal neuro deficit, greater than 6
hours, stroke suspected. Aphasia, slurred speech, blurry vision.

EXAM:
CT ANGIOGRAPHY HEAD AND NECK
TECHNIQUE: Multidetector CT imaging of the head and neck was performed using
the standard protocol during bolus administration of intravenous
contrast. Multiplanar CT image reconstructions and MIPs were
obtained to evaluate the vascular anatomy. Carotid stenosis
measurements (when applicable) are obtained utilizing NASCET
criteria, using the distal internal carotid diameter as the
denominator.
CONTRAST:  Administered contrast not known at this time.

[Series 6: cta neck/head · axial · 0.51mm/px · z∈[+1355,+1463]mm · 2 of 164 slices shown]
[im 55/164  soft-tissue]
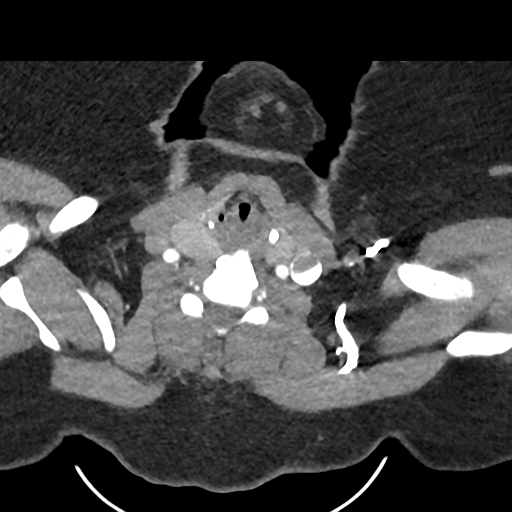
[im 109/164  soft-tissue]
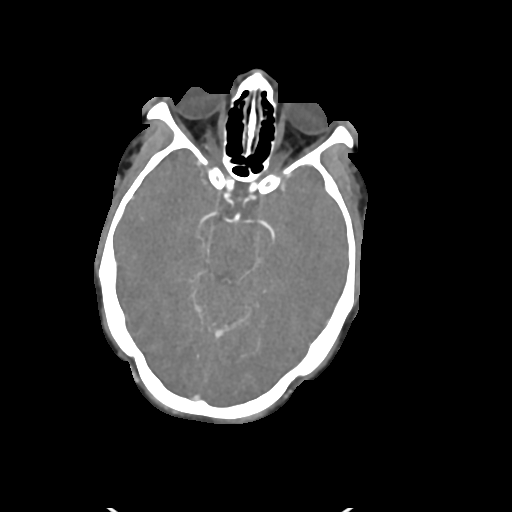

[Series 8: ax thins · axial · 0.39mm/px · z∈[+1294,+1529]mm · 6 of 329 slices shown]
[im 47/329  soft-tissue]
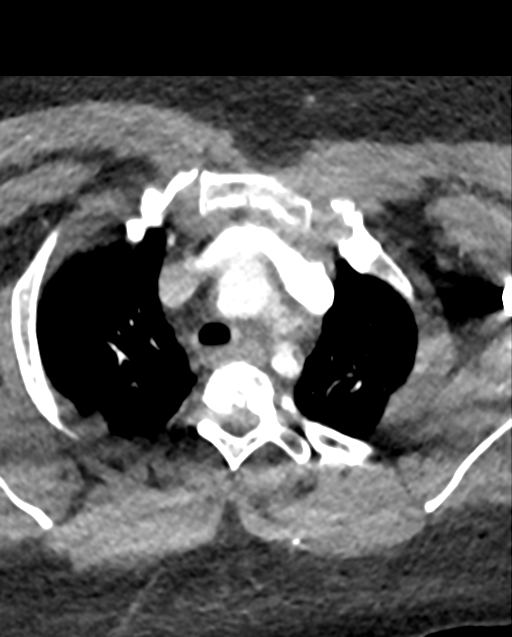
[im 94/329  bone]
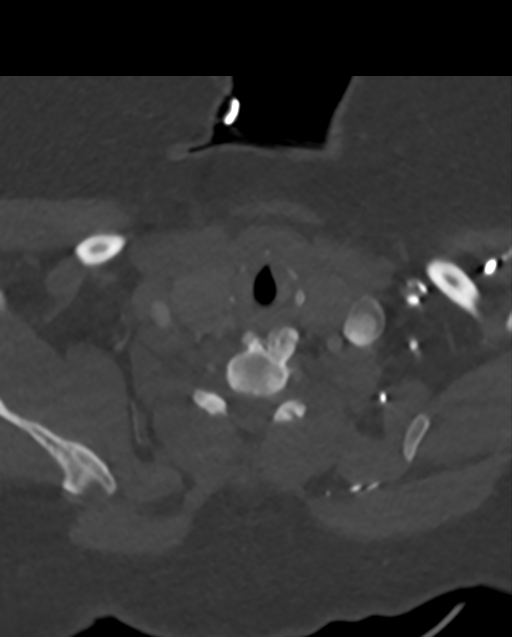
[im 141/329  soft-tissue]
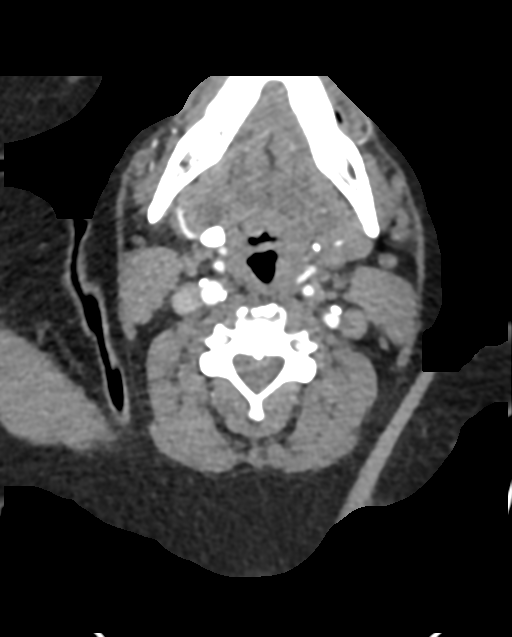
[im 188/329  bone]
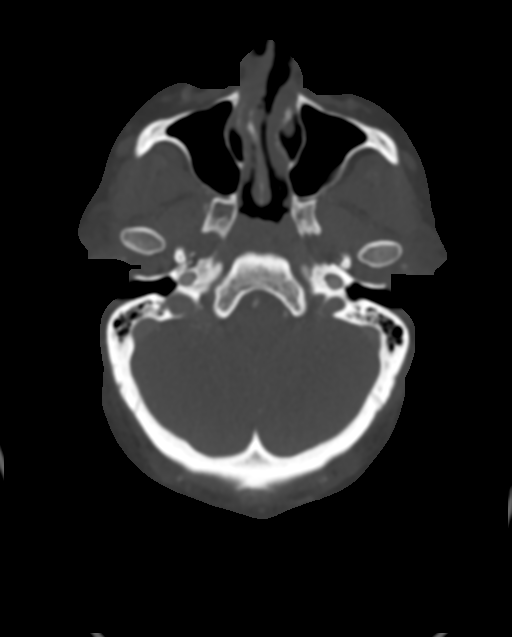
[im 235/329  soft-tissue]
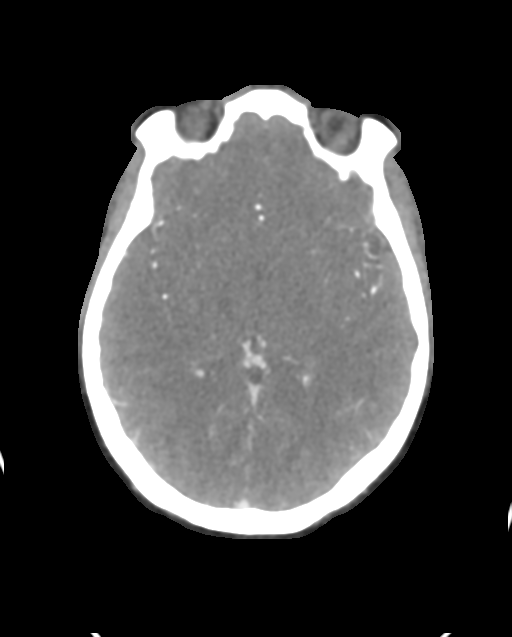
[im 282/329  bone]
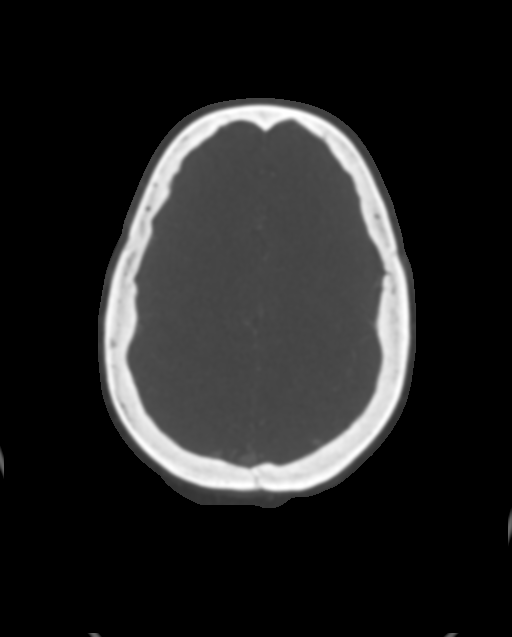

[8 of 33 positions shown; findings below may reference images not displayed]

FINDINGS: CTA NECK FINDINGS

Aortic arch: Common origin of the innominate and left common carotid
arteries. Minimal calcified plaque within the visualized aortic
arch. Streak artifact from a dense left-sided contrast bolus
partially obscures the left subclavian artery. Within this
limitation, there is no appreciable hemodynamically significant
innominate or proximal subclavian artery stenosis.

Right carotid system: CCA and ICA patent within the neck without
significant stenosis (50% or greater). Mild calcified plaque within
the proximal ICA.

Left carotid system: CCA and ICA patent within the neck without
significant stenosis (50% or greater). Mild calcified plaque within
the proximal ICA.

Vertebral arteries: Patent within the neck bilaterally. Moderate
stenosis within the proximal V1 right vertebral artery.

Skeleton: No acute bony abnormality or aggressive osseous lesion.
Cervical spondylosis with prominent multilevel ventral osteophytes.

Other neck: No neck mass or cervical lymphatic

Upper chest: 6 mm nodule within the left upper lobe (series 8, image
323).

Review of the MIP images confirms the above findings

CTA HEAD FINDINGS

Anterior circulation:

The intracranial internal carotid arteries are patent. Calcified
plaque within both vessels with no more than mild stenosis. The M1
middle cerebral arteries are patent. No M2 proximal branch occlusion
or high-grade proximal stenosis is identified. The anterior cerebral
arteries are patent. No intracranial aneurysm is identified.

Posterior circulation:

The intracranial vertebral arteries are patent. The basilar artery
is patent. The posterior cerebral arteries are patent. Posterior
communicating arteries are present bilaterally.

Venous sinuses: Within the limitations of contrast timing, no
convincing thrombus.

Anatomic variants: As described

Review of the MIP images confirms the above findings

These results were communicated to Dr. EVACLEAN At [DATE] pmon
[DATE]by text page via the [employer] messaging system.
IMPRESSION: CTA neck:

1. The common and internal carotid arteries are patent within the
neck. Mild calcified plaque within the proximal ICAs bilaterally.
2. Vertebral arteries patent within the neck. Moderate stenosis
within the proximal V1 right vertebral artery.
3. 6 mm left upper lobe pulmonary nodule. Non-contrast chest CT at
6-12 months is recommended. If the nodule is stable at time of
repeat CT, then future CT at 18-24 months (from today's scan) is
considered optional for low-risk patients, but is recommended for
high-risk patients. This recommendation follows the consensus
statement: Guidelines for Management of Incidental Pulmonary Nodules
Detected on CT Images: From the [HOSPITAL] [W2]; Radiology
[W2]; [DATE].

CTA head:

1. No intracranial large vessel occlusion or proximal high-grade
arterial stenosis.
2. Calcified plaque within the intracranial internal carotid
arteries with no more than mild stenosis.

## 2020-11-10 IMAGING — CT CT HEAD CODE STROKE
4 series · 16 of 47 positions shown, 18 images · non-contrast
Comparison: Brain MRI [DATE]. CT angiogram head/neck
[DATE], head CT [DATE].

CLINICAL DATA: Code stroke. Neuro deficit, acute, stroke suspected.
Stroke, aphasia, slurred speech, blurry vision, last known well [KJ]

EXAM:
CT HEAD WITHOUT CONTRAST
TECHNIQUE: Contiguous axial images were obtained from the base of the skull
through the vertex without intravenous contrast.

[Series 3: head wo · axial · 0.40mm/px · z∈[+1374,+1494]mm · 7 of 34 slices shown, 9 images]
[im 5/34  brain]
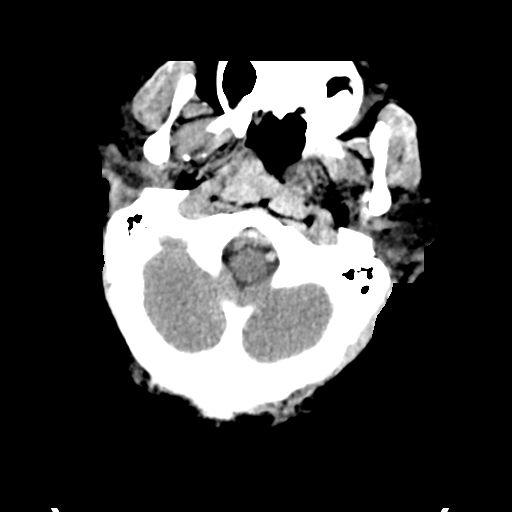
[im 5/34  bone]
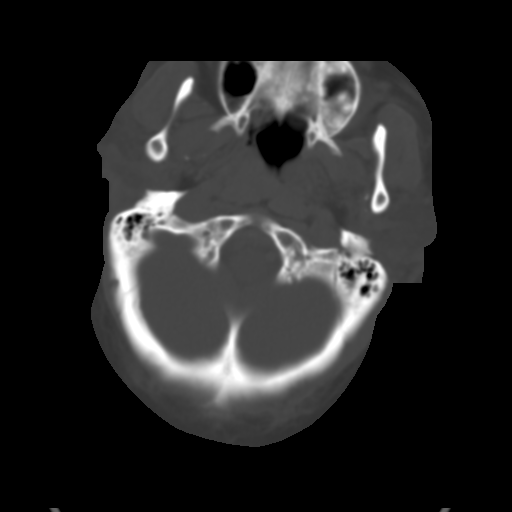
[im 9/34  brain]
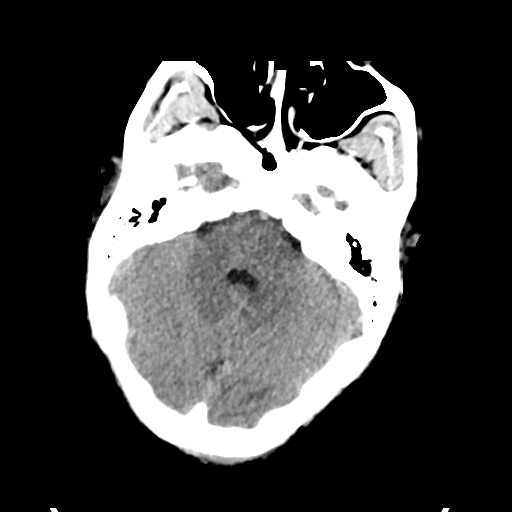
[im 13/34  brain]
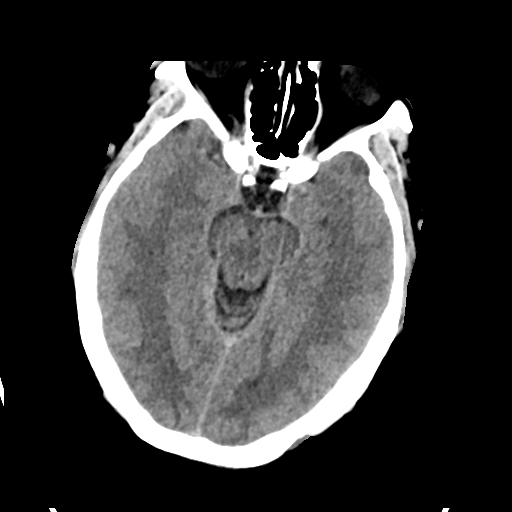
[im 17/34  brain]
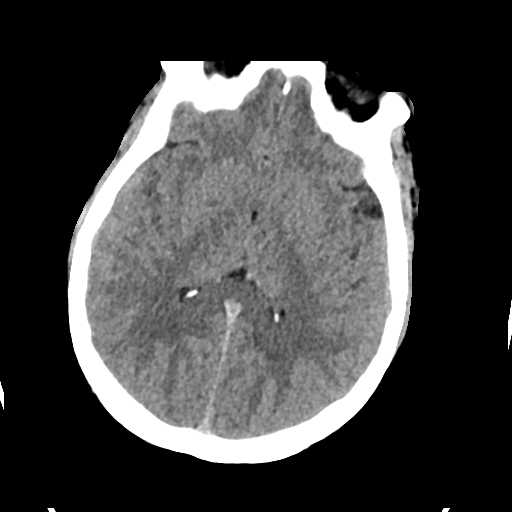
[im 21/34  brain]
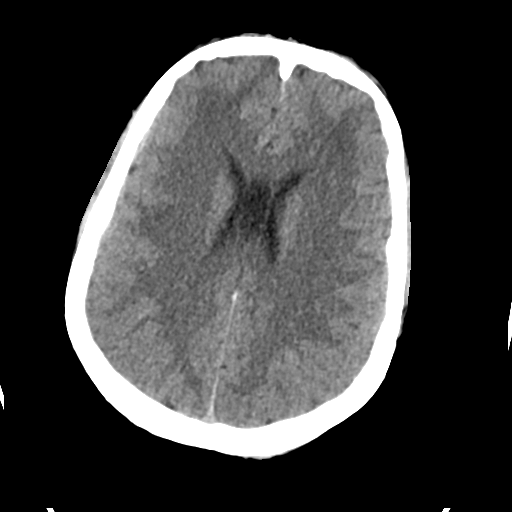
[im 21/34  bone]
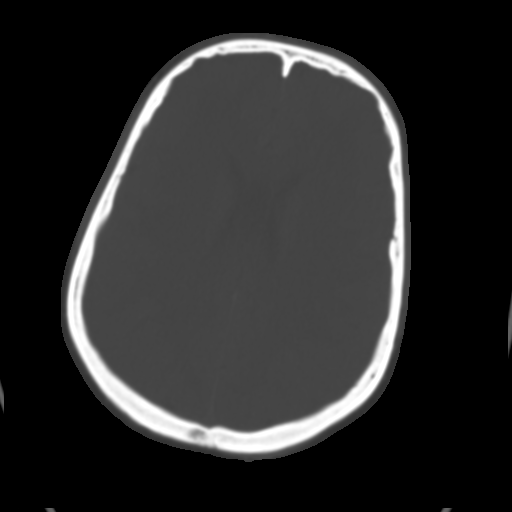
[im 25/34  brain]
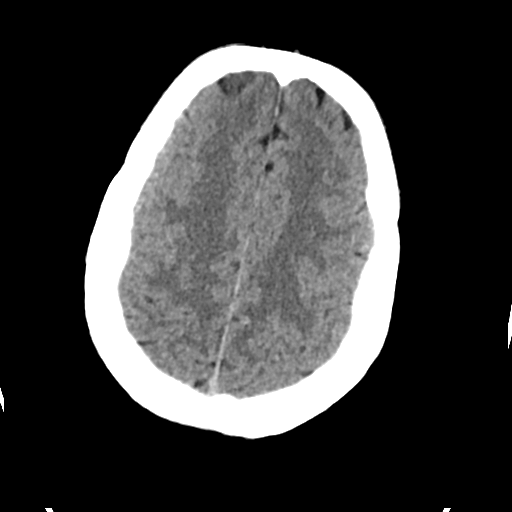
[im 29/34  brain]
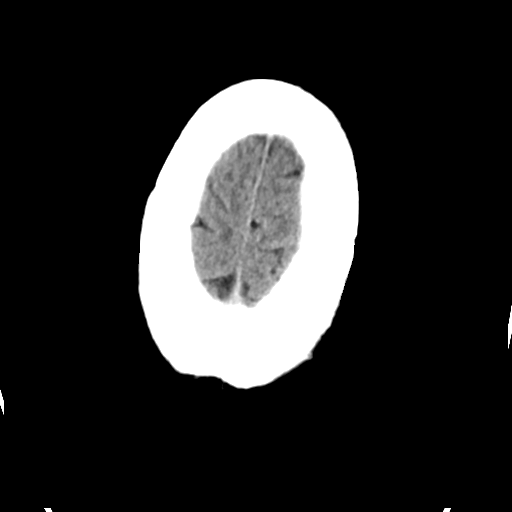

[Series 4: head bone · axial · 0.40mm/px · z∈[+1370,+1404]mm · 3 of 85 slices shown]
[im 9/85  bone]
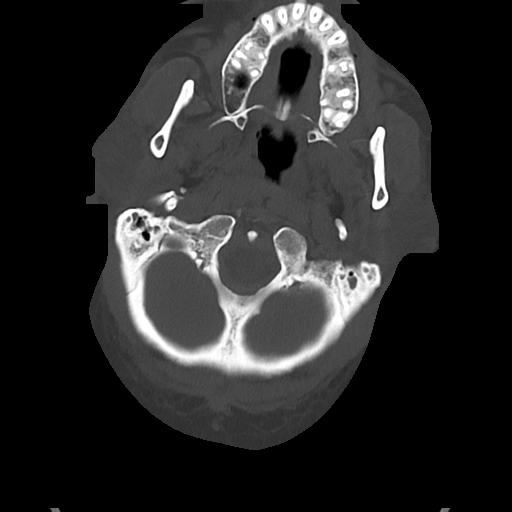
[im 17/85  bone]
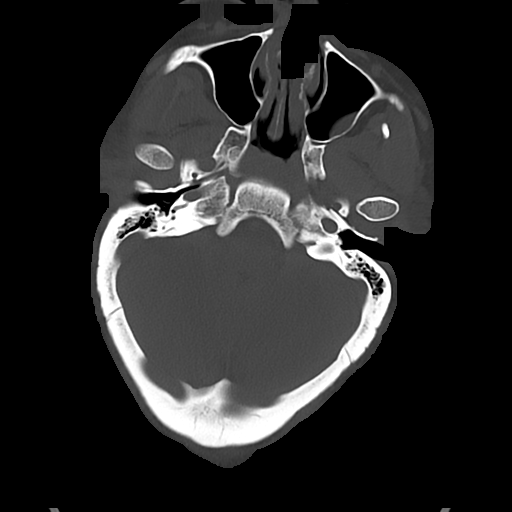
[im 26/85  bone]
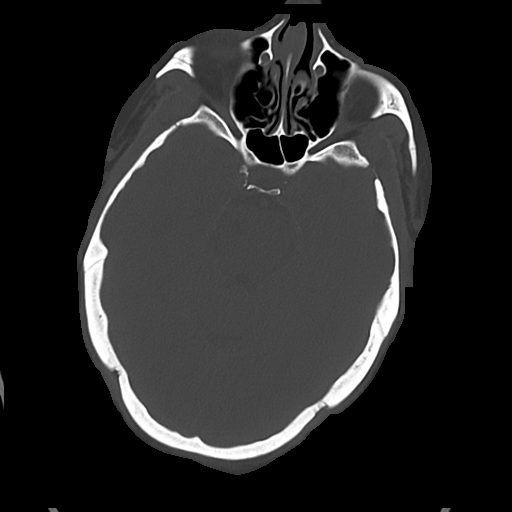

[Series 5: cor soft · coronal · 0.36mm/px · 3 of 69 slices shown]
[im 23/69  brain]
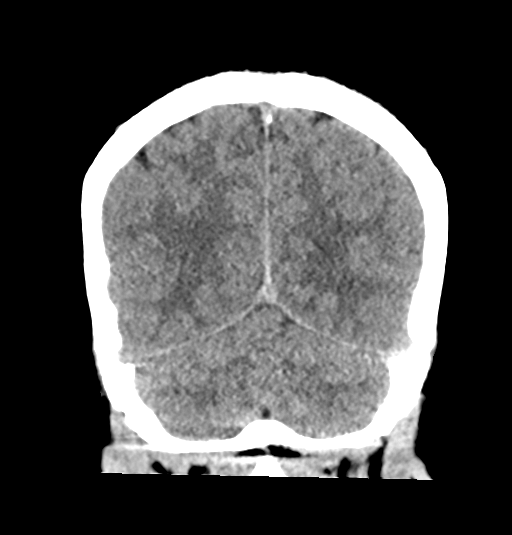
[im 31/69  brain]
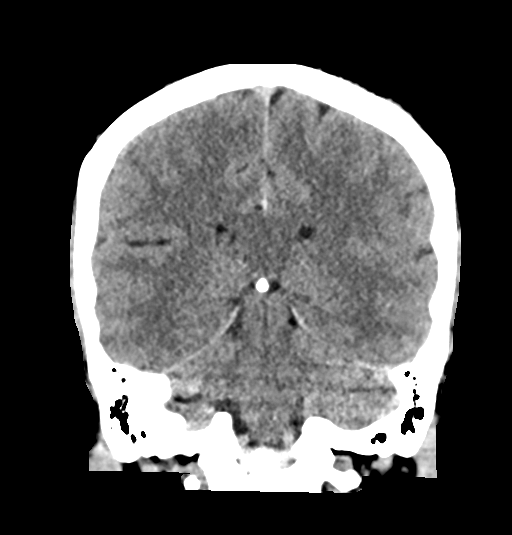
[im 38/69  brain]
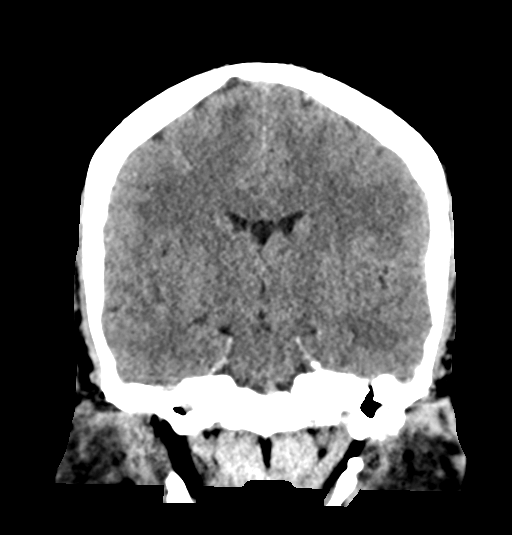

[Series 6: sag soft · sagittal · 0.38mm/px · 3 of 58 slices shown]
[im 20/58  brain]
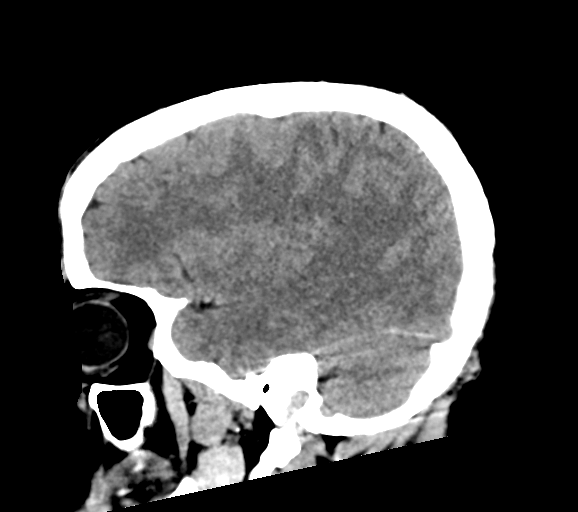
[im 29/58  brain]
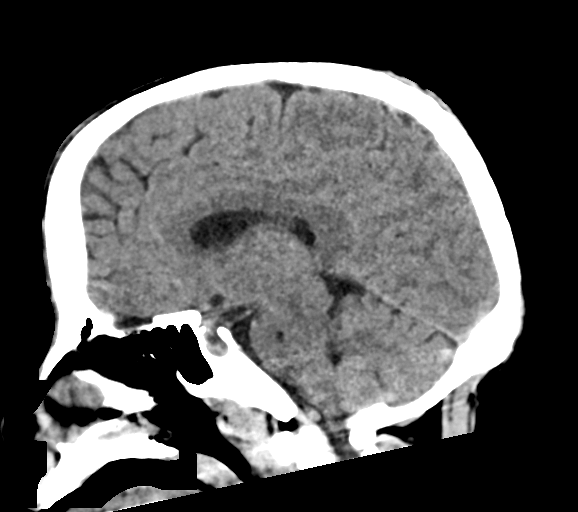
[im 39/58  brain]
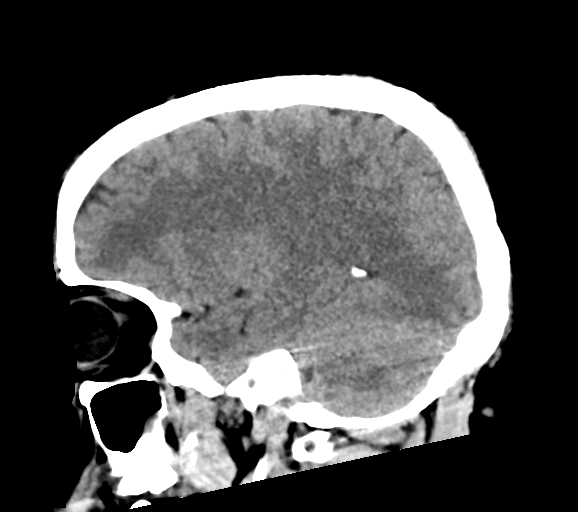

[16 of 47 positions shown; findings below may reference images not displayed]

FINDINGS: Brain:

Cerebral volume is normal.

A known subacute infarct within the posterior left midbrain was
better appreciated on the brain MRI of [DATE] (acute at that
time).

Redemonstrated subacute infarct within the ventral right thalamus.

Redemonstrated chronic lacunar infarct within the right pons.

Minimal ill-defined hypoattenuation within the cerebral white matter
is nonspecific, but compatible with chronic small vessel ischemic
disease.

There is no acute intracranial hemorrhage.

No demarcated cortical infarct.

No extra-axial fluid collection.

No evidence of intracranial mass.

No midline shift.

Vascular: No hyperdense vessel.

Skull: Normal. Negative for fracture or focal lesion.

Sinuses/Orbits: Visualized orbits show no acute finding. Small left
maxillary sinus mucous retention cysts.

ASPECTS (Alberta Stroke Program Early CT Score)

- Ganglionic level infarction (caudate, lentiform nuclei, internal
capsule, insula, M1-M3 cortex): 7

- Supraganglionic infarction (M4-M6 cortex): 3

Total score (0-10 with 10 being normal): 10

These results were called by telephone at the time of interpretation
on [DATE] at [DATE] to provider Dr. TIGER, who verbally
acknowledged these results.
IMPRESSION: A known small subacute infarct within the posterior left midbrain
was better appreciated on the prior MRI of [DATE].

Redemonstrated small subacute infarct within the ventral right
thalamus.

No CT evidence of interval acute intracranial abnormality.

Redemonstrated chronic infarct within the right pons.

Stable minimal cerebral white matter chronic small vessel ischemic
disease.

## 2020-11-10 MED ORDER — SODIUM CHLORIDE 0.9% FLUSH
3.0000 mL | Freq: Once | INTRAVENOUS | Status: AC
  Administered 2020-11-10: 3 mL via INTRAVENOUS

## 2020-11-10 MED ORDER — IOHEXOL 350 MG/ML SOLN
75.0000 mL | Freq: Once | INTRAVENOUS | Status: AC | PRN
  Administered 2020-11-10: 75 mL via INTRAVENOUS

## 2020-11-10 NOTE — ED Notes (Signed)
Returned from MRI   Unable to obtain MRI d/t patient not being able to handle secretions.

## 2020-11-10 NOTE — ED Notes (Signed)
Patient taken to MRI

## 2020-11-10 NOTE — ED Provider Notes (Signed)
[name] EMERGENCY DEPARTMENT Provider Note   CSN: [id number] Arrival date & time: 11/10/20  1951     History Chief Complaint  Patient presents with  . Code Stroke    Cathy Hall is a 50 y.o. female.  50 y.o female with a PMH of COPD, ischemic stroke, cocaine use, presents to the ED with a chief complaint of worsening weakness.  Patient was recently discharged from the hospital this morning, arrived at home, began to experience worsening weakness.  Reports these episodes occur while she was sitting and suddenly.  Patient was not a candidate for TPA during her prior hospital stay. Level 5 caveat.  The history is provided by medical records.       Past Medical History:  Diagnosis Date  . Anxiety   . Asthma   . COPD (chronic obstructive pulmonary disease) (HCC)   . Depression   . Hypertension   . Major depression   . PTSD (post-traumatic stress disorder)     Patient Active Problem List   Diagnosis Date Noted  . Acute CVA (cerebrovascular accident) (HCC) 11/10/2020  . Ischemic stroke (HCC) 11/06/2020  . Epigastric pain 11/05/2020  . Palpitations 11/05/2020  . Essential hypertension 11/05/2020  . Cocaine use disorder, moderate, dependence (HCC) 08/09/2020  . Cannabis use disorder, moderate, dependence (HCC) 08/09/2020  . Severe episode of recurrent major depressive disorder, without psychotic features [facility])     Past Surgical History:  Procedure Laterality Date  . ECTOPIC PREGNANCY SURGERY       OB History   No obstetric history on file.     Family History  Problem Relation Age of Onset  . Cerebral aneurysm Mother   . Diabetes Sister   . Other Neg Hx     Social History   Tobacco Use  . Smoking status: Current Every Day Smoker    Types: Cigarettes  . Smokeless tobacco: Never Used  Vaping Use  . Vaping Use: Never used  Substance Use Topics  . Alcohol use: Yes    Comment: occasional  . Drug use: Yes    Types:  Cocaine, Marijuana    Home Medications Prior to Admission medications   Medication Sig Start Date End Date Taking? Authorizing Provider  albuterol (VENTOLIN HFA) 108 (90 Base) MCG/ACT inhaler Inhale 2 puffs into the lungs every 6 (six) hours as needed for wheezing or shortness of breath.    [provider]  amLODipine (NORVASC) 5 MG tablet Take 1 tablet (5 mg total) by mouth daily. 11/09/20 02/07/21  [place], Alvira Philips, DO  aspirin EC 81 MG EC tablet Take 1 tablet (81 mg total) by mouth daily. Swallow whole. 11/09/20 02/07/21  [place], Alvira Philips, DO  atorvastatin (LIPITOR) 40 MG tablet Take 1 tablet (40 mg total) by mouth daily. 11/09/20 02/07/21  [place], Eric J, DO  clopidogrel (PLAVIX) 75 MG tablet Take 1 tablet (75 mg total) by mouth daily for 21 days. 11/09/20 11/30/20  [place], Eric J, DO  diclofenac Sodium (VOLTAREN) 1 % GEL Apply 1 application topically 2 (two) times daily as needed (pain).    [provider]  FLUoxetine (PROZAC) 10 MG capsule Take 1 capsule (10 mg total) by mouth daily. 08/09/20   Zena Amos, MD  gabapentin (NEURONTIN) 100 MG capsule Take 1 capsule (100 mg total) by mouth 2 (two) times daily. 08/09/20   Zena Amos, MD  hydrOXYzine (ATARAX/VISTARIL) 25 MG tablet Take 1 tablet (25 mg total) by mouth 3 (three) times daily as  needed for anxiety. 08/09/20   [place] Crane, MD  Multiple Vitamin (MULTIVITAMIN WITH MINERALS) TABS tablet Take 1 tablet by mouth daily.    [provider]  OLANZapine (ZYPREXA) 5 MG tablet Take 1 tablet (5 mg total) by mouth at bedtime. 08/09/20 08/09/21  [place] Crane, MD  pantoprazole (PROTONIX) 40 MG tablet Take 1 tablet (40 mg total) by mouth daily. 11/09/20 02/07/21  [place] Eric J, DO  polyvinyl alcohol (ARTIFICIAL TEARS) 1.4 % ophthalmic solution Place 1 drop into both eyes daily as needed for dry eyes.    [provider]    Allergies    Nsaids  Review of Systems   Review of Systems  Unable to perform ROS: Patient nonverbal     Physical Exam Updated Vital Signs BP (!) 183/105   Pulse 94   Temp 98.2 F (36.8 C) (Oral)   Resp 19   SpO2 96%   Physical Exam Vitals and nursing note reviewed.  HENT:     Head: Normocephalic and atraumatic.     Mouth/Throat:     Mouth: Mucous membranes are moist.  Pulmonary:     Effort: Pulmonary effort is normal.     Breath sounds: No wheezing or rales.  Abdominal:     General: Abdomen is flat.  Musculoskeletal:     Cervical back: Normal range of motion and neck supple.  Skin:    General: Skin is warm and dry.  Neurological:     Mental Status: She is alert.     Cranial Nerves: Dysarthria and facial asymmetry present.     Coordination: Finger-Nose-Finger Test abnormal.     Comments: Significant dysarthria.  RUE > LUE       ED Results / Procedures / Treatments   Labs (all labs ordered are listed, but only abnormal results are displayed) Labs Reviewed  CBC - Abnormal; Notable for the following components:      Result Value   WBC 13.8 (*)    RBC 5.14 (*)    Hemoglobin 15.3 (*)    HCT 49.1 (*)    All other components within normal limits  DIFFERENTIAL - Abnormal; Notable for the following components:   Neutro Abs 10.6 (*)    All other components within normal limits  COMPREHENSIVE METABOLIC PANEL - Abnormal; Notable for the following components:   Glucose, Bld 103 (*)    Creatinine, Ser 1.30 (*)    GFR, Estimated 51 (*)    Anion gap 16 (*)    All other components within normal limits  I-STAT CHEM 8, ED - Abnormal; Notable for the following components:   BUN 23 (*)    Creatinine, Ser 1.20 (*)    Glucose, Bld 101 (*)    Hemoglobin 16.7 (*)    HCT 49.0 (*)    All other components within normal limits  PROTIME-INR  APTT  CBG MONITORING, ED  I-STAT BETA HCG BLOOD, ED ([facility], [facility], [facility] ONLY)    EKG None  Radiology CT Code Stroke CTA Neck W/WO contrast  Result Date: 11/10/2020 CLINICAL DATA:  Stroke code. Focal neuro deficit, greater than 6 hours, stroke  suspected. Aphasia, slurred speech, blurry vision. EXAM: CT ANGIOGRAPHY HEAD AND NECK TECHNIQUE: Multidetector CT imaging of the head and neck was performed using the standard protocol during bolus administration of intravenous contrast. Multiplanar CT image reconstructions and MIPs were obtained to evaluate the vascular anatomy. Carotid stenosis measurements (when applicable) are obtained utilizing NASCET criteria, using the distal internal carotid diameter as the  denominator. CONTRAST:  Administered contrast not known at this time. COMPARISON:  Noncontrast head CT, CT angiogram head/neck and CT perfusion 11/05/2020. FINDINGS: CTA NECK FINDINGS Aortic arch: Common origin of the innominate and left common carotid arteries. Minimal calcified plaque within the visualized aortic arch. Streak artifact from a dense left-sided contrast bolus partially obscures the left subclavian artery. Within this limitation, there is no appreciable hemodynamically significant innominate or proximal subclavian artery stenosis. Right carotid system: CCA and ICA patent within the neck without significant stenosis (50% or greater). Mild calcified plaque within the proximal ICA. Left carotid system: CCA and ICA patent within the neck without significant stenosis (50% or greater). Mild calcified plaque within the proximal ICA. Vertebral arteries: Patent within the neck bilaterally. Moderate stenosis within the proximal V1 right vertebral artery. Skeleton: No acute bony abnormality or aggressive osseous lesion. Cervical spondylosis with prominent multilevel ventral osteophytes. Other neck: No neck mass or cervical lymphatic Upper chest: 6 mm nodule within the left upper lobe (series 8, image 323). Review of the MIP images confirms the above findings CTA HEAD FINDINGS Anterior circulation: The intracranial internal carotid arteries are patent. Calcified plaque within both vessels with no more than mild stenosis. The M1 middle cerebral arteries  are patent. No M2 proximal branch occlusion or high-grade proximal stenosis is identified. The anterior cerebral arteries are patent. No intracranial aneurysm is identified. Posterior circulation: The intracranial vertebral arteries are patent. The basilar artery is patent. The posterior cerebral arteries are patent. Posterior communicating arteries are present bilaterally. Venous sinuses: Within the limitations of contrast timing, no convincing thrombus. Anatomic variants: As described Review of the MIP images confirms the above findings These results were communicated to Dr. Amada Jupiter At 8:37 pmon 1/6/2022by text page via the [facility] messaging system. IMPRESSION: CTA neck: 1. The common and internal carotid arteries are patent within the neck. Mild calcified plaque within the proximal ICAs bilaterally. 2. Vertebral arteries patent within the neck. Moderate stenosis within the proximal V1 right vertebral artery. 3. 6 mm left upper lobe pulmonary nodule. Non-contrast chest CT at 6-12 months is recommended. If the nodule is stable at time of repeat CT, then future CT at 18-24 months (from today's scan) is considered optional for low-risk patients, but is recommended for high-risk patients. This recommendation follows the consensus statement: Guidelines for Management of Incidental Pulmonary Nodules Detected on CT Images: From the Fleischner Society 2017; Radiology 2017; 284:228-243. CTA head: 1. No intracranial large vessel occlusion or proximal high-grade arterial stenosis. 2. Calcified plaque within the intracranial internal carotid arteries with no more than mild stenosis. Electronically Signed   By: Jackey Loge DO   On: 11/10/2020 20:38   CT HEAD CODE STROKE WO CONTRAST  Result Date: 11/10/2020 CLINICAL DATA:  Code stroke. Neuro deficit, acute, stroke suspected. Stroke, aphasia, slurred speech, blurry vision, last known well 1900 EXAM: CT HEAD WITHOUT CONTRAST TECHNIQUE: Contiguous axial images were obtained  from the base of the skull through the vertex without intravenous contrast. COMPARISON:  Brain MRI 11/05/2020. CT angiogram head/neck 11/05/2020, head CT 11/05/2020. FINDINGS: Brain: Cerebral volume is normal. A known subacute infarct within the posterior left midbrain was better appreciated on the brain MRI of 11/05/2020 (acute at that time). Redemonstrated subacute infarct within the ventral right thalamus. Redemonstrated chronic lacunar infarct within the right pons. Minimal ill-defined hypoattenuation within the cerebral white matter is nonspecific, but compatible with chronic small vessel ischemic disease. There is no acute intracranial hemorrhage. No demarcated cortical infarct. No extra-axial fluid  collection. No evidence of intracranial mass. No midline shift. Vascular: No hyperdense vessel. Skull: Normal. Negative for fracture or focal lesion. Sinuses/Orbits: Visualized orbits show no acute finding. Small left maxillary sinus mucous retention cysts. ASPECTS (Alberta Stroke Program Early CT Score) - Ganglionic level infarction (caudate, lentiform nuclei, internal capsule, insula, M1-M3 cortex): 7 - Supraganglionic infarction (M4-M6 cortex): 3 Total score (0-10 with 10 being normal): 10 These results were called by telephone at the time of interpretation on 11/10/2020 at 8:15 pm to provider Dr. Amada Jupiter, who verbally acknowledged these results. IMPRESSION: A known small subacute infarct within the posterior left midbrain was better appreciated on the prior MRI of 11/05/2020. Redemonstrated small subacute infarct within the ventral right thalamus. No CT evidence of interval acute intracranial abnormality. Redemonstrated chronic infarct within the right pons. Stable minimal cerebral white matter chronic small vessel ischemic disease. Electronically Signed   By: Jackey Loge DO   On: 11/10/2020 20:16    Procedures Procedures (including critical care time)  Medications Ordered in ED Medications  sodium  chloride flush (NS) 0.9 % injection 3 mL (has no administration in time range)  iohexol (OMNIPAQUE) 350 MG/ML injection 75 mL (75 mLs Intravenous Contrast Given 11/10/20 2023)    ED Course  I have reviewed the triage vital signs and the nursing notes.  Pertinent labs & imaging results that were available during my care of the patient were reviewed by me and considered in my medical decision making (see chart for details).    MDM Rules/Calculators/A&P   Patient with a history of cocaine abuse presents to the ED via EMS from home for worsening left-sided weakness. She was recently discharged from the hospital this morning for a prior ischemic stroke, she was not a candidate for TPA.  Patient returns as symptoms had worsened after she was at home. Prior admission for Acute infarct in the left posterior midbrain in the roof of the fourth ventricle  She was evaluated bridge, airway was cleared.  During primary evaluation for patient, there is significant dysarthria, unable to understand patient's complaints.  She is reporting worsening weakness to her left arm by pointing.  Also reports some lower back discomfort along with lower extremity discomfort.  Unable to obtain further history from patient at this time.  Interpretation of her labs on today's visit were reviewed by me, remarkable for a leukocytosis. PT.INR are at her baseline. Chem 8 was obtained, creatine function elevated from yesterday.   CTA neck:    1. The common and internal carotid arteries are patent within the  neck. Mild calcified plaque within the proximal ICAs bilaterally.  2. Vertebral arteries patent within the neck. Moderate stenosis  within the proximal V1 right vertebral artery.  3. 6 mm left upper lobe pulmonary nodule. Non-contrast chest CT at  6-12 months is recommended. If the nodule is stable at time of  repeat CT, then future CT at 18-24 months (from today's scan) is  considered optional for low-risk patients, but  is recommended for  high-risk patients. This recommendation follows the consensus  statement: Guidelines for Management of Incidental Pulmonary Nodules  Detected on CT Images: From the Fleischner Society 2017; Radiology  2017; 284:228-243.    CTA head:    1. No intracranial large vessel occlusion or proximal high-grade  arterial stenosis.  2. Calcified plaque within the intracranial internal carotid  arteries with no more than mild stenosis.     Patient was evaluated by Dr. Amada Jupiter from neurology who recommended swallow screening  along with MRI Brain repeat non-urgent.   9:35 PM Spoke to hospitalist service who will admit patient for further management.    Portions of this note were generated with Scientist, clinical (histocompatibility and immunogenetics). Dictation errors may occur despite best attempts at proofreading.  Final Clinical Impression(s) / ED Diagnoses Final diagnoses:  Left-sided weakness  Dysarthria    Rx / DC Orders ED Discharge Orders    None       Claude Manges, PA-C 11/10/20 2136    Rolan Bucco, MD 11/10/20 2253

## 2020-11-10 NOTE — Consult Note (Signed)
Neurology Consultation Reason for Consult: Increasing dysarthria Referring Physician: Ardeen Jourdain  CC: Increasing dysarthria  History is obtained from: Patient  HPI: Cathy Hall is a 50 y.o. female who was discharged earlier today from this hospital with two small infarcts presumed to be related to small vessel disease who developed worsening symptoms one time.  She was sitting at her house, when she had sudden worsening of her dysarthria.  Due to her severe dysarthria history is limited but she states that this is markedly different from when she left the hospital.  She was also not managing her secretions very well and needed frequent suctioning by EMS in route.   LKW: 7 PM tpa given?: no, recent stroke    ROS: A complete ROS is unable to obtain due to severe dysarthria, but she does deny pain or other symptoms.  Past Medical History:  Diagnosis Date  . Anxiety   . Asthma   . COPD (chronic obstructive pulmonary disease) (HCC)   . Depression   . Hypertension   . Major depression   . PTSD (post-traumatic stress disorder)      Family History  Problem Relation Age of Onset  . Cerebral aneurysm Mother   . Diabetes Sister   . Other Neg Hx      Social History:  reports that she has been smoking cigarettes. She has never used smokeless tobacco. She reports current alcohol use. She reports current drug use. Drugs: Cocaine and Marijuana.   Exam: Current vital signs: BP (!) 183/105   Pulse 94   Temp 98.2 F (36.8 C) (Oral)   Resp 19   SpO2 96%  Vital signs in last 24 hours: Temp:  [98.2 F (36.8 C)] 98.2 F (36.8 C) (01/06 2045) Pulse Rate:  [94] 94 (01/06 2045) Resp:  [17-19] 19 (01/06 2045) BP: (183-198)/(105-134) 183/105 (01/06 2045) SpO2:  [96 %] 96 % (01/06 2045)   Physical Exam  Constitutional: Appears well-developed and well-nourished.  Psych: Affect appropriate to situation Eyes: No scleral injection HENT: No OP obstrucion MSK: no joint  deformities.  Cardiovascular: Normal rate and regular rhythm.  Respiratory: Effort normal, non-labored breathing GI: Soft.  No distension. There is no tenderness.  Skin: WDI  Neuro: Mental Status: Patient is awake, alert, oriented to person, place, month, year, and situation. Patient is able to give a clear and coherent history. No signs of aphasia or neglect Cranial Nerves: II: Visual Fields are full. Pupils are equal, round, and reactive to light.   III,IV, VI: EOMI without ptosis or diploplia.  V: Facial sensation is symmetric to temperature VII: Facial movement is with left facial weakness VIII: hearing is intact to voice Motor: She has 4/5 weakness of the left arm and leg Sensory: Sensation is symmetric to light touch and temperature in the arms and legs. Cerebellar: FNF and HKS are slow bilaterally, but without clear past-pointing   I have reviewed labs in epic and the results pertinent to this consultation are: Creatinine 1.3  I have reviewed the images obtained: Previous MRI-small infarcts in the left posterior midbrain and thalamus, CT/CTA-no acute findings  Impression: 50 year old female who likely has extended her previous infarct.  I do think repeating her MRI may be beneficial to ensure that she has not suffered stroke to make [place] think of other etiologies.  Recommendations: 1) continue antiplatelet therapy with aspirin Plavix 2) evaluation by speech therapy as I suspect she will not be safe to swallow given the degree of dysarthria and difficulty  managing her secretions 3) repeat MRI brain 4) stroke team to follow-up   Ritta Slot, MD [facility] [phone]  If 7pm- 7am, please page neurology on call as listed in AMION.

## 2020-11-10 NOTE — ED Notes (Signed)
Patient arrived at 45--  Code stroke  Patient was brought in [facility] d/t worsening stroke symptoms. Patient had been DCed for a stroke. Patient presents with L facial droop and L sided weakness.  Patient also unable to handle secretions.   Fail swallow eval.   Patient denies pain, A0x4.

## 2020-11-10 NOTE — ED Notes (Signed)
Mearl Latin, pt daughter, called for update. Please call [phone].

## 2020-11-11 ENCOUNTER — Inpatient Hospital Stay (HOSPITAL_COMMUNITY): Payer: Self-pay

## 2020-11-11 ENCOUNTER — Encounter (HOSPITAL_COMMUNITY): Payer: Self-pay | Admitting: Internal Medicine

## 2020-11-11 DIAGNOSIS — N179 Acute kidney failure, unspecified: Secondary | ICD-10-CM | POA: Diagnosis present

## 2020-11-11 DIAGNOSIS — I10 Essential (primary) hypertension: Secondary | ICD-10-CM

## 2020-11-11 LAB — CBG MONITORING, ED
Glucose-Capillary: 99 mg/dL (ref 70–99)
Glucose-Capillary: 94 mg/dL (ref 70–99)
Glucose-Capillary: 105 mg/dL — ABNORMAL HIGH (ref 70–99)

## 2020-11-11 LAB — SARS CORONAVIRUS 2 (TAT 6-24 HRS): SARS Coronavirus 2: NEGATIVE

## 2020-11-11 LAB — RAPID URINE DRUG SCREEN, HOSP PERFORMED
Amphetamines: NOT DETECTED
Barbiturates: NOT DETECTED
Benzodiazepines: NOT DETECTED
Cocaine: POSITIVE — AB
Opiates: NOT DETECTED
Tetrahydrocannabinol: POSITIVE — AB

## 2020-11-11 LAB — CREATININE, SERUM
Creatinine, Ser: 1.03 mg/dL — ABNORMAL HIGH (ref 0.44–1.00)
GFR, Estimated: >60 mL/min (ref >60)

## 2020-11-11 LAB — CBC
HCT: 45.6 % (ref 36.0–46.0)
Hemoglobin: 14.4 g/dL (ref 12.0–15.0)
MCH: 29.9 pg (ref 26.0–34.0)
MCHC: 31.6 g/dL (ref 30.0–36.0)
MCV: 94.6 fL (ref 80.0–100.0)
Platelets: 247 10*3/uL (ref 150–400)
RBC: 4.82 MIL/uL (ref 3.87–5.11)
RDW: 13.2 % (ref 11.5–15.5)
WBC: 12.6 10*3/uL — ABNORMAL HIGH (ref 4.0–10.5)
nRBC: 0 % (ref 0.0–0.2)

## 2020-11-11 LAB — GLUCOSE, CAPILLARY
Glucose-Capillary: 116 mg/dL — ABNORMAL HIGH (ref 70–99)
Glucose-Capillary: 135 mg/dL — ABNORMAL HIGH (ref 70–99)

## 2020-11-11 IMAGING — DX DG CHEST 1V PORT
1 series · 1 of 1 positions shown · non-contrast
Comparison: None.

CLINICAL DATA: Stroke

EXAM:
PORTABLE CHEST 1 VIEW

[chest]
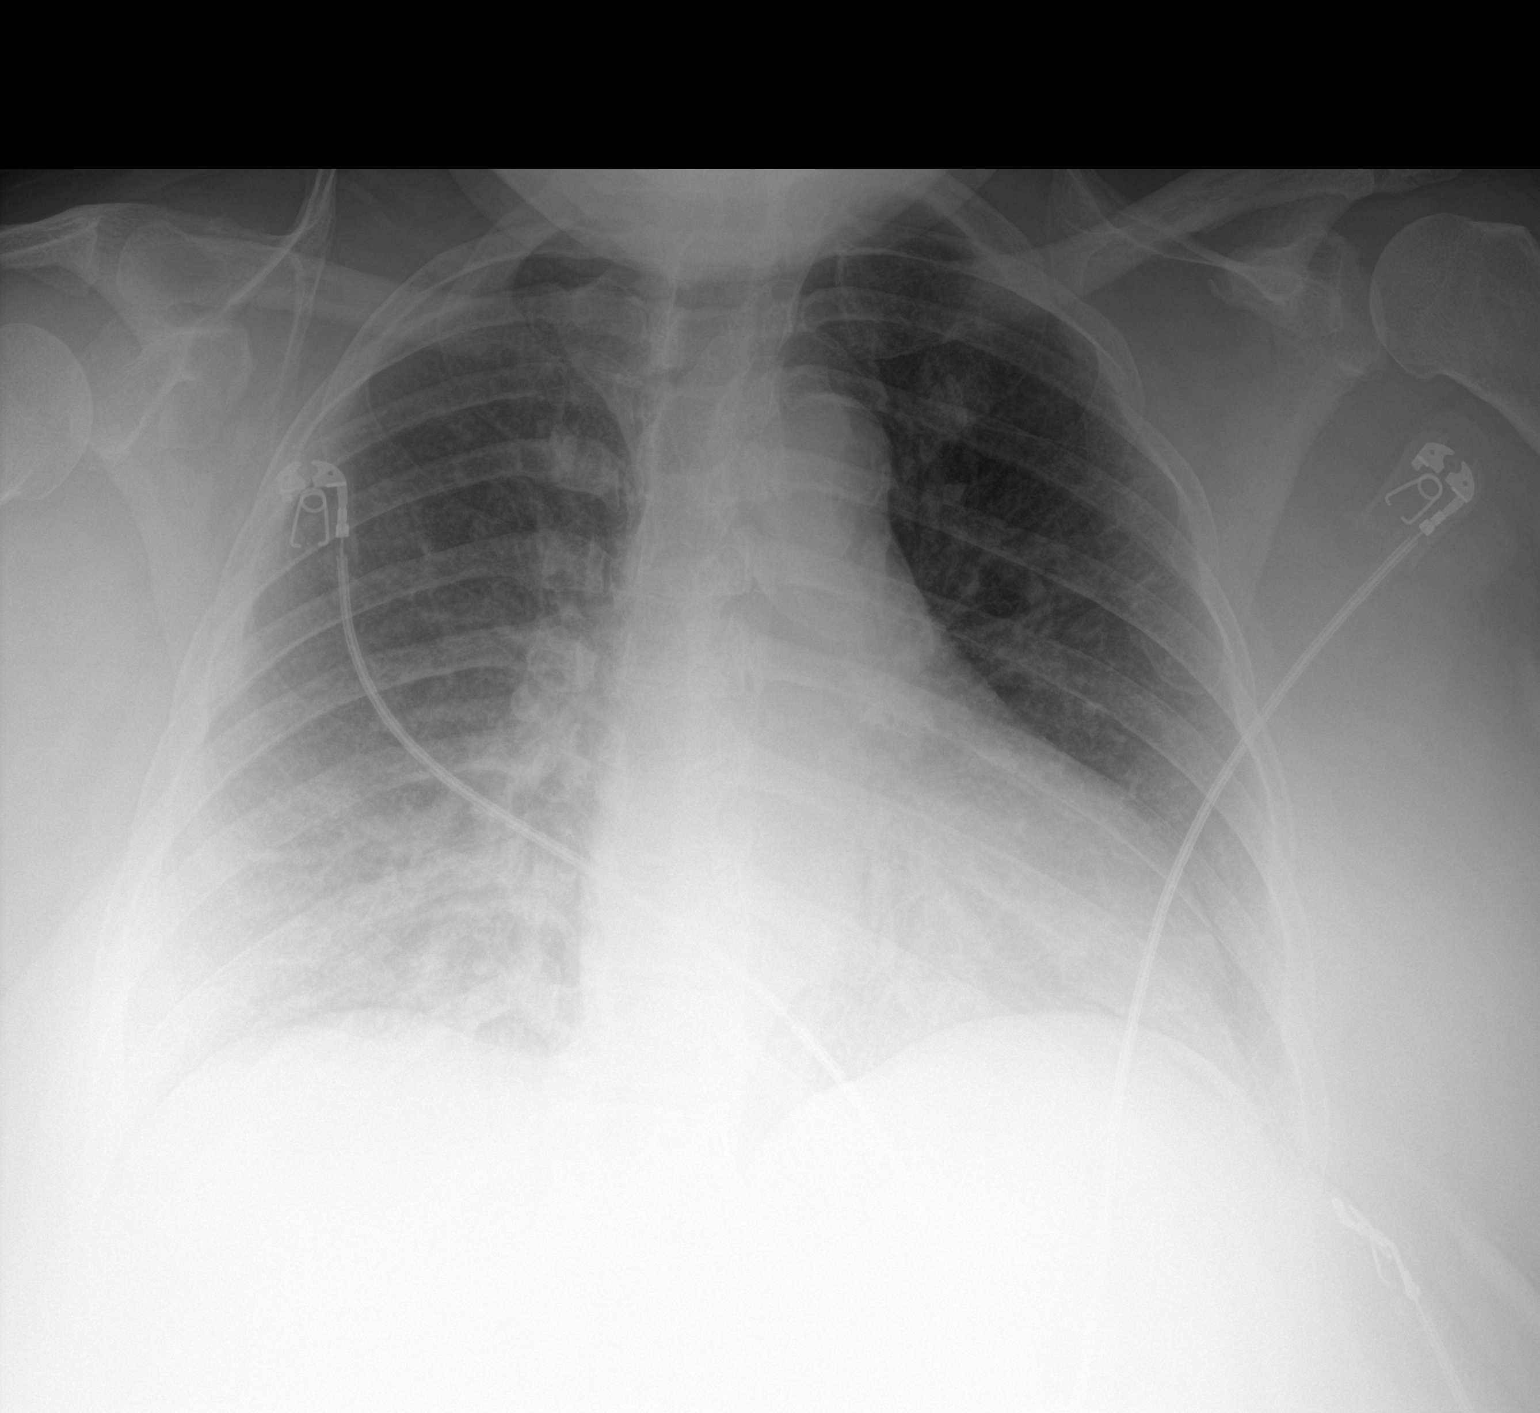

[1 of 1 positions shown; findings below may reference images not displayed]

FINDINGS: The lungs are symmetrically expanded. Mild right basilar atelectasis
or infiltrate. No pneumothorax or pleural effusion. Cardiac size
within normal limits. The pulmonary vascularity is normal. No acute
bone abnormality.
IMPRESSION: Mild right basilar atelectasis or infiltrate.

## 2020-11-11 IMAGING — MR MR HEAD W/O CM
4 of 5 series · 33 of 48 positions shown · non-contrast
Comparison: MRI head [DATE].  CT head [DATE]

CLINICAL DATA: Acute neuro deficit.  Recent stroke.

EXAM:
MRI HEAD WITHOUT CONTRAST
TECHNIQUE: Multiplanar, multiecho pulse sequences of the brain and surrounding
structures were obtained without intravenous contrast.

[Series 5: DWI · axial · 3.0mm · 0.88mm/px · z∈[-128,+23]mm · 13 of 104 slices shown (1 of 4)]
[im 1/104]
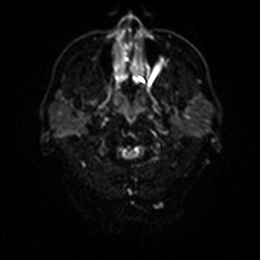
[im 9/104]
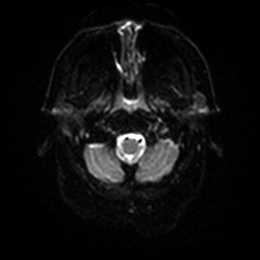
[im 18/104]
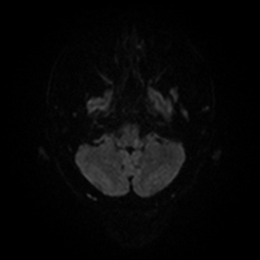
[im 26/104]
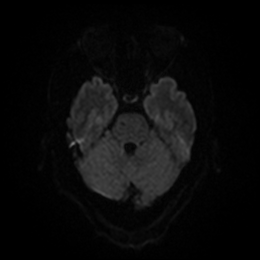
[im 35/104]
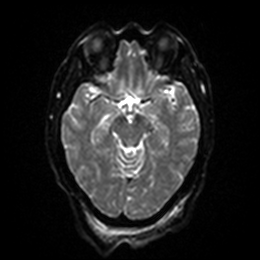
[im 43/104]
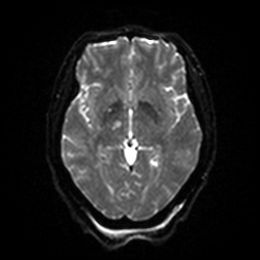
[im 52/104]
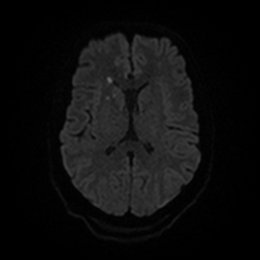
[im 61/104]
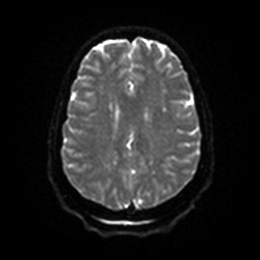
[im 69/104]
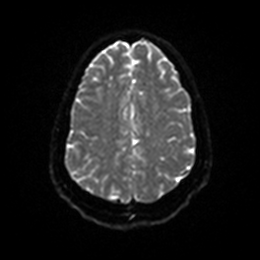
[im 78/104]
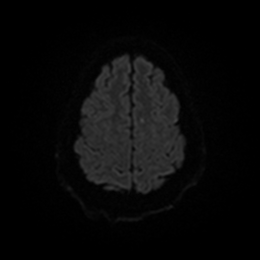
[im 86/104]
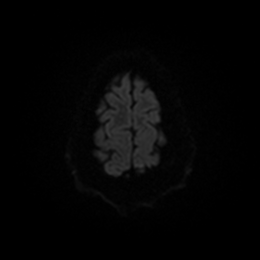
[im 95/104]
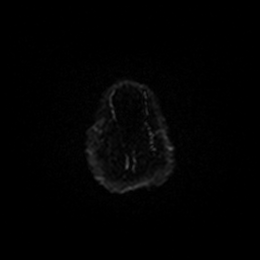
[im 104/104]
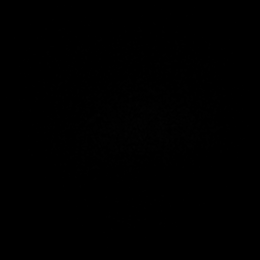

[Series 6: DWI · axial · 3.0mm · 0.88mm/px · z∈[-128,+20]mm · 6 of 51 slices shown (2 of 4)]
[im 1/51]
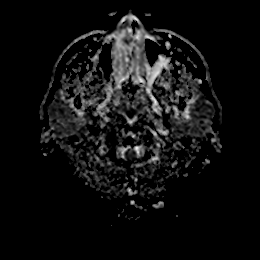
[im 11/51]
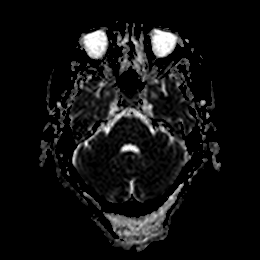
[im 21/51]
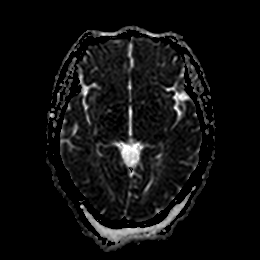
[im 31/51]
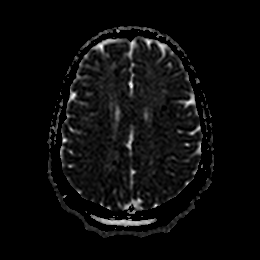
[im 41/51]
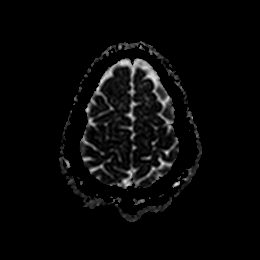
[im 51/51]
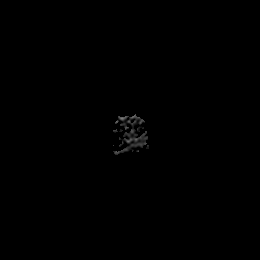

[Series 7: DWI · coronal · 4.0mm · 0.88mm/px · 9 of 76 slices shown (3 of 4)]
[im 1/76]
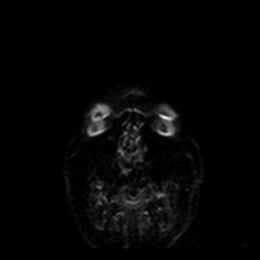
[im 10/76]
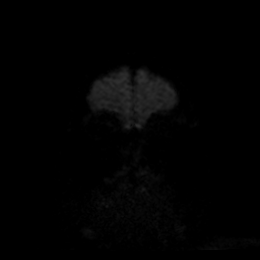
[im 19/76]
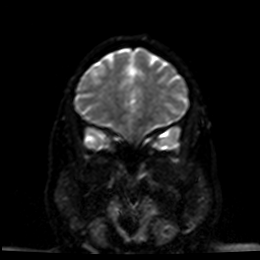
[im 29/76]
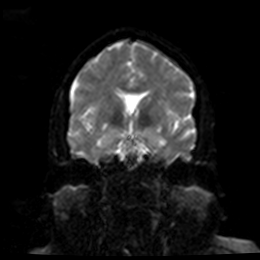
[im 38/76]
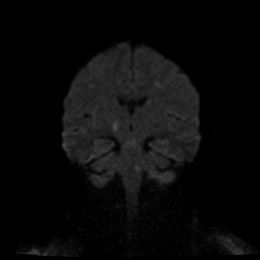
[im 47/76]
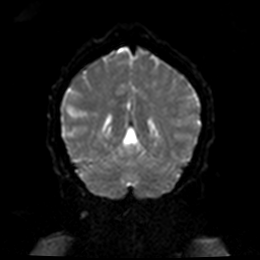
[im 57/76]
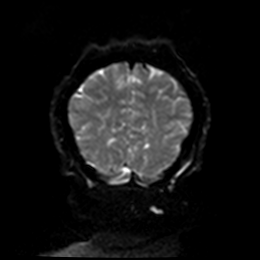
[im 66/76]
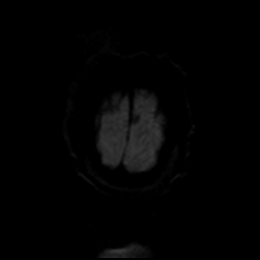
[im 76/76]
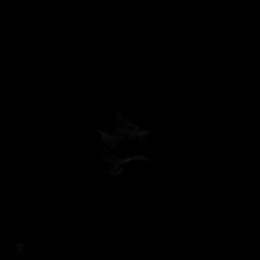

[Series 8: DWI · coronal · 4.0mm · 0.88mm/px · 5 of 38 slices shown (4 of 4)]
[im 1/38]
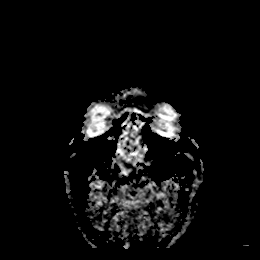
[im 10/38]
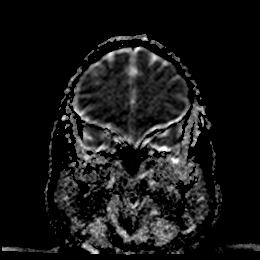
[im 19/38]
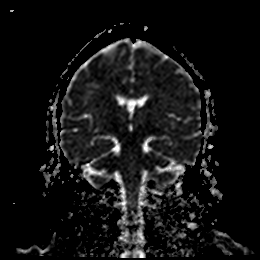
[im 28/38]
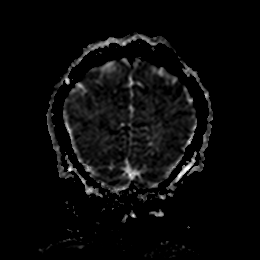
[im 38/38]
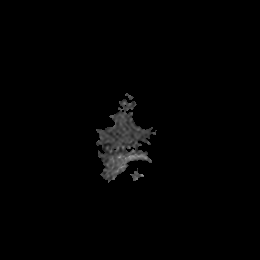

[33 of 48 positions shown; findings below may reference images not displayed]

FINDINGS: Brain: Limited study. Axial diffusion-weighted imaging is
diagnostic. Motion on the coronal diffusion. The patient refused
further imaging.

Subacute infarct in the left posterior midbrain unchanged from the
prior study.

Acute infarct in the right thalamus has progressed since the prior
MRI. In addition, there are multiple new small acute infarcts
throughout both cerebral hemispheres primarily involving the
subcortical white matter.
IMPRESSION: Very limited study. Progression of right thalamic infarct. Multiple
new small white matter infarcts throughout both cerebral hemispheres
suggestive of emboli.

## 2020-11-11 MED ORDER — ACETAMINOPHEN 325 MG PO TABS
650.0000 mg | ORAL_TABLET | ORAL | Status: DC | PRN
  Administered 2020-11-12 – 2020-11-30 (×16): 650 mg via ORAL
  Filled 2020-11-11 (×16): qty 2

## 2020-11-11 MED ORDER — ACETAMINOPHEN 650 MG RE SUPP
650.0000 mg | RECTAL | Status: DC | PRN
  Administered 2020-11-11: 650 mg via RECTAL
  Filled 2020-11-11: qty 1

## 2020-11-11 MED ORDER — HYDRALAZINE HCL 20 MG/ML IJ SOLN: 10.0000 mg | INTRAMUSCULAR | Status: DC | PRN

## 2020-11-11 MED ORDER — ENOXAPARIN SODIUM 40 MG/0.4ML ~~LOC~~ SOLN
40.0000 mg | SUBCUTANEOUS | Status: DC
  Administered 2020-11-11 – 2020-11-30 (×20): 40 mg via SUBCUTANEOUS
  Filled 2020-11-11 (×20): qty 0.4

## 2020-11-11 MED ORDER — ACETAMINOPHEN 160 MG/5ML PO SOLN: 650.0000 mg | ORAL | Status: DC | PRN

## 2020-11-11 MED ORDER — SODIUM CHLORIDE 0.9 % IV SOLN: INTRAVENOUS | Status: AC

## 2020-11-11 MED ORDER — ASPIRIN 325 MG PO TABS
325.0000 mg | ORAL_TABLET | Freq: Every day | ORAL | Status: DC
  Administered 2020-11-12 – 2020-11-30 (×19): 325 mg via ORAL
  Filled 2020-11-11 (×19): qty 1

## 2020-11-11 MED ORDER — ALBUTEROL SULFATE HFA 108 (90 BASE) MCG/ACT IN AERS: 2.0000 | INHALATION_SPRAY | Freq: Four times a day (QID) | RESPIRATORY_TRACT | Status: DC | PRN

## 2020-11-11 MED ORDER — RESOURCE THICKENUP CLEAR PO POWD
ORAL | Status: DC | PRN
  Filled 2020-11-11 (×2): qty 125

## 2020-11-11 MED ORDER — STROKE: EARLY STAGES OF RECOVERY BOOK
Freq: Once | Status: AC
  Filled 2020-11-11: qty 1

## 2020-11-11 MED ORDER — ASPIRIN 300 MG RE SUPP
300.0000 mg | Freq: Every day | RECTAL | Status: DC
  Administered 2020-11-11: 300 mg via RECTAL
  Filled 2020-11-11 (×2): qty 1

## 2020-11-11 NOTE — H&P (Signed)
History and Physical    Cathy Hall TZG:[id number] DOB: 1971/05/27 DOA: 11/10/2020  PCP: Patient, No Pcp Per  Patient coming from: Home.  Chief Complaint: Worsening dysarthria.  HPI: Cathy Hall is a 50 y.o. female with history of COPD cocaine abuse hypertension just discharged today earlier after being admitted for stroke with some left-sided weakness while sitting at home experienced worsening dysarthria and also difficulty managing her secretions.  Patient was brought in as a code stroke.  ED Course: In the ER patient underwent CT angiogram of the head and neck which did not show any large vessel obstruction.  On-call neurologist was consulted requested admission for further management of extension of further stroke.  Labs are significant for worsening creatinine presently is around 1.3.  Patient also has leukocytosis.  EKG shows normal sinus rhythm Covid test is pending.  On exam patient has mild weakness of the left upper and lower extremity which is known to the previous stroke.  Also has difficulty managing secretions and has failed swallow.  Chest x-ray is pending.  Review of Systems: As per HPI, rest all negative.   Past Medical History:  Diagnosis Date  . Anxiety   . Asthma   . COPD (chronic obstructive pulmonary disease) (HCC)   . Depression   . Hypertension   . Major depression   . PTSD (post-traumatic stress disorder)     Past Surgical History:  Procedure Laterality Date  . ECTOPIC PREGNANCY SURGERY       reports that she has been smoking cigarettes. She has never used smokeless tobacco. She reports current alcohol use. She reports current drug use. Drugs: Cocaine and Marijuana.  Allergies  Allergen Reactions  . Nsaids Other (See Comments)    Other reaction(s): Kidney Disorder    Family History  Problem Relation Age of Onset  . Cerebral aneurysm Mother   . Diabetes Sister   . Other Neg Hx     Prior to Admission medications    Medication Sig Start Date End Date Taking? Authorizing Provider  albuterol (VENTOLIN HFA) 108 (90 Base) MCG/ACT inhaler Inhale 2 puffs into the lungs every 6 (six) hours as needed for wheezing or shortness of breath.   Yes [provider]  amLODipine (NORVASC) 5 MG tablet Take 1 tablet (5 mg total) by mouth daily. 11/09/20 02/07/21 Yes [place], Alvira Philips, DO  aspirin EC 81 MG EC tablet Take 1 tablet (81 mg total) by mouth daily. Swallow whole. 11/09/20 02/07/21 Yes [place], Eric J, DO  atorvastatin (LIPITOR) 40 MG tablet Take 1 tablet (40 mg total) by mouth daily. 11/09/20 02/07/21 Yes [place], Alvira Philips, DO  clopidogrel (PLAVIX) 75 MG tablet Take 1 tablet (75 mg total) by mouth daily for 21 days. 11/09/20 11/30/20 Yes [place], Eric J, DO  diclofenac Sodium (VOLTAREN) 1 % GEL Apply 1 application topically 2 (two) times daily as needed (pain).   Yes [provider]  FLUoxetine (PROZAC) 10 MG capsule Take 1 capsule (10 mg total) by mouth daily. 08/09/20  Yes Zena Amos, MD  gabapentin (NEURONTIN) 100 MG capsule Take 1 capsule (100 mg total) by mouth 2 (two) times daily. 08/09/20  Yes Zena Amos, MD  hydrOXYzine (ATARAX/VISTARIL) 25 MG tablet Take 1 tablet (25 mg total) by mouth 3 (three) times daily as needed for anxiety. 08/09/20  Yes Zena Amos, MD  Multiple Vitamin (MULTIVITAMIN WITH MINERALS) TABS tablet Take 1 tablet by mouth daily.   Yes [provider]  OLANZapine (ZYPREXA) 5 MG  tablet Take 1 tablet (5 mg total) by mouth at bedtime. 08/09/20 08/09/21 Yes Zena Amos, MD  pantoprazole (PROTONIX) 40 MG tablet Take 1 tablet (40 mg total) by mouth daily. 11/09/20 02/07/21 Yes [place], Eric J, DO  polyvinyl alcohol (LIQUIFILM TEARS) 1.4 % ophthalmic solution Place 1 drop into both eyes daily as needed for dry eyes.   Yes [provider]    Physical Exam: Constitutional: Moderately built and nourished. Vitals:   11/10/20 2330 11/10/20 2345 11/11/20 0000 11/11/20 0045  BP: (!)  178/102 (!) 190/104 (!) 162/107 (!) 182/105  Pulse: 77 87 81 80  Resp: 17 14 19 19   Temp:      TempSrc:      SpO2: 93% 95% 92% 94%   Eyes: Anicteric no pallor. ENMT: No discharge from the ears eyes nose or mouth. Neck: No mass felt.  No neck rigidity. Respiratory: No rhonchi or crepitations. Cardiovascular: S1-S2 heard. Abdomen: Soft nontender bowel sounds present. Musculoskeletal: No edema. Skin: No rash. Neurologic: Alert awake oriented to her name but difficulty speaking due to due to worsening dysarthria left upper and lower extremities 4 x 5 right upper and lower extremity is 5 x 5.  Left facial droop.  Pupils are equal and reacting to light. Psychiatric: Appears normal.   Labs on Admission: I have personally reviewed following labs and imaging studies  CBC: Recent Labs  Lab 11/05/20 1806 11/05/20 1813 11/10/20 1953 11/10/20 2017  WBC 8.1  --  13.8*  --   NEUTROABS 5.6  --  10.6*  --   HGB 13.9 14.6 15.3* 16.7*  HCT 41.7 43.0 49.1* 49.0*  MCV 92.5  --  95.5  --   PLT 159  --  259  --    Basic Metabolic Panel: Recent Labs  Lab 11/05/20 1806 11/05/20 1813 11/06/20 0458 11/09/20 0325 11/10/20 1953 11/10/20 2017  NA 138 140 142 138 138 140  K 4.1 4.1 3.8 4.1 4.5 4.5  CL 102 103 104 102 100 104  CO2 24  --  24 25 22   --   GLUCOSE 122* 119* 123* 114* 103* 101*  BUN 14 18 14 13 20  23*  CREATININE 1.39* 1.30* 1.11* 0.87 1.30* 1.20*  CALCIUM 9.3  --  9.0 8.6* 9.5  --    GFR: Estimated Creatinine Clearance: 63.2 mL/min (A) (by C-G formula based on SCr of 1.2 mg/dL (H)). Liver Function Tests: Recent Labs  Lab 11/05/20 1806 11/06/20 0458 11/10/20 1953  AST 20 15 39  ALT 12 11 22   ALKPHOS 61 55 74  BILITOT 0.7 0.6 0.8  PROT 6.5 6.0* 8.1  ALBUMIN 3.5 3.3* 4.2   Recent Labs  Lab 11/06/20 0614  LIPASE 27   No results for input(s): AMMONIA in the last 168 hours. Coagulation Profile: Recent Labs  Lab 11/05/20 1806 11/10/20 1953  INR 0.9 1.0    Cardiac Enzymes: No results for input(s): CKTOTAL, CKMB, CKMBINDEX, TROPONINI in the last 168 hours. BNP (last 3 results) No results for input(s): PROBNP in the last 8760 hours. HbA1C: No results for input(s): HGBA1C in the last 72 hours. CBG: Recent Labs  Lab 11/05/20 1805 11/10/20 1955  GLUCAP 125* 97   Lipid Profile: No results for input(s): CHOL, HDL, LDLCALC, TRIG, CHOLHDL, LDLDIRECT in the last 72 hours. Thyroid Function Tests: No results for input(s): TSH, T4TOTAL, FREET4, T3FREE, THYROIDAB in the last 72 hours. Anemia Panel: No results for input(s): VITAMINB12, FOLATE, FERRITIN, TIBC, IRON, RETICCTPCT in the last 72  hours. Urine analysis:    Component Value Date/Time   COLORURINE YELLOW 11/06/2020 1334   APPEARANCEUR CLEAR 11/06/2020 1334   LABSPEC 1.013 11/06/2020 1334   PHURINE 6.0 11/06/2020 1334   GLUCOSEU NEGATIVE 11/06/2020 1334   HGBUR NEGATIVE 11/06/2020 1334   BILIRUBINUR NEGATIVE 11/06/2020 1334   KETONESUR NEGATIVE 11/06/2020 1334   PROTEINUR NEGATIVE 11/06/2020 1334   UROBILINOGEN 0.2 08/07/2020 1139   NITRITE NEGATIVE 11/06/2020 1334   LEUKOCYTESUR TRACE (A) 11/06/2020 1334   Sepsis Labs: @LABRCNTIP (procalcitonin:4,lacticidven:4) ) Recent Results (from the past 240 hour(s))  Resp Panel by RT-PCR (Flu A&B, Covid) Nasopharyngeal Swab     Status: None   Collection Time: 11/05/20 10:09 PM   Specimen: Nasopharyngeal Swab; Nasopharyngeal(NP) swabs in vial transport medium  Result Value Ref Range Status   SARS Coronavirus 2 by RT PCR NEGATIVE NEGATIVE Final    Comment: (NOTE) SARS-CoV-2 target nucleic acids are NOT DETECTED.  The SARS-CoV-2 RNA is generally detectable in upper respiratory specimens during the acute phase of infection. The lowest concentration of SARS-CoV-2 viral copies this assay can detect is 138 copies/mL. A negative result does not preclude SARS-Cov-2 infection and should not be used as the sole basis for treatment or other  patient management decisions. A negative result may occur with  improper specimen collection/handling, submission of specimen other than nasopharyngeal swab, presence of viral mutation(s) within the areas targeted by this assay, and inadequate number of viral copies(<138 copies/mL). A negative result must be combined with clinical observations, patient history, and epidemiological information. The expected result is Negative.  Fact Sheet for Patients:  01/03/21  Fact Sheet for Healthcare Providers:  BloggerCourse.com  This test is no t yet approved or cleared by the [employer].[place] FDA and  has been authorized for detection and/or diagnosis of SARS-CoV-2 by FDA under an Emergency Use Authorization (EUA). This EUA will remain  in effect (meaning this test can be used) for the duration of the COVID-19 declaration under Section 564(b)(1) of the Act, 21 U.S.C.section 360bbb-3(b)(1), unless the authorization is terminated  or revoked sooner.       Influenza A by PCR NEGATIVE NEGATIVE Final   Influenza B by PCR NEGATIVE NEGATIVE Final    Comment: (NOTE) The Xpert Xpress SARS-CoV-2/FLU/RSV plus assay is intended as an aid in the diagnosis of influenza from Nasopharyngeal swab specimens and should not be used as a sole basis for treatment. Nasal washings and aspirates are unacceptable for Xpert Xpress SARS-CoV-2/FLU/RSV testing.  Fact Sheet for Patients: [place]  Fact Sheet for Healthcare Providers: BloggerCourse.com  This test is not yet approved or cleared by the [employer]ious[employer]ker.[place] FDA and has been authorized for detection and/or diagnosis of SARS-CoV-2 by FDA under an Emergency Use Authorization (EUA). This EUA will remain in effect (meaning this test can be used) for the duration of the COVID-19 declaration under Section 564(b)(1) of the Act, 21 U.S.C. section  360bbb-3(b)(1), unless the authorization is terminated or revoked.  Performed at [facility], [address] [place], [place]      Radiological Exams on Admission: CT Code Stroke CTA Neck W/WO contrast  Result Date: 11/10/2020 CLINICAL DATA:  Stroke code. Focal neuro deficit, greater than 6 hours, stroke suspected. Aphasia, slurred speech, blurry vision. EXAM: CT ANGIOGRAPHY HEAD AND NECK TECHNIQUE: Multidetector CT imaging of the head and neck was performed using the standard protocol during bolus administration of intravenous contrast. Multiplanar CT image reconstructions and MIPs were obtained to evaluate the vascular anatomy. Carotid  stenosis measurements (when applicable) are obtained utilizing NASCET criteria, using the distal internal carotid diameter as the denominator. CONTRAST:  Administered contrast not known at this time. COMPARISON:  Noncontrast head CT, CT angiogram head/neck and CT perfusion 11/05/2020. FINDINGS: CTA NECK FINDINGS Aortic arch: Common origin of the innominate and left common carotid arteries. Minimal calcified plaque within the visualized aortic arch. Streak artifact from a dense left-sided contrast bolus partially obscures the left subclavian artery. Within this limitation, there is no appreciable hemodynamically significant innominate or proximal subclavian artery stenosis. Right carotid system: CCA and ICA patent within the neck without significant stenosis (50% or greater). Mild calcified plaque within the proximal ICA. Left carotid system: CCA and ICA patent within the neck without significant stenosis (50% or greater). Mild calcified plaque within the proximal ICA. Vertebral arteries: Patent within the neck bilaterally. Moderate stenosis within the proximal V1 right vertebral artery. Skeleton: No acute bony abnormality or aggressive osseous lesion. Cervical spondylosis with prominent multilevel ventral osteophytes. Other neck: No neck mass or cervical  lymphatic Upper chest: 6 mm nodule within the left upper lobe (series 8, image 323). Review of the MIP images confirms the above findings CTA HEAD FINDINGS Anterior circulation: The intracranial internal carotid arteries are patent. Calcified plaque within both vessels with no more than mild stenosis. The M1 middle cerebral arteries are patent. No M2 proximal branch occlusion or high-grade proximal stenosis is identified. The anterior cerebral arteries are patent. No intracranial aneurysm is identified. Posterior circulation: The intracranial vertebral arteries are patent. The basilar artery is patent. The posterior cerebral arteries are patent. Posterior communicating arteries are present bilaterally. Venous sinuses: Within the limitations of contrast timing, no convincing thrombus. Anatomic variants: As described Review of the MIP images confirms the above findings These results were communicated to Dr. Amada Jupiter At 8:37 pmon 1/6/2022by text page via the [facility] messaging system. IMPRESSION: CTA neck: 1. The common and internal carotid arteries are patent within the neck. Mild calcified plaque within the proximal ICAs bilaterally. 2. Vertebral arteries patent within the neck. Moderate stenosis within the proximal V1 right vertebral artery. 3. 6 mm left upper lobe pulmonary nodule. Non-contrast chest CT at 6-12 months is recommended. If the nodule is stable at time of repeat CT, then future CT at 18-24 months (from today's scan) is considered optional for low-risk patients, but is recommended for high-risk patients. This recommendation follows the consensus statement: Guidelines for Management of Incidental Pulmonary Nodules Detected on CT Images: From the Fleischner Society 2017; Radiology 2017; 284:228-243. CTA head: 1. No intracranial large vessel occlusion or proximal high-grade arterial stenosis. 2. Calcified plaque within the intracranial internal carotid arteries with no more than mild stenosis.  Electronically Signed   By: Jackey Loge DO   On: 11/10/2020 20:38   CT HEAD CODE STROKE WO CONTRAST  Result Date: 11/10/2020 CLINICAL DATA:  Code stroke. Neuro deficit, acute, stroke suspected. Stroke, aphasia, slurred speech, blurry vision, last known well 1900 EXAM: CT HEAD WITHOUT CONTRAST TECHNIQUE: Contiguous axial images were obtained from the base of the skull through the vertex without intravenous contrast. COMPARISON:  Brain MRI 11/05/2020. CT angiogram head/neck 11/05/2020, head CT 11/05/2020. FINDINGS: Brain: Cerebral volume is normal. A known subacute infarct within the posterior left midbrain was better appreciated on the brain MRI of 11/05/2020 (acute at that time). Redemonstrated subacute infarct within the ventral right thalamus. Redemonstrated chronic lacunar infarct within the right pons. Minimal ill-defined hypoattenuation within the cerebral white matter is nonspecific, but compatible with chronic  small vessel ischemic disease. There is no acute intracranial hemorrhage. No demarcated cortical infarct. No extra-axial fluid collection. No evidence of intracranial mass. No midline shift. Vascular: No hyperdense vessel. Skull: Normal. Negative for fracture or focal lesion. Sinuses/Orbits: Visualized orbits show no acute finding. Small left maxillary sinus mucous retention cysts. ASPECTS ([place] Stroke Program Early CT Score) - Ganglionic level infarction (caudate, lentiform nuclei, internal capsule, insula, M1-M3 cortex): 7 - Supraganglionic infarction (M4-M6 cortex): 3 Total score (0-10 with 10 being normal): 10 These results were called by telephone at the time of interpretation on 11/10/2020 at 8:15 pm to provider Dr. Leonel Ramsay, who verbally acknowledged these results. IMPRESSION: A known small subacute infarct within the posterior left midbrain was better appreciated on the prior MRI of 11/05/2020. Redemonstrated small subacute infarct within the ventral right thalamus. No CT evidence of  interval acute intracranial abnormality. Redemonstrated chronic infarct within the right pons. Stable minimal cerebral white matter chronic small vessel ischemic disease. Electronically Signed   By: Kellie Simmering DO   On: 11/10/2020 20:16    EKG: Independently reviewed.  Normal sinus rhythm with nonspecific changes.  Assessment/Plan Principal Problem:   Acute CVA (cerebrovascular accident) ([place]) Active Problems:   Essential hypertension   ARF (acute renal failure) ([place])    1. Acute CVA -appreciate neurology consult and recommendation.  Repeat MRI brain was attempted but due to worsening secretion and was unable to complete it.  Will reattempt it.  On neurochecks.  Patient failed swallow we will get speech therapy evaluation.  Presently n.p.o.  Continue antiplatelet agents per rectally. 2. Hypertensive urgency -allow for permissive hypertension.  As needed IV hydralazine for systolic blood pressure more than 662 and diastolic more than 947. 3. Acute renal failure could be from dehydration.  Gently hydrate follow metabolic panel. 4. History of cocaine abuse. 5. History of COPD.  Patient has failed swallow and speech therapy evaluation has been requested. Covid test and chest x-ray are pending.  Given that patient likely has extension of stroke with worsening management of her secretion will need close monitoring for any further worsening in inpatient status.   DVT prophylaxis: Lovenox. Code Status: Full code. Family Communication: Discussed with patient. Disposition Plan: May need rehab. Consults called: Neurology and speech therapy.  Physical therapy. Admission status: Inpatient.   Rise Patience MD [facility] Pager [phone].  If 7PM-7AM, please contact night-coverage [email or web address] Password [email or web address]  11/11/2020, 1:27 AM

## 2020-11-11 NOTE — Progress Notes (Signed)
Objective Swallowing Evaluation: Type of Study: MBS-Modified Barium Swallow Study   Patient Details  Name: Cathy Hall MRN: [id number] Date of Birth: Jun 17, 1971  Today's Date: 11/11/2020 Time: SLP Start Time (ACUTE ONLY): 1235 -SLP Stop Time (ACUTE ONLY): 1250  SLP Time Calculation (min) (ACUTE ONLY): 15 min   Past Medical History:  Past Medical History:  Diagnosis Date  . Anxiety   . Asthma   . COPD (chronic obstructive pulmonary disease) (HCC)   . Depression   . Hypertension   . Major depression   . PTSD (post-traumatic stress disorder)    Past Surgical History:  Past Surgical History:  Procedure Laterality Date  . ECTOPIC PREGNANCY SURGERY     HPI: Cathy Hall is a 50 y.o. female with history of COPD cocaine abuse hypertension just discharged today earlier after being admitted for stroke with some left-sided weakness while sitting at home experienced worsening dysarthria and also difficulty managing her secretions.   CT shows A known small subacute infarct within the posterior left midbrain from prior admission on 1/3, but there is concern for an new insult as well. Repeat MRI brain was attempted but due to worsening secretion and was unable to complete it.   Subjective: alert, pleasant at bedside    Assessment / Plan / Recommendation  CHL IP CLINICAL IMPRESSIONS 11/11/2020  Clinical Impression Pt demonstrates moderate to severe dysphagia with decreased sensation of aspiration at inconsistent swallow response. Initially primary concern was mild lingual residue that pt transited post swallow but did not intiatie swallow leading to aspriation of pooled residue in the pyriforms with thin liquids without cough response (PAS 8). Attempted to verbally cue a second swallow which was only intermittently initiated despite pts apparent effort and perception of having swallowed. Pt then tolerated nectar and puree fairly well there there were instances of  delayed swallow reponse. When solids were attemped mastication was adeaute, but pt packed the bolus in the pharynx without initaiting swallow and silently aspirated masticated graham cracker without cough as she was struggling to initaite swallow. Recommend a puree/nectar thick diet as swallowing opportunities with relatively safe textures will benefit recovery of neurologic swallow response. Pt may be at continued risk aspiration and supervision is needed.  SLP Visit Diagnosis Dysphagia, oropharyngeal phase (R13.12)  Attention and concentration deficit following --  Frontal lobe and executive function deficit following --  Impact on safety and function Moderate aspiration risk      CHL IP TREATMENT RECOMMENDATION 11/11/2020  Treatment Recommendations Therapy as outlined in treatment plan below     Prognosis 11/11/2020  Prognosis for Safe Diet Advancement Good  Barriers to Reach Goals --  Barriers/Prognosis Comment --    CHL IP DIET RECOMMENDATION 11/11/2020  SLP Diet Recommendations Dysphagia 1 (Puree) solids;Nectar thick liquid  Liquid Administration via Cup;Straw  Medication Administration Crushed with puree  Compensations Slow rate;Small sips/bites;Multiple dry swallows after each bite/sip  Postural Changes Seated upright at 90 degrees      CHL IP OTHER RECOMMENDATIONS 11/11/2020  Recommended Consults --  Oral Care Recommendations Oral care BID  Other Recommendations Have oral suction available      CHL IP FOLLOW UP RECOMMENDATIONS 11/11/2020  Follow up Recommendations Skilled Nursing facility      [facility] IP FREQUENCY AND DURATION 11/11/2020  Speech Therapy Frequency (ACUTE ONLY) min 2x/week  Treatment Duration 2 weeks           CHL IP ORAL PHASE 11/11/2020  Oral Phase Impaired  Oral - Pudding Teaspoon --  Oral - Pudding Cup --  Oral - Honey Teaspoon --  Oral - Honey Cup --  Oral - Nectar Teaspoon --  Oral - Nectar Cup Decreased bolus cohesion;Lingual/palatal residue  Oral -  Nectar Straw --  Oral - Thin Teaspoon --  Oral - Thin Cup Lingual/palatal residue;Decreased bolus cohesion  Oral - Thin Straw Lingual/palatal residue;Decreased bolus cohesion  Oral - Puree Lingual/palatal residue;Decreased bolus cohesion  Oral - Mech Soft --  Oral - Regular --  Oral - Multi-Consistency --  Oral - Pill --  Oral Phase - Comment --    CHL IP PHARYNGEAL PHASE 11/11/2020  Pharyngeal Phase Impaired  Pharyngeal- Pudding Teaspoon --  Pharyngeal --  Pharyngeal- Pudding Cup --  Pharyngeal --  Pharyngeal- Honey Teaspoon --  Pharyngeal --  Pharyngeal- Honey Cup --  Pharyngeal --  Pharyngeal- Nectar Teaspoon --  Pharyngeal --  Pharyngeal- Nectar Cup --  Pharyngeal --  Pharyngeal- Nectar Straw Pharyngeal residue - valleculae;Pharyngeal residue - pyriform  Pharyngeal --  Pharyngeal- Thin Teaspoon --  Pharyngeal --  Pharyngeal- Thin Cup --  Pharyngeal --  Pharyngeal- Thin Straw Pharyngeal residue - valleculae;Pharyngeal residue - pyriform;Penetration/Apiration after swallow;Trace aspiration  Pharyngeal Material enters airway, passes BELOW cords without attempt by patient to eject out (silent aspiration);Material does not enter airway  Pharyngeal- Puree Delayed swallow initiation-vallecula  Pharyngeal --  Pharyngeal- Mechanical Soft Delayed swallow initiation-pyriform sinuses;Penetration/Aspiration before swallow  Pharyngeal --  Pharyngeal- Regular --  Pharyngeal --  Pharyngeal- Multi-consistency --  Pharyngeal --  Pharyngeal- Pill --  Pharyngeal --  Pharyngeal Comment --     No flowsheet data found.  Harlon Ditty, MA CCC-SLP  Acute Rehabilitation Services Pager [phone] Office [phone]  Claudine Mouton 11/11/2020, 3:19 PM

## 2020-11-11 NOTE — Progress Notes (Addendum)
  PROGRESS NOTE  Patient admitted earlier this morning. See H&P.   Patient was recently discharged on 1/5 after stroke diagnosis.  She now returns with worsening dysarthria and difficulty managing secretion.  She was brought in as a code stroke.  CTA of the head and neck did not show acute findings.  Neurology was consulted. Patient seen in the emergency department.  She is able to tell me that she is hungry, remains weak on the left side. Repeat MRI is pending.   Status is: Inpatient  Remains inpatient appropriate because:IV treatments appropriate due to intensity of illness or inability to take PO and Inpatient level of care appropriate due to severity of illness   Dispo: The patient is from: Home              Anticipated d/c is to: SNF              Anticipated d/c date is: 2 days              Patient currently is not medically stable to d/c.  Repeat MRI pending.  She will need PT OT SLP reevaluation prior to discharge   Called and spoke with daughter-in-law Mearl Latin on the phone. She states that after her last hospitalization, she should not have been sent back home (per TOC/PT notes, sounds like they rec SNF vs home with 24 hr supervision/home health). Currently patient lives with her boyfriend but he is not able to provide enough care for her. She is also +cocaine use. Daughter-in-law also not able to provide 24 hour care and suspects that patient will need SNF on discharge.      Noralee Stain, DO [facility] 11/11/2020, 11:22 AM  Available via Epic secure chat 7am-7pm After these hours, please refer to coverage provider listed on [email or web address]

## 2020-11-11 NOTE — Progress Notes (Signed)
Clinical/Bedside Swallow Evaluation Patient Details  Name: Cathy Hall MRN: [id number] Date of Birth: 1971-01-04  Today's Date: 11/11/2020 Time: SLP Start Time (ACUTE ONLY): 1100 SLP Stop Time (ACUTE ONLY): 1110 SLP Time Calculation (min) (ACUTE ONLY): 10 min  Past Medical History:  Past Medical History:  Diagnosis Date  . Anxiety   . Asthma   . COPD (chronic obstructive pulmonary disease) (HCC)   . Depression   . Hypertension   . Major depression   . PTSD (post-traumatic stress disorder)    Past Surgical History:  Past Surgical History:  Procedure Laterality Date  . ECTOPIC PREGNANCY SURGERY     HPI:  Cathy Hall is a 50 y.o. female with history of COPD cocaine abuse hypertension just discharged today earlier after being admitted for stroke with some left-sided weakness while sitting at home experienced worsening dysarthria and also difficulty managing her secretions.   CT shows A known small subacute infarct within the posterior left midbrain from prior admission on 1/3, but there is concern for an new insult as well. Repeat MRI brain was attempted but due to worsening secretion and was unable to complete it.   Assessment / Plan / Recommendation Clinical Impression  Pt demonstrates no immediate signs of aspiration with sips of water, but snoring respiration and coughing with secretions warrant further assessment. Will proceed with MBS. SLP Visit Diagnosis: Dysphagia, oropharyngeal phase (R13.12)    Aspiration Risk  Severe aspiration risk    Diet Recommendation NPO   Liquid Administration via: Cup;Straw    Other  Recommendations     Follow up Recommendations Skilled Nursing facility      Frequency and Duration min 2x/week  2 weeks       Prognosis Prognosis for Safe Diet Advancement: Good      Swallow Study   General HPI: Cathy Hall is a 50 y.o. female with history of COPD cocaine abuse hypertension just discharged  today earlier after being admitted for stroke with some left-sided weakness while sitting at home experienced worsening dysarthria and also difficulty managing her secretions.   CT shows A known small subacute infarct within the posterior left midbrain from prior admission on 1/3, but there is concern for an new insult as well. Repeat MRI brain was attempted but due to worsening secretion and was unable to complete it. Type of Study: Bedside Swallow Evaluation Previous Swallow Assessment: none on file Diet Prior to this Study: NPO Temperature Spikes Noted: No Respiratory Status: Room air History of Recent Intubation: No Behavior/Cognition: Alert;Cooperative;Pleasant mood Oral Cavity Assessment: Within Functional Limits Oral Care Completed by SLP: No Oral Cavity - Dentition: Missing dentition Vision: Functional for self-feeding Self-Feeding Abilities: Able to feed self Patient Positioning: Upright in bed Baseline Vocal Quality: Low vocal intensity Volitional Cough: Congested Volitional Swallow: Able to elicit    Oral/Motor/Sensory Function Overall Oral Motor/Sensory Function: Moderate impairment Facial ROM: Reduced left;Suspected CN VII (facial) dysfunction Facial Symmetry: Abnormal symmetry left;Suspected CN VII (facial) dysfunction Facial Strength: Reduced left;Suspected CN VII (facial) dysfunction Lingual ROM: Within Functional Limits Lingual Symmetry: Within Functional Limits Lingual Strength: Within Functional Limits Lingual Sensation: Within Functional Limits   Ice Chips Ice chips: Within functional limits   Thin Liquid Thin Liquid: Within functional limits Presentation: Straw    Nectar Thick Nectar Thick Liquid: Not tested   Honey Thick Honey Thick Liquid: Not tested   Puree Puree: Not tested   Solid     Solid: Not tested     Harlon Ditty,  MA CCC-SLP  Acute Rehabilitation Services Pager [phone] Office [phone]  Claudine Mouton 11/11/2020,2:56  PM

## 2020-11-11 NOTE — ED Notes (Signed)
Neurology at bedside.

## 2020-11-11 NOTE — ED Notes (Signed)
Pt transported to MRI 

## 2020-11-11 NOTE — Progress Notes (Addendum)
STROKE TEAM PROGRESS NOTE  HPI per record: Cathy Hall is a 50 y.o. female with history of COPD cocaine abuse hypertension just discharged today earlier after being admitted for stroke with some left-sided weakness while sitting at home experienced worsening dysarthria and also difficulty managing her secretions.  Patient was brought in as a code stroke.   INTERVAL HISTORY Patient evaluated at bedside this morning. She was lying on her left side and had difficulty moving for examination. She denies using any illicit drugs at home after discharge. Were unable to obtain MRI due to patient being unable to hold her secretions. Notes her oral secretions are much controlled than yesterday and feels ready to go for MRI. Failed bedside swallow test by RN, so done by SLP and they suggests NPO and would do Modified barium swallow. Physical therapy suggests SNF.  Patient was d/c on 11/10/19 after she came with ischemic stroke in right anterior thalamus. UDS is positive for cocaine and THC. MRI shows progression of R thalamic infarct and multiple new small white matter infarcts throughout both cerebral hemispheres.  Permissive hypertension. See below for details of neurological exam.   Vitals:   11/11/20 1345 11/11/20 1430 11/11/20 1500 11/11/20 1600  BP: (!) 165/100 (!) 166/99  (!) 172/98  Pulse: 65 (!) 58 68 64  Resp: 19 18 18 16   Temp:      TempSrc:      SpO2: 95% 97% 95% 96%   CBC:  Recent Labs  Lab 11/05/20 1806 11/05/20 1813 11/10/20 1953 11/10/20 2017 11/11/20 0156  WBC 8.1  --  13.8*  --  12.6*  NEUTROABS 5.6  --  10.6*  --   --   HGB 13.9   < > 15.3* 16.7* 14.4  HCT 41.7   < > 49.1* 49.0* 45.6  MCV 92.5  --  95.5  --  94.6  PLT 159  --  259  --  247   < > = values in this interval not displayed.   Basic Metabolic Panel:  Recent Labs  Lab 11/09/20 0325 11/10/20 1953 11/10/20 2017 11/11/20 0156  NA 138 138 140  --   K 4.1 4.5 4.5  --   CL 102 100 104  --   CO2 25  22  --   --   GLUCOSE 114* 103* 101*  --   BUN 13 20 23*  --   CREATININE 0.87 1.30* 1.20* 1.03*  CALCIUM 8.6* 9.5  --   --    Lipid Panel:  Recent Labs  Lab 11/06/20 0458  CHOL 196  TRIG 162*  HDL 51  CHOLHDL 3.8  VLDL 32  LDLCALC 01/04/21*   HgbA1c:  Recent Labs  Lab 11/06/20 0458  HGBA1C 5.5   Urine Drug Screen:  Recent Labs  Lab 11/11/20 0951  LABOPIA NONE DETECTED  COCAINSCRNUR POSITIVE*  LABBENZ NONE DETECTED  AMPHETMU NONE DETECTED  THCU POSITIVE*  LABBARB NONE DETECTED    Alcohol Level  Recent Labs  Lab 11/05/20 1806  ETH <10    IMAGING past 24 hours  CT Code Stroke  IMPRESSION: A known small subacute infarct within the posterior left midbrain was better appreciated on the prior MRI of 11/05/2020.   Redemonstrated small subacute infarct within the ventral right thalamus.   No CT evidence of interval acute intracranial abnormality.   Redemonstrated chronic infarct within the right pons.   Stable minimal cerebral white matter chronic small vessel ischemic disease.   CT Angio Head and  Neck  IMPRESSION: CTA neck:   1. The common and internal carotid arteries are patent within the neck. Mild calcified plaque within the proximal ICAs bilaterally. 2. Vertebral arteries patent within the neck. Moderate stenosis within the proximal V1 right vertebral artery. 3. 6 mm left upper lobe pulmonary nodule. Non-contrast chest CT at 6-12 months is recommended. If the nodule is stable at time of repeat CT, then future CT at 18-24 months (from today's scan) is considered optional for low-risk patients, but is recommended for high-risk patients. This recommendation follows the consensus statement: Guidelines for Management of Incidental Pulmonary Nodules Detected on CT Images: From the Fleischner Society 2017; Radiology 2017; 284:228-243.   CTA head:   1. No intracranial large vessel occlusion or proximal high-grade arterial stenosis. 2. Calcified plaque  within the intracranial internal carotid arteries with no more than mild stenosis.   Chest x-ray  IMPRESSION: Mild right basilar atelectasis or infiltrate.  MRI Brain  IMPRESSION: Very limited study. Progression of right thalamic infarct. Multiple new small white matter infarcts throughout both cerebral hemispheres suggestive of emboli.     PHYSICAL EXAM General: Well developed, morbidly obese female lying comfortably in bed, NAD HEENT:[place]/AT, neck supple, no discharge from eyes, nose or mouth.  Cv: RRR, S1, S2 heard, no m/r/g Pulmonary: CTAB Psychiatric: Normal mood and affect, normal thought content Neurological exam:  General: NAD Mental Status: Alert, oriented, thought content appropriate.  Speech fluent without evidence of aphasia.  Able to follow 3 step commands without difficulty. Cranial Nerves: II:  Visual fields grossly normal, pupils equal, round, reactive to light and accommodation III,IV, VI: ptosis not present, extra-ocular motions intact bilaterally V,VII: smile asymmetric with left lower facial weakness, facial light touch sensation normal bilaterally VIII: hearing normal bilaterally IX,X: uvula rises symmetrically XI: bilateral shoulder shrug XII: midline tongue extension without atrophy or fasciculations  Motor: Right : Upper extremity   5/5    Left:     Upper extremity   4/5  Lower extremity   5/5     Lower extremity   4/5 Tone and bulk:normal tone throughout; no atrophy noted Sensory: Pinprick and light touch intact throughout, bilaterally  Plantars: Right: downgoing   Left: downgoing Cerebellar: normal finger-to-nose,  normal heel-to-shin test Gait: Deferred  ASSESSMENT/PLAN [name] is a 50 y.o. female with PMHx history of anxiety, COPD, tobacco use, morbid obesity, PTSD, major depression and HTN, presenting from home to [facility] for worsening of dysarthria and left sided weakness. She did not receive IV t-PA due to hx of  recent stroke.   Stroke: acute progression of right thalamic infarct. 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 Redemonstrated chronic infarct within the right pons.  CTA head & neck : No intracranial large vessel occlusion or proximal high-grade arterial stenosis. MRI : Progression of right thalamic infarct. Multiple new small white matter infarcts throughout both cerebral hemispheres suggestive of emboli. 2D Echo: EF 50-55%.  No cardiac source of embolism.  Sars Corona Virus 2 - negative LDL 113 HgbA1c 5.5 VTE prophylaxis - Lovenox    Diet   DIET - DYS 1 Room service appropriate? Yes; Fluid consistency: Nectar Thick   aspirin 81 mg daily and clopidogrel 75 mg daily prior to admission, now on aspirin 81 mg daily, will start plavix when able to  swallow. Therapy recommendations:  SNF Disposition:  pending  Hypertension Home meds:  Maxzide  Current BP meds: Hydralazine prn Stable Permissive hypertension (OK if < 220/120) but gradually normalize in 5-7 days Long-term BP goal normotensive  Hyperlipidemia Home meds:  Lipitor 40 mg, hold in hospital LDL 113, goal < 70 Continue statin at discharge   Other Stroke Risk Factors Cigarette smoker advised to stop smoking ETOH use, alcohol level <10, advised to drink no more than 1 drinka day Substance abuse - UDS:  THC POSITIVE, Cocaine POSITIVE. Patient advised to stop using due to stroke risk. Obesity, BMI >/= 30 associated with increased stroke risk, recommend weight loss, diet and exercise as appropriate  Hx stroke/TIA  Other Active Problems Code status: Full code  Hospital day # 0 I have personally obtained history,examined this patient, reviewed notes, independently viewed imaging  studies, participated in medical decision making and plan of care.ROS completed by me personally and pertinent positives fully documented  I have made any additions or clarifications directly to the above note. Agree with note above.  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  Patient counseled to quit cocaine and cigarettes aggressive risk factor modification.  Speech therapy to swallow eval.  Ongoing therapy evaluations.  No need to repeat a lot of stroke related testing as she had a complete work-up earlier this week.  Continue aspirin and Plavix if she is able to swallow.  Greater than 50% time during this 35-minute visit was spent on counseling and coordination of care multiple strokes answering questions.  Discussed with Dr. Maylene Roes.  Antony Contras, MD Medical Director [facility] Pager: [phone] 11/11/2020 4:24 PM   To contact Stroke Continuity provider, please refer to [email or web address] After hours, contact General Neurology

## 2020-11-11 NOTE — ED Notes (Signed)
Lunch Tray Ordered @ [address] [place], RN called by Marylene Land

## 2020-11-11 NOTE — Evaluation (Addendum)
Physical Therapy Evaluation Patient Details Name: Cathy Hall MRN: [id number] DOB: May 04, 1971 Today's Date: 11/11/2020   History of Present Illness  Pt is a 50 y/o female admitted secondary to worsening L sided weakness, difficulty managing secretions and dysarthria. Thought to be secondary to extension of recent CVA. Recently discharged secondary to CVA. PMH inclues CVA, COPD, drug abuse, PTSD.  Clinical Impression  Pt admitted secondary to problem above with deficits below. Presenting with L extremity weakness and limited secondary to dizziness. Required mod A for bed mobility and to stand at edge of stretcher. Pt with increased sway and unsteadiness upon standing, so further mobility unsafe to attempt with +1. Per notes, pt with difficulty managing at home. Recommending SNF level therapies at d/c. Will continue to follow acutely.     Follow Up Recommendations SNF;Supervision/Assistance - 24 hour    Equipment Recommendations  Other (comment) (TBD)    Recommendations for Other Services       Precautions / Restrictions Precautions Precautions: Fall Restrictions Weight Bearing Restrictions: No      Mobility  Bed Mobility Overal bed mobility: Needs Assistance Bed Mobility: Supine to Sit;Sit to Supine     Supine to sit: Mod assist Sit to supine: Min guard   General bed mobility comments: Mod A for trunk assist to safely come to sitting position. Reporting increased wooziness upon sitting that resolved.    Transfers Overall transfer level: Needs assistance Equipment used: None Transfers: Sit to/from Stand Sit to Stand: Mod assist         General transfer comment: Mod A for steadying assist to stand from stretcher. Pt with increased sway and dizziness, so returned to sitting position. VSS  Ambulation/Gait                Stairs            Wheelchair Mobility    Modified Rankin (Stroke Patients Only) Modified Rankin (Stroke Patients  Only) Pre-Morbid Rankin Score: Slight disability Modified Rankin: Moderately severe disability     Balance Overall balance assessment: Needs assistance Sitting-balance support: No upper extremity supported;Feet supported Sitting balance-Leahy Scale: Fair     Standing balance support: No upper extremity supported Standing balance-Leahy Scale: Poor Standing balance comment: reliant on external support                             Pertinent Vitals/Pain Pain Assessment: Faces Faces Pain Scale: Hurts little more Pain Location: headache Pain Descriptors / Indicators: Headache Pain Intervention(s): Limited activity within patient's tolerance;Monitored during session;Repositioned    Home Living Family/patient expects to be discharged to:: Private residence Living Arrangements: Other relatives (grandchildren) Available Help at Discharge: Family;Available PRN/intermittently Type of Home: House Home Access: Level entry     Home Layout: Two level Home Equipment: Walker - 2 wheels Additional Comments: Reporting different information from previous session. Reports she has a flight of steps at home.    Prior Function Level of Independence: Independent with assistive device(s)         Comments: Had been using RW for mobility since d/c from the hospital     Hand Dominance        Extremity/Trunk Assessment   Upper Extremity Assessment Upper Extremity Assessment: Defer to OT evaluation    Lower Extremity Assessment Lower Extremity Assessment: LLE deficits/detail LLE Deficits / Details: Grossly 4-/5 throughout. Noted functional weakness when standing.    Cervical / Trunk Assessment Cervical / Trunk Assessment: Normal  Communication   Communication: No difficulties  Cognition Arousal/Alertness: Awake/alert Behavior During Therapy: WFL for tasks assessed/performed Overall Cognitive Status: No family/caregiver present to determine baseline cognitive functioning                                  General Comments: Pt with decreased awareness of safety and requiring safety cues for mobility tasks.      General Comments      Exercises     Assessment/Plan    PT Assessment Patient needs continued PT services  PT Problem List Decreased strength;Decreased activity tolerance;Decreased mobility;Decreased balance;Decreased knowledge of use of DME;Decreased safety awareness;Impaired sensation;Decreased knowledge of precautions       PT Treatment Interventions DME instruction;Gait training;Functional mobility training;Therapeutic activities;Therapeutic exercise;Balance training;Neuromuscular re-education;Patient/family education;Stair training    PT Goals (Current goals can be found in the Care Plan section)  Acute Rehab PT Goals Patient Stated Goal: to be independent PT Goal Formulation: With patient Time For Goal Achievement: 11/25/20 Potential to Achieve Goals: Fair    Frequency Min 3X/week   Barriers to discharge        Co-evaluation               AM-PAC PT "6 Clicks" Mobility  Outcome Measure Help needed turning from your back to your side while in a flat bed without using bedrails?: A Little Help needed moving from lying on your back to sitting on the side of a flat bed without using bedrails?: A Lot Help needed moving to and from a bed to a chair (including a wheelchair)?: A Lot Help needed standing up from a chair using your arms (e.g., wheelchair or bedside chair)?: A Lot Help needed to walk in hospital room?: A Lot Help needed climbing 3-5 steps with a railing? : Total 6 Click Score: 12    End of Session Equipment Utilized During Treatment: Gait belt Activity Tolerance: Treatment limited secondary to medical complications (Comment) (dizziness) Patient left: in bed;with call bell/phone within reach (on stretcher in ED) Nurse Communication: Mobility status PT Visit Diagnosis: Unsteadiness on feet (R26.81);Muscle  weakness (generalized) (M62.81)    Time: 1308-6578 PT Time Calculation (min) (ACUTE ONLY): 15 min   Charges:   PT Evaluation $PT Eval Moderate Complexity: 1 Mod          Farley Ly, PT, DPT  Acute Rehabilitation Services  Pager: [phone] Office: [phone]   Le[facility] Prom 11/11/2020, 2:12 PM

## 2020-11-11 NOTE — ED Notes (Addendum)
Pt transported for swallow eval

## 2020-11-12 DIAGNOSIS — R531 Weakness: Secondary | ICD-10-CM

## 2020-11-12 DIAGNOSIS — R471 Dysarthria and anarthria: Secondary | ICD-10-CM

## 2020-11-12 LAB — BASIC METABOLIC PANEL
Anion gap: 10 (ref 5–15)
BUN: 15 mg/dL (ref 6–20)
CO2: 25 mmol/L (ref 22–32)
Calcium: 8.9 mg/dL (ref 8.9–10.3)
Chloride: 104 mmol/L (ref 98–111)
Creatinine, Ser: 0.9 mg/dL (ref 0.44–1.00)
GFR, Estimated: >60 mL/min (ref >60)
Glucose, Bld: 102 mg/dL — ABNORMAL HIGH (ref 70–99)
Potassium: 3.8 mmol/L (ref 3.5–5.1)
Sodium: 139 mmol/L (ref 135–145)

## 2020-11-12 LAB — CBC
HCT: 43.5 % (ref 36.0–46.0)
Hemoglobin: 13.9 g/dL (ref 12.0–15.0)
MCH: 29.9 pg (ref 26.0–34.0)
MCHC: 32 g/dL (ref 30.0–36.0)
MCV: 93.5 fL (ref 80.0–100.0)
Platelets: 226 10*3/uL (ref 150–400)
RBC: 4.65 MIL/uL (ref 3.87–5.11)
RDW: 12.7 % (ref 11.5–15.5)
WBC: 9.5 10*3/uL (ref 4.0–10.5)
nRBC: 0 % (ref 0.0–0.2)

## 2020-11-12 LAB — GLUCOSE, CAPILLARY
Glucose-Capillary: 159 mg/dL — ABNORMAL HIGH (ref 70–99)
Glucose-Capillary: 160 mg/dL — ABNORMAL HIGH (ref 70–99)
Glucose-Capillary: 96 mg/dL (ref 70–99)

## 2020-11-12 MED ORDER — HYDROXYZINE HCL 25 MG PO TABS
25.0000 mg | ORAL_TABLET | Freq: Three times a day (TID) | ORAL | Status: DC | PRN
  Administered 2020-11-14 – 2020-11-28 (×8): 25 mg via ORAL
  Filled 2020-11-12 (×8): qty 1

## 2020-11-12 MED ORDER — OLANZAPINE 2.5 MG PO TABS
5.0000 mg | ORAL_TABLET | Freq: Every day | ORAL | Status: DC
  Administered 2020-11-12 – 2020-11-29 (×18): 5 mg via ORAL
  Filled 2020-11-12 (×20): qty 2

## 2020-11-12 MED ORDER — PANTOPRAZOLE SODIUM 40 MG PO TBEC
40.0000 mg | DELAYED_RELEASE_TABLET | Freq: Every day | ORAL | Status: DC
  Administered 2020-11-12 – 2020-11-30 (×19): 40 mg via ORAL
  Filled 2020-11-12 (×20): qty 1

## 2020-11-12 MED ORDER — ATORVASTATIN CALCIUM 40 MG PO TABS
40.0000 mg | ORAL_TABLET | Freq: Every day | ORAL | Status: DC
Start: 2020-11-12 — End: 2020-11-30
  Administered 2020-11-12 – 2020-11-30 (×19): 40 mg via ORAL
  Filled 2020-11-12 (×19): qty 1

## 2020-11-12 MED ORDER — CLOPIDOGREL BISULFATE 75 MG PO TABS
75.0000 mg | ORAL_TABLET | Freq: Every day | ORAL | Status: DC
  Administered 2020-11-12 – 2020-11-30 (×19): 75 mg via ORAL
  Filled 2020-11-12 (×19): qty 1

## 2020-11-12 MED ORDER — FLUOXETINE HCL 10 MG PO CAPS
10.0000 mg | ORAL_CAPSULE | Freq: Every day | ORAL | Status: DC
  Administered 2020-11-12 – 2020-11-30 (×19): 10 mg via ORAL
  Filled 2020-11-12 (×19): qty 1

## 2020-11-12 MED ORDER — AMLODIPINE BESYLATE 5 MG PO TABS
5.0000 mg | ORAL_TABLET | Freq: Every day | ORAL | Status: DC
  Administered 2020-11-12 – 2020-11-30 (×19): 5 mg via ORAL
  Filled 2020-11-12 (×19): qty 1

## 2020-11-12 MED ORDER — POLYVINYL ALCOHOL 1.4 % OP SOLN
1.0000 [drp] | Freq: Every day | OPHTHALMIC | Status: DC | PRN
  Filled 2020-11-12: qty 15

## 2020-11-12 MED ORDER — ADULT MULTIVITAMIN W/MINERALS CH
1.0000 | ORAL_TABLET | Freq: Every day | ORAL | Status: DC
  Administered 2020-11-12 – 2020-11-30 (×19): 1 via ORAL
  Filled 2020-11-12 (×19): qty 1

## 2020-11-12 MED ORDER — GABAPENTIN 100 MG PO CAPS
100.0000 mg | ORAL_CAPSULE | Freq: Two times a day (BID) | ORAL | Status: DC
  Administered 2020-11-12 – 2020-11-19 (×15): 100 mg via ORAL
  Filled 2020-11-12 (×15): qty 1

## 2020-11-12 NOTE — Progress Notes (Signed)
Progress Note    Cathy Hall  OLM:[id number] DOB: Jan 18, 1971  DOA: 11/10/2020 PCP: Patient, No Pcp Per    Brief Narrative:   Chief complaint: Worsening dysarthria s/p recent stroke  Medical records reviewed and are as summarized below:  Cathy Hall is an 50 y.o. female with history of COPD, cocaine abuse, hypertension just discharged today earlier after being admitted for stroke with some left-sided weakness while sitting at home experienced worsening dysarthria and also difficulty managing her secretions.  Patient was brought in as a code stroke.  Assessment/Plan:   Principal Problem:   Acute CVA (cerebrovascular accident) (HCC) associated with dysphagia, dysarthria and mild left hemiparesis -Neurologist consulted. Repeat MRI shows progression of thalamic stroke and new areas of embolic stroke. -ST evaluation performed showing moderate to severe dysphagia.  Patient does not want to comply with prescribed diet.  -Continue ASA.  Was not on Plavix as recommended by stroke service, so will add. Also, resume statin. -H/O cocaine abuse. 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 -Full stroke work up already completed, no new recommendations. Spoke with neurologist personally to discuss.  Active Problems:   Essential hypertension Systolic BP up in the 170's. Continue hydralazine PRN. Resume Norvasc.    ARF (acute renal failure) (HCC) Baseline creatinine 0.87 with admission creatinine of 1.30. Monitor.    Cocaine Abuse Counsel on cessation.    Obesity RN to check height and weight.  Nutritional status        There is no height or weight on file to calculate BMI.   Family Communication/Anticipated D/C date and plan/Code Status   DVT prophylaxis: enoxaparin (LOVENOX) injection 40 mg Start: 11/11/20 1000   Code Status: Full Code.  Family Communication: No family at the bedside. Disposition Plan: Status  is: Inpatient  Remains inpatient appropriate because: of persistent dysphagia.  Will need to be able to protect her airway before safe to d/c.    Dispo: The patient is from: Home              Anticipated d/c is to: Home              Anticipated d/c date is: 1 day              Patient currently is not medically stable to d/c.   Medical Consultants:    Neurology   Anti-Infectives:    None  Subjective:   Reports headache and mild dizziness.  Still reports dysarthria.  Feels weaker on left side. No nausea or vomiting, no chest pain, no difficulty swallowing.  Objective:    Vitals:   11/11/20 2349 11/12/20 0608 11/12/20 0745 11/12/20 1141  BP: (!) 177/94 (!) 172/100 (!) 142/81 130/67  Pulse: 73 69 70 82  Resp: 19  16 18   Temp: 98.3 F (36.8 C) 98.3 F (36.8 C) 97.8 F (36.6 C) 98.7 F (37.1 C)  TempSrc: Oral Oral Oral Oral  SpO2: 100% 98% 99% 98%    Intake/Output Summary (Last 24 hours) at 11/12/2020 1413 Last data filed at 11/12/2020 0012 Gross per 24 hour  Intake 2015.83 ml  Output -  Net 2015.83 ml   Exam: General: No acute distress. Obese. Cardiovascular: Heart sounds show a regular rate, and rhythm. No gallops or rubs. No murmurs. No JVD. Lungs: Clear to auscultation bilaterally with good air movement. No rales, rhonchi or wheezes. Abdomen: Soft, nontender, nondistended with normal active bowel sounds. No masses. No hepatosplenomegaly. Neurological: Alert and  oriented 2. Left facial droop, mild dysarthria.  Mild left hemiparesis (not new per reports). Skin: Warm and dry. No rashes or lesions. Extremities: No clubbing or cyanosis. No edema. Pedal pulses 2+. Psychiatric: Mood and affect are normal. Insight and judgment are mildly impaired.   Data Reviewed:   I have personally reviewed following labs and imaging studies:  Labs: Labs show the following:   Basic Metabolic Panel: Recent Labs  Lab 11/05/20 1806 11/05/20 1813 11/06/20 0458 11/09/20 0325  11/10/20 1953 11/10/20 2017 11/11/20 0156 11/12/20 0433  NA 138   < > 142 138 138 140  --  139  K 4.1   < > 3.8 4.1 4.5 4.5  --  3.8  CL 102   < > 104 102 100 104  --  104  CO2 24  --  24 25 22   --   --  25  GLUCOSE 122*   < > 123* 114* 103* 101*  --  102*  BUN 14   < > 14 13 20  23*  --  15  CREATININE 1.39*   < > 1.11* 0.87 1.30* 1.20* 1.03* 0.90  CALCIUM 9.3  --  9.0 8.6* 9.5  --   --  8.9   < > = values in this interval not displayed.   GFR Estimated Creatinine Clearance: 84.2 mL/min (by C-G formula based on SCr of 0.9 mg/dL). Liver Function Tests: Recent Labs  Lab 11/05/20 1806 11/06/20 0458 11/10/20 1953  AST 20 15 39  ALT 12 11 22   ALKPHOS 61 55 74  BILITOT 0.7 0.6 0.8  PROT 6.5 6.0* 8.1  ALBUMIN 3.5 3.3* 4.2   Recent Labs  Lab 11/06/20 0614  LIPASE 27   Coagulation profile Recent Labs  Lab 11/05/20 1806 11/10/20 1953  INR 0.9 1.0    CBC: Recent Labs  Lab 11/05/20 1806 11/05/20 1813 11/10/20 1953 11/10/20 2017 11/11/20 0156 11/12/20 0433  WBC 8.1  --  13.8*  --  12.6* 9.5  NEUTROABS 5.6  --  10.6*  --   --   --   HGB 13.9 14.6 15.3* 16.7* 14.4 13.9  HCT 41.7 43.0 49.1* 49.0* 45.6 43.5  MCV 92.5  --  95.5  --  94.6 93.5  PLT 159  --  259  --  247 226   CBG: Recent Labs  Lab 11/11/20 0604 11/11/20 1114 11/11/20 2119 11/11/20 2347 11/12/20 0611  GLUCAP 94 105* 135* 116* 96    Microbiology Recent Results (from the past 240 hour(s))  Resp Panel by RT-PCR (Flu A&B, Covid) Nasopharyngeal Swab     Status: None   Collection Time: 11/05/20 10:09 PM   Specimen: Nasopharyngeal Swab; Nasopharyngeal(NP) swabs in vial transport medium  Result Value Ref Range Status   SARS Coronavirus 2 by RT PCR NEGATIVE NEGATIVE Final    Comment: (NOTE) SARS-CoV-2 target nucleic acids are NOT DETECTED.  The SARS-CoV-2 RNA is generally detectable in upper respiratory specimens during the acute phase of infection. The lowest concentration of SARS-CoV-2 viral  copies this assay can detect is 138 copies/mL. A negative result does not preclude SARS-Cov-2 infection and should not be used as the sole basis for treatment or other patient management decisions. A negative result may occur with  improper specimen collection/handling, submission of specimen other than nasopharyngeal swab, presence of viral mutation(s) within the areas targeted by this assay, and inadequate number of viral copies(<138 copies/mL). A negative result must be combined with clinical observations, patient history, and epidemiological  information. The expected result is Negative.  Fact Sheet for Patients:  BloggerCourse.com  Fact Sheet for Healthcare Providers:  SeriousBroker.it  This test is no t yet approved or cleared by the [place] FDA and  has been authorized for detection and/or diagnosis of SARS-CoV-2 by FDA under an Emergency Use Authorization (EUA). This EUA will remain  in effect (meaning this test can be used) for the duration of the COVID-19 declaration under Section 564(b)(1) of the Act, 21 U.S.C.section 360bbb-3(b)(1), unless the authorization is terminated  or revoked sooner.       Influenza A by PCR NEGATIVE NEGATIVE Final   Influenza B by PCR NEGATIVE NEGATIVE Final    Comment: (NOTE) The Xpert Xpress SARS-CoV-2/FLU/RSV plus assay is intended as an aid in the diagnosis of influenza from Nasopharyngeal swab specimens and should not be used as a sole basis for treatment. Nasal washings and aspirates are unacceptable for Xpert Xpress SARS-CoV-2/FLU/RSV testing.  Fact Sheet for Patients: BloggerCourse.com  Fact Sheet for Healthcare Providers: SeriousBroker.it  This test is not yet approved or cleared by the [place] FDA and has been authorized for detection and/or diagnosis of SARS-CoV-2 by FDA under an Emergency Use Authorization (EUA). This  EUA will remain in effect (meaning this test can be used) for the duration of the COVID-19 declaration under Section 564(b)(1) of the Act, 21 U.S.C. section 360bbb-3(b)(1), unless the authorization is terminated or revoked.  Performed at [facility], [address], [place], [place] [address]   SARS CORONAVIRUS 2 (TAT 6-24 HRS) Nasopharyngeal Nasopharyngeal Swab     Status: None   Collection Time: 11/10/20 11:43 PM   Specimen: Nasopharyngeal Swab  Result Value Ref Range Status   SARS Coronavirus 2 NEGATIVE NEGATIVE Final    Comment: (NOTE) SARS-CoV-2 target nucleic acids are NOT DETECTED.  The SARS-CoV-2 RNA is generally detectable in upper and lower respiratory specimens during the acute phase of infection. Negative results do not preclude SARS-CoV-2 infection, do not rule out co-infections with other pathogens, and should not be used as the sole basis for treatment or other patient management decisions. Negative results must be combined with clinical observations, patient history, and epidemiological information. The expected result is Negative.  Fact Sheet for Patients: HairSlick.no  Fact Sheet for Healthcare Providers: quierodirigir.com  This test is not yet approved or cleared by the [place] FDA and  has been authorized for detection and/or diagnosis of SARS-CoV-2 by FDA under an Emergency Use Authorization (EUA). This EUA will remain  in effect (meaning this test can be used) for the duration of the COVID-19 declaration under Se ction 564(b)(1) of the Act, 21 U.S.C. section 360bbb-3(b)(1), unless the authorization is terminated or revoked sooner.  Performed at [facility], [address] [place], [place] [address]     Procedures and diagnostic studies:  CT Code Stroke CTA Head W/WO contrast  Result Date: 11/10/2020 CLINICAL DATA:  Stroke code. Focal neuro deficit, greater than 6 hours,  stroke suspected. Aphasia, slurred speech, blurry vision. EXAM: CT ANGIOGRAPHY HEAD AND NECK TECHNIQUE: Multidetector CT imaging of the head and neck was performed using the standard protocol during bolus administration of intravenous contrast. Multiplanar CT image reconstructions and MIPs were obtained to evaluate the vascular anatomy. Carotid stenosis measurements (when applicable) are obtained utilizing NASCET criteria, using the distal internal carotid diameter as the denominator. CONTRAST:  Administered contrast not known at this time. COMPARISON:  Noncontrast head CT, CT angiogram head/neck and CT perfusion 11/05/2020.  FINDINGS: CTA NECK FINDINGS Aortic arch: Common origin of the innominate and left common carotid arteries. Minimal calcified plaque within the visualized aortic arch. Streak artifact from a dense left-sided contrast bolus partially obscures the left subclavian artery. Within this limitation, there is no appreciable hemodynamically significant innominate or proximal subclavian artery stenosis. Right carotid system: CCA and ICA patent within the neck without significant stenosis (50% or greater). Mild calcified plaque within the proximal ICA. Left carotid system: CCA and ICA patent within the neck without significant stenosis (50% or greater). Mild calcified plaque within the proximal ICA. Vertebral arteries: Patent within the neck bilaterally. Moderate stenosis within the proximal V1 right vertebral artery. Skeleton: No acute bony abnormality or aggressive osseous lesion. Cervical spondylosis with prominent multilevel ventral osteophytes. Other neck: No neck mass or cervical lymphatic Upper chest: 6 mm nodule within the left upper lobe (series 8, image 323). Review of the MIP images confirms the above findings CTA HEAD FINDINGS Anterior circulation: The intracranial internal carotid arteries are patent. Calcified plaque within both vessels with no more than mild stenosis. The M1 middle cerebral  arteries are patent. No M2 proximal branch occlusion or high-grade proximal stenosis is identified. The anterior cerebral arteries are patent. No intracranial aneurysm is identified. Posterior circulation: The intracranial vertebral arteries are patent. The basilar artery is patent. The posterior cerebral arteries are patent. Posterior communicating arteries are present bilaterally. Venous sinuses: Within the limitations of contrast timing, no convincing thrombus. Anatomic variants: As described Review of the MIP images confirms the above findings These results were communicated to Dr. Amada Jupiter At 8:37 pmon 1/6/2022by text page via the [facility] messaging system. IMPRESSION: CTA neck: 1. The common and internal carotid arteries are patent within the neck. Mild calcified plaque within the proximal ICAs bilaterally. 2. Vertebral arteries patent within the neck. Moderate stenosis within the proximal V1 right vertebral artery. 3. 6 mm left upper lobe pulmonary nodule. Non-contrast chest CT at 6-12 months is recommended. If the nodule is stable at time of repeat CT, then future CT at 18-24 months (from today's scan) is considered optional for low-risk patients, but is recommended for high-risk patients. This recommendation follows the consensus statement: Guidelines for Management of Incidental Pulmonary Nodules Detected on CT Images: From the Fleischner Society 2017; Radiology 2017; 284:228-243. CTA head: 1. No intracranial large vessel occlusion or proximal high-grade arterial stenosis. 2. Calcified plaque within the intracranial internal carotid arteries with no more than mild stenosis. Electronically Signed   By: Jackey Loge DO   On: 11/10/2020 20:38   CT Code Stroke CTA Neck W/WO contrast  Result Date: 11/10/2020 CLINICAL DATA:  Stroke code. Focal neuro deficit, greater than 6 hours, stroke suspected. Aphasia, slurred speech, blurry vision. EXAM: CT ANGIOGRAPHY HEAD AND NECK TECHNIQUE: Multidetector CT imaging of  the head and neck was performed using the standard protocol during bolus administration of intravenous contrast. Multiplanar CT image reconstructions and MIPs were obtained to evaluate the vascular anatomy. Carotid stenosis measurements (when applicable) are obtained utilizing NASCET criteria, using the distal internal carotid diameter as the denominator. CONTRAST:  Administered contrast not known at this time. COMPARISON:  Noncontrast head CT, CT angiogram head/neck and CT perfusion 11/05/2020. FINDINGS: CTA NECK FINDINGS Aortic arch: Common origin of the innominate and left common carotid arteries. Minimal calcified plaque within the visualized aortic arch. Streak artifact from a dense left-sided contrast bolus partially obscures the left subclavian artery. Within this limitation, there is no appreciable hemodynamically significant innominate or proximal subclavian artery  stenosis. Right carotid system: CCA and ICA patent within the neck without significant stenosis (50% or greater). Mild calcified plaque within the proximal ICA. Left carotid system: CCA and ICA patent within the neck without significant stenosis (50% or greater). Mild calcified plaque within the proximal ICA. Vertebral arteries: Patent within the neck bilaterally. Moderate stenosis within the proximal V1 right vertebral artery. Skeleton: No acute bony abnormality or aggressive osseous lesion. Cervical spondylosis with prominent multilevel ventral osteophytes. Other neck: No neck mass or cervical lymphatic Upper chest: 6 mm nodule within the left upper lobe (series 8, image 323). Review of the MIP images confirms the above findings CTA HEAD FINDINGS Anterior circulation: The intracranial internal carotid arteries are patent. Calcified plaque within both vessels with no more than mild stenosis. The M1 middle cerebral arteries are patent. No M2 proximal branch occlusion or high-grade proximal stenosis is identified. The anterior cerebral arteries  are patent. No intracranial aneurysm is identified. Posterior circulation: The intracranial vertebral arteries are patent. The basilar artery is patent. The posterior cerebral arteries are patent. Posterior communicating arteries are present bilaterally. Venous sinuses: Within the limitations of contrast timing, no convincing thrombus. Anatomic variants: As described Review of the MIP images confirms the above findings These results were communicated to Dr. Amada JupiterKirkpatrick At 8:37 pmon 1/6/2022by text page via the [facility] messaging system. IMPRESSION: CTA neck: 1. The common and internal carotid arteries are patent within the neck. Mild calcified plaque within the proximal ICAs bilaterally. 2. Vertebral arteries patent within the neck. Moderate stenosis within the proximal V1 right vertebral artery. 3. 6 mm left upper lobe pulmonary nodule. Non-contrast chest CT at 6-12 months is recommended. If the nodule is stable at time of repeat CT, then future CT at 18-24 months (from today's scan) is considered optional for low-risk patients, but is recommended for high-risk patients. This recommendation follows the consensus statement: Guidelines for Management of Incidental Pulmonary Nodules Detected on CT Images: From the Fleischner Society 2017; Radiology 2017; 284:228-243. CTA head: 1. No intracranial large vessel occlusion or proximal high-grade arterial stenosis. 2. Calcified plaque within the intracranial internal carotid arteries with no more than mild stenosis. Electronically Signed   By: Jackey LogeKyle  Golden DO   On: 11/10/2020 20:38   MR BRAIN WO CONTRAST  Result Date: 11/11/2020 CLINICAL DATA:  Acute neuro deficit.  Recent stroke. EXAM: MRI HEAD WITHOUT CONTRAST TECHNIQUE: Multiplanar, multiecho pulse sequences of the brain and surrounding structures were obtained without intravenous contrast. COMPARISON:  MRI head 11/05/2020.  CT head 11/10/2020 FINDINGS: Brain: Limited study. Axial diffusion-weighted imaging is  diagnostic. Motion on the coronal diffusion. The patient refused further imaging. Subacute infarct in the left posterior midbrain unchanged from the prior study. Acute infarct in the right thalamus has progressed since the prior MRI. In addition, there are multiple new small acute infarcts throughout both cerebral hemispheres primarily involving the subcortical white matter. IMPRESSION: Very limited study. Progression of right thalamic infarct. Multiple new small white matter infarcts throughout both cerebral hemispheres suggestive of emboli. Electronically Signed   By: Marlan Palauharles  Clark M.D.   On: 11/11/2020 11:50   DG CHEST PORT 1 VIEW  Result Date: 11/11/2020 CLINICAL DATA:  Stroke EXAM: PORTABLE CHEST 1 VIEW COMPARISON:  None. FINDINGS: The lungs are symmetrically expanded. Mild right basilar atelectasis or infiltrate. No pneumothorax or pleural effusion. Cardiac size within normal limits. The pulmonary vascularity is normal. No acute bone abnormality. IMPRESSION: Mild right basilar atelectasis or infiltrate. Electronically Signed   By: Helyn NumbersAshesh  Parikh  MD   On: 11/11/2020 01:48   CT HEAD CODE STROKE WO CONTRAST  Result Date: 11/10/2020 CLINICAL DATA:  Code stroke. Neuro deficit, acute, stroke suspected. Stroke, aphasia, slurred speech, blurry vision, last known well 1900 EXAM: CT HEAD WITHOUT CONTRAST TECHNIQUE: Contiguous axial images were obtained from the base of the skull through the vertex without intravenous contrast. COMPARISON:  Brain MRI 11/05/2020. CT angiogram head/neck 11/05/2020, head CT 11/05/2020. FINDINGS: Brain: Cerebral volume is normal. A known subacute infarct within the posterior left midbrain was better appreciated on the brain MRI of 11/05/2020 (acute at that time). Redemonstrated subacute infarct within the ventral right thalamus. Redemonstrated chronic lacunar infarct within the right pons. Minimal ill-defined hypoattenuation within the cerebral white matter is nonspecific, but  compatible with chronic small vessel ischemic disease. There is no acute intracranial hemorrhage. No demarcated cortical infarct. No extra-axial fluid collection. No evidence of intracranial mass. No midline shift. Vascular: No hyperdense vessel. Skull: Normal. Negative for fracture or focal lesion. Sinuses/Orbits: Visualized orbits show no acute finding. Small left maxillary sinus mucous retention cysts. ASPECTS ([place] Stroke Program Early CT Score) - Ganglionic level infarction (caudate, lentiform nuclei, internal capsule, insula, M1-M3 cortex): 7 - Supraganglionic infarction (M4-M6 cortex): 3 Total score (0-10 with 10 being normal): 10 These results were called by telephone at the time of interpretation on 11/10/2020 at 8:15 pm to provider Dr. Amada Jupiter, who verbally acknowledged these results. IMPRESSION: A known small subacute infarct within the posterior left midbrain was better appreciated on the prior MRI of 11/05/2020. Redemonstrated small subacute infarct within the ventral right thalamus. No CT evidence of interval acute intracranial abnormality. Redemonstrated chronic infarct within the right pons. Stable minimal cerebral white matter chronic small vessel ischemic disease. Electronically Signed   By: Jackey Loge DO   On: 11/10/2020 20:16    Medications:   .  stroke: mapping our early stages of recovery book   Does not apply Once  . amLODipine  5 mg Oral Daily  . aspirin  300 mg Rectal Daily   Or  . aspirin  325 mg Oral Daily  . atorvastatin  40 mg Oral Daily  . clopidogrel  75 mg Oral Daily  . enoxaparin (LOVENOX) injection  40 mg Subcutaneous Q24H  . FLUoxetine  10 mg Oral Daily  . gabapentin  100 mg Oral BID  . multivitamin with minerals  1 tablet Oral Daily  . OLANZapine  5 mg Oral QHS  . pantoprazole  40 mg Oral Daily   Continuous Infusions:   LOS: 1 day   Hillery Aldo, MD  [facility]   [facility] How to contact the [facility] Attending or Consulting provider  7A - 7P or covering provider during after hours 7P -7A, for this patient?  1. Check the care team in [facility] and look for a) attending/consulting [facility] provider listed and b) the [facility] team listed 2. Log into [email or web address] and use [place]'s universal password to access. If you do not have the password, please contact the hospital operator. 3. Locate the [facility] provider you are looking for under [facility] and page to a number that you can be directly reached. 4. If you still have difficulty reaching the provider, please page the [facility] (Director on Call) for the Hospitalists listed on amion for assistance.  11/12/2020, 2:13 PM

## 2020-11-12 NOTE — Progress Notes (Signed)
Physical Therapy Treatment Patient Details Name: Cathy Hall MRN: [id number] DOB: [birth year] Today's Date: 11/12/2020    History of Present Illness Pt is a 50 y/o female admitted secondary to worsening L sided weakness, difficulty managing secretions and dysarthria. Thought to be secondary to extension of recent CVA. Recently discharged secondary to CVA. PMH inclues CVA, COPD, drug abuse, PTSD.    PT Comments    The pt was able to make good progress with mobility and PT goals at this time, despite reports of ongoing dizziness and fatigue. The pt was able to complete multiple sit-stand transfers and hallway ambulation with minA/minG for stability and safety. The pt requires frequent standing and seated rest breaks, as well as repeated verbal cues for technique and eccentric lower to chair. The pt will continue to benefit from skilled PT to address functional endurance, strength, and dynamic stability to reduce risk of fall and injury.     Follow Up Recommendations  SNF;Supervision/Assistance - 24 hour     Equipment Recommendations  Rolling walker with 5" wheels    Recommendations for Other Services       Precautions / Restrictions Precautions Precautions: Fall Precaution Comments: mildly impulsive Restrictions Weight Bearing Restrictions: No    Mobility  Bed Mobility Overal bed mobility: Needs Assistance Bed Mobility: Supine to Sit     Supine to sit: Min guard     General bed mobility comments: increased time and effort, no assist given  Transfers Overall transfer level: Needs assistance Equipment used: Rolling walker (2 wheeled) Transfers: Sit to/from Stand Sit to Stand: Min guard         General transfer comment: minG, but significant verbal cues. completed x 10 from chair in room without use of UE to stand, but requires repeated verbal cues for use of hands to safely lower down despite number of attempts through  session.  Ambulation/Gait Ambulation/Gait assistance: Min guard Gait Distance (Feet): 50 Feet (+ 2 x 10) Assistive device: Rolling walker (2 wheeled) Gait Pattern/deviations: Step-through pattern;Decreased stride length;Shuffle Gait velocity: decreased Gait velocity interpretation: <1.31 ft/sec, indicative of household ambulator General Gait Details: minimal clearance bilaterally with variation in step length and widths. minG for balance, requiring seated rest after hallway ambulation      Modified Rankin (Stroke Patients Only) Modified Rankin (Stroke Patients Only) Pre-Morbid Rankin Score: Slight disability Modified Rankin: Moderately severe disability     Balance Overall balance assessment: Needs assistance Sitting-balance support: Feet supported;No upper extremity supported Sitting balance-Leahy Scale: Fair     Standing balance support: Bilateral upper extremity supported Standing balance-Leahy Scale: Fair Standing balance comment: reliant on external support                            Cognition Arousal/Alertness: Awake/alert Behavior During Therapy: WFL for tasks assessed/performed Overall Cognitive Status: No family/caregiver present to determine baseline cognitive functioning Area of Impairment: Problem solving;Attention;Safety/judgement;Awareness                   Current Attention Level: Sustained     Safety/Judgement: Decreased awareness of deficits;Decreased awareness of safety Awareness: Anticipatory Problem Solving: Slow processing;Requires verbal cues;Requires tactile cues General Comments: cues for safety and technique with mobility, cues for energy conservation and self-monitoring. decreased insight to deficits      Exercises General Exercises - Lower Extremity Long Arc Quad: AROM;Strengthening;Both;10 reps;Seated (x10 AROM, x10 against min resistance) Mini-Sqauts: Strengthening;5 reps Other Exercises Other Exercises: sit-stand from  chair x10  without UE to power up but with use of BUE to control eccentric lower    General Comments General comments (skin integrity, edema, etc.): pt reports dizziness, VSS on RA follwoing standing and ambulation.      Pertinent Vitals/Pain Pain Assessment: No/denies pain Pain Intervention(s): Monitored during session;Limited activity within patient's tolerance           PT Goals (current goals can now be found in the care plan section) Acute Rehab PT Goals Patient Stated Goal: to be more independent PT Goal Formulation: With patient Time For Goal Achievement: 11/25/20 Potential to Achieve Goals: Fair Progress towards PT goals: Progressing toward goals    Frequency    Min 3X/week      PT Plan Current plan remains appropriate       AM-PAC PT "6 Clicks" Mobility   Outcome Measure  Help needed turning from your back to your side while in a flat bed without using bedrails?: A Little Help needed moving from lying on your back to sitting on the side of a flat bed without using bedrails?: A Lot Help needed moving to and from a bed to a chair (including a wheelchair)?: A Lot Help needed standing up from a chair using your arms (e.g., wheelchair or bedside chair)?: A Lot Help needed to walk in hospital room?: A Lot Help needed climbing 3-5 steps with a railing? : Total 6 Click Score: 12    End of Session Equipment Utilized During Treatment: Gait belt Activity Tolerance: Patient tolerated treatment well Patient left: in bed;with call bell/phone within reach;with family/visitor present (sitting EOB, visitor asleep in chair) Nurse Communication: Mobility status PT Visit Diagnosis: Unsteadiness on feet (R26.81);Muscle weakness (generalized) (M62.81)     Time: 1607-3710 PT Time Calculation (min) (ACUTE ONLY): 18 min  Charges:  $Gait Training: 8-22 mins                     Rolm Baptise, PT, DPT   Acute Rehabilitation Department Pager #: [phone]   [name] 11/12/2020, 3:27 PM

## 2020-11-12 NOTE — Progress Notes (Signed)
Occupational Therapy Evaluation Patient Details Name: Cathy Hall MRN: [id number] DOB: 1971-01-13 Today's Date: 11/12/2020    History of Present Illness Pt is a 50 y/o female admitted secondary to worsening L sided weakness, difficulty managing secretions and dysarthria. Thought to be secondary to extension of recent CVA. Recently discharged secondary to CVA. PMH inclues CVA, COPD, drug abuse, PTSD.   Clinical Impression   Pt presenting with deficits listed below, currently continues with LUE mild weakness and coordination deficits, impairments in balance and ovearall activity tolerance leading to need for increased S/A for safety with ADL's and transfers. Limited assist at home as boyfriend/significant other works during the day. OT with recommendations listed below to maximize safety and functional outcomes and decrease pt's risk for rehospitalization. OT will continue to follow acutely.     Follow Up Recommendations  SNF;Supervision/Assistance - 24 hour    Equipment Recommendations  Other (comment) (TBD pending plans for d/c, currently has RW and BSC)    Recommendations for Other Services       Precautions / Restrictions Precautions Precautions: Fall Restrictions Weight Bearing Restrictions: No      Mobility Bed Mobility Overal bed mobility: Needs Assistance Bed Mobility: Supine to Sit;Sit to Supine     Supine to sit: Min guard;HOB elevated Sit to supine: Supervision   General bed mobility comments: HOB elevated, increased time    Transfers Overall transfer level: Needs assistance Equipment used: Rolling walker (2 wheeled) Transfers: Sit to/from Stand Sit to Stand: Min guard         General transfer comment: Min A for transition to and from standing with Ue support on RW, slight sway but improved with x3 trials from EOB. decreased insight and balance leading to limited transfer/assesment during evaluation    Balance Overall balance assessment:  Needs assistance Sitting-balance support: Feet supported;No upper extremity supported Sitting balance-Leahy Scale: Fair     Standing balance support: Bilateral upper extremity supported Standing balance-Leahy Scale: Fair                             ADL either performed or assessed with clinical judgement   ADL Overall ADL's : Needs assistance/impaired Eating/Feeding: Set up;Sitting Eating/Feeding Details (indicate cue type and reason): upright in bed Grooming: Set up;Supervision/safety;Bed level           Upper Body Dressing : Minimal assistance;Sitting Upper Body Dressing Details (indicate cue type and reason): EOB Lower Body Dressing: Minimal assistance   Toilet Transfer: RW;Minimal assistance   Toileting- Clothing Manipulation and Hygiene: Minimal assistance;Cueing for safety;Cueing for sequencing;Cueing for compensatory techniques;Sit to/from stand         General ADL Comments: decreased safety awareness, observed with trunk sway and slight OB to L requiring intervention from therapist for righting and recovery during standing tasks with UE support on RW     Vision Baseline Vision/History: No visual deficits Patient Visual Report: No change from baseline Vision Assessment?: No apparent visual deficits Additional Comments: glasses donned, vague if she noticed changes in vision     Perception     Praxis      Pertinent Vitals/Pain Pain Assessment: 0-10 Pain Score: 2  Pain Location: HA Pain Intervention(s): Repositioned     Hand Dominance Right   Extremity/Trunk Assessment Upper Extremity Assessment Upper Extremity Assessment: LUE deficits/detail LUE Deficits / Details: grossly -4/5 with inconsistent presentation with formal testing slight break during MMT, but use of LUE observed with transfer and repositioning  in bed. LUE Sensation: decreased light touch LUE Coordination: decreased fine motor   Lower Extremity Assessment Lower Extremity  Assessment: Defer to PT evaluation   Cervical / Trunk Assessment Cervical / Trunk Assessment: Normal   Communication Communication Communication: No difficulties   Cognition Arousal/Alertness: Awake/alert Behavior During Therapy: WFL for tasks assessed/performed Overall Cognitive Status: No family/caregiver present to determine baseline cognitive functioning Area of Impairment: Problem solving;Attention;Safety/judgement;Awareness                   Current Attention Level: Sustained     Safety/Judgement: Decreased awareness of deficits;Decreased awareness of safety Awareness: Anticipatory Problem Solving: Slow processing;Requires verbal cues;Requires tactile cues General Comments: cues required with task completion, decreased safety awareness and insight to deficits   General Comments       Exercises     Shoulder Instructions      Home Living Family/patient expects to be discharged to:: Private residence Living Arrangements: Other relatives;Spouse/significant other Available Help at Discharge: Family;Available PRN/intermittently Type of Home: House Home Access: Level entry     Home Layout: Two level Alternate Level Stairs-Number of Steps: flight Alternate Level Stairs-Rails: Right Bathroom Shower/Tub: Producer, television/film/video: Standard     Home Equipment: Environmental consultant - 2 wheels;Bedside commode          Prior Functioning/Environment Level of Independence: Independent with assistive device(s)        Comments: Had been using RW for mobility since d/c from the hospital        OT Problem List: Decreased strength;Decreased activity tolerance;Impaired balance (sitting and/or standing);Impaired vision/perception;Decreased coordination;Decreased cognition;Decreased safety awareness;Impaired sensation;Impaired UE functional use;Obesity      OT Treatment/Interventions: Self-care/ADL training;Neuromuscular education;DME and/or AE instruction;Therapeutic  activities;Visual/perceptual remediation/compensation;Patient/family education;Balance training    OT Goals(Current goals can be found in the care plan section) Acute Rehab OT Goals Patient Stated Goal: to be more indep OT Goal Formulation: With patient Time For Goal Achievement: 11/26/20 Potential to Achieve Goals: Good  OT Frequency: Min 2X/week   Barriers to D/C: Decreased caregiver support          Co-evaluation              AM-PAC OT "6 Clicks" Daily Activity     Outcome Measure Help from another person eating meals?: None Help from another person taking care of personal grooming?: A Little Help from another person toileting, which includes using toliet, bedpan, or urinal?: A Lot Help from another person bathing (including washing, rinsing, drying)?: A Little Help from another person to put on and taking off regular upper body clothing?: A Little Help from another person to put on and taking off regular lower body clothing?: A Little 6 Click Score: 18   End of Session Equipment Utilized During Treatment: Gait belt;Rolling walker Nurse Communication: Mobility status  Activity Tolerance: Patient tolerated treatment well Patient left: in bed;with call bell/phone within reach;with bed alarm set  OT Visit Diagnosis: Unsteadiness on feet (R26.81) Hemiplegia - Right/Left: Left Hemiplegia - dominant/non-dominant: Non-Dominant Hemiplegia - caused by: Cerebral infarction                Time: 1191-4782 OT Time Calculation (min): 24 min Charges:  OT General Charges $OT Visit: 1 Visit OT Evaluation $OT Eval Low Complexity: 1 Low  Garry Nicolini OTR/L acute rehab services Office: [phone]    [employer] Carsyn Boster 11/12/2020, 10:23 AM

## 2020-11-12 NOTE — Social Work (Cosign Needed)
RE: Cathy Hall. Dukes- [name]  Date of Birth:  [birth year]  Date: 11/12/2020  To Whom It May Concern:  Please be advised that the above-named patient will require a short-term nursing home stay - anticipated 30 days or less for rehabilitation and strengthening.  The plan is for return home.

## 2020-11-12 NOTE — TOC Initial Note (Signed)
Transition of Care [facility] - Initial/Assessment Note    Patient Details  Name: Cathy Hall MRN: [id number] Date of Birth: [birth year]  Transition of [facility]) CM/SW Contact:    Carley Hammed, LCSWA Phone Number: 11/12/2020, 2:10 PM  Clinical Narrative:                 CSW spoke with pt at bedside and explained SNF recommendation and process. Pt notes that she does not have insurance and has only had one covid shot, that she was given here at the hospital. She has no preference for facility, but would like to remain in [place]. She asked that CSW contact her son Cathy Hall for an update. Pt's son is also agreeable and has no preference for placement. CSW will complete workup and fax pt out. Pt will be an LOG, as medicaid is still pending. SW will continue to follow for DC planning.  Expected Discharge Plan: Skilled Nursing Facility Barriers to Discharge: Inadequate or no insurance,Other (comment) (Only one covid shot)   Patient Goals and CMS Choice Patient states their goals for this hospitalization and ongoing recovery are:: Pt states she guesses she has to go toSNF. CMS Medicare.gov Compare Post Acute Care list provided to:: Patient Choice offered to / list presented to : Patient  Expected Discharge Plan and Services Expected Discharge Plan: Skilled Nursing Facility     Post Acute Care Choice: Skilled Nursing Facility Living arrangements for the past 2 months: Single Family Home                                      Prior Living Arrangements/Services Living arrangements for the past 2 months: Single Family Home Lives with:: Self Patient language and need for interpreter reviewed:: Yes Do you feel safe going back to the place where you live?: Yes      Need for Family Participation in Patient Care: Yes (Comment) Care giver support system in place?: Yes (comment)   Criminal Activity/Legal Involvement Pertinent to Current Situation/Hospitalization: No -  Comment as needed  Activities of Daily Living      Permission Sought/Granted Permission sought to share information with : Family Supports Permission granted to share information with : Yes, Verbal Permission Granted  Share Information with NAME: Mariel Craft     Permission granted to share info w Relationship: Son  Permission granted to share info w Contact Information: [phone]  Emotional Assessment Appearance:: Appears stated age Attitude/Demeanor/Rapport: Engaged Affect (typically observed): Appropriate Orientation: : Oriented to Self,Oriented to Place,Oriented to  Time,Oriented to Situation Alcohol / Substance Use: Not Applicable Psych Involvement: No (comment)  Admission diagnosis:  Dysarthria [R47.1] Left-sided weakness [R53.1] Aspiration into airway [T17.908A] Acute CVA (cerebrovascular accident) [facility]) [I63.9] Patient Active Problem List   Diagnosis Date Noted  . ARF (acute renal failure) (HCC) 11/11/2020  . Acute CVA (cerebrovascular accident) (HCC) 11/10/2020  . Ischemic stroke (HCC) 11/06/2020  . Epigastric pain 11/05/2020  . Palpitations 11/05/2020  . Essential hypertension 11/05/2020  . Cocaine use disorder, moderate, dependence (HCC) 08/09/2020  . Cannabis use disorder, moderate, dependence (HCC) 08/09/2020  . Severe episode of recurrent major depressive disorder, without psychotic features (HCC)    PCP:  Patient, No Pcp Per Pharmacy:   [facility] - [place], [place] - [address] [place] [address] Phone: [phone] Fax: [phone]  [id number] - [place], [place] -  [address] AT [facility] [address] [place] [address] Phone: [phone] Fax: [phone]  [facility] [place], [place] - [address] [place] [address] Phone: [phone] Fax: [phone]     Social Determinants of  Health (SDOH) Interventions    Readmission Risk Interventions No flowsheet data found.

## 2020-11-12 NOTE — Plan of Care (Signed)
  Problem: Clinical Measurements: Goal: Ability to maintain clinical measurements within normal limits will improve Outcome: Progressing Goal: Will remain free from infection Outcome: Progressing Goal: Diagnostic test results will improve Outcome: Progressing Goal: Respiratory complications will improve Outcome: Progressing Goal: Cardiovascular complication will be avoided Outcome: Progressing   Problem: Coping: Goal: Level of anxiety will decrease Outcome: Progressing   Problem: Elimination: Goal: Will not experience complications related to bowel motility Outcome: Progressing Goal: Will not experience complications related to urinary retention Outcome: Progressing   Problem: Safety: Goal: Ability to remain free from injury will improve Outcome: Progressing   Problem: Skin Integrity: Goal: Risk for impaired skin integrity will decrease Outcome: Progressing

## 2020-11-12 NOTE — NC FL2 (Signed)
[place] MEDICAID FL2 LEVEL OF CARE SCREENING TOOL     IDENTIFICATION  Patient Name: Cathy Hall Birthdate: 17-Oct-1971 Sex: female Admission Date (Current Location): 11/10/2020  [facility] and [place] Number:  Producer, television/film/video and Address:  [facility], [address] [place], [place] [address]      Provider Number: [id number]  Attending Physician Name and Address:  Rama, Maryruth Bun, MD  Relative Name and Phone Number:  Mariel Craft [phone]    Current Level of Care: SNF Recommended Level of Care: Skilled Nursing Facility Prior Approval Number:    Date Approved/Denied:   PASRR Number:    Discharge Plan: SNF    Current Diagnoses: Patient Active Problem List   Diagnosis Date Noted  . ARF (acute renal failure) (HCC) 11/11/2020  . Acute CVA (cerebrovascular accident) (HCC) 11/10/2020  . Ischemic stroke (HCC) 11/06/2020  . Epigastric pain 11/05/2020  . Palpitations 11/05/2020  . Essential hypertension 11/05/2020  . Cocaine use disorder, moderate, dependence (HCC) 08/09/2020  . Cannabis use disorder, moderate, dependence (HCC) 08/09/2020  . Severe episode of recurrent major depressive disorder, without psychotic features (HCC)     Orientation RESPIRATION BLADDER Height & Weight     Self,Time,Situation,Place  Normal Continent Weight:   Height:     BEHAVIORAL SYMPTOMS/MOOD NEUROLOGICAL BOWEL NUTRITION STATUS      Continent Diet (See DC Summary)  AMBULATORY STATUS COMMUNICATION OF NEEDS Skin   Limited Assist Verbally Normal                       Personal Care Assistance Level of Assistance  Bathing,Feeding,Dressing Bathing Assistance: Limited assistance Feeding assistance: Independent Dressing Assistance: Limited assistance     Functional Limitations Info  Sight,Hearing,Speech Sight Info: Adequate Hearing Info: Adequate Speech Info: Adequate    SPECIAL CARE FACTORS FREQUENCY  PT (By licensed PT),OT (By  licensed OT)     PT Frequency: 5x week OT Frequency: 5x week            Contractures Contractures Info: Not present    Additional Factors Info  Psychotropic,Allergies,Code Status Code Status Info: Full Allergies Info: Nsaids Psychotropic Info: Prozac, Olanzipine         Current Medications (11/12/2020):  This is the current hospital active medication list Current Facility-Administered Medications  Medication Dose Route Frequency Provider Last Rate Last Admin  .  stroke: mapping our early stages of recovery book   Does not apply Once Eduard Clos, MD      . acetaminophen (TYLENOL) tablet 650 mg  650 mg Oral Q4H PRN Eduard Clos, MD       Or  . acetaminophen (TYLENOL) 160 MG/5ML solution 650 mg  650 mg Per Tube Q4H PRN Eduard Clos, MD       Or  . acetaminophen (TYLENOL) suppository 650 mg  650 mg Rectal Q4H PRN Eduard Clos, MD   650 mg at 11/11/20 0852  . albuterol (VENTOLIN HFA) 108 (90 Base) MCG/ACT inhaler 2 puff  2 puff Inhalation Q6H PRN Eduard Clos, MD      . amLODipine (NORVASC) tablet 5 mg  5 mg Oral Daily Rama, Christina P, MD      . aspirin suppository 300 mg  300 mg Rectal Daily Eduard Clos, MD   300 mg at 11/11/20 3664   Or  . aspirin tablet 325 mg  325 mg Oral Daily Eduard Clos, MD   [phone]  mg at 11/12/20 0911  . atorvastatin (LIPITOR) tablet 40 mg  40 mg Oral Daily Rama, Christina P, MD      . clopidogrel (PLAVIX) tablet 75 mg  75 mg Oral Daily Rama, Maryruth Bun, MD   75 mg at 11/12/20 1059  . enoxaparin (LOVENOX) injection 40 mg  40 mg Subcutaneous Q24H Eduard Clos, MD   40 mg at 11/12/20 0911  . FLUoxetine (PROZAC) capsule 10 mg  10 mg Oral Daily Rama, Christina P, MD      . gabapentin (NEURONTIN) capsule 100 mg  100 mg Oral BID Rama, Maryruth Bun, MD      . hydrALAZINE (APRESOLINE) injection 10 mg  10 mg Intravenous Q4H PRN Eduard Clos, MD      . hydrOXYzine (ATARAX/VISTARIL) tablet 25 mg   25 mg Oral TID PRN Rama, Maryruth Bun, MD      . multivitamin with minerals tablet 1 tablet  1 tablet Oral Daily Rama, Christina P, MD      . OLANZapine (ZYPREXA) tablet 5 mg  5 mg Oral QHS Rama, Christina P, MD      . pantoprazole (PROTONIX) EC tablet 40 mg  40 mg Oral Daily Rama, Christina P, MD      . polyvinyl alcohol (LIQUIFILM TEARS) 1.4 % ophthalmic solution 1 drop  1 drop Both Eyes Daily PRN Rama, Maryruth Bun, MD      . Resource ThickenUp Clear   Oral PRN Noralee Stain, DO         Discharge Medications: Please see discharge summary for a list of discharge medications.  Relevant Imaging Results:  Relevant Lab Results:   Additional Information SS# [id number]

## 2020-11-13 DIAGNOSIS — E66813 Obesity, class 3: Secondary | ICD-10-CM | POA: Diagnosis present

## 2020-11-13 LAB — GLUCOSE, CAPILLARY
Glucose-Capillary: 116 mg/dL — ABNORMAL HIGH (ref 70–99)
Glucose-Capillary: 174 mg/dL — ABNORMAL HIGH (ref 70–99)
Glucose-Capillary: 224 mg/dL — ABNORMAL HIGH (ref 70–99)
Glucose-Capillary: 93 mg/dL (ref 70–99)
Glucose-Capillary: 144 mg/dL — ABNORMAL HIGH (ref 70–99)

## 2020-11-13 NOTE — Progress Notes (Signed)
Progress Note    Cathy Hall  YOV:[id number] DOB: 1970-12-09  DOA: 11/10/2020 PCP: Patient, No Pcp Per    Brief Narrative:   Chief complaint: Worsening dysarthria s/p recent stroke  Medical records reviewed and are as summarized below:  Cathy Hall is an 50 y.o. female with history of COPD, cocaine abuse, hypertension just discharged today earlier after being admitted for stroke with some left-sided weakness while sitting at home experienced worsening dysarthria and also difficulty managing her secretions.  Patient was brought in as a code stroke.  Assessment/Plan:   Principal Problem:   Acute CVA (cerebrovascular accident) (HCC) associated with dysphagia, dysarthria and mild left hemiparesis -Neurologist consulted. Repeat MRI shows progression of thalamic stroke and new areas of embolic stroke. -ST evaluation performed showing moderate to severe dysphagia.  Patient does not want to comply with prescribed diet.  -Continue ASA/Plavix/statin.   -H/O cocaine abuse. 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 -Full stroke work up already completed, no new recommendations. Spoke with neurologist personally to discuss. -S/P PT/OT evaluations with SNF recommended.  TOC working on placement.  Active Problems:   Essential hypertension Systolic BP up in the 170's. Continue hydralazine PRN and Norvasc.    ARF (acute renal failure) (HCC) Baseline creatinine 0.87 with admission creatinine of 1.30, now back to baseline.    Cocaine Abuse Counsel on cessation.    Obesity RN to check height and weight.  Nutritional status: Class III Obesity  Body mass index is 42.86 kg/m.   Family Communication/Anticipated D/C date and plan/Code Status   DVT prophylaxis: enoxaparin (LOVENOX) injection 40 mg Start: 11/11/20 1000   Code Status: Full Code.  Family Communication: No family at the bedside. Disposition Plan: Status  is: Inpatient  Remains inpatient appropriate because: of persistent dysphagia.  Will need to be able to protect her airway before safe to d/c. Needs SNF placement.   Dispo: The patient is from: Home              Anticipated d/c is to: SNF              Anticipated d/c date is: 1 day              Patient currently is not medically stable to d/c.   Medical Consultants:    Neurology   Anti-Infectives:    None  Subjective:   Sleepy and difficult to awaken sufficiently to answer questions. Significant other at bedside, reports dysarthria improving and less coughing when trying to eat/drink.  Objective:    Vitals:   11/12/20 2355 11/13/20 0400 11/13/20 0725 11/13/20 0852  BP: (!) 165/91 (!) 141/81 (!) 150/79   Pulse: 76 64 71   Resp: 20 18 18    Temp: 98.3 F (36.8 C) 97.8 F (36.6 C) 97.9 F (36.6 C)   TempSrc: Oral Oral Oral   SpO2: 100% 97% 100%   Height:    5\' 1"  (1.549 m)   No intake or output data in the 24 hours ending 11/13/20 1113 Exam: General: No acute distress. Obese.  Sleeping. Cardiovascular: Heart sounds show a regular rate, and rhythm. No gallops or rubs. No murmurs. No JVD. Lungs: Clear to auscultation bilaterally with good air movement. No rales, rhonchi or wheezes. Abdomen: Soft, nontender, nondistended with normal active bowel sounds. No masses. No hepatosplenomegaly. Neurological: Sleeping. Improving dysarthria per significant other. Skin: Warm and dry. No rashes or lesions. Extremities: No clubbing or cyanosis. No edema. Pedal  pulses 2+.  Data Reviewed:   I have personally reviewed following labs and imaging studies:  Labs: Labs show the following:   Basic Metabolic Panel: Recent Labs  Lab 11/09/20 0325 11/10/20 1953 11/10/20 2017 11/11/20 0156 11/12/20 0433  NA 138 138 140  --  139  K 4.1 4.5 4.5  --  3.8  CL 102 100 104  --  104  CO2 25 22  --   --  25  GLUCOSE 114* 103* 101*  --  102*  BUN 13 20 23*  --  15  CREATININE 0.87  1.30* 1.20* 1.03* 0.90  CALCIUM 8.6* 9.5  --   --  8.9   GFR Estimated Creatinine Clearance: 84.2 mL/min (by C-G formula based on SCr of 0.9 mg/dL). Liver Function Tests: Recent Labs  Lab 11/10/20 1953  AST 39  ALT 22  ALKPHOS 74  BILITOT 0.8  PROT 8.1  ALBUMIN 4.2   Coagulation profile Recent Labs  Lab 11/10/20 1953  INR 1.0    CBC: Recent Labs  Lab 11/10/20 1953 11/10/20 2017 11/11/20 0156 11/12/20 0433  WBC 13.8*  --  12.6* 9.5  NEUTROABS 10.6*  --   --   --   HGB 15.3* 16.7* 14.4 13.9  HCT 49.1* 49.0* 45.6 43.5  MCV 95.5  --  94.6 93.5  PLT 259  --  247 226   CBG: Recent Labs  Lab 11/12/20 0611 11/12/20 1851 11/12/20 2339 11/13/20 0622 11/13/20 0726  GLUCAP 96 159* 160* 116* 174*    Microbiology Recent Results (from the past 240 hour(s))  Resp Panel by RT-PCR (Flu A&B, Covid) Nasopharyngeal Swab     Status: None   Collection Time: 11/05/20 10:09 PM   Specimen: Nasopharyngeal Swab; Nasopharyngeal(NP) swabs in vial transport medium  Result Value Ref Range Status   SARS Coronavirus 2 by RT PCR NEGATIVE NEGATIVE Final    Comment: (NOTE) SARS-CoV-2 target nucleic acids are NOT DETECTED.  The SARS-CoV-2 RNA is generally detectable in upper respiratory specimens during the acute phase of infection. The lowest concentration of SARS-CoV-2 viral copies this assay can detect is 138 copies/mL. A negative result does not preclude SARS-Cov-2 infection and should not be used as the sole basis for treatment or other patient management decisions. A negative result may occur with  improper specimen collection/handling, submission of specimen other than nasopharyngeal swab, presence of viral mutation(s) within the areas targeted by this assay, and inadequate number of viral copies(<138 copies/mL). A negative result must be combined with clinical observations, patient history, and epidemiological information. The expected result is Negative.  Fact Sheet for  Patients:  BloggerCourse.com  Fact Sheet for Healthcare Providers:  SeriousBroker.it  This test is no t yet approved or cleared by the [place] FDA and  has been authorized for detection and/or diagnosis of SARS-CoV-2 by FDA under an Emergency Use Authorization (EUA). This EUA will remain  in effect (meaning this test can be used) for the duration of the COVID-19 declaration under Section 564(b)(1) of the Act, 21 U.S.C.section 360bbb-3(b)(1), unless the authorization is terminated  or revoked sooner.       Influenza A by PCR NEGATIVE NEGATIVE Final   Influenza B by PCR NEGATIVE NEGATIVE Final    Comment: (NOTE) The Xpert Xpress SARS-CoV-2/FLU/RSV plus assay is intended as an aid in the diagnosis of influenza from Nasopharyngeal swab specimens and should not be used as a sole basis for treatment. Nasal washings and aspirates are unacceptable for Xpert Xpress SARS-CoV-2/FLU/RSV  testing.  Fact Sheet for Patients: BloggerCourse.com  Fact Sheet for Healthcare Providers: SeriousBroker.it  This test is not yet approved or cleared by the [place] FDA and has been authorized for detection and/or diagnosis of SARS-CoV-2 by FDA under an Emergency Use Authorization (EUA). This EUA will remain in effect (meaning this test can be used) for the duration of the COVID-19 declaration under Section 564(b)(1) of the Act, 21 U.S.C. section 360bbb-3(b)(1), unless the authorization is terminated or revoked.  Performed at [facility], [address] [place], [place] [address]   SARS CORONAVIRUS 2 (TAT 6-24 HRS) Nasopharyngeal Nasopharyngeal Swab     Status: None   Collection Time: 11/10/20 11:43 PM   Specimen: Nasopharyngeal Swab  Result Value Ref Range Status   SARS Coronavirus 2 NEGATIVE NEGATIVE Final    Comment: (NOTE) SARS-CoV-2 target nucleic acids are NOT DETECTED.  The  SARS-CoV-2 RNA is generally detectable in upper and lower respiratory specimens during the acute phase of infection. Negative results do not preclude SARS-CoV-2 infection, do not rule out co-infections with other pathogens, and should not be used as the sole basis for treatment or other patient management decisions. Negative results must be combined with clinical observations, patient history, and epidemiological information. The expected result is Negative.  Fact Sheet for Patients: HairSlick.no  Fact Sheet for Healthcare Providers: quierodirigir.com  This test is not yet approved or cleared by the [place] FDA and  has been authorized for detection and/or diagnosis of SARS-CoV-2 by FDA under an Emergency Use Authorization (EUA). This EUA will remain  in effect (meaning this test can be used) for the duration of the COVID-19 declaration under Se ction 564(b)(1) of the Act, 21 U.S.C. section 360bbb-3(b)(1), unless the authorization is terminated or revoked sooner.  Performed at [facility], [address] [place], [place] [address]     Procedures and diagnostic studies:  MR BRAIN WO CONTRAST  Result Date: 11/11/2020 CLINICAL DATA:  Acute neuro deficit.  Recent stroke. EXAM: MRI HEAD WITHOUT CONTRAST TECHNIQUE: Multiplanar, multiecho pulse sequences of the brain and surrounding structures were obtained without intravenous contrast. COMPARISON:  MRI head 11/05/2020.  CT head 11/10/2020 FINDINGS: Brain: Limited study. Axial diffusion-weighted imaging is diagnostic. Motion on the coronal diffusion. The patient refused further imaging. Subacute infarct in the left posterior midbrain unchanged from the prior study. Acute infarct in the right thalamus has progressed since the prior MRI. In addition, there are multiple new small acute infarcts throughout both cerebral hemispheres primarily involving the subcortical white matter.  IMPRESSION: Very limited study. Progression of right thalamic infarct. Multiple new small white matter infarcts throughout both cerebral hemispheres suggestive of emboli. Electronically Signed   By: Marlan Palau M.D.   On: 11/11/2020 11:50    Medications:   .  stroke: mapping our early stages of recovery book   Does not apply Once  . amLODipine  5 mg Oral Daily  . aspirin  300 mg Rectal Daily   Or  . aspirin  325 mg Oral Daily  . atorvastatin  40 mg Oral Daily  . clopidogrel  75 mg Oral Daily  . enoxaparin (LOVENOX) injection  40 mg Subcutaneous Q24H  . FLUoxetine  10 mg Oral Daily  . gabapentin  100 mg Oral BID  . multivitamin with minerals  1 tablet Oral Daily  . OLANZapine  5 mg Oral QHS  . pantoprazole  40 mg Oral Daily   Continuous Infusions:   LOS: 2 days  Hillery Aldo, MD  [facility]   [facility] How to contact the [facility] Attending or Consulting provider 7A - 7P or covering provider during after hours 7P -7A, for this patient?  1. Check the care team in [facility] and look for a) attending/consulting [facility] provider listed and b) the [facility] team listed 2. Log into [email or web address] and use [place]'s universal password to access. If you do not have the password, please contact the hospital operator. 3. Locate the [facility] provider you are looking for under [facility] and page to a number that you can be directly reached. 4. If you still have difficulty reaching the provider, please page the [facility] (Director on Call) for the Hospitalists listed on amion for assistance.  11/13/2020, 11:13 AM

## 2020-11-13 NOTE — Progress Notes (Signed)
SLP Cancellation Note  Patient Details Name: Cathy Hall MRN: [id number] DOB: 12/17/1970   Cancelled treatment:       Reason Eval/Treat Not Completed: Other (comment). Received new swallow eval order for this pt. Pt had MBS on 11/11/20 and SLP is still following. Per note pt does not want to follow prescribed diet. Will f/u tomorrow to address needs and potential for upgrade if appropriate.    Joycelin Radloff, Riley Nearing 11/13/2020, 1:32 PM

## 2020-11-14 LAB — GLUCOSE, CAPILLARY
Glucose-Capillary: 105 mg/dL — ABNORMAL HIGH (ref 70–99)
Glucose-Capillary: 108 mg/dL — ABNORMAL HIGH (ref 70–99)
Glucose-Capillary: 133 mg/dL — ABNORMAL HIGH (ref 70–99)

## 2020-11-14 MED ORDER — TRAMADOL HCL 50 MG PO TABS
50.0000 mg | ORAL_TABLET | Freq: Three times a day (TID) | ORAL | Status: DC | PRN
Start: 2020-11-14 — End: 2020-11-22
  Administered 2020-11-14 – 2020-11-21 (×14): 50 mg via ORAL
  Filled 2020-11-14 (×14): qty 1

## 2020-11-14 MED ORDER — ONDANSETRON HCL 4 MG/2ML IJ SOLN
4.0000 mg | Freq: Four times a day (QID) | INTRAMUSCULAR | Status: DC | PRN
  Administered 2020-11-14 – 2020-11-27 (×2): 4 mg via INTRAVENOUS
  Filled 2020-11-14 (×2): qty 2

## 2020-11-14 MED ORDER — COVID-19 MRNA VACC (MODERNA) 100 MCG/0.5ML IM SUSP
0.5000 mL | Freq: Once | INTRAMUSCULAR | Status: AC
  Administered 2020-11-15: 0.5 mL via INTRAMUSCULAR
  Filled 2020-11-14: qty 0.5

## 2020-11-14 NOTE — Progress Notes (Addendum)
PROGRESS NOTE    Cathy Hall  ZSM:[id number] DOB: 07-02-1971 DOA: 11/10/2020 PCP: Patient, No Pcp Per   Brief Narrative: 50 year old with past medical history significant for COPD, cocaine abuse, hypertension just discharged the day of admission after being admitted for a stroke with some left-sided weakness while sitting at home experience worsening dysarthria and also difficulty managing her secretions.  Patient was brought  as a code stroke   Assessment & Plan:   Principal Problem:   Acute CVA (cerebrovascular accident) [facility]) Active Problems:   Cocaine use disorder, moderate, dependence (HCC)   Essential hypertension   ARF (acute renal failure) (HCC)   Obesity, Class III, BMI 40-49.9 (morbid obesity) (HCC)  1-Acute CVA associated with dysphagia, dysarthria and mild left hemiparesis: -Neurology was consulted.  Repeated MRI showed progression of thalamic stroke and new areas of embolic a stroke.  Speech evaluation performed showing moderate to severe dysphagia. She does not want to comply with prescribed diet. Continue with aspirin and Plavix and statins.  History of cocaine use.  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 Full stroke work-up already completed.  No new recommendation Awaiting skilled nursing facility.  2-Essential hypertension: Continue with as needed hydralazine and continue with Norvasc.  3-Acute renal failure: Presented  with a creatinine of 1.3.  Baseline 0.8. Cr at Baseline.   4-cocaine abuse: Counseling provided 5-obesity: Needs weight loss Class III obesity       Estimated body mass index is 42.86 kg/m as calculated from the following:   Height as of this encounter: 5\' 1"  (1.549 m).   Weight as of 11/06/20: 102.9 kg.   DVT prophylaxis: Lovenox Code Status: Full code Family Communication: Discussed with patient Disposition Plan:  Status is: Inpatient  Remains inpatient  appropriate because:Unsafe d/c plan   Dispo: The patient is from: Home              Anticipated d/c is to: SNF              Anticipated d/c date is: 2 days              Patient currently is medically stable to d/c.  Awaiting skilled nursing facility        Consultants:   Neurology   Procedures:   ECHO; performed 1/02  Antimicrobials:  None  Subjective: She report nausea, unable to tolerates thickening.   Objective: Vitals:   11/14/20 0024 11/14/20 0440 11/14/20 0900 11/14/20 1203  BP: (!) 151/90 131/71 (!) 148/103 (!) 165/83  Pulse: 80 64 75 72  Resp: 18 20 20  (!) 21  Temp: 98 F (36.7 C) 97.8 F (36.6 C) 97.7 F (36.5 C) 98.3 F (36.8 C)  TempSrc: Oral Oral Oral Oral  SpO2: 100% 99%  97%  Height:        Intake/Output Summary (Last 24 hours) at 11/14/2020 1514 Last data filed at 11/13/2020 2100 Gross per 24 hour  Intake 240 ml  Output --  Net 240 ml   There were no vitals filed for this visit.  Examination:  General exam: Appears calm and comfortable  Respiratory system: Clear to auscultation. Respiratory effort normal. Cardiovascular system: S1 & S2 heard, RRR. No JVD, murmurs, rubs, gallops or clicks. No pedal edema. Gastrointestinal system: Abdomen is nondistended, soft and nontender. No organomegaly or masses felt. Normal bowel sounds heard. Central nervous system: Alert and oriented.  Extremities: no edema    Data Reviewed: I have personally reviewed following  labs and imaging studies  CBC: Recent Labs  Lab 11/10/20 1953 11/10/20 2017 11/11/20 0156 11/12/20 0433  WBC 13.8*  --  12.6* 9.5  NEUTROABS 10.6*  --   --   --   HGB 15.3* 16.7* 14.4 13.9  HCT 49.1* 49.0* 45.6 43.5  MCV 95.5  --  94.6 93.5  PLT 259  --  247 226   Basic Metabolic Panel: Recent Labs  Lab 11/09/20 0325 11/10/20 1953 11/10/20 2017 11/11/20 0156 11/12/20 0433  NA 138 138 140  --  139  K 4.1 4.5 4.5  --  3.8  CL 102 100 104  --  104  CO2 25 22  --   --  25   GLUCOSE 114* 103* 101*  --  102*  BUN 13 20 23*  --  15  CREATININE 0.87 1.30* 1.20* 1.03* 0.90  CALCIUM 8.6* 9.5  --   --  8.9   GFR: Estimated Creatinine Clearance: 84.2 mL/min (by C-G formula based on SCr of 0.9 mg/dL). Liver Function Tests: Recent Labs  Lab 11/10/20 1953  AST 39  ALT 22  ALKPHOS 74  BILITOT 0.8  PROT 8.1  ALBUMIN 4.2   No results for input(s): LIPASE, AMYLASE in the last 168 hours. No results for input(s): AMMONIA in the last 168 hours. Coagulation Profile: Recent Labs  Lab 11/10/20 1953  INR 1.0   Cardiac Enzymes: No results for input(s): CKTOTAL, CKMB, CKMBINDEX, TROPONINI in the last 168 hours. BNP (last 3 results) No results for input(s): PROBNP in the last 8760 hours. HbA1C: No results for input(s): HGBA1C in the last 72 hours. CBG: Recent Labs  Lab 11/13/20 1129 11/13/20 1533 11/13/20 2326 11/14/20 0640 11/14/20 1215  GLUCAP 224* 93 144* 105* 108*   Lipid Profile: No results for input(s): CHOL, HDL, LDLCALC, TRIG, CHOLHDL, LDLDIRECT in the last 72 hours. Thyroid Function Tests: No results for input(s): TSH, T4TOTAL, FREET4, T3FREE, THYROIDAB in the last 72 hours. Anemia Panel: No results for input(s): VITAMINB12, FOLATE, FERRITIN, TIBC, IRON, RETICCTPCT in the last 72 hours. Sepsis Labs: No results for input(s): PROCALCITON, LATICACIDVEN in the last 168 hours.  Recent Results (from the past 240 hour(s))  Resp Panel by RT-PCR (Flu A&B, Covid) Nasopharyngeal Swab     Status: None   Collection Time: 11/05/20 10:09 PM   Specimen: Nasopharyngeal Swab; Nasopharyngeal(NP) swabs in vial transport medium  Result Value Ref Range Status   SARS Coronavirus 2 by RT PCR NEGATIVE NEGATIVE Final    Comment: (NOTE) SARS-CoV-2 target nucleic acids are NOT DETECTED.  The SARS-CoV-2 RNA is generally detectable in upper respiratory specimens during the acute phase of infection. The lowest concentration of SARS-CoV-2 viral copies this assay can  detect is 138 copies/mL. A negative result does not preclude SARS-Cov-2 infection and should not be used as the sole basis for treatment or other patient management decisions. A negative result may occur with  improper specimen collection/handling, submission of specimen other than nasopharyngeal swab, presence of viral mutation(s) within the areas targeted by this assay, and inadequate number of viral copies(<138 copies/mL). A negative result must be combined with clinical observations, patient history, and epidemiological information. The expected result is Negative.  Fact Sheet for Patients:  BloggerCourse.com  Fact Sheet for Healthcare Providers:  SeriousBroker.it  This test is no t yet approved or cleared by the [place] FDA and  has been authorized for detection and/or diagnosis of SARS-CoV-2 by FDA under an Emergency Use Authorization (EUA). This EUA  will remain  in effect (meaning this test can be used) for the duration of the COVID-19 declaration under Section 564(b)(1) of the Act, 21 U.S.C.section 360bbb-3(b)(1), unless the authorization is terminated  or revoked sooner.       Influenza A by PCR NEGATIVE NEGATIVE Final   Influenza B by PCR NEGATIVE NEGATIVE Final    Comment: (NOTE) The Xpert Xpress SARS-CoV-2/FLU/RSV plus assay is intended as an aid in the diagnosis of influenza from Nasopharyngeal swab specimens and should not be used as a sole basis for treatment. Nasal washings and aspirates are unacceptable for Xpert Xpress SARS-CoV-2/FLU/RSV testing.  Fact Sheet for Patients: BloggerCourse.com  Fact Sheet for Healthcare Providers: SeriousBroker.it  This test is not yet approved or cleared by the [place] FDA and has been authorized for detection and/or diagnosis of SARS-CoV-2 by FDA under an Emergency Use Authorization (EUA). This EUA will remain in  effect (meaning this test can be used) for the duration of the COVID-19 declaration under Section 564(b)(1) of the Act, 21 U.S.C. section 360bbb-3(b)(1), unless the authorization is terminated or revoked.  Performed at [facility], [address], [place], [place] [address]   SARS CORONAVIRUS 2 (TAT 6-24 HRS) Nasopharyngeal Nasopharyngeal Swab     Status: None   Collection Time: 11/10/20 11:43 PM   Specimen: Nasopharyngeal Swab  Result Value Ref Range Status   SARS Coronavirus 2 NEGATIVE NEGATIVE Final    Comment: (NOTE) SARS-CoV-2 target nucleic acids are NOT DETECTED.  The SARS-CoV-2 RNA is generally detectable in upper and lower respiratory specimens during the acute phase of infection. Negative results do not preclude SARS-CoV-2 infection, do not rule out co-infections with other pathogens, and should not be used as the sole basis for treatment or other patient management decisions. Negative results must be combined with clinical observations, patient history, and epidemiological information. The expected result is Negative.  Fact Sheet for Patients: HairSlick.no  Fact Sheet for Healthcare Providers: quierodirigir.com  This test is not yet approved or cleared by the [place] FDA and  has been authorized for detection and/or diagnosis of SARS-CoV-2 by FDA under an Emergency Use Authorization (EUA). This EUA will remain  in effect (meaning this test can be used) for the duration of the COVID-19 declaration under Se ction 564(b)(1) of the Act, 21 U.S.C. section 360bbb-3(b)(1), unless the authorization is terminated or revoked sooner.  Performed at [facility], [address], [place], [place] [address]          Radiology Studies: No results found.      Scheduled Meds: .  stroke: mapping our early stages of recovery book   Does not apply Once  . amLODipine  5 mg Oral Daily  . aspirin  300  mg Rectal Daily   Or  . aspirin  325 mg Oral Daily  . atorvastatin  40 mg Oral Daily  . clopidogrel  75 mg Oral Daily  . [START ON 11/15/2020] COVID-19 mRNA vaccine (Moderna)  0.5 mL Intramuscular ONCE-1600  . enoxaparin (LOVENOX) injection  40 mg Subcutaneous Q24H  . FLUoxetine  10 mg Oral Daily  . gabapentin  100 mg Oral BID  . multivitamin with minerals  1 tablet Oral Daily  . OLANZapine  5 mg Oral QHS  . pantoprazole  40 mg Oral Daily   Continuous Infusions:   LOS: 3 days    Time spent: 35 minutes.     Alba Cory, MD [facility]   If 7PM-7AM,  please contact night-coverage [email or web address]  11/14/2020, 3:14 PM

## 2020-11-14 NOTE — Progress Notes (Signed)
  Speech Language Pathology Treatment: Dysphagia  Patient Details Name: Cathy Hall MRN: [id number] DOB: [birth year] Today's Date: 11/14/2020 Time: 6270-3500 SLP Time Calculation (min) (ACUTE ONLY): 13 min  Assessment / Plan / Recommendation Clinical Impression  Pt reports she can no longer tolerate thickened liquids or pureed foods. She feels her swallowing has become more automatic over the weekend, she does not have to apply effort to initiate a swallow. Under observation pt does not cough with rapid consecutive drinking but does have some mild anterior spillage. She is able to masticate, but there are still instances where pt does not immediately initiate a swallow prior to taking next bite, particularly when pt is talking. She can be impulsive and inattentive at times. Pt encouraged to pay attention to swallow and she was able to do so with a visual cue. Once pt did also have a cough while talking and swallowing. Pt may continue to be at increased risk of aspriation particularly with solids into the future. Provided instruction on the Heimlich maneuver and emphasized the importance of reducing distractions with meals. Will advance diet to mech soft thin while admitted and f/u for tolerance.   HPI HPI: Cathy Hall is a 50 y.o. female with history of COPD cocaine abuse hypertension just discharged today earlier after being admitted for stroke with some left-sided weakness while sitting at home experienced worsening dysarthria and also difficulty managing her secretions.   CT shows A known small subacute infarct within the posterior left midbrain from prior admission on 1/3, but there is concern for an new insult as well. Repeat MRI brain was attempted but due to worsening secretion and was unable to complete it.      SLP Plan  Continue with current plan of care       Recommendations  Diet recommendations: Dysphagia 3 (mechanical soft);Thin liquid Liquids provided  via: Cup;Straw Medication Administration: Whole meds with puree Supervision: Patient able to self feed Compensations: Slow rate;Small sips/bites;Minimize environmental distractions                Oral Care Recommendations: Oral care BID Follow up Recommendations: Skilled Nursing facility SLP Visit Diagnosis: Dysphagia, oropharyngeal phase (R13.12) Plan: Continue with current plan of care       GO               Harlon Ditty, MA CCC-SLP  Acute Rehabilitation Services Pager [phone] Office [phone]  Claudine Mouton 11/14/2020, 1:13 PM

## 2020-11-15 LAB — CBC
HCT: 44 % (ref 36.0–46.0)
Hemoglobin: 14 g/dL (ref 12.0–15.0)
MCH: 30 pg (ref 26.0–34.0)
MCHC: 31.8 g/dL (ref 30.0–36.0)
MCV: 94.2 fL (ref 80.0–100.0)
Platelets: 257 10*3/uL (ref 150–400)
RBC: 4.67 MIL/uL (ref 3.87–5.11)
RDW: 12.5 % (ref 11.5–15.5)
WBC: 9.1 10*3/uL (ref 4.0–10.5)
nRBC: 0 % (ref 0.0–0.2)

## 2020-11-15 LAB — SARS CORONAVIRUS 2 (TAT 6-24 HRS): SARS Coronavirus 2: NEGATIVE

## 2020-11-15 LAB — BASIC METABOLIC PANEL
Anion gap: 11 (ref 5–15)
BUN: 14 mg/dL (ref 6–20)
CO2: 26 mmol/L (ref 22–32)
Calcium: 9.5 mg/dL (ref 8.9–10.3)
Chloride: 101 mmol/L (ref 98–111)
Creatinine, Ser: 1.01 mg/dL — ABNORMAL HIGH (ref 0.44–1.00)
GFR, Estimated: >60 mL/min (ref >60)
Glucose, Bld: 122 mg/dL — ABNORMAL HIGH (ref 70–99)
Potassium: 4.3 mmol/L (ref 3.5–5.1)
Sodium: 138 mmol/L (ref 135–145)

## 2020-11-15 LAB — GLUCOSE, CAPILLARY
Glucose-Capillary: 115 mg/dL — ABNORMAL HIGH (ref 70–99)
Glucose-Capillary: 134 mg/dL — ABNORMAL HIGH (ref 70–99)
Glucose-Capillary: 139 mg/dL — ABNORMAL HIGH (ref 70–99)

## 2020-11-15 NOTE — Progress Notes (Signed)
[facility] PROGRESS NOTE    Cathy Hall  WLN:[id number] DOB: Aug 15, 1971 DOA: 11/10/2020 PCP: Patient, No Pcp Per      Brief Narrative:  Cathy Hall is a 50 y.o. F with obesity, cocaine use, COPD, and HTN recently admitted for new stroke, who returned with symptoms of dysarthria, difficulty managing secretions and worsening left sided weakness.  Was admitted from 1/1 to 1/5 for new R thalamic and L midbrain stroke with resulting left sided weakness.  On the day of discharge, patient was home for a few hours when she returned to the ER.  In the ER, CTA showed no LVO.  MRI brain showed multiple new bilateral subcortical white matter infarcts.        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  CTA head and neck was repeated on readmission with no new large vessel occlusion or high grade stenosis.  Echo not repeated given had just been completed.  -Continue aspirin and Plavix -Continue atorvastatin  -Continue amlodipine   Depression -Continue Prozac, Zyprexa  GERD Obesity BMI >30 -Continue pantoprazole  Neuropathy -Continue gabapentin        Disposition: Status is: Inpatient  Remains inpatient appropriate because:Unsafe d/c plan   Dispo: The patient is from: Home              Anticipated d/c is to: SNF              Anticipated d/c date is: 1 day              Patient currently is medically stable to d/c.              MDM: The below labs and imaging reports were reviewed and summarized above.  Medication management as above.    DVT prophylaxis: enoxaparin (LOVENOX) injection 40 mg Start: 11/11/20 1000  Code Status: FULL Family Communication:             Subjective: Patient tired.  Has some congestion, feels weak.  No  fever.  No cough.  No vomiting, aches.  No confusion.  Objective: Vitals:   11/14/20 2336 11/15/20 0332 11/15/20 0730 11/15/20 1325  BP: (!) 150/72 120/89 138/82 100/65  Pulse: 79 83 81 76  Resp: 18 17 18 18   Temp: 98.1 F (36.7 C) 98.2 F (36.8 C) 97.9 F (36.6 C) 98.2 F (36.8 C)  TempSrc: Oral  Oral Oral  SpO2: 96% 94% 100% 95%  Height:       No intake or output data in the 24 hours ending 11/15/20 1633 There were no vitals filed for this visit.  Examination: General appearance: obese adult female, awake, lying in bed, no acute distress.   HEENT: Anicteric, conjunctiva pink, lids and lashes normal. No nasal deformity, discharge, epistaxis.  Lips moist.   Skin: Warm and dry.   No suspicious rashes or lesions. Cardiac: RRR, nl S1-S2, no murmurs appreciated.  Capillary refill is brisk.  JVP not visible.  No LE edema.  Radial pulses 2+ and symmetric. Respiratory: Normal respiratory rate and rhythm.  CTAB without rales or wheezes. Abdomen: Abdomen soft.  No TTP or grimace. No ascites, distension, hepatosplenomegaly.   MSK: No deformities or effusions. Neuro: Awake and alert.  Left side weakness.  Speech fluent, normal.    Psych: Sensorium intact and responding to questions, attention normal.  Affect normal.  Judgment and insight appear normal.    Data Reviewed: I have personally reviewed following labs and imaging studies:  CBC: Recent Labs  Lab 11/10/20 1953 11/10/20 2017 11/11/20 0156 11/12/20 0433 11/15/20 0451  WBC 13.8*  --  12.6* 9.5 9.1  NEUTROABS 10.6*  --   --   --   --   HGB 15.3* 16.7* 14.4 13.9 14.0  HCT 49.1* 49.0* 45.6 43.5 44.0  MCV 95.5  --  94.6 93.5 94.2  PLT 259  --  247 226 257   Basic Metabolic Panel: Recent Labs  Lab 11/09/20 0325 11/10/20 1953 11/10/20 2017 11/11/20 0156 11/12/20 0433 11/15/20 0451  NA 138 138 140  --  139 138  K 4.1 4.5 4.5  --  3.8 4.3  CL 102 100 104  --  104 101  CO2 25 22  --   --  25 26  GLUCOSE 114* 103* 101*   --  102* 122*  BUN 13 20 23*  --  15 14  CREATININE 0.87 1.30* 1.20* 1.03* 0.90 1.01*  CALCIUM 8.6* 9.5  --   --  8.9 9.5   GFR: Estimated Creatinine Clearance: 75.1 mL/min (A) (by C-G formula based on SCr of 1.01 mg/dL (H)). Liver Function Tests: Recent Labs  Lab 11/10/20 1953  AST 39  ALT 22  ALKPHOS 74  BILITOT 0.8  PROT 8.1  ALBUMIN 4.2   No results for input(s): LIPASE, AMYLASE in the last 168 hours. No results for input(s): AMMONIA in the last 168 hours. Coagulation Profile: Recent Labs  Lab 11/10/20 1953  INR 1.0   Cardiac Enzymes: No results for input(s): CKTOTAL, CKMB, CKMBINDEX, TROPONINI in the last 168 hours. BNP (last 3 results) No results for input(s): PROBNP in the last 8760 hours. HbA1C: No results for input(s): HGBA1C in the last 72 hours. CBG: Recent Labs  Lab 11/13/20 2326 11/14/20 0640 11/14/20 1215 11/14/20 2337 11/15/20 0746  GLUCAP 144* 105* 108* 133* 115*   Lipid Profile: No results for input(s): CHOL, HDL, LDLCALC, TRIG, CHOLHDL, LDLDIRECT in the last 72 hours. Thyroid Function Tests: No results for input(s): TSH, T4TOTAL, FREET4, T3FREE, THYROIDAB in the last 72 hours. Anemia Panel: No results for input(s): VITAMINB12, FOLATE, FERRITIN, TIBC, IRON, RETICCTPCT in the last 72 hours. Urine analysis:    Component Value Date/Time   COLORURINE YELLOW 11/06/2020 1334   APPEARANCEUR CLEAR 11/06/2020 1334   LABSPEC 1.013 11/06/2020 1334   PHURINE 6.0 11/06/2020 1334   GLUCOSEU NEGATIVE 11/06/2020 1334   HGBUR NEGATIVE 11/06/2020 1334   BILIRUBINUR NEGATIVE 11/06/2020 1334   KETONESUR NEGATIVE 11/06/2020 1334   PROTEINUR NEGATIVE 11/06/2020 1334   UROBILINOGEN 0.2 08/07/2020 1139   NITRITE NEGATIVE 11/06/2020 1334   LEUKOCYTESUR TRACE (A) 11/06/2020 1334   Sepsis Labs: @LABRCNTIP (procalcitonin:4,lacticacidven:4)  ) Recent Results (from the past 240 hour(s))  Resp Panel by RT-PCR (Flu A&B, Covid) Nasopharyngeal Swab     Status: None    Collection Time: 11/05/20 10:09 PM   Specimen: Nasopharyngeal Swab; Nasopharyngeal(NP) swabs in vial transport medium  Result Value Ref Range Status   SARS Coronavirus 2 by RT PCR NEGATIVE NEGATIVE Final    Comment: (NOTE) SARS-CoV-2 target nucleic acids are NOT DETECTED.  The SARS-CoV-2 RNA is generally detectable in upper respiratory specimens during the acute phase of infection. The lowest concentration of SARS-CoV-2 viral copies this assay can detect is 138 copies/mL. A negative result does not preclude SARS-Cov-2 infection and should not be used as the  sole basis for treatment or other patient management decisions. A negative result may occur with  improper specimen collection/handling, submission of specimen other than nasopharyngeal swab, presence of viral mutation(s) within the areas targeted by this assay, and inadequate number of viral copies(<138 copies/mL). A negative result must be combined with clinical observations, patient history, and epidemiological information. The expected result is Negative.  Fact Sheet for Patients:  BloggerCourse.com  Fact Sheet for Healthcare Providers:  SeriousBroker.it  This test is no t yet approved or cleared by the [place] FDA and  has been authorized for detection and/or diagnosis of SARS-CoV-2 by FDA under an Emergency Use Authorization (EUA). This EUA will remain  in effect (meaning this test can be used) for the duration of the COVID-19 declaration under Section 564(b)(1) of the Act, 21 U.S.C.section 360bbb-3(b)(1), unless the authorization is terminated  or revoked sooner.       Influenza A by PCR NEGATIVE NEGATIVE Final   Influenza B by PCR NEGATIVE NEGATIVE Final    Comment: (NOTE) The Xpert Xpress SARS-CoV-2/FLU/RSV plus assay is intended as an aid in the diagnosis of influenza from Nasopharyngeal swab specimens and should not be used as a sole basis for treatment.  Nasal washings and aspirates are unacceptable for Xpert Xpress SARS-CoV-2/FLU/RSV testing.  Fact Sheet for Patients: BloggerCourse.com  Fact Sheet for Healthcare Providers: SeriousBroker.it  This test is not yet approved or cleared by the [place] FDA and has been authorized for detection and/or diagnosis of SARS-CoV-2 by FDA under an Emergency Use Authorization (EUA). This EUA will remain in effect (meaning this test can be used) for the duration of the COVID-19 declaration under Section 564(b)(1) of the Act, 21 U.S.C. section 360bbb-3(b)(1), unless the authorization is terminated or revoked.  Performed at [facility], [address], [place], [place] [address]   SARS CORONAVIRUS 2 (TAT 6-24 HRS) Nasopharyngeal Nasopharyngeal Swab     Status: None   Collection Time: 11/10/20 11:43 PM   Specimen: Nasopharyngeal Swab  Result Value Ref Range Status   SARS Coronavirus 2 NEGATIVE NEGATIVE Final    Comment: (NOTE) SARS-CoV-2 target nucleic acids are NOT DETECTED.  The SARS-CoV-2 RNA is generally detectable in upper and lower respiratory specimens during the acute phase of infection. Negative results do not preclude SARS-CoV-2 infection, do not rule out co-infections with other pathogens, and should not be used as the sole basis for treatment or other patient management decisions. Negative results must be combined with clinical observations, patient history, and epidemiological information. The expected result is Negative.  Fact Sheet for Patients: HairSlick.no  Fact Sheet for Healthcare Providers: quierodirigir.com  This test is not yet approved or cleared by the [place] FDA and  has been authorized for detection and/or diagnosis of SARS-CoV-2 by FDA under an Emergency Use Authorization (EUA). This EUA will remain  in effect (meaning this test can be used)  for the duration of the COVID-19 declaration under Se ction 564(b)(1) of the Act, 21 U.S.C. section 360bbb-3(b)(1), unless the authorization is terminated or revoked sooner.  Performed at [facility], [address], [place], [place] [address]   SARS CORONAVIRUS 2 (TAT 6-24 HRS) Nasopharyngeal Nasopharyngeal Swab     Status: None   Collection Time: 11/15/20 10:38 AM   Specimen: Nasopharyngeal Swab  Result Value Ref Range Status   SARS Coronavirus 2 NEGATIVE NEGATIVE Final    Comment: (NOTE) SARS-CoV-2 target nucleic acids are NOT DETECTED.  The SARS-CoV-2 RNA is generally detectable  in upper and lower respiratory specimens during the acute phase of infection. Negative results do not preclude SARS-CoV-2 infection, do not rule out co-infections with other pathogens, and should not be used as the sole basis for treatment or other patient management decisions. Negative results must be combined with clinical observations, patient history, and epidemiological information. The expected result is Negative.  Fact Sheet for Patients: HairSlick.no  Fact Sheet for Healthcare Providers: quierodirigir.com  This test is not yet approved or cleared by the [place] FDA and  has been authorized for detection and/or diagnosis of SARS-CoV-2 by FDA under an Emergency Use Authorization (EUA). This EUA will remain  in effect (meaning this test can be used) for the duration of the COVID-19 declaration under Se ction 564(b)(1) of the Act, 21 U.S.C. section 360bbb-3(b)(1), unless the authorization is terminated or revoked sooner.  Performed at [facility], [address], [place], [place] [address]          Radiology Studies: No results found.      Scheduled Meds: .  stroke: mapping our early stages of recovery book   Does not apply Once  . amLODipine  5 mg Oral Daily  . aspirin  300 mg Rectal Daily   Or  .  aspirin  325 mg Oral Daily  . atorvastatin  40 mg Oral Daily  . clopidogrel  75 mg Oral Daily  . COVID-19 mRNA vaccine (Moderna)  0.5 mL Intramuscular ONCE-1600  . enoxaparin (LOVENOX) injection  40 mg Subcutaneous Q24H  . FLUoxetine  10 mg Oral Daily  . gabapentin  100 mg Oral BID  . multivitamin with minerals  1 tablet Oral Daily  . OLANZapine  5 mg Oral QHS  . pantoprazole  40 mg Oral Daily   Continuous Infusions:   LOS: 4 days    Time spent: 25 minutes    Alberteen Sam, MD [facility] 11/15/2020, 4:33 PM     Please page though AMION or Epic secure chat:  For [employer], Higher education careers adviser

## 2020-11-15 NOTE — Progress Notes (Signed)
Physical Therapy Treatment Patient Details Name: Cathy Hall MRN: [id number] DOB: 1971/10/15 Today's Date: 11/15/2020    History of Present Illness Pt is a 50 y/o female admitted secondary to worsening L sided weakness, difficulty managing secretions and dysarthria. Thought to be secondary to extension of recent CVA. Recently discharged secondary to CVA. PMH inclues CVA, COPD, drug abuse, PTSD.    PT Comments    Pt reports she feels that L side strength has improved but still with UE coordination deficits. She also relays frontal HA and "bump" on her posterior head that is sore. Pt ambulated with and without RW, began steady but fatigues with increased distance and begins to stagger. Decreased safety with distances >25'. PT will continue to follow.    Follow Up Recommendations  SNF;Supervision/Assistance - 24 hour     Equipment Recommendations       Recommendations for Other Services       Precautions / Restrictions Precautions Precautions: Fall Precaution Comments: mildly impulsive Restrictions Weight Bearing Restrictions: No    Mobility  Bed Mobility Overal bed mobility: Needs Assistance Bed Mobility: Supine to Sit     Supine to sit: Supervision Sit to supine: Modified independent (Device/Increase time)   General bed mobility comments: close supervision as pt sits up right at EOB and has some difficulty scooting back. Returns to bed without assist  Transfers Overall transfer level: Needs assistance Equipment used: Rolling walker (2 wheeled);None Transfers: Sit to/from Stand Sit to Stand: Min guard         General transfer comment: increased sway with 5th stand, fatigued  Ambulation/Gait Ambulation/Gait assistance: Min guard Gait Distance (Feet): 75 Feet (15' x5) Assistive device: Rolling walker (2 wheeled);None Gait Pattern/deviations: Step-through pattern;Decreased stride length;Shuffle;Staggering left Gait velocity: decreased Gait velocity  interpretation: <1.31 ft/sec, indicative of household ambulator General Gait Details: first bout of ambulation with RW without LOB. Pt prefers to not use RW so subsequent bouts without but as she fatigues she occasionally staggers L. Self corrected but min-guard given for safety. Pt fatigued after bouts 4 and 5   Stairs             Wheelchair Mobility    Modified Rankin (Stroke Patients Only) Modified Rankin (Stroke Patients Only) Pre-Morbid Rankin Score: Slight disability Modified Rankin: Moderately severe disability     Balance Overall balance assessment: Needs assistance Sitting-balance support: Feet supported;No upper extremity supported Sitting balance-Leahy Scale: Fair     Standing balance support: Bilateral upper extremity supported Standing balance-Leahy Scale: Fair Standing balance comment: able to maintain static stance safely without support                            Cognition Arousal/Alertness: Awake/alert Behavior During Therapy: WFL for tasks assessed/performed Overall Cognitive Status: Impaired/Different from baseline Area of Impairment: Problem solving;Attention;Safety/judgement;Awareness                   Current Attention Level: Sustained     Safety/Judgement: Decreased awareness of deficits;Decreased awareness of safety Awareness: Anticipatory Problem Solving: Slow processing;Requires verbal cues;Requires tactile cues General Comments: decreased insight into deficits and general health. Pt in bed with snickers wrapper and one uneaten bar. She reports she has had 2 snickers today. She also reports she knows she needs to eat healthy to take care of herself      Exercises      General Comments General comments (skin integrity, edema, etc.): denies dizziness or visual changes but  noted L eyelid remains partially closed. Asymmetrical smile.      Pertinent Vitals/Pain Pain Assessment: Faces Faces Pain Scale: Hurts little  more Pain Location: frontal HA and "bump" posterior head Pain Descriptors / Indicators: Headache Pain Intervention(s): Monitored during session    Home Living                      Prior Function            PT Goals (current goals can now be found in the care plan section) Acute Rehab PT Goals Patient Stated Goal: to be more independent PT Goal Formulation: With patient Time For Goal Achievement: 11/25/20 Potential to Achieve Goals: Fair Progress towards PT goals: Progressing toward goals    Frequency    Min 3X/week      PT Plan Current plan remains appropriate    Hall-evaluation              AM-PAC PT "6 Clicks" Mobility   Outcome Measure  Help needed turning from your back to your side while in a flat bed without using bedrails?: A Little Help needed moving from lying on your back to sitting on the side of a flat bed without using bedrails?: A Lot Help needed moving to and from a bed to a chair (including a wheelchair)?: A Lot Help needed standing up from a chair using your arms (e.g., wheelchair or bedside chair)?: A Lot Help needed to walk in hospital room?: A Lot Help needed climbing 3-5 steps with a railing? : Total 6 Click Score: 12    End of Session   Activity Tolerance: Patient tolerated treatment well Patient left: in bed;with call bell/phone within reach;with family/visitor present (visitor asleep in chair) Nurse Communication: Mobility status PT Visit Diagnosis: Unsteadiness on feet (R26.81);Muscle weakness (generalized) (M62.81)     Time: 6503-5465 PT Time Calculation (min) (ACUTE ONLY): 27 min  Charges:  $Gait Training: 23-37 mins                     Cathy Hall, PT  Acute Rehab Services  Pager [phone] Office [phone]    Cathy Hall 11/15/2020, 4:56 PM

## 2020-11-16 ENCOUNTER — Inpatient Hospital Stay (HOSPITAL_COMMUNITY): Payer: Self-pay

## 2020-11-16 LAB — GLUCOSE, CAPILLARY
Glucose-Capillary: 125 mg/dL — ABNORMAL HIGH (ref 70–99)
Glucose-Capillary: 117 mg/dL — ABNORMAL HIGH (ref 70–99)

## 2020-11-16 IMAGING — DX DG CHEST 2V
2 series · 2 of 2 positions shown · non-contrast
Comparison: [DATE]

CLINICAL DATA: Congestion.  Recent stroke.  COPD.  Asthma.

EXAM:
CHEST - 2 VIEW

[chest lat]
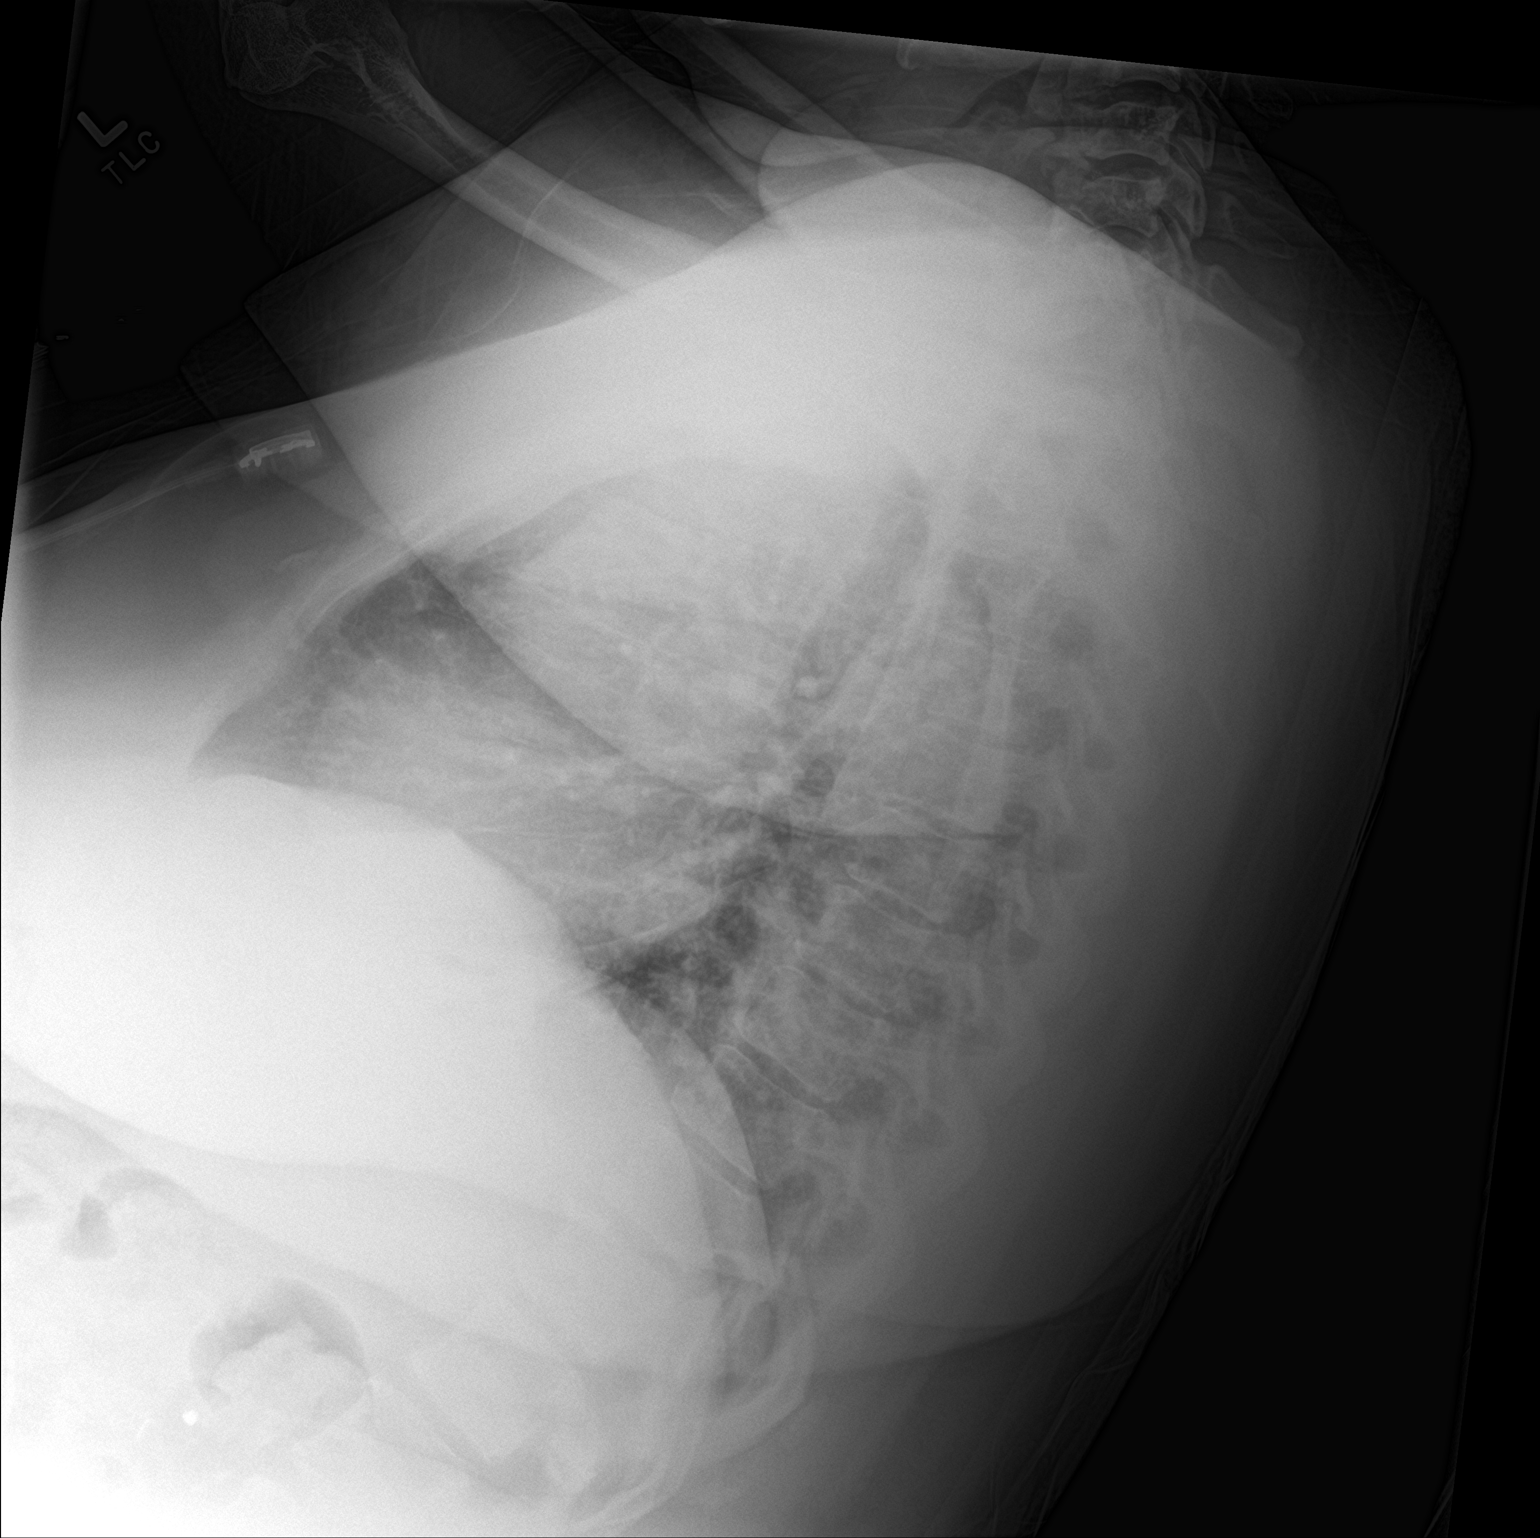

[chest ap]
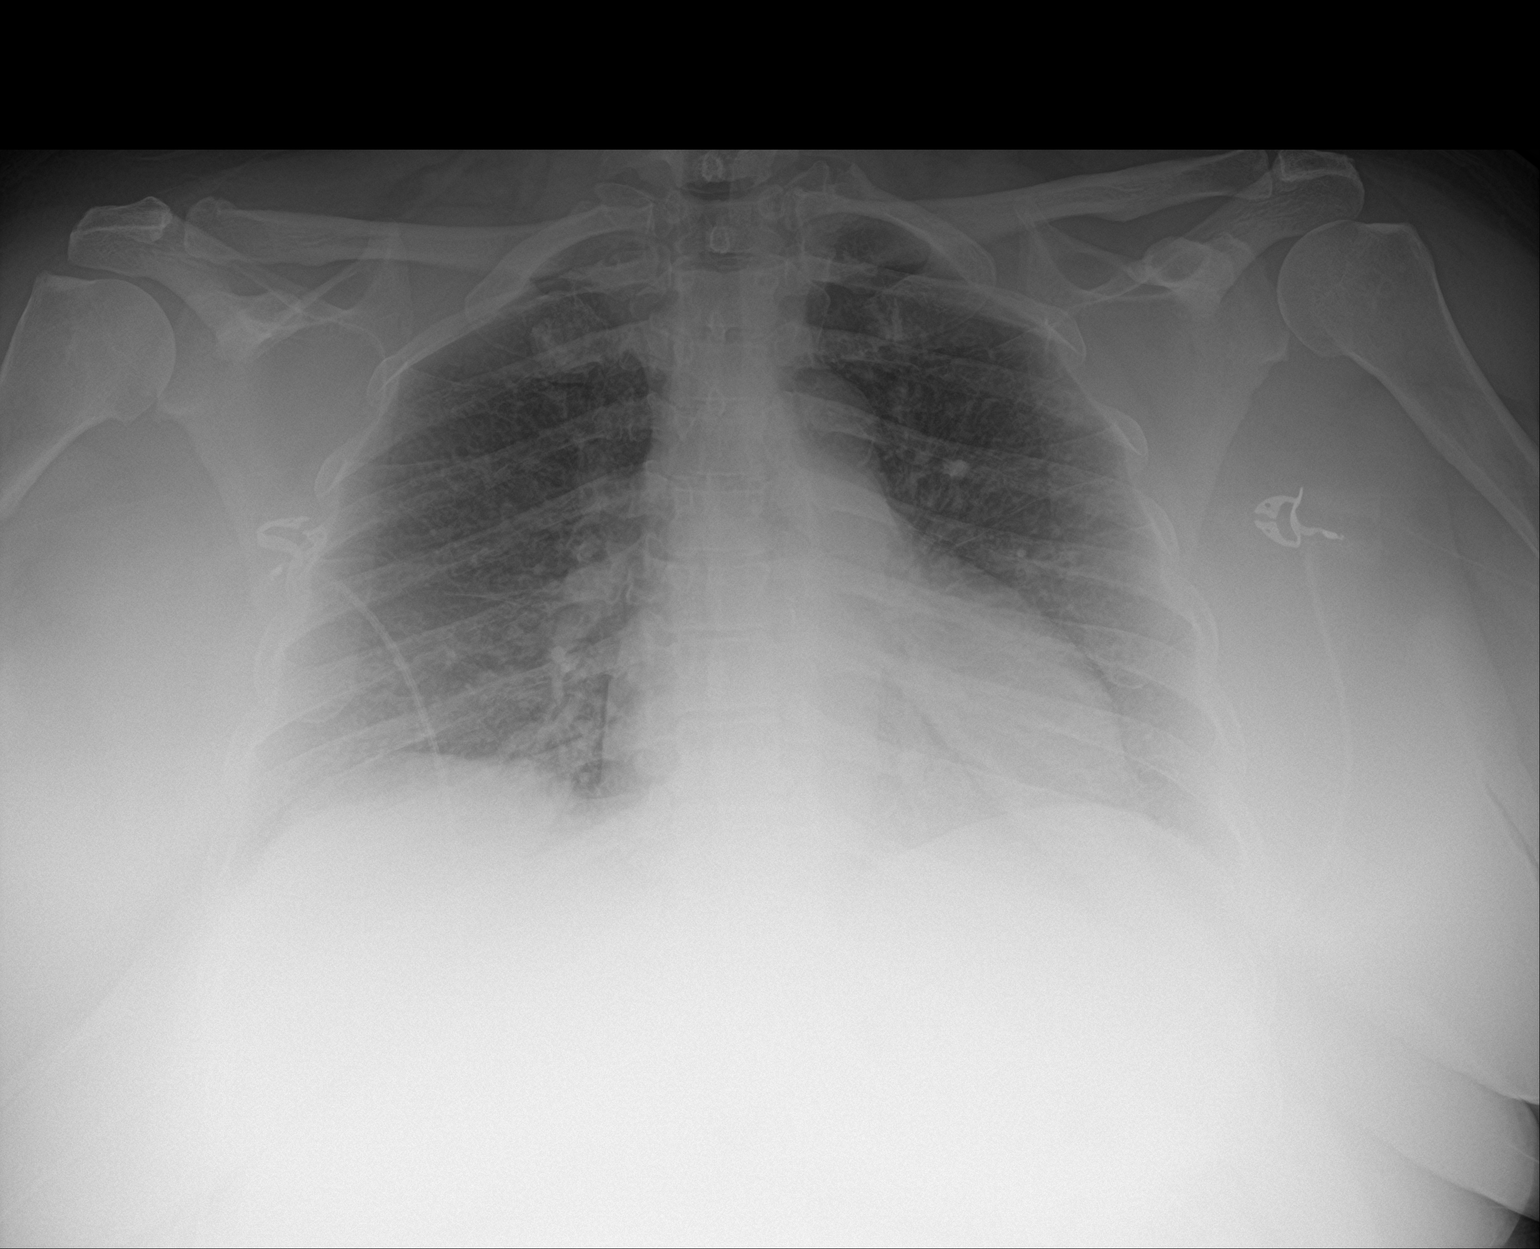

[2 of 2 positions shown; findings below may reference images not displayed]

FINDINGS: Lateral view degraded by patient arm position. Midline trachea.
Normal heart size for level of inspiration. No pleural effusion or
pneumothorax. Clear lungs.
IMPRESSION: No active cardiopulmonary disease.

## 2020-11-16 MED ORDER — ALBUTEROL SULFATE HFA 108 (90 BASE) MCG/ACT IN AERS
2.0000 | INHALATION_SPRAY | RESPIRATORY_TRACT | Status: DC | PRN
  Administered 2020-11-16 – 2020-11-28 (×8): 2 via RESPIRATORY_TRACT
  Filled 2020-11-16: qty 6.7

## 2020-11-16 NOTE — Progress Notes (Signed)
  Speech Language Pathology Treatment: Dysphagia  Patient Details Name: Cathy Hall MRN: [id number] DOB: 1971-06-17 Today's Date: 11/16/2020 Time: 6712-4580 SLP Time Calculation (min) (ACUTE ONLY): 8 min  Assessment / Plan / Recommendation Clinical Impression  Pt seen at bedside with goal of intervention to reinforce aspiration precautions in the setting of ongoing dysphagia. Pt found laying on her side int he bed eating potatoes from her lunch tray. She was also over-stuffing the left buccal cavity. SLP insisted pt sit upright and clear oral cavity. Reiterated pts risk of aspiration given delayed swallow initiation and even aspiration of masticated solids on MBS. Also observed pt to continue coughing with sips of Sprite. Cued pt to take a smaller sips orally hold bolus and swallow. Pt trialed this three times and asked to stop because she wanted to lie down. Tried to help pt again understand that she could easily choke on her food and liquids, that I upgraded her diet because she refused to continue the safer modified textures and that she needs to take extra precautions when eating and drinking. She agreed verbally, but anticipate that pt is unlikely to follow safety recommendations. Will f/u to reinforce again as able.    HPI HPI: Cathy Hall is a 50 y.o. female with history of COPD cocaine abuse hypertension just discharged today earlier after being admitted for stroke with some left-sided weakness while sitting at home experienced worsening dysarthria and also difficulty managing her secretions.   CT shows A known small subacute infarct within the posterior left midbrain from prior admission on 1/3, but there is concern for an new insult as well. Repeat MRI brain was attempted but due to worsening secretion and was unable to complete it.      SLP Plan  Continue with current plan of care       Recommendations  Diet recommendations: Dysphagia 3 (mechanical  soft);Thin liquid Liquids provided via: Cup Medication Administration: Whole meds with puree Supervision: Patient able to self feed Compensations: Slow rate;Small sips/bites;Minimize environmental distractions                Oral Care Recommendations: Oral care BID Follow up Recommendations: Skilled Nursing facility SLP Visit Diagnosis: Dysphagia, oropharyngeal phase (R13.12) Plan: Continue with current plan of care       GO               Harlon Ditty, MA CCC-SLP  Acute Rehabilitation Services Pager [phone] Office [phone]  Claudine Mouton 11/16/2020, 1:26 PM

## 2020-11-16 NOTE — TOC Progression Note (Signed)
Transition of Care [facility]) - Progression Note    Patient Details  Name: Cathy Hall MRN: [id number] Date of Birth: April 09, 1971  Transition of Care [facility]) CM/SW [place], RN Phone Number: 11/16/2020, 11:53 AM  Clinical Narrative:    Case management met with the patient at the bedside regarding transitions of care.  The patient was admitted for extended CVA and left sided weakness.  The patient has been staying with her daughter, Melissa Montane, in [place], [place] at [address] [place], [place].  The patient continues to be fatigued and weak with ambulation.  She also received her first COVID vaccine on 11/15/2020.  A CAGE assessment was done at the bedside and patient states that she has been attending outpatient visits at [facility] on [place] for substance abuse history with cocaine use and depression relating to the patient's loss of her twin sister and son in the last year.  The patient states that her son was murdered and her sister died within the last year.  Case management will continue to follow for PASSR approval and SNF placement opportunities for the patient.   Expected Discharge Plan: [place] Barriers to Discharge:  (No insurance - will need LOG for SNF placement)  Expected Discharge Plan and Services Expected Discharge Plan: [place] In-house Referral: Clinical Social Work,Financial Counselor,PCP / Health Connect Discharge Planning Services: CM Consult Post Acute Care Choice: [place] Living arrangements for the past 2 months: [place] (Patient states that she has been living with family at [address] [place], [place] [address])                                       Social Determinants of Health ([place]) Interventions    Readmission Risk Interventions Readmission Risk Prevention Plan 11/16/2020  Transportation Screening Complete  PCP or Specialist  Appt within 3-5 Days Complete  HRI or [place] Complete  Social Work Consult for [place] Planning/Counseling Complete  Palliative Care Screening Complete  Medication Review Press photographer) Complete

## 2020-11-16 NOTE — TOC Progression Note (Signed)
Transition of Care [facility]) - Progression Note    Patient Details  Name: Cathy Hall MRN: [id number] Date of Birth: 26-Jan-1971  Transition of Care [facility]) CM/SW Contact  Baldemar Lenis, [place] Phone Number: 11/16/2020, 11:18 AM  Clinical Narrative:   CSW received call from PASRR to schedule Level 2 interview, but COVID test is pending. Requested to schedule interview tomorrow when COVID test has resulted. CSW to follow.    Expected Discharge Plan: Skilled Nursing Facility Barriers to Discharge: Inadequate or no insurance,Other (comment) (Only one covid shot)  Expected Discharge Plan and Services Expected Discharge Plan: Skilled Nursing Facility     Post Acute Care Choice: Skilled Nursing Facility Living arrangements for the past 2 months: Single Family Home                                       Social Determinants of Health (SDOH) Interventions    Readmission Risk Interventions No flowsheet data found.

## 2020-11-16 NOTE — Progress Notes (Signed)
[facility] PROGRESS NOTE  Cathy Hall ZOX:[id number] DOB: 03-30-1971 DOA: 11/10/2020 PCP: Patient, No Pcp Per  Status: Remains inpatient appropriate because:Unsafe d/c plan   Dispo: The patient is from: Home              Anticipated d/c is to: SNF              Anticipated d/c date is: > 3 days              Patient currently is medically stable to d/c. Barriers to discharge. SNF recommended for short-term rehab but patient does not have a funding source. Medicaid application pending  Code Status: Full Family Communication: Patient DVT prophylaxis: Lovenox Vaccination status: Received first dose of Moderna vaccine on 1/11  Foley catheter: No  HPI:  50 y.o. F with obesity, cocaine use, COPD, and HTN recently admitted for new stroke, who returned with symptoms of dysarthria, difficulty managing secretions and worsening left sided weakness.  Was admitted from 1/1 to 1/5 for new R thalamic and L midbrain stroke with resulting left sided weakness.  On the day of discharge, patient was home for a few hours when she returned to the ER.  In the ER, CTA showed no LVO.  MRI brain showed multiple new bilateral subcortical white matter infarcts.     Subjective: Alert. Reporting increased congestion and overall malaise.  Objective: Vitals:   11/16/20 0723 11/16/20 1135  BP: (!) 137/92 132/79  Pulse: 78 77  Resp: 20 18  Temp: 98.1 F (36.7 C) 98.4 F (36.9 C)  SpO2: 92% 100%   No intake or output data in the 24 hours ending 11/16/20 1249 There were no vitals filed for this visit.  Exam:  Constitutional: NAD, calm, comfortable Respiratory: Coarse to auscultation without wheezing or crackles. Normal respiratory effort. No accessory muscle use. Hypoxia although patient reports was placed on 2 L nasal cannula overnight (minutes so suspect this was done for patient comfort only) Cardiovascular: Regular rate and rhythm, no murmurs / rubs / gallops. No extremity  edema. 2+ pedal pulses.  Abdomen: no tenderness, no masses palpated. Bowel sounds positive. LBM 1/06 Musculoskeletal: no clubbing / cyanosis. No joint deformity upper and lower extremities. Good ROM, Normal muscle tone.  Skin: no rashes, lesions, ulcers. No induration Neurologic: CN 2-12 grossly intact. Sensation intact, DTR normal. Strength 5/5 on the right with UEs strength 4/5 in LLE strength 3/5 Psychiatric: Normal judgment and insight. Alert and oriented x 3. Normal mood.    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   -CTA head and neck was repeated on readmission with no new large vessel occlusion or high grade stenosis.   -Echo not repeated given had just been completed. -Continue aspirin and Plavix -Continue atorvastatin  Asthma/acute chest congestion -Not hypoxic -Negative for COVID on 1/11 therefore isolation discontinued -We'll increase frequency of albuterol HFA to every 4 hours as needed -1/12 chest x-ray unremarkable  Hypertension -Continue amlodipine -Echocardiogram last admission with preserved LV function and no evidence of diastolic dysfunction  Depression -Continue Prozac, Zyprexa -Last year when patient's son was murdered and her twin sister died less than 12 months ago  Neuropathy -Continue gabapentin  Other problems: GERD Obesity BMI >30 -Continue pantoprazole   Data Reviewed: Basic Metabolic Panel: Recent Labs  Lab 11/10/20 1953 11/10/20 2017 11/11/20 0156 11/12/20 0433 11/15/20 0451  NA 138 140  --  139 138  K 4.5 4.5  --  3.8 4.3  CL 100 104  --  104 101  CO2 22  --   --  25 26  GLUCOSE 103* 101*  --  102* 122*  BUN 20 23*  --  15 14  CREATININE 1.30* 1.20* 1.03* 0.90 1.01*  CALCIUM 9.5  --   --  8.9 9.5   Liver Function Tests: Recent Labs   Lab 11/10/20 1953  AST 39  ALT 22  ALKPHOS 74  BILITOT 0.8  PROT 8.1  ALBUMIN 4.2   No results for input(s): LIPASE, AMYLASE in the last 168 hours. No results for input(s): AMMONIA in the last 168 hours. CBC: Recent Labs  Lab 11/10/20 1953 11/10/20 2017 11/11/20 0156 11/12/20 0433 11/15/20 0451  WBC 13.8*  --  12.6* 9.5 9.1  NEUTROABS 10.6*  --   --   --   --   HGB 15.3* 16.7* 14.4 13.9 14.0  HCT 49.1* 49.0* 45.6 43.5 44.0  MCV 95.5  --  94.6 93.5 94.2  PLT 259  --  247 226 257   Cardiac Enzymes: No results for input(s): CKTOTAL, CKMB, CKMBINDEX, TROPONINI in the last 168 hours. BNP (last 3 results) No results for input(s): BNP in the last 8760 hours.  ProBNP (last 3 results) No results for input(s): PROBNP in the last 8760 hours.  CBG: Recent Labs  Lab 11/15/20 0746 11/15/20 1737 11/15/20 2339 11/16/20 0617 11/16/20 1204  GLUCAP 115* 139* 134* 125* 117*    Recent Results (from the past 240 hour(s))  SARS CORONAVIRUS 2 (TAT 6-24 HRS) Nasopharyngeal Nasopharyngeal Swab     Status: None   Collection Time: 11/10/20 11:43 PM   Specimen: Nasopharyngeal Swab  Result Value Ref Range Status   SARS Coronavirus 2 NEGATIVE NEGATIVE Final    Comment: (NOTE) SARS-CoV-2 target nucleic acids are NOT DETECTED.  The SARS-CoV-2 RNA is generally detectable in upper and lower respiratory specimens during the acute phase of infection. Negative results do not preclude SARS-CoV-2 infection, do not rule out co-infections with other pathogens, and should not be used as the sole basis for treatment or other patient management decisions. Negative results must be combined with clinical observations, patient history, and epidemiological information. The expected result is Negative.  Fact Sheet for Patients: HairSlick.no  Fact Sheet for Healthcare Providers: quierodirigir.com  This test is not yet approved or cleared by the  [place] FDA and  has been authorized for detection and/or diagnosis of SARS-CoV-2 by FDA under an Emergency Use Authorization (EUA). This EUA will remain  in effect (meaning this test can be used) for the duration of the COVID-19 declaration under Se ction 564(b)(1) of the Act, 21 U.S.C. section 360bbb-3(b)(1), unless the authorization is terminated or revoked sooner.  Performed at [facility], [address], [place], [place] [address]   SARS CORONAVIRUS 2 (TAT 6-24 HRS) Nasopharyngeal Nasopharyngeal Swab     Status: None   Collection Time: 11/15/20 10:38 AM   Specimen: Nasopharyngeal Swab  Result Value Ref Range Status   SARS Coronavirus 2 NEGATIVE NEGATIVE Final    Comment: (NOTE) SARS-CoV-2 target nucleic acids are NOT DETECTED.  The SARS-CoV-2 RNA is generally detectable in upper and lower respiratory specimens during the acute phase of infection. Negative results do not preclude SARS-CoV-2 infection, do not rule out co-infections with other pathogens, and should not be used as the sole basis for treatment or other  patient management decisions. Negative results must be combined with clinical observations, patient history, and epidemiological information. The expected result is Negative.  Fact Sheet for Patients: HairSlick.no  Fact Sheet for Healthcare Providers: quierodirigir.com  This test is not yet approved or cleared by the [place] FDA and  has been authorized for detection and/or diagnosis of SARS-CoV-2 by FDA under an Emergency Use Authorization (EUA). This EUA will remain  in effect (meaning this test can be used) for the duration of the COVID-19 declaration under Se ction 564(b)(1) of the Act, 21 U.S.C. section 360bbb-3(b)(1), unless the authorization is terminated or revoked sooner.  Performed at [facility], [address] [place], [place] [address]      Studies: DG Chest 2  View  Result Date: 11/16/2020 CLINICAL DATA:  Congestion.  Recent stroke.  COPD.  Asthma. EXAM: CHEST - 2 VIEW COMPARISON:  11/11/2018 FINDINGS: Lateral view degraded by patient arm position. Midline trachea. Normal heart size for level of inspiration. No pleural effusion or pneumothorax. Clear lungs. IMPRESSION: No active cardiopulmonary disease. Electronically Signed   By: Jeronimo Greaves M.D.   On: 11/16/2020 10:40    Scheduled Meds: .  stroke: mapping our early stages of recovery book   Does not apply Once  . amLODipine  5 mg Oral Daily  . aspirin  300 mg Rectal Daily   Or  . aspirin  325 mg Oral Daily  . atorvastatin  40 mg Oral Daily  . clopidogrel  75 mg Oral Daily  . enoxaparin (LOVENOX) injection  40 mg Subcutaneous Q24H  . FLUoxetine  10 mg Oral Daily  . gabapentin  100 mg Oral BID  . multivitamin with minerals  1 tablet Oral Daily  . OLANZapine  5 mg Oral QHS  . pantoprazole  40 mg Oral Daily   Continuous Infusions:  Principal Problem:   Acute CVA (cerebrovascular accident) (HCC) Active Problems:   Cocaine use disorder, moderate, dependence (HCC)   Essential hypertension   ARF (acute renal failure) (HCC)   Obesity, Class III, BMI 40-49.9 (morbid obesity) (HCC)   Consultants:  Neurology  Procedures:  None  Antibiotics: Anti-infectives (From admission, onward)   None        Time spent: 30 minutes    Junious Silk ANP  [facility] 7 am - 330 pm/M-F for direct patient care and secure chat Please refer to Amion for contact info 5  days

## 2020-11-16 NOTE — Progress Notes (Signed)
PT Cancellation Note  Patient Details Name: Cathy Hall MRN: [id number] DOB: 09/28/1971   Cancelled Treatment:    Reason Eval/Treat Not Completed: Patient declined, no reason specified. PT attempts to see pt twice on this date. Initially pt reports dizziness and declines. PT attempts again later in afternoon and pt reports feeling better, but then reports nausea when asked to mobilize. PT will attempt to follow up as time allows.   Arlyss Gandy 11/16/2020, 4:09 PM

## 2020-11-17 LAB — GLUCOSE, CAPILLARY
Glucose-Capillary: 110 mg/dL — ABNORMAL HIGH (ref 70–99)
Glucose-Capillary: 159 mg/dL — ABNORMAL HIGH (ref 70–99)
Glucose-Capillary: 177 mg/dL — ABNORMAL HIGH (ref 70–99)
Glucose-Capillary: 214 mg/dL — ABNORMAL HIGH (ref 70–99)

## 2020-11-17 MED ORDER — DICLOFENAC SODIUM 1 % EX GEL
1.0000 "application " | Freq: Four times a day (QID) | CUTANEOUS | Status: DC
  Administered 2020-11-17 – 2020-11-30 (×47): 1 via TOPICAL
  Filled 2020-11-17: qty 100

## 2020-11-17 NOTE — Progress Notes (Signed)
Physical Therapy Treatment Patient Details Name: Cathy Hall MRN: [id number] DOB: May 03, 1971 Today's Date: 11/17/2020    History of Present Illness Pt is a 50 y/o female admitted secondary to worsening L sided weakness, difficulty managing secretions and dysarthria. Thought to be secondary to extension of recent CVA. Recently discharged secondary to CVA. PMH inclues CVA, COPD, drug abuse, PTSD.    PT Comments    Pt demonstrating progress with mobility, ambulating x3 bouts of ~30 ft each bout with a seated rest break between each. No overt LOB with gait, but trunk sway did decrease when utilizing RW during final 2 bouts. Pt even reported the RW did help alleviate R knee pain by allowing her to push up through the RW. Pt displays excessive L pelvis anterior rotation with L swing phase, possibly due to L hip flexor weakness. After gait training, focused on increasing bilat hip flexor, glut, quads, and hamstring strength with exercises to improve gait patterns and provide increased stability at the knees. Will continue to follow acutely. Current recommendations remain appropriate.   Follow Up Recommendations  SNF;Supervision/Assistance - 24 hour     Equipment Recommendations  Rolling walker with 5" wheels    Recommendations for Other Services       Precautions / Restrictions Precautions Precautions: Fall Precaution Comments: mildly impulsive Restrictions Weight Bearing Restrictions: No    Mobility  Bed Mobility Overal bed mobility: Needs Assistance Bed Mobility: Supine to Sit     Supine to sit: Modified independent (Device/Increase time)     General bed mobility comments: Pt able to come to sit with mod I using bed controls and rails.  Transfers Overall transfer level: Needs assistance Equipment used: Rolling walker (2 wheeled);None Transfers: Sit to/from Stand Sit to Stand: Min guard         General transfer comment: Extra effort powering up to stand,  cuing for proper hand placement, min guard for safety.  Ambulation/Gait Ambulation/Gait assistance: Min guard Gait Distance (Feet): 30 Feet (x3 bouts of ~30 ft each bout, seated rest break between each) Assistive device: Rolling walker (2 wheeled);None Gait Pattern/deviations: Step-through pattern;Decreased stride length;Shuffle;Staggering left (excessive anterior rotation of L pelvis with L swing phase; L foot placed wider with steps) Gait velocity: decreased Gait velocity interpretation: <1.31 ft/sec, indicative of household ambulator General Gait Details: Ambulated first bout without RW, no overt LOB, but intermittent grabbing of sink for stability. Decreased bilat step length and trunk sway, thus provided RW for next 2 bouts for stability and to assist with dec weight on R knee due to pain. Continued demonstrating excessive L pelvis anterior rotation with L swing phase despite cues to correct. No LOB, min guard for safety.   Stairs             Wheelchair Mobility    Modified Rankin (Stroke Patients Only) Modified Rankin (Stroke Patients Only) Pre-Morbid Rankin Score: Slight disability Modified Rankin: Moderately severe disability     Balance Overall balance assessment: Needs assistance Sitting-balance support: Feet supported;No upper extremity supported Sitting balance-Leahy Scale: Fair Sitting balance - Comments: Static sitting no LOB, supervision for safety.   Standing balance support: Bilateral upper extremity supported;No upper extremity supported;During functional activity Standing balance-Leahy Scale: Fair Standing balance comment: able to maintain static stance safely without support                            Cognition Arousal/Alertness: Awake/alert Behavior During Therapy: WFL for tasks assessed/performed Overall  Cognitive Status: Impaired/Different from baseline Area of Impairment: Problem solving;Attention;Safety/judgement;Awareness                    Current Attention Level: Sustained     Safety/Judgement: Decreased awareness of deficits;Decreased awareness of safety Awareness: Anticipatory Problem Solving: Slow processing;Requires verbal cues;Requires tactile cues General Comments: decreased insight into deficits and general health. Pt A&Ox4, negotiating around obstacles but difficulty managing RW in tight spaces.      Exercises General Exercises - Lower Extremity Heel Slides: Both;10 reps;Seated (knee flexion isometrics ~2-3 sec each to tolerance) Hip Flexion/Marching: Strengthening;Both;10 reps;Seated Mini-Sqauts: Strengthening;Both;5 reps (sit to stand from recliner without utilizing UEs)    General Comments        Pertinent Vitals/Pain Pain Assessment: Faces Faces Pain Scale: Hurts even more Pain Location: R knee with mobility Pain Descriptors / Indicators: Grimacing;Guarding Pain Intervention(s): Limited activity within patient's tolerance;Monitored during session;Repositioned    Home Living                      Prior Function            PT Goals (current goals can now be found in the care plan section) Acute Rehab PT Goals Patient Stated Goal: to be more independent PT Goal Formulation: With patient Time For Goal Achievement: 11/25/20 Potential to Achieve Goals: Fair Progress towards PT goals: Progressing toward goals    Frequency    Min 3X/week      PT Plan Current plan remains appropriate    Co-evaluation              AM-PAC PT "6 Clicks" Mobility   Outcome Measure  Help needed turning from your back to your side while in a flat bed without using bedrails?: None Help needed moving from lying on your back to sitting on the side of a flat bed without using bedrails?: None Help needed moving to and from a bed to a chair (including a wheelchair)?: A Little Help needed standing up from a chair using your arms (e.g., wheelchair or bedside chair)?: A Little Help needed to  walk in hospital room?: A Little Help needed climbing 3-5 steps with a railing? : Total 6 Click Score: 18    End of Session Equipment Utilized During Treatment: Gait belt Activity Tolerance: Patient tolerated treatment well Patient left: with call bell/phone within reach;in chair   PT Visit Diagnosis: Unsteadiness on feet (R26.81);Muscle weakness (generalized) (M62.81);Other abnormalities of gait and mobility (R26.89)     Time: 4008-6761 PT Time Calculation (min) (ACUTE ONLY): 18 min  Charges:  $Gait Training: 8-22 mins                     Raymond Gurney, PT, DPT Acute Rehabilitation Services  Pager: [phone] Office: [phone]    Jewel Baize 11/17/2020, 12:23 PM

## 2020-11-17 NOTE — TOC Progression Note (Signed)
Transition of Care [facility]) - Progression Note    Patient Details  Name: Kaleen Rochette MRN: [id number] Date of Birth: 12-15-1970  Transition of Care [facility]) CM/SW Contact  Baldemar Lenis, [place] Phone Number: 11/17/2020, 12:02 PM  Clinical Narrative:   PASRR interview completed with patient. Pending PASRR number at this time.    Expected Discharge Plan: Skilled Nursing Facility Barriers to Discharge:  (No insurance - will need LOG for SNF placement)  Expected Discharge Plan and Services Expected Discharge Plan: Skilled Nursing Facility In-house Referral: Clinical Social Work,Financial Counselor,PCP / Health Connect Discharge Planning Services: CM Consult Post Acute Care Choice: Skilled Nursing Facility Living arrangements for the past 2 months: Single Family Home (Patient states that she has been living with family at [address] [place], [place] [address]) Expected Discharge Date: 11/16/20                                     Social Determinants of Health (SDOH) Interventions    Readmission Risk Interventions Readmission Risk Prevention Plan 11/16/2020  Transportation Screening Complete  PCP or Specialist Appt within 3-5 Days Complete  HRI or Home Care Consult Complete  Social Work Consult for Recovery Care Planning/Counseling Complete  Palliative Care Screening Complete  Medication Review Oceanographer) Complete

## 2020-11-17 NOTE — Progress Notes (Signed)
[facility] PROGRESS NOTE  Cathy Hall FAO:[id number] DOB: 1971/03/22 DOA: 11/10/2020 PCP: Patient, No Pcp Per  Status: Remains inpatient appropriate because:Unsafe d/c plan   Dispo: The patient is from: Home              Anticipated d/c is to: SNF              Anticipated d/c date is: > 3 days              Patient currently is medically stable to d/c. Barriers to discharge. SNF recommended for short-term rehab but patient does not have a funding source. Medicaid application pending  Code Status: Full Family Communication: Patient DVT prophylaxis: Lovenox Vaccination status: Received first dose of Moderna vaccine on 1/11  Foley catheter: No  HPI:  50 y.o. F with obesity, cocaine use, COPD, and HTN recently admitted for new stroke, who returned with symptoms of dysarthria, difficulty managing secretions and worsening left sided weakness.  Was admitted from 1/1 to 1/5 for new R thalamic and L midbrain stroke with resulting left sided weakness.  On the day of discharge, patient was home for a few hours when she returned to the ER.  In the ER, CTA showed no LVO.  MRI brain showed multiple new bilateral subcortical white matter infarcts.     Subjective: No further malaise reported.  States cough has improved with increased frequency/availability of albuterol MDI.  Patient directed that she must consistently participate with PT in order to be eligible for rehab at [facility].  Objective: Vitals:   11/17/20 0800 11/17/20 1227  BP: 117/69 132/63  Pulse: 78 79  Resp: 18 16  Temp: 98.3 F (36.8 C) 98 F (36.7 C)  SpO2: 100% 100%    Intake/Output Summary (Last 24 hours) at 11/17/2020 1257 Last data filed at 11/16/2020 1500 Gross per 24 hour  Intake 240 ml  Output -  Net 240 ml   There were no vitals filed for this visit.  Exam:  Constitutional: NAD, calm, alert Respiratory: Stable on room air, bilateral lung sounds clear to auscultation without  wheezing Cardiovascular: Regular rate and rhythm.  S1-S2.  no extremity edema.  Extremities warm with appropriate capillary refill Abdomen: Soft and nontender, normoactive bowel sounds . LBM 1/12 Musculoskeletal: no clubbing / cyanosis.Good ROM, Normal muscle tone.  Skin: no rashes, lesions, ulcers. No induration Neurologic: CN 2-12 grossly intact. Sensation intact, DTR normal. Strength 5/5 on the right with UEs strength 4/5 in LLE strength 3/5 Psychiatric: Normal judgment and insight. Alert and oriented x 3. Normal mood.    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   -CTA head and neck was repeated on readmission with no new large vessel occlusion or high grade stenosis.   -Echo not repeated given had just been completed. -Continue aspirin and Plavix -Continue atorvastatin -SLP had recommended adjusted diet based on mild dysphagia but patient has not been adhering and refusing to eat appropriate diet therefore diet advanced slowly by SLP  Asthma/acute chest congestion -Not hypoxic -Negative for COVID on 1/11 therefore isolation discontinued -Continue albuterol HFA to every 4 hours as needed -1/12 chest x-ray unremarkable  Hypertension -Continue amlodipine -Echocardiogram last admission with preserved LV function and no evidence of diastolic dysfunction  Depression -Continue Prozac, Zyprexa -Last year when patient's son was murdered and her twin sister died less than  12 months ago  Neuropathy -Continue gabapentin  Other problems: GERD Obesity BMI >30 -Continue pantoprazole   Data Reviewed: Basic Metabolic Panel: Recent Labs  Lab 11/10/20 1953 11/10/20 2017 11/11/20 0156 11/12/20 0433 11/15/20 0451  NA 138 140  --  139 138  K 4.5 4.5  --  3.8 4.3  CL 100 104  --  104 101   CO2 22  --   --  25 26  GLUCOSE 103* 101*  --  102* 122*  BUN 20 23*  --  15 14  CREATININE 1.30* 1.20* 1.03* 0.90 1.01*  CALCIUM 9.5  --   --  8.9 9.5   Liver Function Tests: Recent Labs  Lab 11/10/20 1953  AST 39  ALT 22  ALKPHOS 74  BILITOT 0.8  PROT 8.1  ALBUMIN 4.2   No results for input(s): LIPASE, AMYLASE in the last 168 hours. No results for input(s): AMMONIA in the last 168 hours. CBC: Recent Labs  Lab 11/10/20 1953 11/10/20 2017 11/11/20 0156 11/12/20 0433 11/15/20 0451  WBC 13.8*  --  12.6* 9.5 9.1  NEUTROABS 10.6*  --   --   --   --   HGB 15.3* 16.7* 14.4 13.9 14.0  HCT 49.1* 49.0* 45.6 43.5 44.0  MCV 95.5  --  94.6 93.5 94.2  PLT 259  --  247 226 257   Cardiac Enzymes: No results for input(s): CKTOTAL, CKMB, CKMBINDEX, TROPONINI in the last 168 hours. BNP (last 3 results) No results for input(s): BNP in the last 8760 hours.  ProBNP (last 3 results) No results for input(s): PROBNP in the last 8760 hours.  CBG: Recent Labs  Lab 11/16/20 0617 11/16/20 1204 11/17/20 0029 11/17/20 0610 11/17/20 1226  GLUCAP 125* 117* 214* 110* 159*    Recent Results (from the past 240 hour(s))  SARS CORONAVIRUS 2 (TAT 6-24 HRS) Nasopharyngeal Nasopharyngeal Swab     Status: None   Collection Time: 11/10/20 11:43 PM   Specimen: Nasopharyngeal Swab  Result Value Ref Range Status   SARS Coronavirus 2 NEGATIVE NEGATIVE Final    Comment: (NOTE) SARS-CoV-2 target nucleic acids are NOT DETECTED.  The SARS-CoV-2 RNA is generally detectable in upper and lower respiratory specimens during the acute phase of infection. Negative results do not preclude SARS-CoV-2 infection, do not rule out co-infections with other pathogens, and should not be used as the sole basis for treatment or other patient management decisions. Negative results must be combined with clinical observations, patient history, and epidemiological information. The expected result is Negative.  Fact  Sheet for Patients: HairSlick.no  Fact Sheet for Healthcare Providers: quierodirigir.com  This test is not yet approved or cleared by the [place] FDA and  has been authorized for detection and/or diagnosis of SARS-CoV-2 by FDA under an Emergency Use Authorization (EUA). This EUA will remain  in effect (meaning this test can be used) for the duration of the COVID-19 declaration under Se ction 564(b)(1) of the Act, 21 U.S.C. section 360bbb-3(b)(1), unless the authorization is terminated or revoked sooner.  Performed at [facility], [address] [place], [place] [address]   SARS CORONAVIRUS 2 (TAT 6-24 HRS) Nasopharyngeal Nasopharyngeal Swab     Status: None   Collection Time: 11/15/20 10:38 AM   Specimen: Nasopharyngeal Swab  Result Value Ref Range Status   SARS Coronavirus 2 NEGATIVE NEGATIVE Final    Comment: (NOTE) SARS-CoV-2 target nucleic acids are NOT DETECTED.  The SARS-CoV-2 RNA is generally detectable in upper  and lower respiratory specimens during the acute phase of infection. Negative results do not preclude SARS-CoV-2 infection, do not rule out co-infections with other pathogens, and should not be used as the sole basis for treatment or other patient management decisions. Negative results must be combined with clinical observations, patient history, and epidemiological information. The expected result is Negative.  Fact Sheet for Patients: HairSlick.no  Fact Sheet for Healthcare Providers: quierodirigir.com  This test is not yet approved or cleared by the [place] FDA and  has been authorized for detection and/or diagnosis of SARS-CoV-2 by FDA under an Emergency Use Authorization (EUA). This EUA will remain  in effect (meaning this test can be used) for the duration of the COVID-19 declaration under Se ction 564(b)(1) of the Act, 21  U.S.C. section 360bbb-3(b)(1), unless the authorization is terminated or revoked sooner.  Performed at [facility], [address] [place], [place] [address]      Studies: DG Chest 2 View  Result Date: 11/16/2020 CLINICAL DATA:  Congestion.  Recent stroke.  COPD.  Asthma. EXAM: CHEST - 2 VIEW COMPARISON:  11/11/2018 FINDINGS: Lateral view degraded by patient arm position. Midline trachea. Normal heart size for level of inspiration. No pleural effusion or pneumothorax. Clear lungs. IMPRESSION: No active cardiopulmonary disease. Electronically Signed   By: Jeronimo Greaves M.D.   On: 11/16/2020 10:40    Scheduled Meds: . amLODipine  5 mg Oral Daily  . aspirin  300 mg Rectal Daily   Or  . aspirin  325 mg Oral Daily  . atorvastatin  40 mg Oral Daily  . clopidogrel  75 mg Oral Daily  . enoxaparin (LOVENOX) injection  40 mg Subcutaneous Q24H  . FLUoxetine  10 mg Oral Daily  . gabapentin  100 mg Oral BID  . multivitamin with minerals  1 tablet Oral Daily  . OLANZapine  5 mg Oral QHS  . pantoprazole  40 mg Oral Daily   Continuous Infusions:  Principal Problem:   Acute CVA (cerebrovascular accident) (HCC) Active Problems:   Cocaine use disorder, moderate, dependence (HCC)   Essential hypertension   ARF (acute renal failure) (HCC)   Obesity, Class III, BMI 40-49.9 (morbid obesity) (HCC)   Consultants:  Neurology  Procedures:  None  Antibiotics: Anti-infectives (From admission, onward)   None       Time spent: 30 minutes    Junious Silk ANP  [facility] 7 am - 330 pm/M-F for direct patient care and secure chat Please refer to Amion for contact info 6  days

## 2020-11-18 LAB — GLUCOSE, CAPILLARY
Glucose-Capillary: 124 mg/dL — ABNORMAL HIGH (ref 70–99)
Glucose-Capillary: 104 mg/dL — ABNORMAL HIGH (ref 70–99)
Glucose-Capillary: 125 mg/dL — ABNORMAL HIGH (ref 70–99)
Glucose-Capillary: 175 mg/dL — ABNORMAL HIGH (ref 70–99)

## 2020-11-18 LAB — CREATININE, SERUM
Creatinine, Ser: 0.77 mg/dL (ref 0.44–1.00)
GFR, Estimated: >60 mL/min (ref >60)

## 2020-11-18 MED ORDER — BENZOCAINE 10 % MT GEL
Freq: Four times a day (QID) | OROMUCOSAL | Status: DC | PRN
  Filled 2020-11-18 (×2): qty 9

## 2020-11-18 MED ORDER — IBUPROFEN 200 MG PO TABS
400.0000 mg | ORAL_TABLET | ORAL | Status: AC | PRN
  Administered 2020-11-18 – 2020-11-19 (×3): 400 mg via ORAL
  Filled 2020-11-18 (×3): qty 2

## 2020-11-18 MED ORDER — KETOROLAC TROMETHAMINE 30 MG/ML IJ SOLN
30.0000 mg | Freq: Once | INTRAMUSCULAR | Status: AC
  Administered 2020-11-18: 30 mg via INTRAVENOUS
  Filled 2020-11-18 (×2): qty 1

## 2020-11-18 NOTE — Progress Notes (Signed)
Physical Therapy Treatment Patient Details Name: Cathy Hall MRN: [id number] DOB: [birth year] Today's Date: 11/18/2020    History of Present Illness Pt is a 50 y/o female admitted secondary to worsening L sided weakness, difficulty managing secretions and dysarthria. Thought to be secondary to extension of recent CVA. Recently discharged secondary to CVA. PMH inclues CVA, COPD, drug abuse, PTSD.    PT Comments    Pt making progress, ambulating up to ~66 ft during her longest gait bout of 4 bouts, with seated rest break between each. Pt continues to be limited primarily by knee pain from instability (hx of ACL tear on R per pt). Cued pt to decrease excessive R pelvis internal rotation with swing, with mod success. Focused remainder of session on improving bil knee stability by strengthening muscles surrounding knee joints to tolerance in seated position. Will continue to follow acutely. Current recommendations remain appropriate.  Follow Up Recommendations  SNF;Supervision/Assistance - 24 hour     Equipment Recommendations  Rolling walker with 5" wheels    Recommendations for Other Services       Precautions / Restrictions Precautions Precautions: Fall Precaution Comments: mildly impulsive    Mobility  Bed Mobility Overal bed mobility: Needs Assistance Bed Mobility: Supine to Sit     Supine to sit: Modified independent (Device/Increase time)     General bed mobility comments: Pt able to come to sit with mod I using bed controls and rails.  Transfers Overall transfer level: Needs assistance Equipment used: Rolling walker (2 wheeled);None Transfers: Sit to/from Stand Sit to Stand: Min guard         General transfer comment: Extra effort powering up to stand, min guard for safety.  Ambulation/Gait Ambulation/Gait assistance: Min guard Gait Distance (Feet): 66 Feet (x4 of ~15 ft > ~10 ft > ~66 ft > ~54 ft) Assistive device: Rolling walker (2  wheeled);None Gait Pattern/deviations: Step-through pattern;Decreased stride length;Shuffle (excessive anterior rotation of L pelvis with L swing phase; L foot placed wider with steps) Gait velocity: decreased Gait velocity interpretation: <1.31 ft/sec, indicative of household ambulator General Gait Details: Ambulates at slow pace with RW to allow for unweight on knees 2/2 pain. Pt reports knee buckling 1x, but did not note it. No overt LOB, min guard for safety. Cued pt to concenrtrate on contracting muscles during stance for knee stability and to improve hip flexion with L swing ratehr than internally rotate L pelvis, mod success.   Stairs             Wheelchair Mobility    Modified Rankin (Stroke Patients Only) Modified Rankin (Stroke Patients Only) Pre-Morbid Rankin Score: Slight disability Modified Rankin: Moderately severe disability     Balance Overall balance assessment: Needs assistance Sitting-balance support: Feet supported;No upper extremity supported Sitting balance-Leahy Scale: Fair Sitting balance - Comments: Static sitting no LOB, supervision for safety.   Standing balance support: Bilateral upper extremity supported;No upper extremity supported;During functional activity Standing balance-Leahy Scale: Fair Standing balance comment: able to maintain static stance safely without support. reaches off BOS minimally to wash hands at sink.                            Cognition Arousal/Alertness: Awake/alert Behavior During Therapy: WFL for tasks assessed/performed Overall Cognitive Status: Impaired/Different from baseline Area of Impairment: Problem solving;Attention;Safety/judgement;Awareness                   Current Attention Level: Sustained  Safety/Judgement: Decreased awareness of deficits;Decreased awareness of safety Awareness: Anticipatory Problem Solving: Slow processing;Requires verbal cues;Requires tactile cues General  Comments: decreased insight into deficits and general health. Pt A&Ox4, negotiating around obstacles but difficulty managing RW in tight spaces.      Exercises General Exercises - Lower Extremity Long Arc Quad: Strengthening;Both;10 reps;Seated Heel Slides: Both;10 reps;Seated (knee flexion isometrics ~2-3 sec each to tolerance) Hip ABduction/ADduction: Strengthening;Both;10 reps;Seated (isometrics hip abduction against gait belt around thighs and hip adduction pillow squeezes, 2-3 sec each rep to tolerance) Hip Flexion/Marching: Strengthening;Both;10 reps;Seated (against resistance of gait belt around bil thighs)    General Comments        Pertinent Vitals/Pain Pain Assessment: 0-10 Faces Pain Scale: Hurts even more Pain Location: R knee and R hip with mobility Pain Descriptors / Indicators: Grimacing;Guarding;Aching;Throbbing Pain Intervention(s): Limited activity within patient's tolerance;Monitored during session;Repositioned    Home Living                      Prior Function            PT Goals (current goals can now be found in the care plan section) Acute Rehab PT Goals Patient Stated Goal: "do more work" on the R knee PT Goal Formulation: With patient Time For Goal Achievement: 11/25/20 Potential to Achieve Goals: Fair Progress towards PT goals: Progressing toward goals    Frequency    Min 3X/week      PT Plan Current plan remains appropriate    Co-evaluation              AM-PAC PT "6 Clicks" Mobility   Outcome Measure  Help needed turning from your back to your side while in a flat bed without using bedrails?: None Help needed moving from lying on your back to sitting on the side of a flat bed without using bedrails?: None Help needed moving to and from a bed to a chair (including a wheelchair)?: A Little Help needed standing up from a chair using your arms (e.g., wheelchair or bedside chair)?: A Little Help needed to walk in hospital  room?: A Little Help needed climbing 3-5 steps with a railing? : Total 6 Click Score: 18    End of Session Equipment Utilized During Treatment: Gait belt Activity Tolerance: Patient tolerated treatment well Patient left: with call bell/phone within reach;in chair   PT Visit Diagnosis: Unsteadiness on feet (R26.81);Muscle weakness (generalized) (M62.81);Other abnormalities of gait and mobility (R26.89)     Time: 1287-8676 PT Time Calculation (min) (ACUTE ONLY): 31 min  Charges:  $Gait Training: 8-22 mins $Therapeutic Exercise: 8-22 mins                     Raymond Gurney, PT, DPT Acute Rehabilitation Services  Pager: [phone] Office: [phone]    Jewel Baize 11/18/2020, 5:37 PM

## 2020-11-18 NOTE — TOC Progression Note (Signed)
Transition of Care [facility]) - Progression Note    Patient Details  Name: Cathy Hall MRN: [id number] Date of Birth: 01-Dec-1970  [facility] CM/SW Contact  Janae Bridgeman, RN Phone Number: 11/18/2020, 1:58 PM  Clinical Narrative:    Case management called and left a message with [facility] in [place] to inquire if the facility would accept the patient for admission under an LOC offered by [facility] at [facility].  Message left with Rosey Bath, CM in Admissions and clinicals faxed to [phone].  Case management will continue to follow for SNF admission for short term rehabilitation.   Expected Discharge Plan: Skilled Nursing Facility Barriers to Discharge: No SNF bed,Continued Medical Work up,Inadequate or no insurance  Expected Discharge Plan and Services Expected Discharge Plan: Skilled Nursing Facility In-house Referral: Clinical Social Work,Financial [employer] / Management consultant Discharge Planning Services: CM Consult Post Acute Care Choice: Skilled Nursing Facility Living arrangements for the past 2 months: Single Family Home Expected Discharge Date: 11/16/20                                     Social Determinants of Health (SDOH) Interventions    Readmission Risk Interventions Readmission Risk Prevention Plan 11/16/2020  Transportation Screening Complete  PCP or Specialist Appt within 3-5 Days Complete  HRI or Home Care Consult Complete  Social Work Consult for Recovery Care Planning/Counseling Complete  Palliative Care Screening Complete  Medication Review Oceanographer) Complete

## 2020-11-18 NOTE — Progress Notes (Signed)
Occupational Therapy Treatment Patient Details Name: Cathy Hall MRN: [id number] DOB: May 17, 1971 Today's Date: 11/18/2020    History of present illness Pt is a 50 y/o female admitted secondary to worsening L sided weakness, difficulty managing secretions and dysarthria. Thought to be secondary to extension of recent CVA. Recently discharged secondary to CVA. PMH inclues CVA, COPD, drug abuse, PTSD.   OT comments  OT treatment session with focus on self-care re-education, ADL transfers, safety awareness, and OOB activity tolerance. Patient noting pain in R knee 2/2 ACL injury and L side requesting pain meds. RN notified. Patient completed 3/3 parts of toileting task, grooming standing at sink level and short-distance functional mobility in room without use of AD and Min guard to supervision A. Patient continues to be limited by decreased insight, decreased awareness, impulsivity and decreased balance demonstrating high fall risk. Continued recommendation for SNF rehab. OT will continue to follow acutely.    Follow Up Recommendations  SNF;Supervision/Assistance - 24 hour    Equipment Recommendations  Other (comment) (TBD)    Recommendations for Other Services      Precautions / Restrictions Precautions Precautions: Fall Precaution Comments: Impulsive Restrictions Weight Bearing Restrictions: No       Mobility Bed Mobility Overal bed mobility: Modified Independent                Transfers Overall transfer level: Needs assistance Equipment used: None Transfers: Sit to/from Stand Sit to Stand: Min guard;Supervision         General transfer comment: Min guard for sit to stand from EOB x multiple trials and from standard commode with Min guard to supervision A and cues for safety.    Balance Overall balance assessment: Needs assistance Sitting-balance support: Feet supported;No upper extremity supported Sitting balance-Leahy Scale: Good Sitting balance -  Comments: Maintains static sitting balance at EOB during functional tasks without LOB.   Standing balance support: No upper extremity supported;During functional activity Standing balance-Leahy Scale: Fair Standing balance comment: Able to maintain standing balance during functional tasks without UE support or use of AD.                           ADL either performed or assessed with clinical judgement   ADL                           Toilet Transfer: Min guard;Cueing for safety Toilet Transfer Details (indicate cue type and reason): No physical assist needed. Patient very impulsive walking to bathroom before therapist was ready for patient to do so. Toileting- Clothing Manipulation and Hygiene: Supervision/safety Toileting - Clothing Manipulation Details (indicate cue type and reason): 3/3 parts of toileting task with supervision A for safety.     Functional mobility during ADLs: Supervision/safety General ADL Comments: Supervision A for short-distance functional mobility in room without AD. Cues for safety awareness 2/2 impulsivity.     Vision       Perception     Praxis      Cognition Arousal/Alertness: Awake/alert Behavior During Therapy: WFL for tasks assessed/performed Overall Cognitive Status: Impaired/Different from baseline Area of Impairment: Problem solving;Attention;Safety/judgement;Awareness                   Current Attention Level: Sustained     Safety/Judgement: Decreased awareness of deficits;Decreased awareness of safety Awareness: Anticipatory Problem Solving: Slow processing;Requires verbal cues;Requires tactile cues General Comments: Decreased awareness of deficits, decreased safety  awareness.        Exercises     Shoulder Instructions       General Comments Dizziness upon sitting at EOB. BP 100/110.    Pertinent Vitals/ Pain       Pain Assessment: 0-10 Pain Score: 7  Pain Location: R knee with mobility and L  side Pain Descriptors / Indicators: Grimacing;Guarding Pain Intervention(s): Limited activity within patient's tolerance;Monitored during session;Patient requesting pain meds-RN notified  Home Living                                          Prior Functioning/Environment              Frequency  Min 2X/week        Progress Toward Goals  OT Goals(current goals can now be found in the care plan section)  Progress towards OT goals: Progressing toward goals  Acute Rehab OT Goals Patient Stated Goal: to be more independent OT Goal Formulation: With patient Time For Goal Achievement: 11/26/20 Potential to Achieve Goals: Good ADL Goals Pt Will Perform Grooming: with modified independence;standing Pt Will Perform Upper Body Dressing: with modified independence Pt Will Perform Lower Body Dressing: with modified independence;sit to/from stand;with adaptive equipment Pt Will Transfer to Toilet: with modified independence;bedside commode;regular height toilet;ambulating Pt Will Perform Toileting - Clothing Manipulation and hygiene: with modified independence;sit to/from stand  Plan Discharge plan remains appropriate;Frequency remains appropriate    Co-evaluation                 AM-PAC OT "6 Clicks" Daily Activity     Outcome Measure   Help from another person eating meals?: None Help from another person taking care of personal grooming?: A Little Help from another person toileting, which includes using toliet, bedpan, or urinal?: A Lot Help from another person bathing (including washing, rinsing, drying)?: A Little Help from another person to put on and taking off regular upper body clothing?: None Help from another person to put on and taking off regular lower body clothing?: A Little 6 Click Score: 19    End of Session Equipment Utilized During Treatment: Gait belt  OT Visit Diagnosis: Unsteadiness on feet (R26.81);Other symptoms and signs involving  cognitive function   Activity Tolerance Patient tolerated treatment well   Patient Left in bed;with call bell/phone within reach;with bed alarm set   Nurse Communication Mobility status;Patient requests pain meds        Time: 1121-1139 OT Time Calculation (min): 18 min  Charges: OT General Charges $OT Visit: 1 Visit OT Treatments $Self Care/Home Management : 8-22 mins  Zamaya Rapaport H. OTR/L Supplemental OT, Department of rehab services [phone]   Marci Polito R H. 11/18/2020, 12:29 PM

## 2020-11-18 NOTE — Progress Notes (Signed)
[facility] PROGRESS NOTE  Cathy Hall CBJ:[id number] DOB: Dec 15, 1970 DOA: 11/10/2020 PCP: Patient, No Pcp Per  Status: Remains inpatient appropriate because:Unsafe d/c plan   Dispo: The patient is from: Home              Anticipated d/c is to: SNF              Anticipated d/c date is: > 3 days              Patient currently is medically stable to d/c. Barriers to discharge. SNF recommended for short-term rehab but patient does not have a funding source. Medicaid application pending.  Plan is to contact [facility] in [place] since they do accept LOGs  Code Status: Full Family Communication: Patient DVT prophylaxis: Lovenox Vaccination status: Received first dose of Moderna vaccine on 1/11  Foley catheter: No  HPI:  50 y.o. F with obesity, cocaine use, COPD, and HTN recently admitted for new stroke, who returned with symptoms of dysarthria, difficulty managing secretions and worsening left sided weakness.  Was admitted from 1/1 to 1/5 for new R thalamic and L midbrain stroke with resulting left sided weakness.  On the day of discharge, patient was home for a few hours when she returned to the ER.  In the ER, CTA showed no LVO.  MRI brain showed multiple new bilateral subcortical white matter infarcts.     Subjective: No complaints.  Reports breathing still improved after utilization of as needed MDI  Objective: Vitals:   11/18/20 0911 11/18/20 1157  BP: 135/70 100/68  Pulse: 87 80  Resp: 18 18  Temp: 98.2 F (36.8 C) 97.9 F (36.6 C)  SpO2: 95% 100%   No intake or output data in the 24 hours ending 11/18/20 1306 There were no vitals filed for this visit.  Exam:  Constitutional: NAD, calm, alert Respiratory: Lungs clear, stable on room air Cardiovascular: Normal S1-S2, pulses regular, extremities warm to touch Abdomen: Soft and nontender, normoactive bowel sounds . LBM 1/14 Musculoskeletal: .Good ROM, Normal muscle tone.  Skin:  no rashes, lesions, ulcers. No induration Neurologic: CN 2-12 grossly intact. Sensation intact, DTR normal. Strength 5/5 on the right with UEs strength 4/5 in LLE strength 3/5 Psychiatric: Normal judgment and insight. Alert and oriented x 3. Normal mood.    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   -CTA head and neck was repeated on readmission with no new large vessel occlusion or high grade stenosis.   -Echo not repeated given had just been completed. -Continue aspirin and Plavix -Continue atorvastatin -SLP had recommended adjusted diet based on mild dysphagia but patient has not been adhering and refusing to eat appropriate diet therefore diet advanced slowly by SLP -Dissipating better with PT and await SNF bed offer for short-term rehab  Asthma/acute chest congestion -Not hypoxic -Negative for COVID on 1/11 therefore isolation discontinued -Continue albuterol HFA to every 4 hours as needed -1/12 chest x-ray unremarkable  Hypertension -Continue amlodipine -Echocardiogram last admission with preserved LV function and no evidence of diastolic dysfunction  Depression -Continue Prozac, Zyprexa -Last year when patient's son was murdered and her twin sister died less than 12 months ago  Neuropathy -Continue gabapentin  Other problems: GERD Obesity BMI >30 -Continue pantoprazole   Data Reviewed: Basic Metabolic Panel: Recent Labs  Lab 11/12/20 0433 11/15/20 0451 11/18/20  0433  NA 139 138  --   K 3.8 4.3  --   CL 104 101  --   CO2 25 26  --   GLUCOSE 102* 122*  --   BUN 15 14  --   CREATININE 0.90 1.01* 0.77  CALCIUM 8.9 9.5  --    Liver Function Tests: No results for input(s): AST, ALT, ALKPHOS, BILITOT, PROT, ALBUMIN in the last 168 hours. No results for input(s):  LIPASE, AMYLASE in the last 168 hours. No results for input(s): AMMONIA in the last 168 hours. CBC: Recent Labs  Lab 11/12/20 0433 11/15/20 0451  WBC 9.5 9.1  HGB 13.9 14.0  HCT 43.5 44.0  MCV 93.5 94.2  PLT 226 257   Cardiac Enzymes: No results for input(s): CKTOTAL, CKMB, CKMBINDEX, TROPONINI in the last 168 hours. BNP (last 3 results) No results for input(s): BNP in the last 8760 hours.  ProBNP (last 3 results) No results for input(s): PROBNP in the last 8760 hours.  CBG: Recent Labs  Lab 11/17/20 1226 11/17/20 1905 11/18/20 0118 11/18/20 0522 11/18/20 1155  GLUCAP 159* 177* 124* 104* 125*    Recent Results (from the past 240 hour(s))  SARS CORONAVIRUS 2 (TAT 6-24 HRS) Nasopharyngeal Nasopharyngeal Swab     Status: None   Collection Time: 11/10/20 11:43 PM   Specimen: Nasopharyngeal Swab  Result Value Ref Range Status   SARS Coronavirus 2 NEGATIVE NEGATIVE Final    Comment: (NOTE) SARS-CoV-2 target nucleic acids are NOT DETECTED.  The SARS-CoV-2 RNA is generally detectable in upper and lower respiratory specimens during the acute phase of infection. Negative results do not preclude SARS-CoV-2 infection, do not rule out co-infections with other pathogens, and should not be used as the sole basis for treatment or other patient management decisions. Negative results must be combined with clinical observations, patient history, and epidemiological information. The expected result is Negative.  Fact Sheet for Patients: HairSlick.no  Fact Sheet for Healthcare Providers: quierodirigir.com  This test is not yet approved or cleared by the [place] FDA and  has been authorized for detection and/or diagnosis of SARS-CoV-2 by FDA under an Emergency Use Authorization (EUA). This EUA will remain  in effect (meaning this test can be used) for the duration of the COVID-19 declaration under Se ction 564(b)(1) of  the Act, 21 U.S.C. section 360bbb-3(b)(1), unless the authorization is terminated or revoked sooner.  Performed at [facility], [address] [place], [place] [address]   SARS CORONAVIRUS 2 (TAT 6-24 HRS) Nasopharyngeal Nasopharyngeal Swab     Status: None   Collection Time: 11/15/20 10:38 AM   Specimen: Nasopharyngeal Swab  Result Value Ref Range Status   SARS Coronavirus 2 NEGATIVE NEGATIVE Final    Comment: (NOTE) SARS-CoV-2 target nucleic acids are NOT DETECTED.  The SARS-CoV-2 RNA is generally detectable in upper and lower respiratory specimens during the acute phase of infection. Negative results do not preclude SARS-CoV-2 infection, do not rule out co-infections with other pathogens, and should not be used as the sole basis for treatment or other patient management decisions. Negative results must be combined with clinical observations, patient history, and epidemiological information. The expected result is Negative.  Fact Sheet for Patients: HairSlick.no  Fact Sheet for Healthcare Providers: quierodirigir.com  This test is not yet approved or cleared by the [place] FDA and  has been authorized for detection and/or diagnosis of SARS-CoV-2 by FDA under an Emergency Use Authorization (EUA). This EUA will remain  in effect (meaning this test can be used) for the duration of the COVID-19 declaration under Se ction 564(b)(1) of the Act, 21 U.S.C. section 360bbb-3(b)(1), unless the authorization is terminated or revoked sooner.  Performed at [facility], [address] [place], [place] [address]      Studies: No results found.  Scheduled Meds: . amLODipine  5 mg Oral Daily  . aspirin  300 mg Rectal Daily   Or  . aspirin  325 mg Oral Daily  . atorvastatin  40 mg Oral Daily  . clopidogrel  75 mg Oral Daily  . diclofenac Sodium  1 application Topical QID  . enoxaparin (LOVENOX) injection   40 mg Subcutaneous Q24H  . FLUoxetine  10 mg Oral Daily  . gabapentin  100 mg Oral BID  . ketorolac  30 mg Intravenous Once  . multivitamin with minerals  1 tablet Oral Daily  . OLANZapine  5 mg Oral QHS  . pantoprazole  40 mg Oral Daily   Continuous Infusions:  Principal Problem:   Acute CVA (cerebrovascular accident) (HCC) Active Problems:   Cocaine use disorder, moderate, dependence (HCC)   Essential hypertension   ARF (acute renal failure) (HCC)   Obesity, Class III, BMI 40-49.9 (morbid obesity) (HCC)   Consultants:  Neurology  Procedures:  None  Antibiotics: Anti-infectives (From admission, onward)   None       Time spent: 30 minutes    Junious Silk ANP  [facility] 7 am - 330 pm/M-F for direct patient care and secure chat Please refer to Amion for contact info 7  days

## 2020-11-19 LAB — GLUCOSE, CAPILLARY
Glucose-Capillary: 106 mg/dL — ABNORMAL HIGH (ref 70–99)
Glucose-Capillary: 110 mg/dL — ABNORMAL HIGH (ref 70–99)
Glucose-Capillary: 138 mg/dL — ABNORMAL HIGH (ref 70–99)
Glucose-Capillary: 139 mg/dL — ABNORMAL HIGH (ref 70–99)
Glucose-Capillary: 148 mg/dL — ABNORMAL HIGH (ref 70–99)

## 2020-11-19 MED ORDER — FLUTICASONE PROPIONATE 50 MCG/ACT NA SUSP
1.0000 | Freq: Every day | NASAL | Status: DC
  Administered 2020-11-19 – 2020-11-24 (×6): 1 via NASAL
  Filled 2020-11-19: qty 16

## 2020-11-19 MED ORDER — GABAPENTIN 100 MG PO CAPS
200.0000 mg | ORAL_CAPSULE | Freq: Two times a day (BID) | ORAL | Status: DC
  Administered 2020-11-19 – 2020-11-22 (×6): 200 mg via ORAL
  Filled 2020-11-19 (×6): qty 2

## 2020-11-19 NOTE — Progress Notes (Signed)
PROGRESS NOTE    Cathy Hall  ZOX:[id number] DOB: 1971-09-16 DOA: 11/10/2020 PCP: Patient, No Pcp Per    Brief Narrative:  Patient is a 50 year old female with obesity, cocaine and marijuana use, COPD and hypertension who was recently admitted with new stroke, went home and came back with worsening dysarthria and difficulty managing secretions as well as worsening left-sided weakness. 1/1-1/5, admitted with new right thalamic and left midbrain stroke with left hemiparesis.  Was at home for a few hours and came back. In the emergency room CTA showed no large vessel occlusion.  MRI of the brain showed multiple new bilateral subcortical white matter infarcts.  Since then she is admitted to the hospital, waiting for placement at the skilled nursing facility.   Assessment & Plan:   Principal Problem:   Acute CVA (cerebrovascular accident) (HCC) Active Problems:   Cocaine use disorder, moderate, dependence (HCC)   Essential hypertension   ARF (acute renal failure) (HCC)   Obesity, Class III, BMI 40-49.9 (morbid obesity) (HCC)  Acute recurrent right thalamic stroke, multiple new white matter infarcts: Clinical findings, dysarthria and left hemiparesis.  Recurrent symptoms.  Clinically stabilizing. CT head findings, negative. MRI of the brain, as above.  Acute right thalamic infarct and multiple new white matter infarcts. CTA of the head and neck, no new large vessel occlusion or high-grade stenosis. 2D echocardiogram, normal ejection fraction.  No source of embolism. Antiplatelet therapy, on aspirin and Plavix at home, resumed. LDL, already on atorvastatin.  Continued. Hemoglobin A1c, 5.5.  No indication for treatment. DVT prophylaxis, Lovenox subcu. Therapy recommendations, PT and OT working with patient, recommended skilled nursing facility placement. Secondary risk factor modification including Cocaine and marijuana use, extensive counseling and cessation  counseling. Morbid obesity and suspected sleep apnea, I updated neurology that she is interested to enroll into sleep study.  Mild intermittent asthma: Patient has both inspiratory and expiratory wheezing and upper airway sound.  Will use some Flonase.  Albuterol inhaler with some help.  No evidence of pneumonia. Patient obviously high risk of sleep apnea, will benefit with in-house testing.  If unable, will refer to outpatient testing.  Essential hypertension: Blood pressure stable on amlodipine.  Continue.  Depression: On Prozac and Zyprexa.  Complains of neuropathic pain.  Will increase dose of gabapentin today.   DVT prophylaxis: enoxaparin (LOVENOX) injection 40 mg Start: 11/11/20 1000   Code Status: Full code Family Communication: None today Disposition Plan: Status is: Inpatient  Remains inpatient appropriate because:Unsafe d/c plan and Inpatient level of care appropriate due to severity of illness   Dispo: The patient is from: Home              Anticipated d/c is to: SNF              Anticipated d/c date is: 1 day              Patient currently is medically stable to d/c.         Consultants:   Neurology  Procedures:   None  Antimicrobials:   None   Subjective: Patient seen and examined.  No overnight events.  She continues to have a stuffy nose.  She wants to enroll into the sleep study that neurologist has recommended.  She was able to mobilize some today, however she is still unsteady. Complaints of left facial pain.  Objective: Vitals:   11/18/20 2337 11/19/20 0433 11/19/20 0846 11/19/20 1209  BP: (!) 142/81 134/84 115/66 113/72  Pulse: 92  96 80 81  Resp: 18 18 19 18   Temp: 98.9 F (37.2 C) 98.5 F (36.9 C) (!) 97.5 F (36.4 C) 98 F (36.7 C)  TempSrc: Oral Oral Oral Oral  SpO2: 93% 98% 99% 100%  Height:        Intake/Output Summary (Last 24 hours) at 11/19/2020 1441 Last data filed at 11/19/2020 0843 Gross per 24 hour  Intake 714 ml   Output -  Net 714 ml   There were no vitals filed for this visit.  Examination:  General exam: Appears calm and comfortable , comfortably eating lunch. Respiratory system: Clear to auscultation. Respiratory effort normal.  No added sounds. Cardiovascular system: S1 & S2 heard, RRR. No JVD, murmurs, rubs, gallops or clicks. No pedal edema. Gastrointestinal system: Abdomen is nondistended, soft and nontender. No organomegaly or masses felt. Normal bowel sounds heard. Central nervous system: Alert and oriented.  Patient has mild left facial droop. Left upper and lower extremity 4/5, right is normal. Psychiatry: Judgement and insight appear normal. Mood & affect appropriate.     Data Reviewed: I have personally reviewed following labs and imaging studies  CBC: Recent Labs  Lab 11/15/20 0451  WBC 9.1  HGB 14.0  HCT 44.0  MCV 94.2  PLT 257   Basic Metabolic Panel: Recent Labs  Lab 11/15/20 0451 11/18/20 0433  NA 138  --   K 4.3  --   CL 101  --   CO2 26  --   GLUCOSE 122*  --   BUN 14  --   CREATININE 1.01* 0.77  CALCIUM 9.5  --    GFR: Estimated Creatinine Clearance: 94.8 mL/min (by C-G formula based on SCr of 0.77 mg/dL). Liver Function Tests: No results for input(s): AST, ALT, ALKPHOS, BILITOT, PROT, ALBUMIN in the last 168 hours. No results for input(s): LIPASE, AMYLASE in the last 168 hours. No results for input(s): AMMONIA in the last 168 hours. Coagulation Profile: No results for input(s): INR, PROTIME in the last 168 hours. Cardiac Enzymes: No results for input(s): CKTOTAL, CKMB, CKMBINDEX, TROPONINI in the last 168 hours. BNP (last 3 results) No results for input(s): PROBNP in the last 8760 hours. HbA1C: No results for input(s): HGBA1C in the last 72 hours. CBG: Recent Labs  Lab 11/18/20 1155 11/18/20 1916 11/19/20 0003 11/19/20 0629 11/19/20 1212  GLUCAP 125* 175* 148* 110* 106*   Lipid Profile: No results for input(s): CHOL, HDL, LDLCALC,  TRIG, CHOLHDL, LDLDIRECT in the last 72 hours. Thyroid Function Tests: No results for input(s): TSH, T4TOTAL, FREET4, T3FREE, THYROIDAB in the last 72 hours. Anemia Panel: No results for input(s): VITAMINB12, FOLATE, FERRITIN, TIBC, IRON, RETICCTPCT in the last 72 hours. Sepsis Labs: No results for input(s): PROCALCITON, LATICACIDVEN in the last 168 hours.  Recent Results (from the past 240 hour(s))  SARS CORONAVIRUS 2 (TAT 6-24 HRS) Nasopharyngeal Nasopharyngeal Swab     Status: None   Collection Time: 11/10/20 11:43 PM   Specimen: Nasopharyngeal Swab  Result Value Ref Range Status   SARS Coronavirus 2 NEGATIVE NEGATIVE Final    Comment: (NOTE) SARS-CoV-2 target nucleic acids are NOT DETECTED.  The SARS-CoV-2 RNA is generally detectable in upper and lower respiratory specimens during the acute phase of infection. Negative results do not preclude SARS-CoV-2 infection, do not rule out co-infections with other pathogens, and should not be used as the sole basis for treatment or other patient management decisions. Negative results must be combined with clinical observations, patient history, and epidemiological information.  The expected result is Negative.  Fact Sheet for Patients: HairSlick.no  Fact Sheet for Healthcare Providers: quierodirigir.com  This test is not yet approved or cleared by the [place] FDA and  has been authorized for detection and/or diagnosis of SARS-CoV-2 by FDA under an Emergency Use Authorization (EUA). This EUA will remain  in effect (meaning this test can be used) for the duration of the COVID-19 declaration under Se ction 564(b)(1) of the Act, 21 U.S.C. section 360bbb-3(b)(1), unless the authorization is terminated or revoked sooner.  Performed at [facility], [address], [place], [place] [address]   SARS CORONAVIRUS 2 (TAT 6-24 HRS) Nasopharyngeal Nasopharyngeal Swab     Status:  None   Collection Time: 11/15/20 10:38 AM   Specimen: Nasopharyngeal Swab  Result Value Ref Range Status   SARS Coronavirus 2 NEGATIVE NEGATIVE Final    Comment: (NOTE) SARS-CoV-2 target nucleic acids are NOT DETECTED.  The SARS-CoV-2 RNA is generally detectable in upper and lower respiratory specimens during the acute phase of infection. Negative results do not preclude SARS-CoV-2 infection, do not rule out co-infections with other pathogens, and should not be used as the sole basis for treatment or other patient management decisions. Negative results must be combined with clinical observations, patient history, and epidemiological information. The expected result is Negative.  Fact Sheet for Patients: HairSlick.no  Fact Sheet for Healthcare Providers: quierodirigir.com  This test is not yet approved or cleared by the [place] FDA and  has been authorized for detection and/or diagnosis of SARS-CoV-2 by FDA under an Emergency Use Authorization (EUA). This EUA will remain  in effect (meaning this test can be used) for the duration of the COVID-19 declaration under Se ction 564(b)(1) of the Act, 21 U.S.C. section 360bbb-3(b)(1), unless the authorization is terminated or revoked sooner.  Performed at [facility], [address], [place], [place] [address]          Radiology Studies: No results found.      Scheduled Meds: . amLODipine  5 mg Oral Daily  . aspirin  300 mg Rectal Daily   Or  . aspirin  325 mg Oral Daily  . atorvastatin  40 mg Oral Daily  . clopidogrel  75 mg Oral Daily  . diclofenac Sodium  1 application Topical QID  . enoxaparin (LOVENOX) injection  40 mg Subcutaneous Q24H  . FLUoxetine  10 mg Oral Daily  . fluticasone  1 spray Each Nare Daily  . gabapentin  200 mg Oral BID  . multivitamin with minerals  1 tablet Oral Daily  . OLANZapine  5 mg Oral QHS  . pantoprazole  40 mg Oral  Daily   Continuous Infusions:   LOS: 8 days    Time spent: 30 minutes    Dorcas Carrow, MD [facility] Pager [phone]

## 2020-11-20 LAB — GLUCOSE, CAPILLARY
Glucose-Capillary: 100 mg/dL — ABNORMAL HIGH (ref 70–99)
Glucose-Capillary: 138 mg/dL — ABNORMAL HIGH (ref 70–99)
Glucose-Capillary: 176 mg/dL — ABNORMAL HIGH (ref 70–99)

## 2020-11-20 MED ORDER — SALINE SPRAY 0.65 % NA SOLN
1.0000 | NASAL | Status: DC | PRN
  Administered 2020-11-20 – 2020-11-28 (×3): 1 via NASAL
  Filled 2020-11-20: qty 44

## 2020-11-20 NOTE — Progress Notes (Signed)
PROGRESS NOTE    Cathy Hall  WUJ:[id number] DOB: Dec 30, 1970 DOA: 11/10/2020 PCP: Patient, No Pcp Per    Brief Narrative:  Patient is a 50 year old female with obesity, cocaine and marijuana use, COPD and hypertension who was recently admitted with new stroke, went home and came back with worsening dysarthria and difficulty managing secretions as well as worsening left-sided weakness. 1/1-1/5, admitted with new right thalamic and left midbrain stroke with left hemiparesis.  Was at home for a few hours and came back. In the emergency room CTA showed no large vessel occlusion.  MRI of the brain showed multiple new bilateral subcortical white matter infarcts.  Since then she is admitted to the hospital, waiting for placement at the skilled nursing facility.   Assessment & Plan:   Principal Problem:   Acute CVA (cerebrovascular accident) (HCC) Active Problems:   Cocaine use disorder, moderate, dependence (HCC)   Essential hypertension   ARF (acute renal failure) (HCC)   Obesity, Class III, BMI 40-49.9 (morbid obesity) (HCC)  Acute recurrent right thalamic stroke, multiple new white matter infarcts: Clinical findings, dysarthria and left hemiparesis.  Recurrent symptoms.  Clinically stabilizing. CT head findings, negative. MRI of the brain, as above.  Acute right thalamic infarct and multiple new white matter infarcts. CTA of the head and neck, no new large vessel occlusion or high-grade stenosis. 2D echocardiogram, normal ejection fraction.  No source of embolism. Antiplatelet therapy, on aspirin and Plavix at home, resumed. LDL, already on atorvastatin.  Continued. Hemoglobin A1c, 5.5.  No indication for treatment. DVT prophylaxis, Lovenox subcu. Therapy recommendations, PT and OT working with patient, recommended skilled nursing facility placement. Secondary risk factor modification including Cocaine and marijuana use, extensive counseling and cessation  counseling. Morbid obesity and suspected sleep apnea, I updated neurology that she is interested to enroll into sleep study. She may be out of the 2 weeks window for research study.  Mild intermittent asthma: Patient has both inspiratory and expiratory wheezing and upper airway sound.  Will use some Flonase.  Albuterol inhaler with some help.  No evidence of pneumonia. Patient obviously high risk of sleep apnea, will benefit with in-house testing.  If unable, will refer to outpatient testing.  Essential hypertension: Blood pressure stable on amlodipine.  Continue.  Depression: On Prozac and Zyprexa.  Complains of neuropathic pain.  Will increase dose of gabapentin today.   DVT prophylaxis: enoxaparin (LOVENOX) injection 40 mg Start: 11/11/20 1000   Code Status: Full code Family Communication: None  Disposition Plan: Status is: Inpatient  Remains inpatient appropriate because:Unsafe d/c plan and Inpatient level of care appropriate due to severity of illness   Dispo: The patient is from: Home              Anticipated d/c is to: SNF              Anticipated d/c date is: 1 day              Patient currently is medically stable to d/c.         Consultants:   Neurology  Procedures:   None  Antimicrobials:   None   Subjective: Patient seen and examined.  No overnight events.  She did feel better after increased dose of gabapentin.  Stuffy nose responded to Flonase.  Objective: Vitals:   11/19/20 1934 11/19/20 2338 11/20/20 0401 11/20/20 0739  BP: (!) 154/95 120/68 (!) 144/88 (!) 103/56  Pulse: 90 87 85 67  Resp: 19 19 19  16  Temp: 97.9 F (36.6 C) 98.3 F (36.8 C) 98.5 F (36.9 C) 98 F (36.7 C)  TempSrc: Oral Oral Oral Oral  SpO2: 98% 98% 98% 99%  Height:       No intake or output data in the 24 hours ending 11/20/20 1155 There were no vitals filed for this visit.  Examination:  General exam: Appears calm and comfortable , comfortably eating  lunch. Respiratory system: Clear to auscultation. Respiratory effort normal.  No added sounds. Cardiovascular system: S1 & S2 heard, RRR. No JVD, murmurs, rubs, gallops or clicks. No pedal edema. Gastrointestinal system: Abdomen is nondistended, soft and nontender. No organomegaly or masses felt. Normal bowel sounds heard. Central nervous system: Alert and oriented.  Patient has mild left facial droop. Left upper and lower extremity 4/5, right is normal. Psychiatry: Judgement and insight appear normal. Mood & affect appropriate.     Data Reviewed: I have personally reviewed following labs and imaging studies  CBC: Recent Labs  Lab 11/15/20 0451  WBC 9.1  HGB 14.0  HCT 44.0  MCV 94.2  PLT 257   Basic Metabolic Panel: Recent Labs  Lab 11/15/20 0451 11/18/20 0433  NA 138  --   K 4.3  --   CL 101  --   CO2 26  --   GLUCOSE 122*  --   BUN 14  --   CREATININE 1.01* 0.77  CALCIUM 9.5  --    GFR: CrCl cannot be calculated (Unknown ideal weight.). Liver Function Tests: No results for input(s): AST, ALT, ALKPHOS, BILITOT, PROT, ALBUMIN in the last 168 hours. No results for input(s): LIPASE, AMYLASE in the last 168 hours. No results for input(s): AMMONIA in the last 168 hours. Coagulation Profile: No results for input(s): INR, PROTIME in the last 168 hours. Cardiac Enzymes: No results for input(s): CKTOTAL, CKMB, CKMBINDEX, TROPONINI in the last 168 hours. BNP (last 3 results) No results for input(s): PROBNP in the last 8760 hours. HbA1C: No results for input(s): HGBA1C in the last 72 hours. CBG: Recent Labs  Lab 11/19/20 1212 11/19/20 1629 11/19/20 2336 11/20/20 0612 11/20/20 1150  GLUCAP 106* 139* 138* 100* 138*   Lipid Profile: No results for input(s): CHOL, HDL, LDLCALC, TRIG, CHOLHDL, LDLDIRECT in the last 72 hours. Thyroid Function Tests: No results for input(s): TSH, T4TOTAL, FREET4, T3FREE, THYROIDAB in the last 72 hours. Anemia Panel: No results for  input(s): VITAMINB12, FOLATE, FERRITIN, TIBC, IRON, RETICCTPCT in the last 72 hours. Sepsis Labs: No results for input(s): PROCALCITON, LATICACIDVEN in the last 168 hours.  Recent Results (from the past 240 hour(s))  SARS CORONAVIRUS 2 (TAT 6-24 HRS) Nasopharyngeal Nasopharyngeal Swab     Status: None   Collection Time: 11/10/20 11:43 PM   Specimen: Nasopharyngeal Swab  Result Value Ref Range Status   SARS Coronavirus 2 NEGATIVE NEGATIVE Final    Comment: (NOTE) SARS-CoV-2 target nucleic acids are NOT DETECTED.  The SARS-CoV-2 RNA is generally detectable in upper and lower respiratory specimens during the acute phase of infection. Negative results do not preclude SARS-CoV-2 infection, do not rule out co-infections with other pathogens, and should not be used as the sole basis for treatment or other patient management decisions. Negative results must be combined with clinical observations, patient history, and epidemiological information. The expected result is Negative.  Fact Sheet for Patients: HairSlick.no  Fact Sheet for Healthcare Providers: quierodirigir.com  This test is not yet approved or cleared by the [place] FDA and  has been authorized for  detection and/or diagnosis of SARS-CoV-2 by FDA under an Emergency Use Authorization (EUA). This EUA will remain  in effect (meaning this test can be used) for the duration of the COVID-19 declaration under Se ction 564(b)(1) of the Act, 21 U.S.C. section 360bbb-3(b)(1), unless the authorization is terminated or revoked sooner.  Performed at [facility], [address], [place], [place] [address]   SARS CORONAVIRUS 2 (TAT 6-24 HRS) Nasopharyngeal Nasopharyngeal Swab     Status: None   Collection Time: 11/15/20 10:38 AM   Specimen: Nasopharyngeal Swab  Result Value Ref Range Status   SARS Coronavirus 2 NEGATIVE NEGATIVE Final    Comment: (NOTE) SARS-CoV-2  target nucleic acids are NOT DETECTED.  The SARS-CoV-2 RNA is generally detectable in upper and lower respiratory specimens during the acute phase of infection. Negative results do not preclude SARS-CoV-2 infection, do not rule out co-infections with other pathogens, and should not be used as the sole basis for treatment or other patient management decisions. Negative results must be combined with clinical observations, patient history, and epidemiological information. The expected result is Negative.  Fact Sheet for Patients: HairSlick.no  Fact Sheet for Healthcare Providers: quierodirigir.com  This test is not yet approved or cleared by the [place] FDA and  has been authorized for detection and/or diagnosis of SARS-CoV-2 by FDA under an Emergency Use Authorization (EUA). This EUA will remain  in effect (meaning this test can be used) for the duration of the COVID-19 declaration under Se ction 564(b)(1) of the Act, 21 U.S.C. section 360bbb-3(b)(1), unless the authorization is terminated or revoked sooner.  Performed at [facility], [address] [place], [place] [address]          Radiology Studies: No results found.      Scheduled Meds: . amLODipine  5 mg Oral Daily  . aspirin  300 mg Rectal Daily   Or  . aspirin  325 mg Oral Daily  . atorvastatin  40 mg Oral Daily  . clopidogrel  75 mg Oral Daily  . diclofenac Sodium  1 application Topical QID  . enoxaparin (LOVENOX) injection  40 mg Subcutaneous Q24H  . FLUoxetine  10 mg Oral Daily  . fluticasone  1 spray Each Nare Daily  . gabapentin  200 mg Oral BID  . multivitamin with minerals  1 tablet Oral Daily  . OLANZapine  5 mg Oral QHS  . pantoprazole  40 mg Oral Daily   Continuous Infusions:   LOS: 9 days    Time spent: 25 minutes    Dorcas Carrow, MD [facility] Pager [phone]

## 2020-11-21 LAB — GLUCOSE, CAPILLARY
Glucose-Capillary: 160 mg/dL — ABNORMAL HIGH (ref 70–99)
Glucose-Capillary: 166 mg/dL — ABNORMAL HIGH (ref 70–99)
Glucose-Capillary: 261 mg/dL — ABNORMAL HIGH (ref 70–99)

## 2020-11-21 MED ORDER — MONTELUKAST SODIUM 10 MG PO TABS
10.0000 mg | ORAL_TABLET | Freq: Every day | ORAL | Status: DC
  Administered 2020-11-21 – 2020-11-29 (×9): 10 mg via ORAL
  Filled 2020-11-21 (×9): qty 1

## 2020-11-21 MED ORDER — MOMETASONE FURO-FORMOTEROL FUM 100-5 MCG/ACT IN AERO
2.0000 | INHALATION_SPRAY | Freq: Two times a day (BID) | RESPIRATORY_TRACT | Status: DC
  Administered 2020-11-21 – 2020-11-30 (×17): 2 via RESPIRATORY_TRACT
  Filled 2020-11-21: qty 8.8

## 2020-11-21 NOTE — Plan of Care (Signed)
  Problem: Safety: Goal: Ability to remain free from injury will improve Outcome: Progressing   Problem: Ischemic Stroke/TIA Tissue Perfusion: Goal: Complications of ischemic stroke/TIA will be minimized Outcome: Progressing   Problem: Skin Integrity: Goal: Risk for impaired skin integrity will decrease Outcome: Progressing

## 2020-11-21 NOTE — Progress Notes (Signed)
PT Cancellation Note  Patient Details Name: Elta Angell MRN: [id number] DOB: 10-15-1971   Cancelled Treatment:    Reason Eval/Treat Not Completed: Medical issues which prohibited therapy  Patient with nausea and very sleepy due to nausea meds. Will attemptt to see in pm as schedule permits and pt nausea/grogginess improves.   Jerolyn Center, PT Pager [phone]   Zena Amos 11/21/2020, 11:57 AM

## 2020-11-21 NOTE — Progress Notes (Addendum)
  Speech Language Pathology Treatment: Dysphagia  Patient Details Name: Cathy Hall MRN: [id number] DOB: 03-07-1971 Today's Date: 11/21/2020 Time: 6803-2122 SLP Time Calculation (min) (ACUTE ONLY): 8 min  Assessment / Plan / Recommendation Clinical Impression  Pt consumed regular texture and thin liquids without s/s aspiration. Agreeable to sitting fully upright with trials and recalled strategies. Intake during observation was limited- she states she does not pocket like before. Suspect she has improved with her follow through of strategies but likely has times where she may not be as compliant. Upgraded texture to regular, continue thin. She has met her dysphagia goals. Dysarthria appears to have improved from eval- will follow up for current cognitive status.    HPI HPI: Cathy Hall is a 50 y.o. female with history of COPD cocaine abuse hypertension just discharged today earlier after being admitted for stroke with some left-sided weakness while sitting at home experienced worsening dysarthria and also difficulty managing her secretions.   CT shows A known small subacute infarct within the posterior left midbrain from prior admission on 1/3, but there is concern for an new insult as well. Repeat MRI brain was attempted but due to worsening secretion and was unable to complete it.      SLP Plan  All goals met;Discharge SLP treatment due to (comment)       Recommendations  Diet recommendations: Regular;Thin liquid Liquids provided via: Cup;Straw Medication Administration: Whole meds with puree Supervision: Patient able to self feed Compensations: Slow rate;Small sips/bites;Minimize environmental distractions Postural Changes and/or Swallow Maneuvers: Seated upright 90 degrees                Oral Care Recommendations: Oral care BID Follow up Recommendations: Skilled Nursing facility SLP Visit Diagnosis: Dysphagia, oropharyngeal phase (R13.12) Plan:  All goals met;Discharge SLP treatment due to (comment)       GO                Houston Siren 11/21/2020, 2:05 PM

## 2020-11-21 NOTE — Progress Notes (Signed)
[facility] PROGRESS NOTE  Cathy Hall [id number]:[id number] DOB: Jul 25, 1971 DOA: 11/10/2020 PCP: Patient, No Pcp Per  Status: Remains inpatient appropriate because:Unsafe d/c plan   Dispo: The patient is from: Home              Anticipated d/c is to: SNF              Anticipated d/c date is: > 3 days              Patient currently is medically stable to d/c. Barriers to discharge. SNF recommended for short-term rehab but patient does not have a funding source. Medicaid application pending.  Plan is to contact [facility] in [place] since they do accept LOGs  Code Status: Full Family Communication: Patient DVT prophylaxis: Lovenox Vaccination status: Received first dose of Moderna vaccine on 1/11  Foley catheter: No  HPI:  50 y.o. F with obesity, cocaine use, COPD, and HTN recently admitted for new stroke, who returned with symptoms of dysarthria, difficulty managing secretions and worsening left sided weakness.  Was admitted from 1/1 to 1/5 for new R thalamic and L midbrain stroke with resulting left sided weakness.  On the day of discharge, patient was home for a few hours when she returned to the ER.  In the ER, CTA showed no LVO.  MRI brain showed multiple new bilateral subcortical white matter infarcts.     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 Also had concerns about possible OSA diagnosis.  Discussed with patient that too soon to tell whether she would qualify for permanent disability which she should certainly explore this with your PCP after discharge.  Also informed patient that polysomnogram study for sleep apnea is an outpatient work-up.  Objective: Vitals:   11/21/20 0017 11/21/20 0400  BP: 140/83 138/70  Pulse: 92 85  Resp: 19 18  Temp: 98.2 F (36.8 C) 98.7 F (37.1 C)  SpO2: 97% 98%    Intake/Output Summary (Last 24 hours) at 11/21/2020 0806 Last data  filed at 11/20/2020 1303 Gross per 24 hour  Intake 236 ml  Output -  Net 236 ml   There were no vitals filed for this visit.  Exam:  Constitutional: Awakened, no acute distress Respiratory: Lungs are clear to auscultation, respiratory effort unlabored, stable on room air Cardiovascular: Normal heart sounds, regular pulse, warm extremities Abdomen:  LBM 1/15 Neurologic: CN 2-12 grossly intact. Sensation intact, DTR normal. Strength 5/5 on the right with UEs strength 4/5 in LLE strength 3/5 Psychiatric: Normal judgment and insight. Alert and oriented x 3. Normal mood.    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   -CTA head and neck was repeated on readmission with no new large vessel occlusion or high grade stenosis.   -Echo not repeated given had just been completed. -Continue aspirin and Plavix -Continue atorvastatin -SLP had recommended adjusted diet based on mild dysphagia but patient has not been adhering and refusing to eat appropriate diet therefore diet advanced slowly by SLP -Dissipating better with PT and await SNF bed offer for short-term rehab  COPD w/Asthma/suspected OSA -Not hypoxic -Negative for COVID on 1/11 therefore isolation discontinued -Continue albuterol HFA to every 4 hours as needed -Was not on LABA prior to admission.  We will start Dulera 2 puffs twice  daily.  Need to determine co-pay in the event the cheaper alternative needs to be arranged -Begin Singulair 10 mg HS -1/12 chest x-ray unremarkable -Patient would benefit from outpatient PSG to clarify diagnosis  Hypertension -Continue amlodipine -Echocardiogram last admission with preserved LV function and no evidence of diastolic dysfunction  Depression -Continue Prozac, Zyprexa -Last year when patient's son  was murdered and her twin sister died less than 12 months ago  Neuropathy -Continue gabapentin  Other problems: GERD Obesity BMI >30 -Continue pantoprazole   Data Reviewed: Basic Metabolic Panel: Recent Labs  Lab 11/15/20 0451 11/18/20 0433  NA 138  --   K 4.3  --   CL 101  --   CO2 26  --   GLUCOSE 122*  --   BUN 14  --   CREATININE 1.01* 0.77  CALCIUM 9.5  --    Liver Function Tests: No results for input(s): AST, ALT, ALKPHOS, BILITOT, PROT, ALBUMIN in the last 168 hours. No results for input(s): LIPASE, AMYLASE in the last 168 hours. No results for input(s): AMMONIA in the last 168 hours. CBC: Recent Labs  Lab 11/15/20 0451  WBC 9.1  HGB 14.0  HCT 44.0  MCV 94.2  PLT 257   Cardiac Enzymes: No results for input(s): CKTOTAL, CKMB, CKMBINDEX, TROPONINI in the last 168 hours. BNP (last 3 results) No results for input(s): BNP in the last 8760 hours.  ProBNP (last 3 results) No results for input(s): PROBNP in the last 8760 hours.  CBG: Recent Labs  Lab 11/19/20 2336 11/20/20 0612 11/20/20 1150 11/20/20 1656 11/21/20 0015  GLUCAP 138* 100* 138* 176* 166*    Recent Results (from the past 240 hour(s))  SARS CORONAVIRUS 2 (TAT 6-24 HRS) Nasopharyngeal Nasopharyngeal Swab     Status: None   Collection Time: 11/15/20 10:38 AM   Specimen: Nasopharyngeal Swab  Result Value Ref Range Status   SARS Coronavirus 2 NEGATIVE NEGATIVE Final    Comment: (NOTE) SARS-CoV-2 target nucleic acids are NOT DETECTED.  The SARS-CoV-2 RNA is generally detectable in upper and lower respiratory specimens during the acute phase of infection. Negative results do not preclude SARS-CoV-2 infection, do not rule out co-infections with other pathogens, and should not be used as the sole basis for treatment or other patient management decisions. Negative results must be combined with clinical observations, patient history, and epidemiological information. The expected result is  Negative.  Fact Sheet for Patients: HairSlick.no  Fact Sheet for Healthcare Providers: quierodirigir.com  This test is not yet approved or cleared by the [place] FDA and  has been authorized for detection and/or diagnosis of SARS-CoV-2 by FDA under an Emergency Use Authorization (EUA). This EUA will remain  in effect (meaning this test can be used) for the duration of the COVID-19 declaration under Se ction 564(b)(1) of the Act, 21 U.S.C. section 360bbb-3(b)(1), unless the authorization is terminated or revoked sooner.  Performed at [facility], [address] [place], [place] [address]      Studies: No results found.  Scheduled Meds: . amLODipine  5 mg Oral Daily  . aspirin  300 mg Rectal Daily   Or  . aspirin  325 mg Oral Daily  . atorvastatin  40 mg Oral Daily  . clopidogrel  75 mg Oral Daily  . diclofenac Sodium  1 application Topical QID  . enoxaparin (LOVENOX) injection  40 mg Subcutaneous Q24H  . FLUoxetine  10 mg Oral Daily  . fluticasone  1 spray Each  Nare Daily  . gabapentin  200 mg Oral BID  . multivitamin with minerals  1 tablet Oral Daily  . OLANZapine  5 mg Oral QHS  . pantoprazole  40 mg Oral Daily   Continuous Infusions:  Principal Problem:   Acute CVA (cerebrovascular accident) (HCC) Active Problems:   Cocaine use disorder, moderate, dependence (HCC)   Essential hypertension   ARF (acute renal failure) (HCC)   Obesity, Class III, BMI 40-49.9 (morbid obesity) (HCC)   Consultants:  Neurology  Procedures:  None  Antibiotics: Anti-infectives (From admission, onward)   None       Time spent: 20 minutes    Junious Silk ANP  [facility] 7 am - 330 pm/M-F for direct patient care and secure chat Please refer to Amion for contact info 10  days

## 2020-11-21 NOTE — TOC Progression Note (Addendum)
Transition of Care [facility]) - Progression Note    Patient Details  Name: Cathy Hall MRN: [id number] Date of Birth: Nov 24, 1970  [facility]) CM/SW Contact  Janae Bridgeman, RN Phone Number: 11/21/2020, 2:16 PM  Clinical Narrative:    Case management spoke with Selena Batten, CM at [facility] and asked if the facility was willing to view the patient's clinicals for possible admission to the facility for 2 weeks of rehabilitation covered under a LOG offered by [facility] hospital.  Selena Batten, CM agreed to look at the patient's clinicals placed in the hub - I sent updated PT/OT therapy notes and resent initial clinical information in the hub.  [facility] only has one open bed at the facility, but the case manager states that she will assess the patient before offering/declining for admission.  Updated therapy notes placed in the hub for all local [facility] and [facility] SN[facility] facilities with internal comment of "willing to offer LOG for admission" to assist in bed offers.  CM will continue to follow the patient for [facility] admission opportunity.  11/21/2020 - CM spoke with Selena Batten, CM with [facility] and she asked where the patient was in the process of her Medicaid application and answered questions regarding patient's clinical history and care needs.  Selena Batten, CM is continuing to view the patient's needs in regards to her care and past medical history in regards to her previous substance abuse.  I will speak with Edwin Dada, CSW and have her return Kim's call regarding the patient's Medicaid application Selena Batten, CM can be reached at [name] at [phone].   Expected Discharge Plan: Skilled Nursing Facility Barriers to Discharge: No SNF bed,Continued Medical Work up,Inadequate or no insurance  Expected Discharge Plan and Services Expected Discharge Plan: Skilled Nursing Facility In-house Referral: Clinical Social Work,Financial [employer] / Futures trader Discharge Planning Services: CM Consult Post Acute Care Choice: Skilled Nursing Facility Living arrangements for the past 2 months: Single Family Home Expected Discharge Date: 11/16/20                                     Social Determinants of Health (SDOH) Interventions    Readmission Risk Interventions Readmission Risk Prevention Plan 11/16/2020  Transportation Screening Complete  PCP or Specialist Appt within 3-5 Days Complete  HRI or Home Care Consult Complete  Social Work Consult for Recovery Care Planning/Counseling Complete  Palliative Care Screening Complete  Medication Review Oceanographer) Complete

## 2020-11-21 NOTE — Progress Notes (Signed)
Physical Therapy Treatment Patient Details Name: Cathy Hall MRN: [id number] DOB: [birth year] Today's Date: 11/21/2020    History of Present Illness Pt is a 50 y/o female admitted secondary to worsening L sided weakness, difficulty managing secretions and dysarthria. Thought to be secondary to extension of recent CVA. Recently discharged secondary to CVA. PMH inclues CVA, COPD, drug abuse, PTSD.    PT Comments    Patient ambulating independently to nurses station without device with decr awareness of incr fall risk. Educated on need for RW due to risk of knee buckling and proper use of RW. Educated on HEP for knee strengthening and handout provided.     Follow Up Recommendations  SNF;Supervision/Assistance - 24 hour     Equipment Recommendations  Rolling walker with 5" wheels    Recommendations for Other Services       Precautions / Restrictions Precautions Precautions: Fall Precaution Comments: mildly impulsive    Mobility  Bed Mobility               General bed mobility comments: met pt up at nurses station  Transfers Overall transfer level: Needs assistance Equipment used: Rolling walker (2 wheeled);None Transfers: Sit to/from Stand Sit to Stand: Modified independent (Device/Increase time)         General transfer comment: good sequencing with RW; also worked on transfer with no UE assist  Ambulation/Gait Ambulation/Gait assistance: Min guard Gait Distance (Feet): 100 Feet Assistive device: Rolling walker (2 wheeled);None Gait Pattern/deviations: Step-through pattern;Decreased stride length;Shuffle Gait velocity: decreased   General Gait Details: pt up at RN station without DME; shuffling gait; reports she has had falls related to rt knee buckling and ultimately agreed to use RW to reduce risk of falling (after demonstration by PT of how RW can assist via UE support)   Stairs             Wheelchair Mobility    Modified Rankin  (Stroke Patients Only) Modified Rankin (Stroke Patients Only) Pre-Morbid Rankin Score: Slight disability Modified Rankin: Moderately severe disability     Balance Overall balance assessment: Needs assistance Sitting-balance support: Feet supported;No upper extremity supported Sitting balance-Leahy Scale: Fair     Standing balance support: No upper extremity supported;During functional activity Standing balance-Leahy Scale: Fair Standing balance comment: able to maintain static stance safely without support.                            Cognition Arousal/Alertness: Awake/alert Behavior During Therapy: WFL for tasks assessed/performed Overall Cognitive Status: Impaired/Different from baseline Area of Impairment: Problem solving;Attention;Safety/judgement;Awareness                   Current Attention Level: Sustained     Safety/Judgement: Decreased awareness of deficits;Decreased awareness of safety Awareness: Anticipatory Problem Solving: Slow processing;Requires verbal cues;Requires tactile cues General Comments: decreased insight into safety related to deficits      Exercises General Exercises - Lower Extremity Long Arc Quad: Strengthening;Both;10 reps;Seated Heel Slides: Both;10 reps;Seated (knee flexion isometrics ~2-3 sec each to tolerance) Hip Flexion/Marching: AROM;Right;10 reps;Seated Other Exercises Other Exercises: sit-stand from chair x5 without UE to power up or to lower Other Exercises: standing knee flexion x 5 reps each leg; cues for not hyperextending stance leg    General Comments        Pertinent Vitals/Pain Pain Assessment: No/denies pain    Home Living  Prior Function            PT Goals (current goals can now be found in the care plan section) Acute Rehab PT Goals Patient Stated Goal: "do more work" on the R knee Time For Goal Achievement: 11/25/20 Potential to Achieve Goals: Fair Progress  towards PT goals: Progressing toward goals    Frequency    Min 3X/week      PT Plan Current plan remains appropriate    Co-evaluation              AM-PAC PT "6 Clicks" Mobility   Outcome Measure  Help needed turning from your back to your side while in a flat bed without using bedrails?: None Help needed moving from lying on your back to sitting on the side of a flat bed without using bedrails?: None Help needed moving to and from a bed to a chair (including a wheelchair)?: A Little Help needed standing up from a chair using your arms (e.g., wheelchair or bedside chair)?: A Little Help needed to walk in hospital room?: A Little Help needed climbing 3-5 steps with a railing? : Total 6 Click Score: 18    End of Session Equipment Utilized During Treatment: Gait belt Activity Tolerance: Patient tolerated treatment well Patient left: with call bell/phone within reach;in bed Nurse Communication: Mobility status PT Visit Diagnosis: Unsteadiness on feet (R26.81);Muscle weakness (generalized) (M62.81);Other abnormalities of gait and mobility (R26.89)     Time: 6010-9323 PT Time Calculation (min) (ACUTE ONLY): 16 min  Charges:  $Therapeutic Exercise: 8-22 mins                      Arby Barrette, PT Pager [phone]    Rexanne Mano 11/21/2020, 4:40 PM

## 2020-11-22 ENCOUNTER — Inpatient Hospital Stay (HOSPITAL_COMMUNITY): Payer: Self-pay

## 2020-11-22 LAB — GLUCOSE, CAPILLARY
Glucose-Capillary: 104 mg/dL — ABNORMAL HIGH (ref 70–99)
Glucose-Capillary: 124 mg/dL — ABNORMAL HIGH (ref 70–99)
Glucose-Capillary: 185 mg/dL — ABNORMAL HIGH (ref 70–99)

## 2020-11-22 IMAGING — CT CT KNEE*R* W/O CM
3 series · 13 of 33 positions shown, 16 images · non-contrast
Comparison: Plain films right knee [DATE] and [DATE].

CLINICAL DATA: Chronic right knee pain.

EXAM:
CT OF THE RIGHT KNEE WITHOUT CONTRAST
TECHNIQUE: Multidetector CT imaging of the right knee was performed according
to the standard protocol. Multiplanar CT image reconstructions were
also generated.

[Series 4: extremity soft tissue · axial · 0.36mm/px · z∈[+66,+194]mm · 5 of 94 slices shown, 7 images]
[im 15/94  soft-tissue]
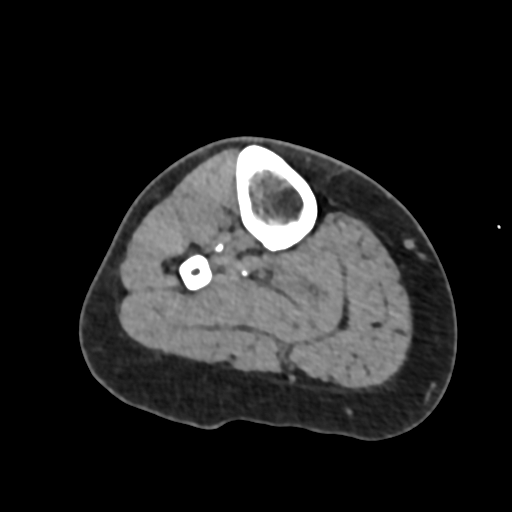
[im 15/94  bone]
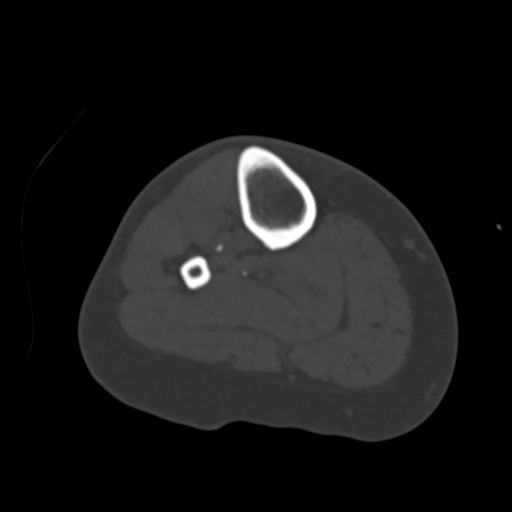
[im 29/94  bone]
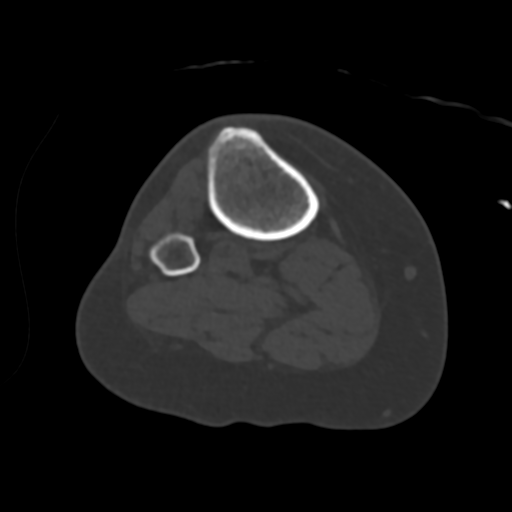
[im 51/94  bone]
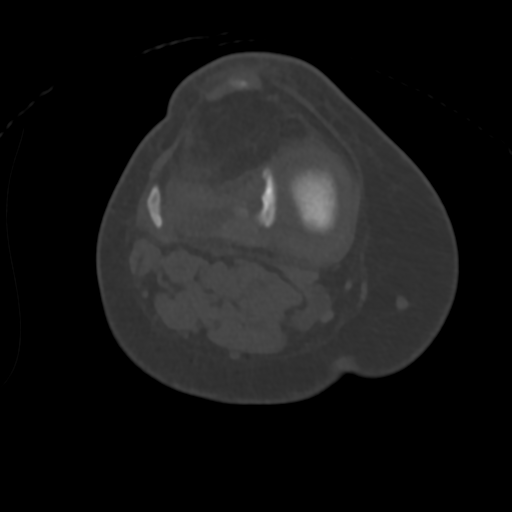
[im 65/94  bone]
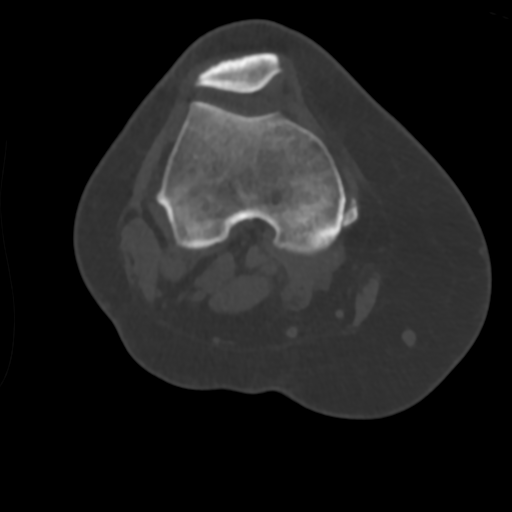
[im 79/94  soft-tissue]
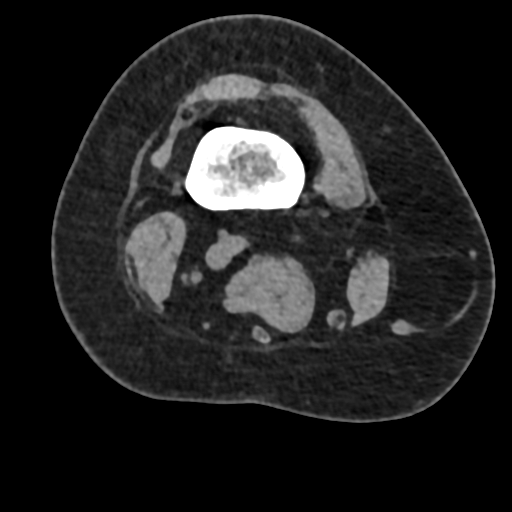
[im 79/94  bone]
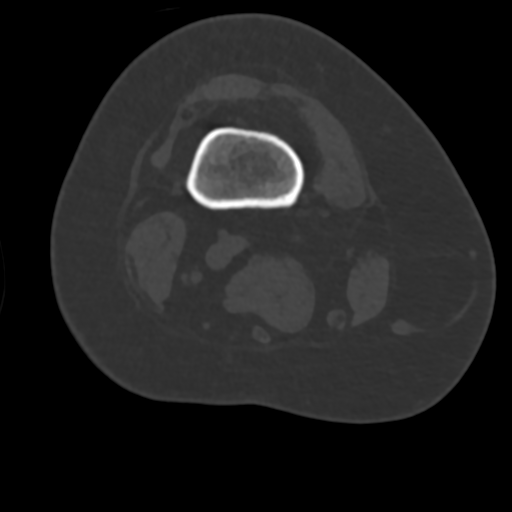

[Series 8: cor soft tissue · coronal · 0.33mm/px · 3 of 87 slices shown]
[im 18/87  bone]
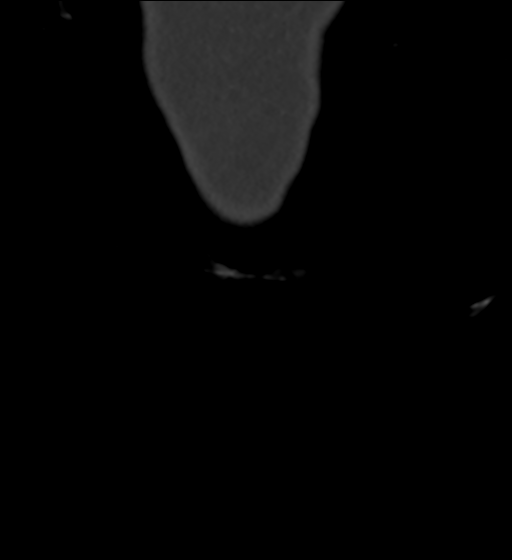
[im 35/87  bone]
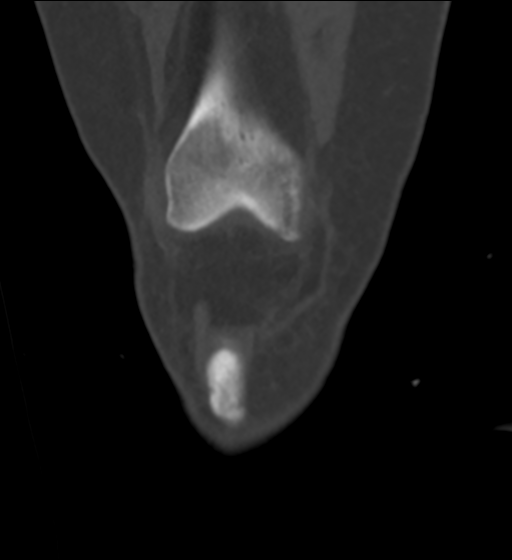
[im 52/87  bone]
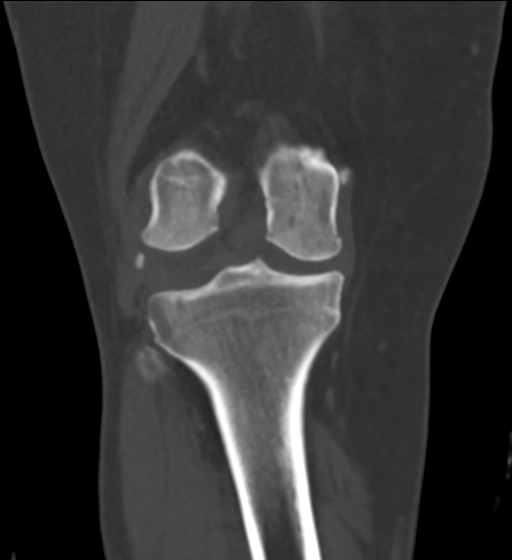

[Series 9: sag soft tissue · sagittal · 0.26mm/px · 5 of 95 slices shown, 6 images]
[im 32/95  bone]
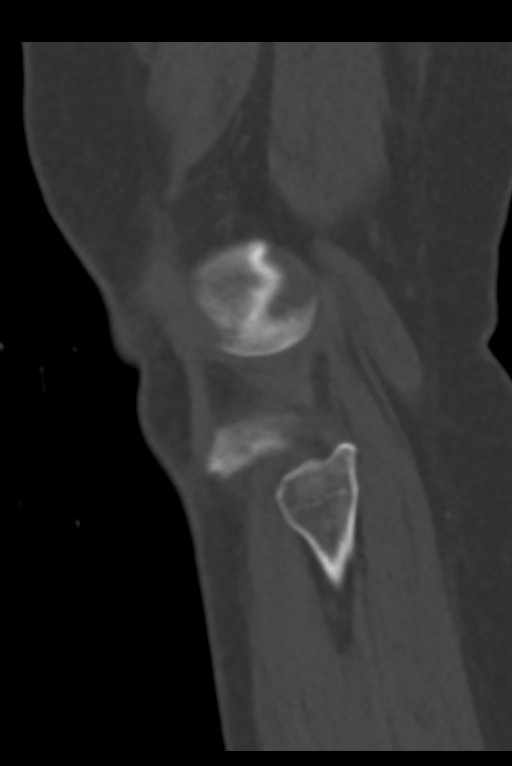
[im 40/95  bone]
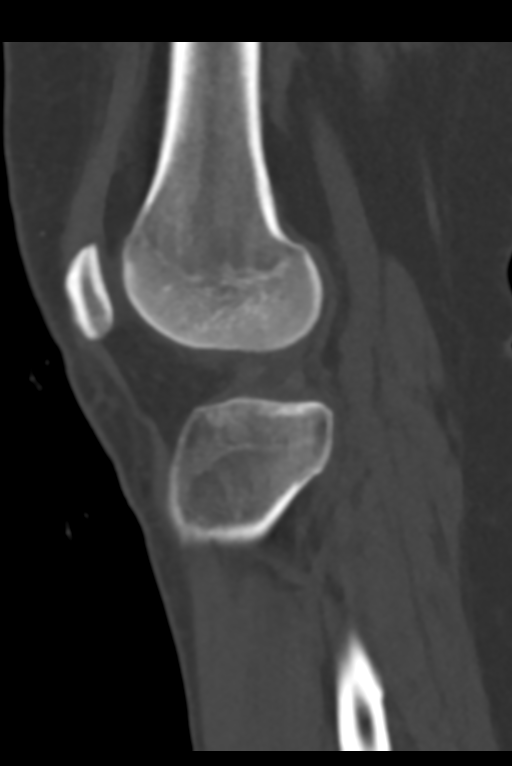
[im 48/95  soft-tissue]
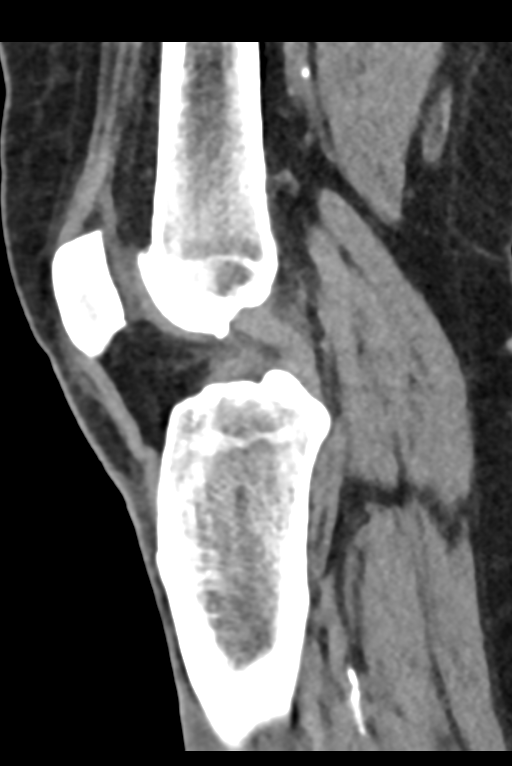
[im 48/95  bone]
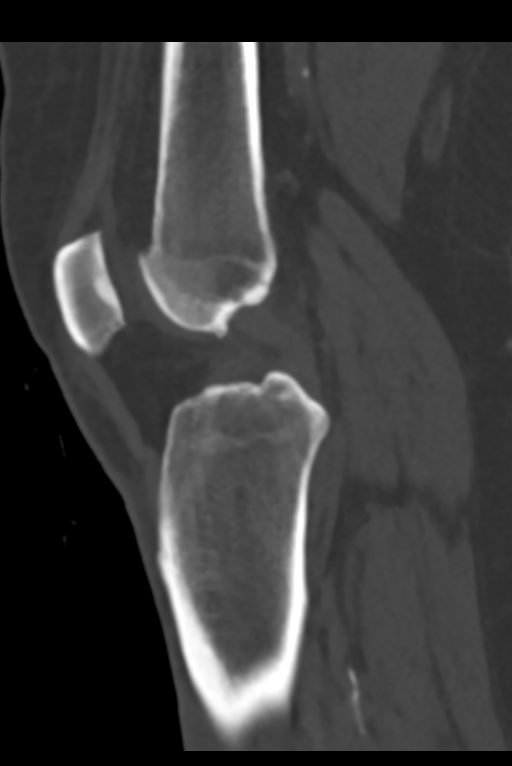
[im 55/95  bone]
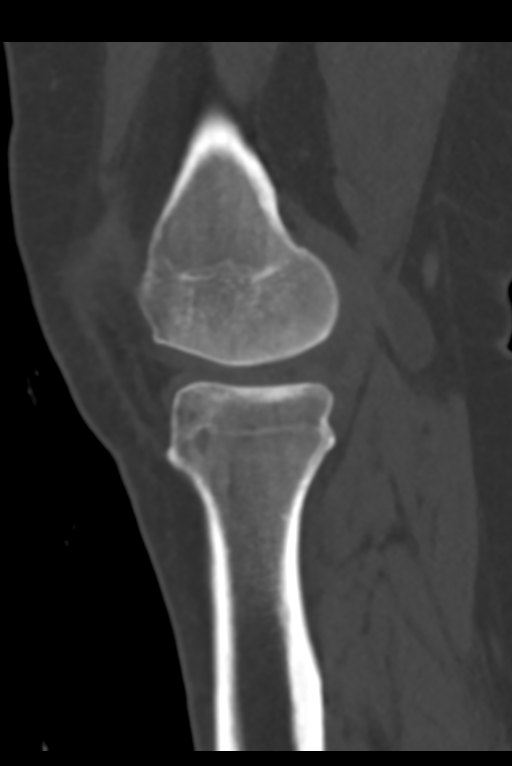
[im 63/95  bone]
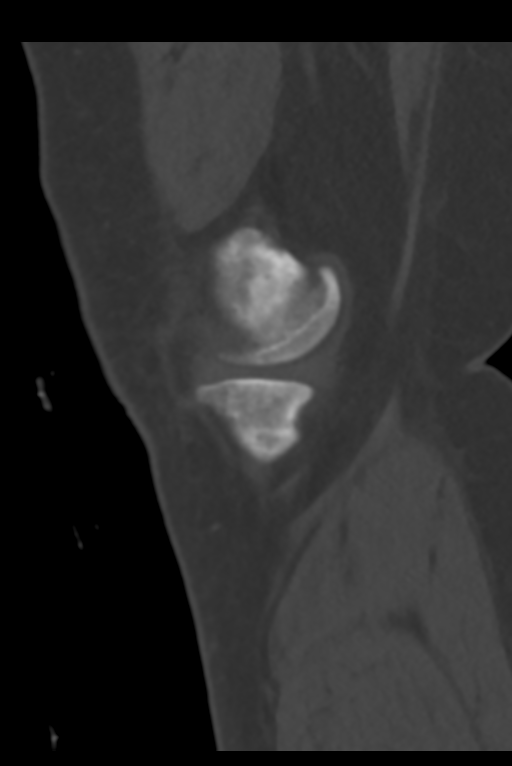

[13 of 33 positions shown; findings below may reference images not displayed]

FINDINGS: Bones/Joint/Cartilage

There is no acute bony or joint abnormality. No joint effusion. No
osteophytosis, chondrocalcinosis or erosion.

Ligaments

Suboptimally assessed by CT. Small ossification in the superior
fibers of the medial collateral ligament is consistent with remote
medial collateral ligament injury. Although the lateral compartment
appears abnormally widened, the lateral collateral ligament complex
appears intact on CT. The lateral meniscus appears torn. The medial
meniscus, ACL and PCL appear intact.

Muscles and Tendons

Intact and normal in appearance.

Soft tissues

A Baker's cyst measures 2 cm transverse x 1 cm AP x 3 cm
craniocaudal.
IMPRESSION: Negative for acute bony abnormality.

The lateral meniscus appears torn. Although the lateral compartment
is abnormally widened in appearance, the lateral collateral ligament
complex appears intact on CT. These findings could be better
evaluated with a noncontrast MRI.

Findings consistent with remote medial collateral ligament sprain.

Small Baker's cyst.

## 2020-11-22 IMAGING — CR DG KNEE 3 VIEWS*R*
3 series · 3 of 3 positions shown · non-contrast
Comparison: [DATE].

CLINICAL DATA: Right knee ACL tear.  MRI clearance.

EXAM:
RIGHT KNEE - 3 VIEW

[knee ap]
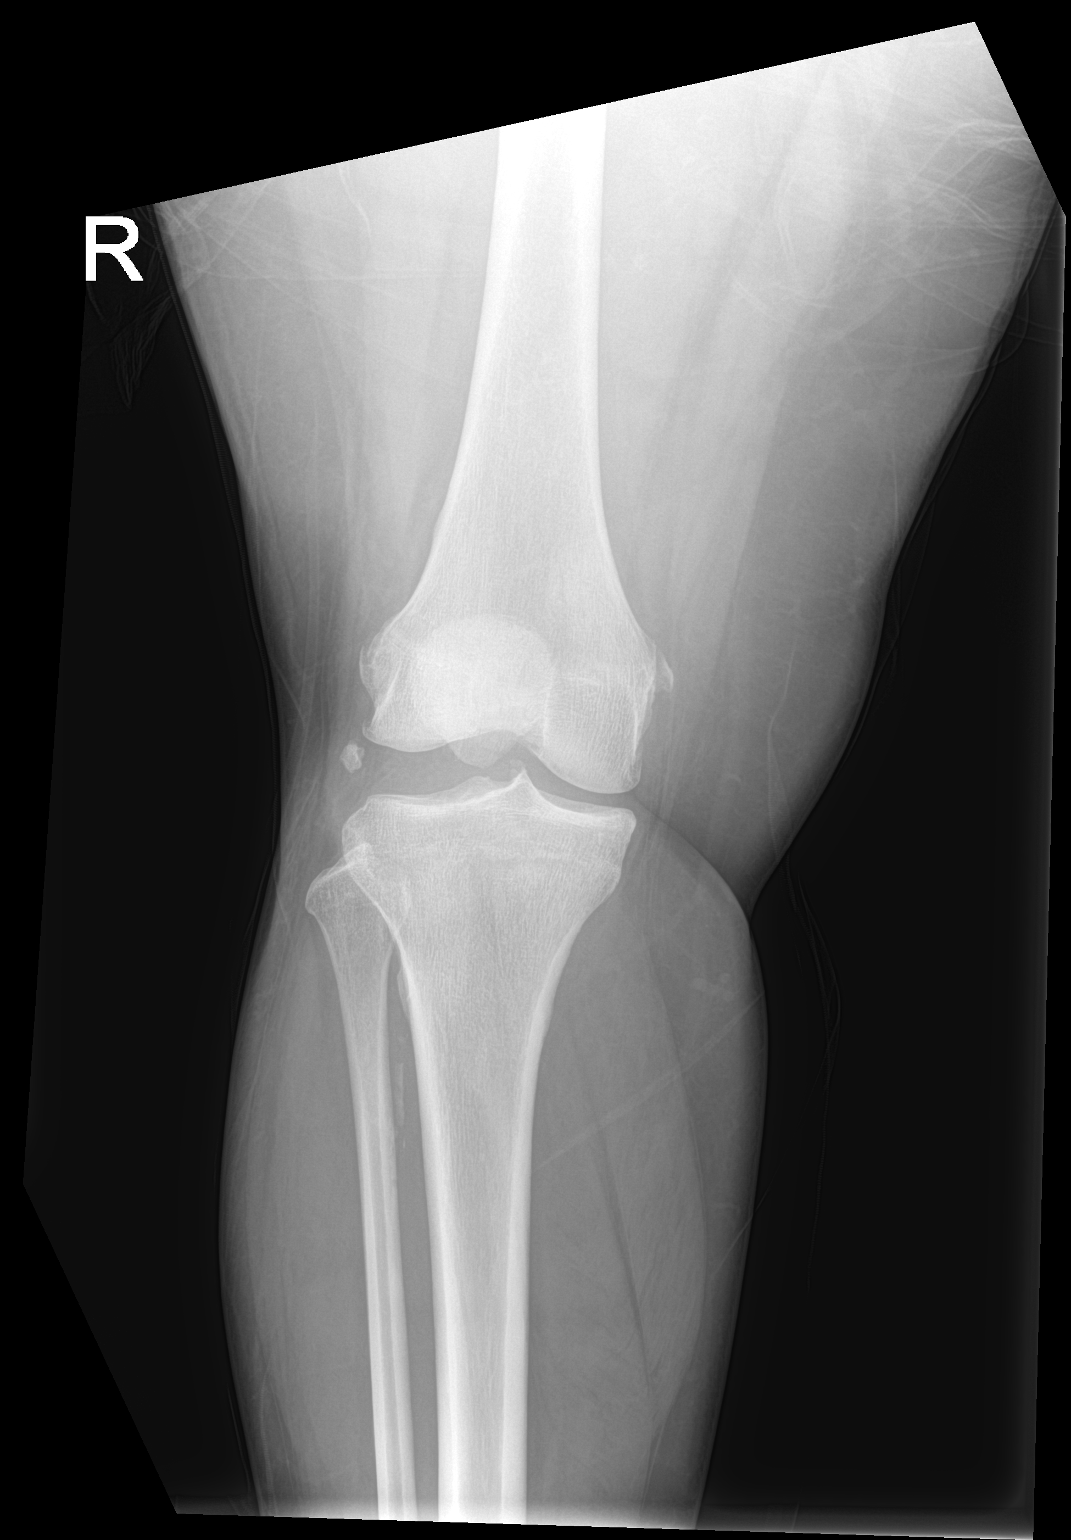

[tunnel]
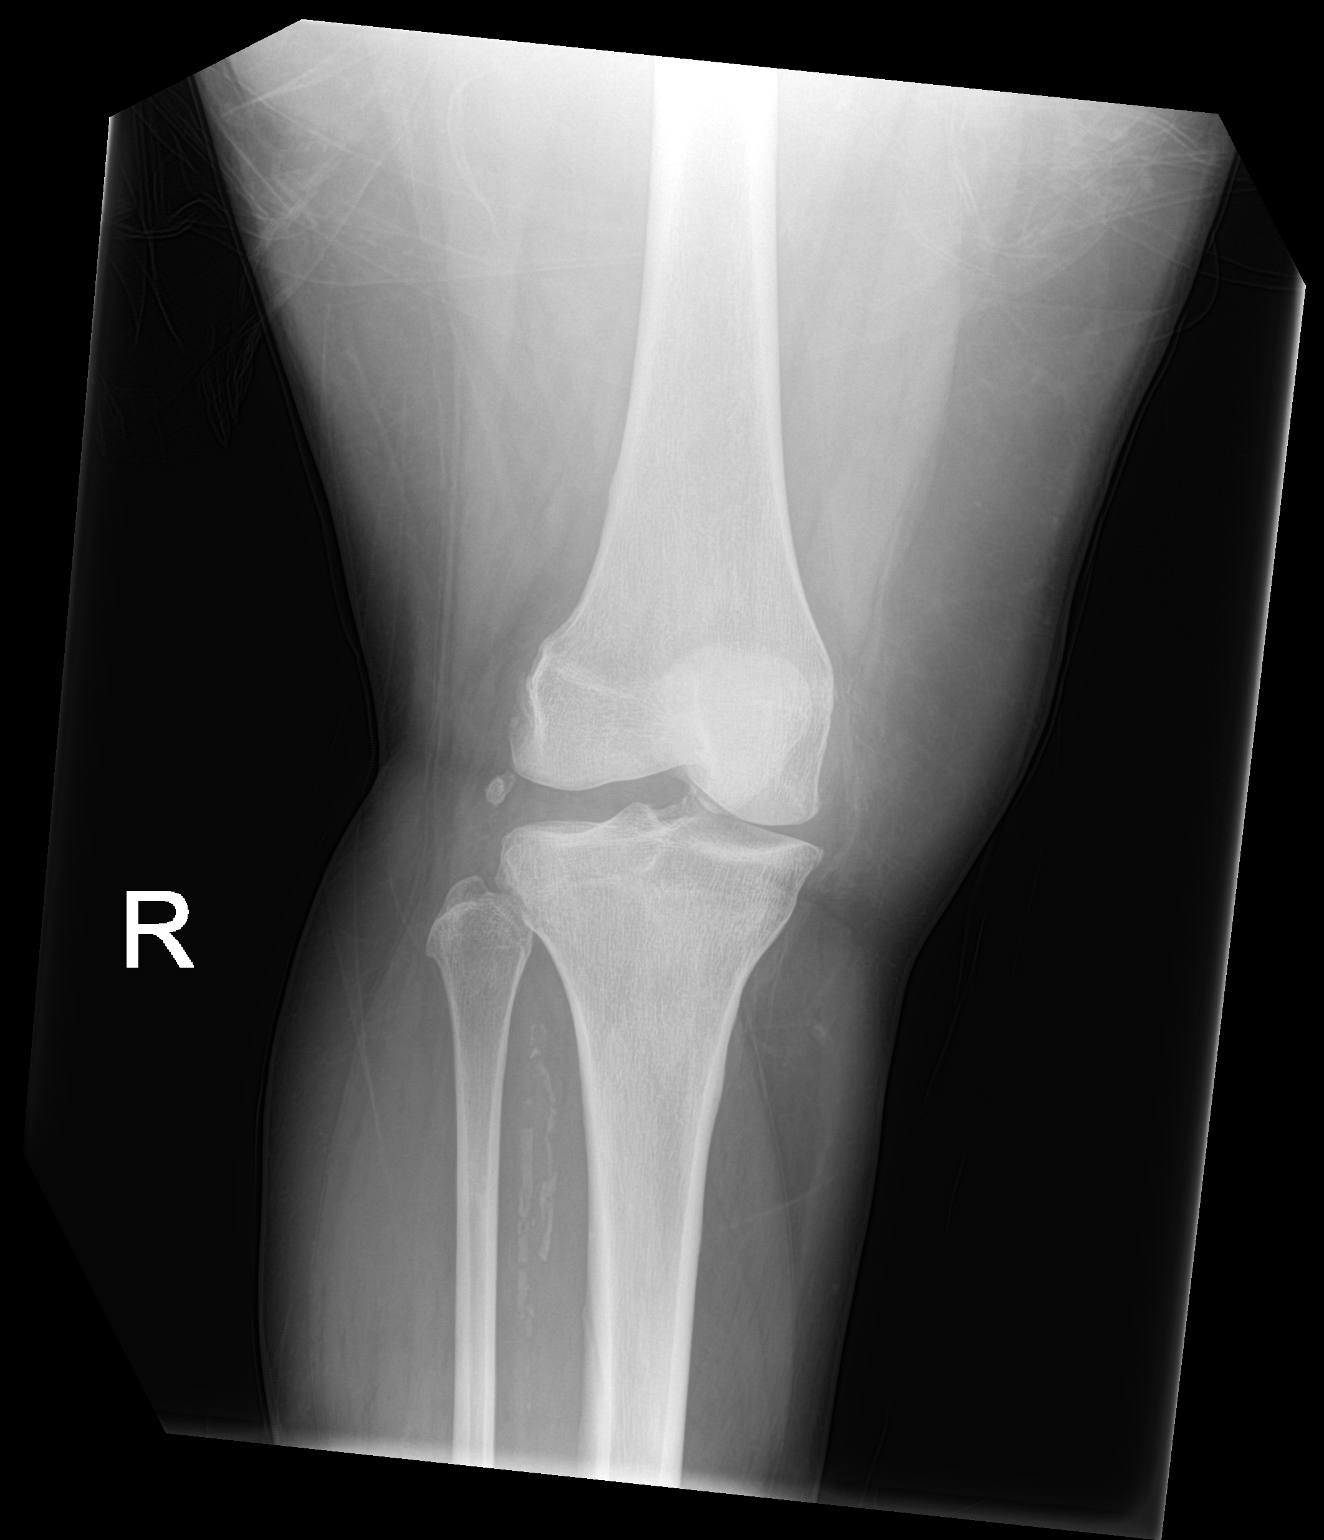

[knee lat]
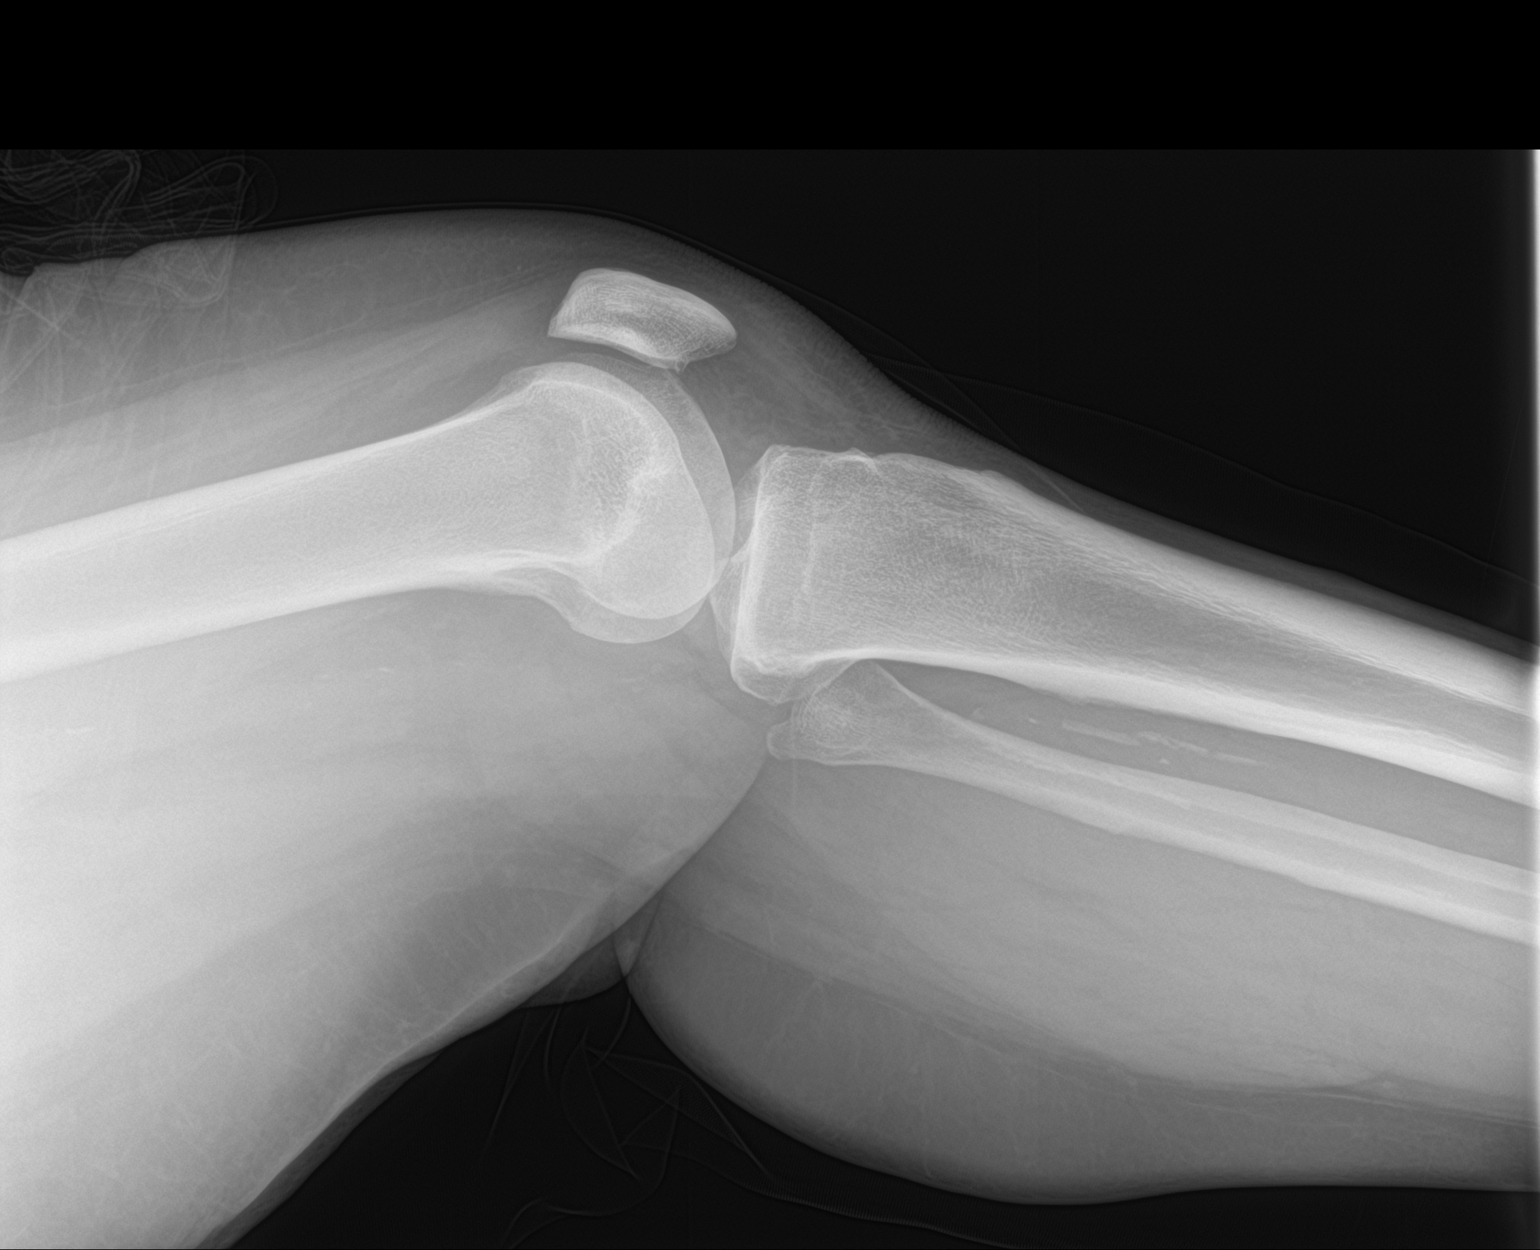

[3 of 3 positions shown; findings below may reference images not displayed]

FINDINGS: Small bony density noted adjacent to the medial femoral condyle
consistent with old medial collateral ligamentous injury. Similar
finding noted on prior study of [DATE]. Small loose body may be
present over the midportion of the femoral tibial joint space. No
acute bony or joint abnormality identified. No knee joint effusion
noted. Peripheral vascular calcification. No radiopaque foreign
body.
IMPRESSION: 1. Small bony density noted adjacent to the medial femoral condyle
consistent with old medial collateral ligamentous injury. Similar
finding noted on prior study of [DATE]. Small loose body may be
present over the midportion of the femoral tibial joint space. No
acute bony abnormality identified.

2.  Peripheral vascular disease.

3.  No radiopaque foreign body.

## 2020-11-22 MED ORDER — GABAPENTIN 300 MG PO CAPS
300.0000 mg | ORAL_CAPSULE | Freq: Three times a day (TID) | ORAL | Status: DC
  Administered 2020-11-22 – 2020-11-30 (×24): 300 mg via ORAL
  Filled 2020-11-22 (×24): qty 1

## 2020-11-22 MED ORDER — TRAMADOL HCL 50 MG PO TABS
100.0000 mg | ORAL_TABLET | Freq: Four times a day (QID) | ORAL | Status: DC | PRN
  Administered 2020-11-22 (×2): 100 mg via ORAL
  Filled 2020-11-22 (×2): qty 2

## 2020-11-22 NOTE — Progress Notes (Signed)
Physical Therapy Treatment Patient Details Name: Cathy Hall MRN: [id number] DOB: Oct 20, 1971 Today's Date: 11/22/2020    History of Present Illness Pt is a 50 y/o female admitted secondary to worsening L sided weakness, difficulty managing secretions and dysarthria. Thought to be secondary to extension of recent CVA. Recently discharged secondary to CVA. PMH inclues CVA, COPD, drug abuse, PTSD.    PT Comments    Pt with improved stability with use of RW for ambulation to help with knee instability.  Will benefit from brace to support knee with amb.   Follow Up Recommendations  SNF;Supervision/Assistance - 24 hour     Equipment Recommendations  Rolling walker with 5" wheels    Recommendations for Other Services       Precautions / Restrictions Precautions Precautions: Fall Precaution Comments: mildly impulsive Restrictions Weight Bearing Restrictions: No    Mobility  Bed Mobility Overal bed mobility: Needs Assistance;Modified Independent             General bed mobility comments: pt sitting EOB upon arrival  Transfers Overall transfer level: Needs assistance Equipment used: Rolling walker (2 wheeled);None Transfers: Sit to/from Stand Sit to Stand: Supervision         General transfer comment: Sit to stand from various surfaces without AD and supervision A for safety.  Ambulation/Gait Ambulation/Gait assistance: Supervision Gait Distance (Feet): 100 Feet Assistive device: Rolling walker (2 wheeled) Gait Pattern/deviations: Decreased stance time - right;Decreased step length - left Gait velocity: decreased   General Gait Details: utilizing UE support with RLE stance due to prior knee injury with known instability   Stairs             Wheelchair Mobility    Modified Rankin (Stroke Patients Only) Modified Rankin (Stroke Patients Only) Pre-Morbid Rankin Score: Slight disability Modified Rankin: Moderate disability     Balance  Overall balance assessment: Needs assistance Sitting-balance support: Feet supported;No upper extremity supported Sitting balance-Leahy Scale: Fair Sitting balance - Comments: Able to don LB clothing with mild LOB. Able to recover without external assist.   Standing balance support: No upper extremity supported;During functional activity Standing balance-Leahy Scale: Fair Standing balance comment: Able to maintain standing balance during LB BADLs in standing without external assist.                            Cognition Arousal/Alertness: Awake/alert Behavior During Therapy: WFL for tasks assessed/performed Overall Cognitive Status: Impaired/Different from baseline Area of Impairment: Problem solving;Attention;Safety/judgement;Awareness                   Current Attention Level: Sustained     Safety/Judgement: Decreased awareness of deficits;Decreased awareness of safety Awareness: Anticipatory Problem Solving: Slow processing;Requires verbal cues;Requires tactile cues General Comments: Patient with decreased problem solving and decreased insight into cognitive deficits.      Exercises      General Comments General comments (skin integrity, edema, etc.): SOB with ADLs. VSS.      Pertinent Vitals/Pain Pain Assessment: No/denies pain    Home Living                      Prior Function            PT Goals (current goals can now be found in the care plan section) Acute Rehab PT Goals Patient Stated Goal: To get knee brace for mobility    Frequency    Min 3X/week  PT Plan      Co-evaluation     PT goals addressed during session: Mobility/safety with mobility;Proper use of DME        AM-PAC PT "6 Clicks" Mobility   Outcome Measure  Help needed turning from your back to your side while in a flat bed without using bedrails?: None Help needed moving from lying on your back to sitting on the side of a flat bed without using  bedrails?: None Help needed moving to and from a bed to a chair (including a wheelchair)?: A Little Help needed standing up from a chair using your arms (e.g., wheelchair or bedside chair)?: A Little Help needed to walk in hospital room?: A Little Help needed climbing 3-5 steps with a railing? : A Lot 6 Click Score: 19    End of Session Equipment Utilized During Treatment: Gait belt Activity Tolerance: Patient tolerated treatment well Patient left: with call bell/phone within reach;in bed Nurse Communication: Mobility status PT Visit Diagnosis: Unsteadiness on feet (R26.81);Muscle weakness (generalized) (M62.81);Other abnormalities of gait and mobility (R26.89)     Time: 0569-7948 PT Time Calculation (min) (ACUTE ONLY): 15 min  Charges:  $Gait Training: 8-22 mins                     Clarita Crane, PT, DPT [phone]    Moshe Cipro 11/22/2020, 1:18 PM

## 2020-11-22 NOTE — Progress Notes (Addendum)
12pm: CSW spoke with DSS Medicaid worker who states there is no pending application for this patient.  CSW spoke with [place] of financial counseling who states she will work with the patient to complete an application.  8am: CSW spoke with Selena Batten at [facility] in [place] who states this patient has been denied admission to the facility.  Edwin Dada, MSW, LCSW-A Transitions of Care  Clinical Social Worker I [phone]

## 2020-11-22 NOTE — Progress Notes (Addendum)
Occupational Therapy Treatment Patient Details Name: Cathy Hall MRN: [id number] DOB: November 30, 1970 Today's Date: 11/22/2020    History of present illness Pt is a 50 y/o female admitted secondary to worsening L sided weakness, difficulty managing secretions and dysarthria. Thought to be secondary to extension of recent CVA. Recently discharged secondary to CVA. PMH inclues CVA, COPD, drug abuse, PTSD.   OT comments  OT treatment session with focus on self-care re-education, safety awareness, ADL transfers, energy conservation, and functional cognition. Patient able to gather ADL items in prep for bathing at shower level with min cues. Fair safety awareness with patient sitting to doff clothing. BSC used in walk-in shower for bathing in sitting. Patient currently functioning at supervision A grossly for all ADLs and ADL transfers with minimal cues for safety. At this time patient still unsafe to return home alone with continued recommendation for SNF rehab. OT will continue to follow acutely.    Follow Up Recommendations  SNF;Supervision/Assistance - 24 hour    Equipment Recommendations  3 in 1 bedside commode    Recommendations for Other Services      Precautions / Restrictions Precautions Precautions: Fall Precaution Comments: mildly impulsive Restrictions Weight Bearing Restrictions: No       Mobility Bed Mobility Overal bed mobility: Needs Assistance;Modified Independent                Transfers Overall transfer level: Needs assistance   Transfers: Sit to/from Stand Sit to Stand: Supervision         General transfer comment: Sit to stand from various surfaces without AD and supervision A for safety.    Balance Overall balance assessment: Needs assistance Sitting-balance support: Feet supported;No upper extremity supported Sitting balance-Leahy Scale: Fair Sitting balance - Comments: Able to don LB clothing with mild LOB. Able to recover without  external assist.   Standing balance support: No upper extremity supported;During functional activity Standing balance-Leahy Scale: Fair Standing balance comment: Able to maintain standing balance during LB BADLs in standing without external assist.                           ADL either performed or assessed with clinical judgement   ADL           Upper Body Bathing: Supervision/ safety;Sitting (BSC in shower)   Lower Body Bathing: Supervison/ safety   Upper Body Dressing : Supervision/safety;Sitting Upper Body Dressing Details (indicate cue type and reason): Able to don UB clothing in seated with supervision A for safety. Lower Body Dressing: Minimal assistance;Cueing for safety Lower Body Dressing Details (indicate cue type and reason): Assist to thread LLE through underwear/pants in sitting. Patient able to stand and hike pants over hips without external assist. Toilet Transfer: Supervision/safety Toilet Transfer Details (indicate cue type and reason): No physical assist needed. Toileting- Clothing Manipulation and Hygiene: Supervision/safety Toileting - Clothing Manipulation Details (indicate cue type and reason): 3/3 parts of toileting task with supervision A for safety.     Functional mobility during ADLs: Supervision/safety General ADL Comments: Supervision A for short-distance functional mobility in room without AD. Cues for safety awareness 2/2 impulsivity.     Vision       Perception     Praxis      Cognition Arousal/Alertness: Awake/alert Behavior During Therapy: WFL for tasks assessed/performed Overall Cognitive Status: Impaired/Different from baseline Area of Impairment: Problem solving;Attention;Safety/judgement;Awareness  Current Attention Level: Sustained     Safety/Judgement: Decreased awareness of deficits;Decreased awareness of safety Awareness: Anticipatory Problem Solving: Slow processing;Requires verbal  cues;Requires tactile cues General Comments: Patient with decreased problem solving and decreased insight into cognitive deficits.        Exercises     Shoulder Instructions       General Comments SOB with ADLs. VSS.    Pertinent Vitals/ Pain       Pain Assessment: No/denies pain  Home Living                                          Prior Functioning/Environment              Frequency  Min 2X/week        Progress Toward Goals  OT Goals(current goals can now be found in the care plan section)  Progress towards OT goals: Progressing toward goals  Acute Rehab OT Goals Patient Stated Goal: To get knee brace for mobility OT Goal Formulation: With patient Time For Goal Achievement: 11/26/20 Potential to Achieve Goals: Good ADL Goals Pt Will Perform Grooming: with modified independence;standing Pt Will Perform Upper Body Dressing: with modified independence Pt Will Perform Lower Body Dressing: with modified independence;sit to/from stand;with adaptive equipment Pt Will Transfer to Toilet: with modified independence;bedside commode;regular height toilet;ambulating Pt Will Perform Toileting - Clothing Manipulation and hygiene: with modified independence;sit to/from stand  Plan Discharge plan remains appropriate;Frequency remains appropriate    Co-evaluation                 AM-PAC OT "6 Clicks" Daily Activity     Outcome Measure   Help from another person eating meals?: None Help from another person taking care of personal grooming?: A Little Help from another person toileting, which includes using toliet, bedpan, or urinal?: A Little Help from another person bathing (including washing, rinsing, drying)?: A Little Help from another person to put on and taking off regular upper body clothing?: A Little Help from another person to put on and taking off regular lower body clothing?: A Little 6 Click Score: 19    End of Session Equipment  Utilized During Treatment: Gait belt  OT Visit Diagnosis: Unsteadiness on feet (R26.81);Other symptoms and signs involving cognitive function   Activity Tolerance Patient tolerated treatment well   Patient Left in bed;with call bell/phone within reach;with bed alarm set   Nurse Communication          Time: 4696-2952 OT Time Calculation (min): 27 min  Charges: OT General Charges $OT Visit: 1 Visit OT Treatments $Self Care/Home Management : 23-37 mins  Cathy Hall H. OTR/L Supplemental OT, Department of rehab services [phone]   Cathy Hall R H. 11/22/2020, 11:09 AM

## 2020-11-22 NOTE — TOC Progression Note (Addendum)
Transition of Care [facility]) - Progression Note    Patient Details  Name: Cathy Hall MRN: [id number] Date of Birth: 10-15-1971  Transition [facility] CM/SW Contact  Janae Bridgeman, RN Phone Number: 11/22/2020, 11:22 AM  Clinical Narrative:    Case management called [facility] in [place], [place] and left a message with Rosey Bath, CM in admission to inquire if the facility was willing to offer an admission bed to the patient.  CM and MSW will continue to follow the patient for SNF admission.  11/22/20 1200- [facility] and [facility] noted to have available open beds at the facilities - faxed patient's clinicals and therapy notes through the hub.    Expected Discharge Plan: Skilled Nursing Facility Barriers to Discharge: No SNF bed,Continued Medical Work up,Inadequate or no insurance  Expected Discharge Plan and Services Expected Discharge Plan: Skilled Nursing Facility In-house Referral: Clinical Social Work,Financial [employer] / Management consultant Discharge Planning Services: CM Consult Post Acute Care Choice: Skilled Nursing Facility Living arrangements for the past 2 months: Single Family Home Expected Discharge Date: 11/16/20                                     Social Determinants of Health (SDOH) Interventions    Readmission Risk Interventions Readmission Risk Prevention Plan 11/16/2020  Transportation Screening Complete  PCP or Specialist Appt within 3-5 Days Complete  HRI or Home Care Consult Complete  Social Work Consult for Recovery Care Planning/Counseling Complete  Palliative Care Screening Complete  Medication Review Oceanographer) Complete
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
## 2020-11-23 ENCOUNTER — Inpatient Hospital Stay (HOSPITAL_COMMUNITY): Payer: Self-pay

## 2020-11-23 DIAGNOSIS — I739 Peripheral vascular disease, unspecified: Secondary | ICD-10-CM | POA: Diagnosis present

## 2020-11-23 DIAGNOSIS — F32A Depression, unspecified: Secondary | ICD-10-CM

## 2020-11-23 DIAGNOSIS — M25561 Pain in right knee: Secondary | ICD-10-CM

## 2020-11-23 DIAGNOSIS — G8929 Other chronic pain: Secondary | ICD-10-CM

## 2020-11-23 DIAGNOSIS — J449 Chronic obstructive pulmonary disease, unspecified: Secondary | ICD-10-CM

## 2020-11-23 DIAGNOSIS — S83519A Sprain of anterior cruciate ligament of unspecified knee, initial encounter: Secondary | ICD-10-CM

## 2020-11-23 DIAGNOSIS — S83511D Sprain of anterior cruciate ligament of right knee, subsequent encounter: Secondary | ICD-10-CM

## 2020-11-23 DIAGNOSIS — K219 Gastro-esophageal reflux disease without esophagitis: Secondary | ICD-10-CM

## 2020-11-23 LAB — GLUCOSE, CAPILLARY
Glucose-Capillary: 144 mg/dL — ABNORMAL HIGH (ref 70–99)
Glucose-Capillary: 147 mg/dL — ABNORMAL HIGH (ref 70–99)
Glucose-Capillary: 150 mg/dL — ABNORMAL HIGH (ref 70–99)
Glucose-Capillary: 182 mg/dL — ABNORMAL HIGH (ref 70–99)

## 2020-11-23 IMAGING — MR MR KNEE*R* W/O CM
4 of 7 series · 11 of 40 positions shown · non-contrast
Comparison: None.

CLINICAL DATA: Knee trauma and pain

EXAM:
MRI OF THE RIGHT KNEE WITHOUT CONTRAST
TECHNIQUE: Multiplanar, multisequence MR imaging of the knee was performed. No
intravenous contrast was administered.

[Series 2: T2 fat-sat · axial · 4.0mm · 0.31mm/px · z∈[-59,+56]mm · 3 of 35 slices shown (1 of 2)]
[im 6/35]
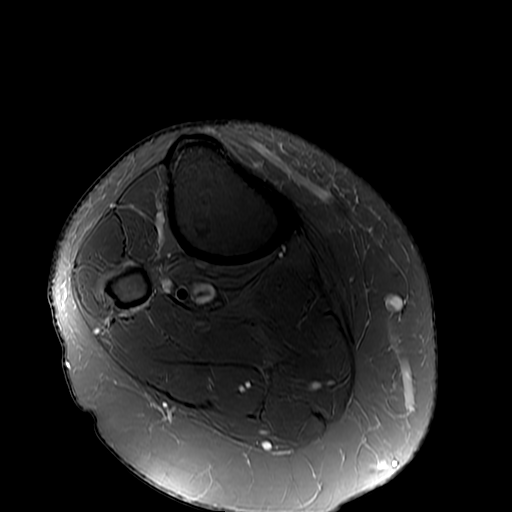
[im 18/35]
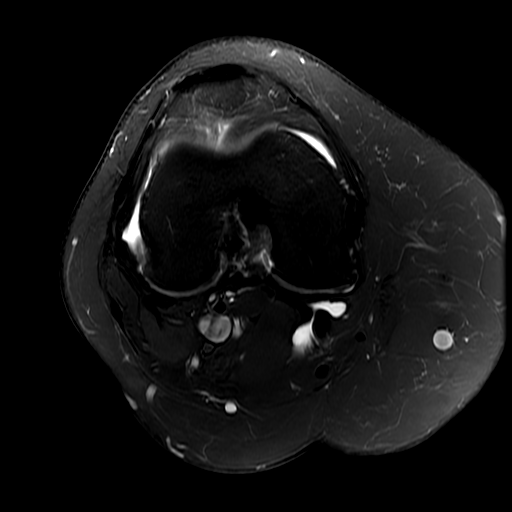
[im 29/35]
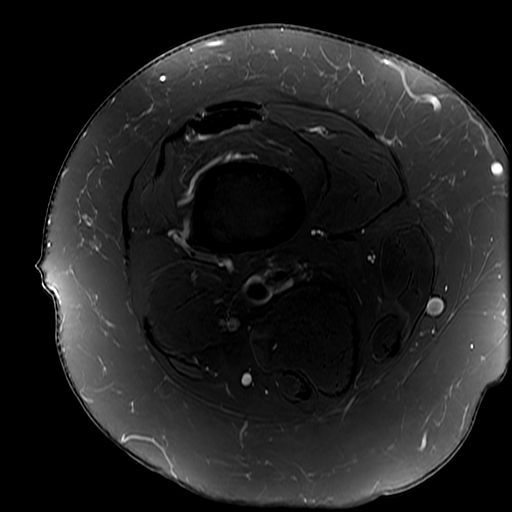

[Series 4: PD fat-sat · coronal · 4.0mm · 0.16mm/px · 3 of 29 slices shown (1 of 2)]
[im 1/29]
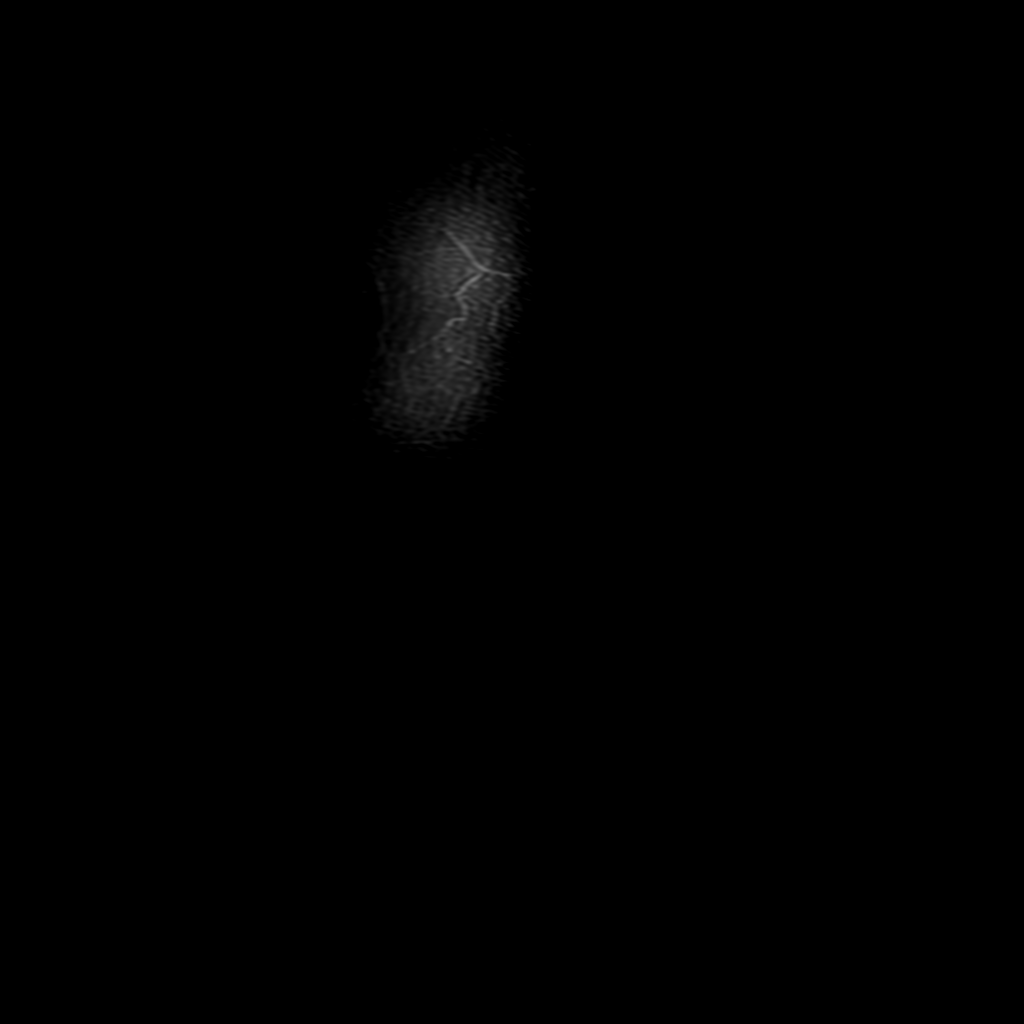
[im 15/29]
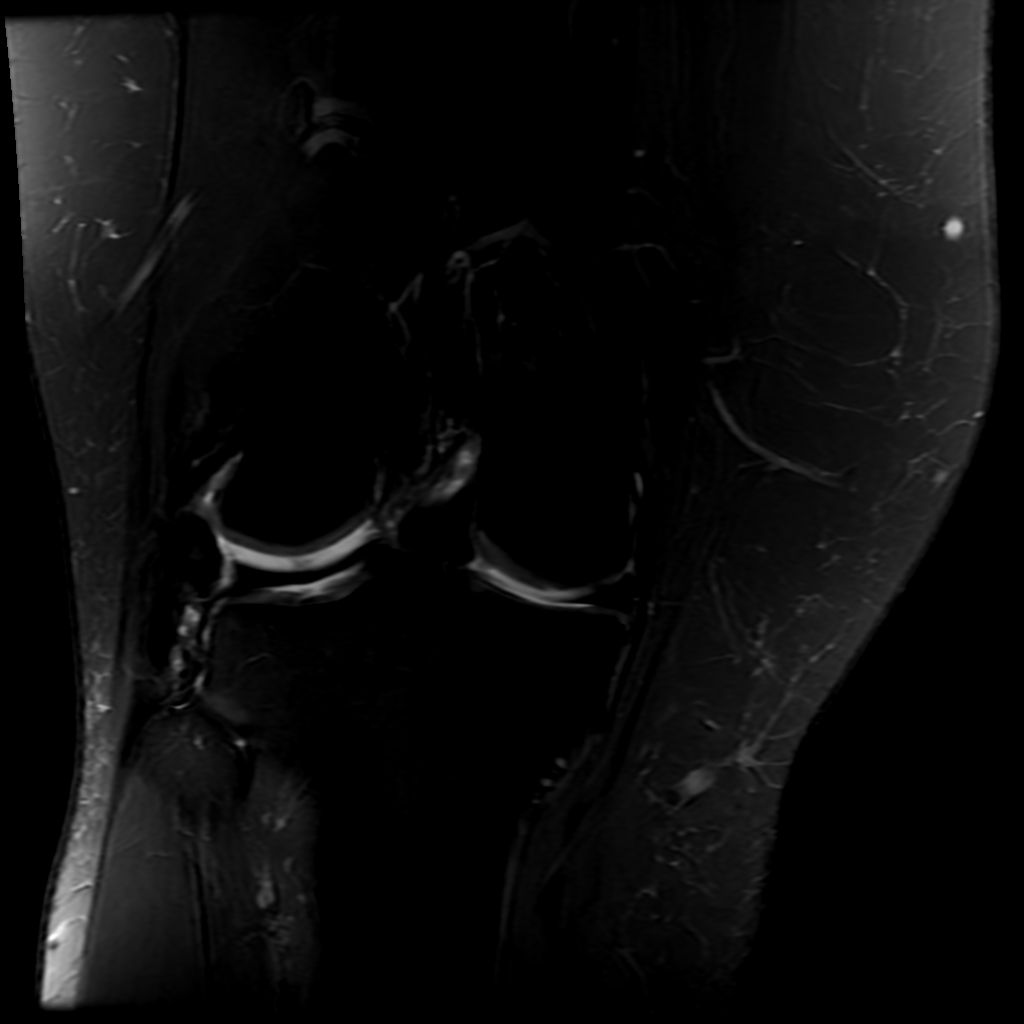
[im 29/29]
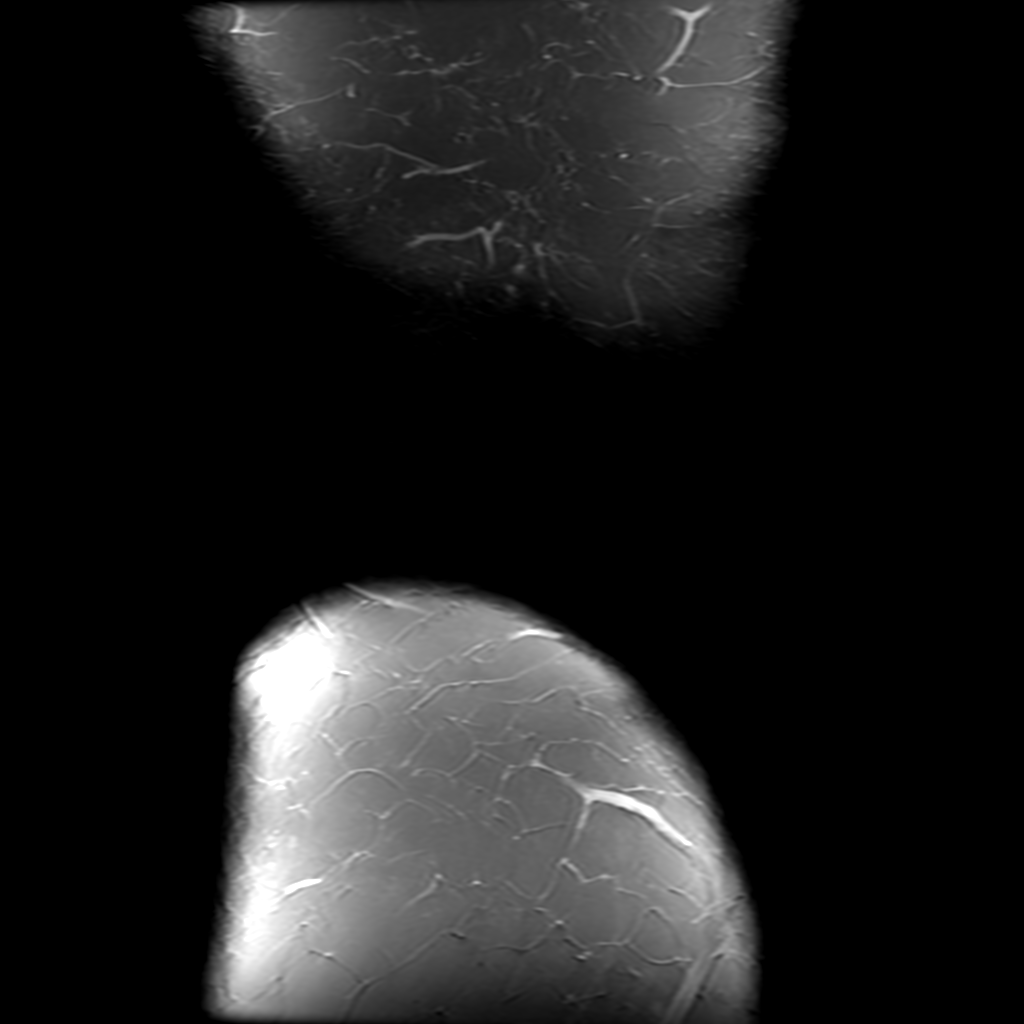

[Series 5: PD fat-sat · sagittal · 3.0mm · 0.29mm/px · 3 of 37 slices shown (2 of 2)]
[im 8/37]
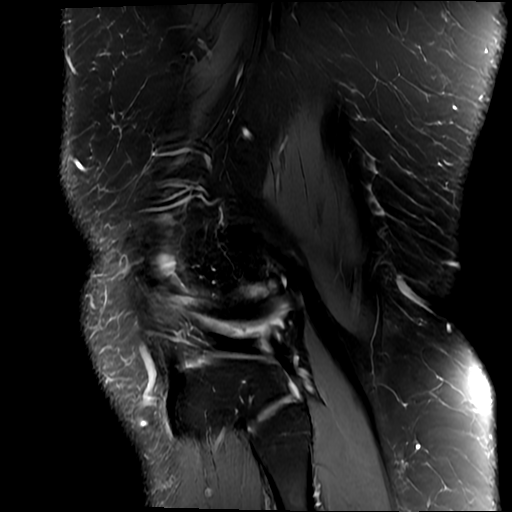
[im 22/37]
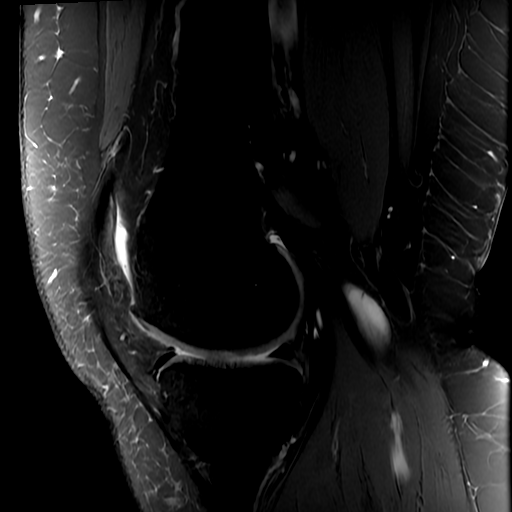
[im 37/37]
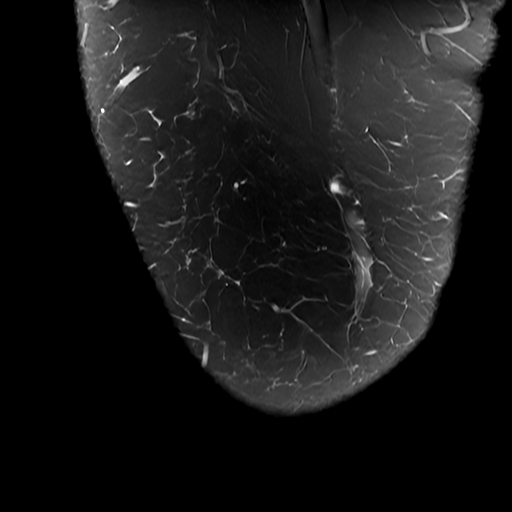

[Series 6: T2 fat-sat · sagittal · 3.0mm · 0.29mm/px · 2 of 37 slices shown (2 of 2)]
[im 8/37]
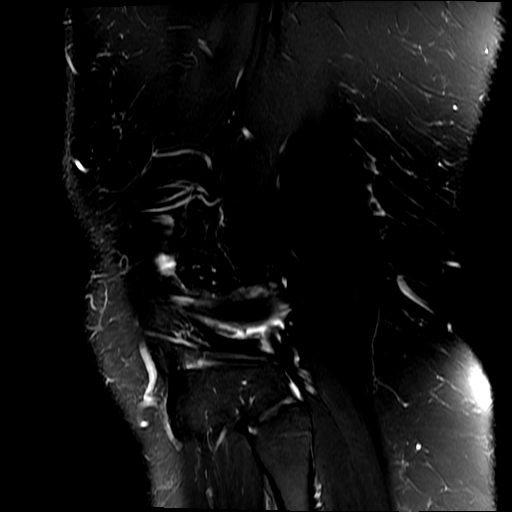
[im 22/37]
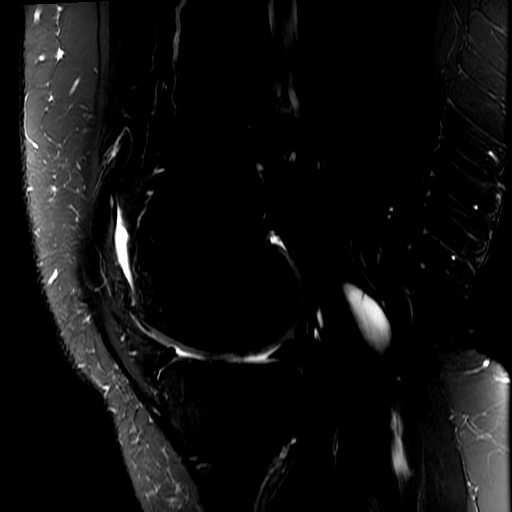

[11 of 40 positions shown; findings below may reference images not displayed]

FINDINGS: MENISCI

Medial: Intact.

Lateral: There is a nondisplaced horizontal longitudinal tear seen
of the posterior horn of the lateral meniscus extending to the mid
body. There is a meniscal flounce noted within the mid body.

LIGAMENTS

Cruciates: Increased signal seen at the anterior ACL, however there
are intact fibers seen throughout. The PCL is intact.

Collaterals: The MCL is intact. The lateral collateral ligamentous
complex is intact.

CARTILAGE

Patellofemoral: Focal chondral fissuring seen within the lateral
patellar facet with underlying subchondral cystic changes.

Medial compartment: Chondral fissuring seen the weight-bearing
surface of the medial femoral condyle with area of focal
delamination measuring 4 mm there is chondral thinning seen in the
medial tibial plateau.

Lateral compartment: Mild chondral thinning seen the weight-bearing
surface of the lateral femoral condyle.

BONES: No fracture. No avascular necrosis. No pathologic marrow
infiltration.

JOINT: No joint effusion. Normal RAISON. No plical
thickening.

EXTENSOR MECHANISM: The patellar and quadriceps tendon are intact.
The retinaculum is unremarkable.

POPLITEAL FOSSA: A loculated popliteal cyst is present without
evidence of rupture.

OTHER:  The visualized muscles are normal in appearance.
IMPRESSION: Nondisplaced tear of the posterior lateral meniscus with meniscal
flounce.

Intrasubstance degeneration of the anterior ACL, however is intact.

Tricompartmental chondral disease, most notable within the
patellofemoral compartment.

## 2020-11-23 MED ORDER — LORAZEPAM 2 MG/ML IJ SOLN: 0.5000 mg | Freq: Two times a day (BID) | INTRAMUSCULAR | Status: DC | PRN

## 2020-11-23 MED ORDER — MENTHOL 3 MG MT LOZG
1.0000 | LOZENGE | OROMUCOSAL | Status: DC | PRN
  Filled 2020-11-23 (×2): qty 9

## 2020-11-23 MED ORDER — TIOTROPIUM BROMIDE MONOHYDRATE 18 MCG IN CAPS: 18.0000 ug | ORAL_CAPSULE | Freq: Every day | RESPIRATORY_TRACT | Status: DC

## 2020-11-23 MED ORDER — OXYCODONE HCL 5 MG PO TABS
5.0000 mg | ORAL_TABLET | ORAL | Status: DC | PRN
  Administered 2020-11-23 – 2020-11-24 (×5): 5 mg via ORAL
  Filled 2020-11-23 (×5): qty 1

## 2020-11-23 MED ORDER — UMECLIDINIUM BROMIDE 62.5 MCG/INH IN AEPB
1.0000 | INHALATION_SPRAY | Freq: Every day | RESPIRATORY_TRACT | Status: DC
  Administered 2020-11-24 – 2020-11-30 (×7): 1 via RESPIRATORY_TRACT
  Filled 2020-11-23 (×2): qty 7

## 2020-11-23 NOTE — Progress Notes (Signed)
Orthopedic Tech Progress Note Patient Details:  [name] [facility] 1970-12-28 [id number] Called in  Patient ID: Cathy Hall, female   DOB: 04/16/1971, 50 y.o.   MRN: [id number]   Cathy Hall 11/23/2020, 3:07 PM
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
## 2020-11-23 NOTE — Progress Notes (Signed)
   11/23/20 2300  What Happened  Was fall witnessed? No  Was patient injured? No  Patient found on floor  Found by Staff-comment (Nurse Tech)  Stated prior activity ambulating-unassisted  Follow Up  MD notified Dr Tim Lair  Time MD notified 2250  Family notified No - patient refusal  Additional tests No  Progress note created (see row info) Yes  Adult Fall Risk Assessment  Risk Factor Category (scoring not indicated) Fall has occurred during this admission (document High fall risk)  Age 50  Fall History: Fall within 6 months prior to admission 0  Elimination; Bowel and/or Urine Incontinence 0  Elimination; Bowel and/or Urine Urgency/Frequency 0  Medications: includes PCA/Opiates, Anti-convulsants, Anti-hypertensives, Diuretics, Hypnotics, Laxatives, Sedatives, and Psychotropics 3  Patient Care Equipment 1  Mobility-Assistance 2  Mobility-Gait 0  Mobility-Sensory Deficit 0  Altered awareness of immediate physical environment 0  Impulsiveness 0  Lack of understanding of one's physical/cognitive limitations 0  Total Score 6  Patient Fall Risk Level High fall risk  Adult Fall Risk Interventions  Required Bundle Interventions *See Row Information* High fall risk - low, moderate, and high requirements implemented  Additional Interventions Use of appropriate toileting equipment (bedpan, BSC, etc.)  Screening for Fall Injury Risk (To be completed on HIGH fall risk patients who do not meet crieteria for Low Bed) - Assessing Need for Floor Mats Only  Risk For Fall Injury- Criteria for Floor Mats None identified - No additional interventions needed  Pain Assessment  Pain Scale 0-10  Pain Score 6  Pain Type Acute pain  Pain Location Knee  Pain Orientation Right  Neurological  Neuro (WDL) WDL  Level of Consciousness Alert  Orientation Level Oriented X4  Cognition Appropriate at baseline;Follows commands  Speech Clear  Musculoskeletal  Musculoskeletal (WDL) X  Assistive Device Four  wheel walker  Musculoskeletal Details  RLE Ortho/Supportive Device;Weakness  RLE Ortho/Supportive Device Brace (Comment)

## 2020-11-23 NOTE — Progress Notes (Signed)
Pt found sitting on the floor towards the exit door in the room, said to have slipped and fell on her buttocks as she was ambulating without assistance, pt had a leg brace on her right leg, pt assisted back to bed, denies any discomfort, pt however refused her family notified of the fall, On call Doc notified, pt reassured and will continue to monitor, v/s stable. Obasogie-Asidi, Vasilis Luhman Efe
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
## 2020-11-23 NOTE — Progress Notes (Signed)
ABI completed.   Please see CV Proc for preliminary results.   Kloe Oates, RVT  
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
## 2020-11-24 ENCOUNTER — Inpatient Hospital Stay (HOSPITAL_COMMUNITY): Payer: Self-pay

## 2020-11-24 LAB — GLUCOSE, CAPILLARY
Glucose-Capillary: 118 mg/dL — ABNORMAL HIGH (ref 70–99)
Glucose-Capillary: 136 mg/dL — ABNORMAL HIGH (ref 70–99)
Glucose-Capillary: 146 mg/dL — ABNORMAL HIGH (ref 70–99)

## 2020-11-24 LAB — BASIC METABOLIC PANEL
Anion gap: 13 (ref 5–15)
BUN: 15 mg/dL (ref 6–20)
CO2: 25 mmol/L (ref 22–32)
Calcium: 8.7 mg/dL — ABNORMAL LOW (ref 8.9–10.3)
Chloride: 101 mmol/L (ref 98–111)
Creatinine, Ser: 0.77 mg/dL (ref 0.44–1.00)
GFR, Estimated: >60 mL/min (ref >60)
Glucose, Bld: 136 mg/dL — ABNORMAL HIGH (ref 70–99)
Potassium: 4.4 mmol/L (ref 3.5–5.1)
Sodium: 139 mmol/L (ref 135–145)

## 2020-11-24 LAB — BRAIN NATRIURETIC PEPTIDE: B Natriuretic Peptide: 58.2 pg/mL (ref 0.0–100.0)

## 2020-11-24 IMAGING — CR DG CHEST 2V
2 series · 2 of 2 positions shown · non-contrast
Comparison: [DATE]

CLINICAL DATA: Short of breath

EXAM:
CHEST - 2 VIEW

[chest ap]
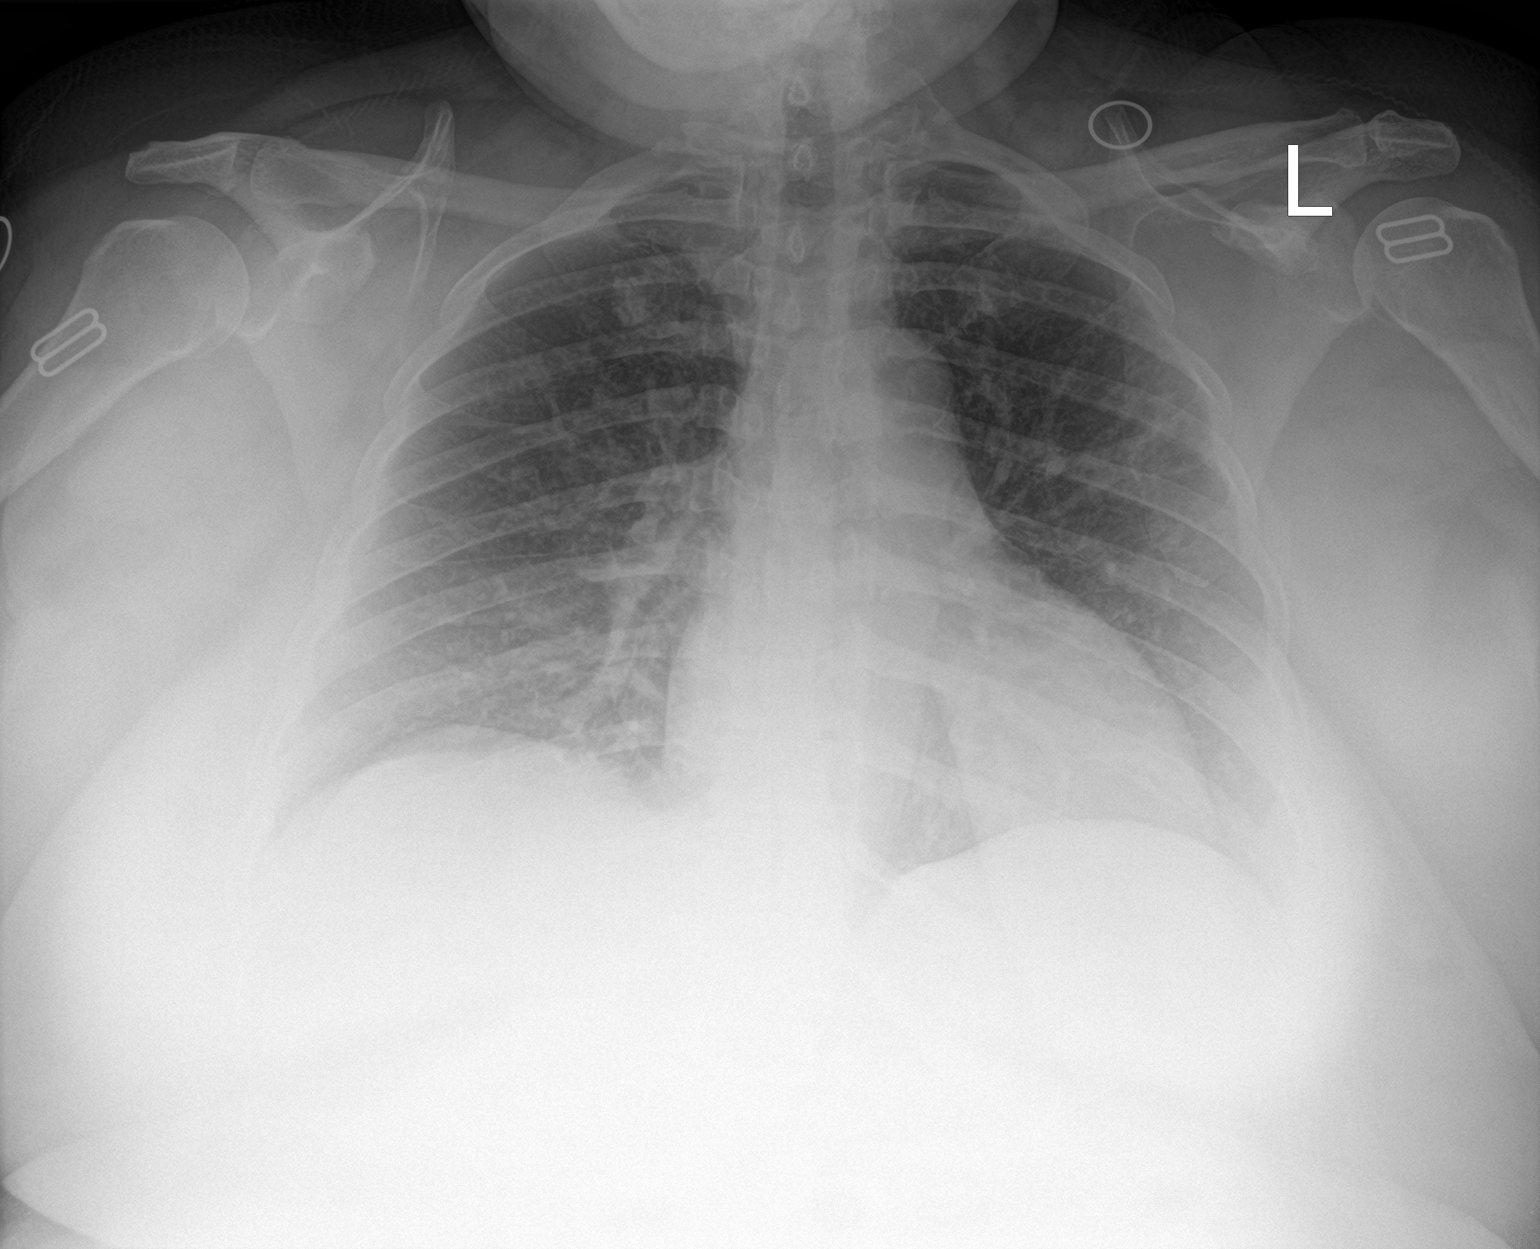

[chest lat]
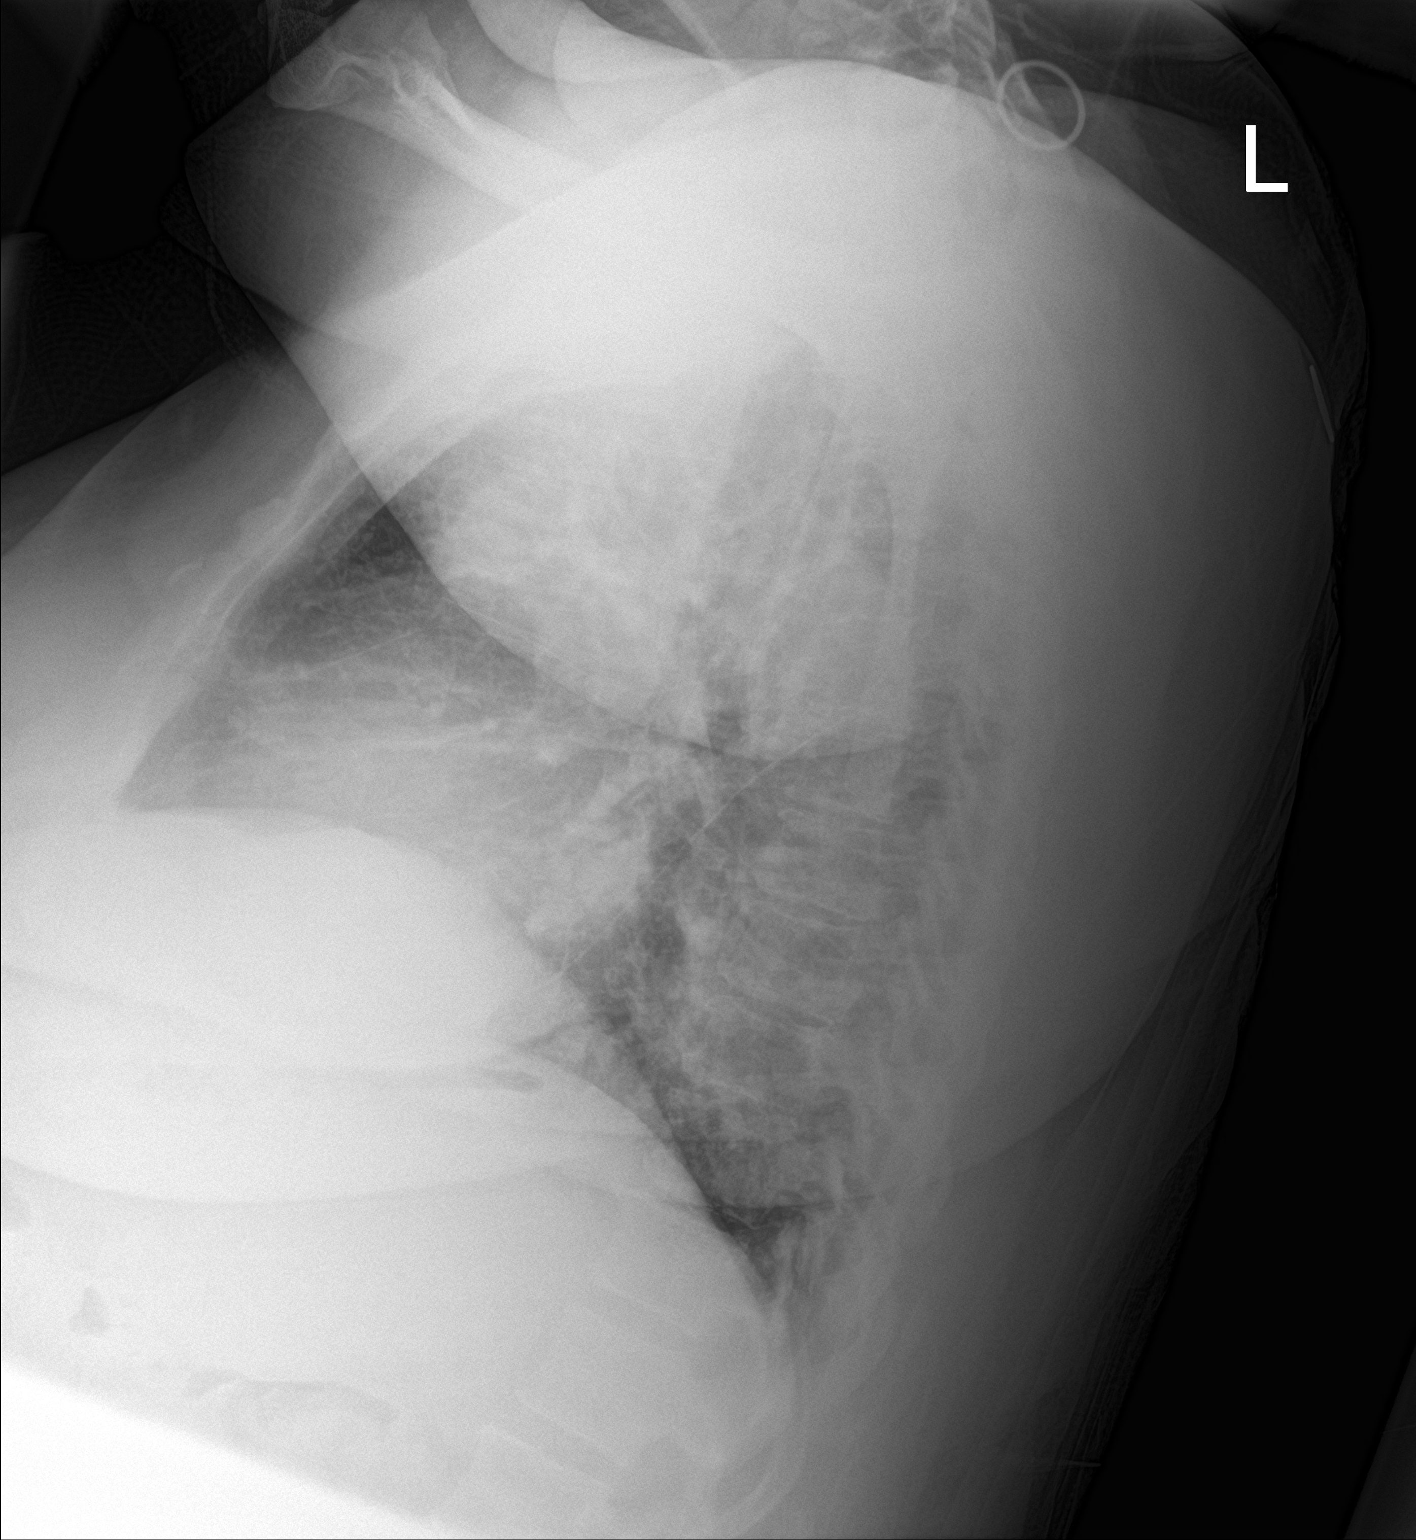

[2 of 2 positions shown; findings below may reference images not displayed]

FINDINGS: Heart size upper normal. Vascularity normal. Mild right lower lobe
atelectasis. Negative for infiltrate or effusion.
IMPRESSION: Mild right lower lobe atelectasis.

## 2020-11-24 MED ORDER — FLUTICASONE PROPIONATE 50 MCG/ACT NA SUSP
2.0000 | Freq: Every day | NASAL | Status: DC
  Administered 2020-11-24 – 2020-11-30 (×7): 2 via NASAL
  Filled 2020-11-24: qty 16

## 2020-11-24 MED ORDER — LORATADINE 10 MG PO TABS
10.0000 mg | ORAL_TABLET | Freq: Every day | ORAL | Status: DC
  Administered 2020-11-24 – 2020-11-30 (×7): 10 mg via ORAL
  Filled 2020-11-24 (×7): qty 1

## 2020-11-24 MED ORDER — FUROSEMIDE 10 MG/ML IJ SOLN
20.0000 mg | Freq: Once | INTRAMUSCULAR | Status: AC
  Administered 2020-11-24: 20 mg via INTRAVENOUS
  Filled 2020-11-24: qty 4

## 2020-11-24 MED ORDER — OXYCODONE HCL 5 MG PO TABS
7.5000 mg | ORAL_TABLET | ORAL | Status: DC | PRN
  Administered 2020-11-24 – 2020-11-30 (×31): 7.5 mg via ORAL
  Filled 2020-11-24 (×31): qty 2

## 2020-11-24 NOTE — TOC Progression Note (Signed)
Transition of Care [facility]) - Progression Note    Patient Details  Name: Cathy Hall MRN: [id number] Date of Birth: 16-Aug-1971  [facility] CM/SW Contact  Janae Bridgeman, RN Phone Number: 11/24/2020, 11:33 AM  Clinical Narrative:    Case management called and left a follow up message with Rosey Bath, CM at [facility] in [place], [place] for possible admission under LOG offered by [facility].  Clinicals were initially faxed to the facility on 11/18/2020.  I also called and left a message with CM at [facility] in [place] [phone]) as well.  Medicaid application was confirmed to have been started by [facility] Financial counselers  CM will continue to follow for SNF placement.   Expected Discharge Plan: Skilled Nursing Facility Barriers to Discharge: No SNF bed,Continued Medical Work up,Inadequate or no insurance  Expected Discharge Plan and Services Expected Discharge Plan: Skilled Nursing Facility In-house Referral: Clinical Social Work,Financial [employer] / Management consultant Discharge Planning Services: CM Consult Post Acute Care Choice: Skilled Nursing Facility Living arrangements for the past 2 months: Single Family Home Expected Discharge Date: 11/16/20                                     Social Determinants of Health (SDOH) Interventions    Readmission Risk Interventions Readmission Risk Prevention Plan 11/16/2020  Transportation Screening Complete  PCP or Specialist Appt within 3-5 Days Complete  HRI or Home Care Consult Complete  Social Work Consult for Recovery Care Planning/Counseling Complete  Palliative Care Screening Complete  Medication Review Oceanographer) Complete
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
## 2020-11-25 LAB — BASIC METABOLIC PANEL
Anion gap: 15 (ref 5–15)
BUN: 16 mg/dL (ref 6–20)
CO2: 24 mmol/L (ref 22–32)
Calcium: 8.9 mg/dL (ref 8.9–10.3)
Chloride: 99 mmol/L (ref 98–111)
Creatinine, Ser: 0.71 mg/dL (ref 0.44–1.00)
GFR, Estimated: >60 mL/min (ref >60)
Glucose, Bld: 124 mg/dL — ABNORMAL HIGH (ref 70–99)
Potassium: 4.9 mmol/L (ref 3.5–5.1)
Sodium: 138 mmol/L (ref 135–145)

## 2020-11-25 LAB — GLUCOSE, CAPILLARY
Glucose-Capillary: 136 mg/dL — ABNORMAL HIGH (ref 70–99)
Glucose-Capillary: 203 mg/dL — ABNORMAL HIGH (ref 70–99)
Glucose-Capillary: 104 mg/dL — ABNORMAL HIGH (ref 70–99)

## 2020-11-25 MED ORDER — NYSTATIN 100000 UNIT/ML MT SUSP
5.0000 mL | Freq: Four times a day (QID) | OROMUCOSAL | Status: DC
  Administered 2020-11-25 – 2020-11-30 (×21): 500000 [IU] via ORAL
  Filled 2020-11-25 (×21): qty 5

## 2020-11-25 NOTE — Progress Notes (Signed)
  Speech Language Pathology Treatment: Cognitive-Linquistic  Patient Details Name: Cathy Hall MRN: [id number] DOB: 1971/01/08 Today's Date: 11/25/2020 Time: 2979-8921 SLP Time Calculation (min) (ACUTE ONLY): 24 min  Assessment / Plan / Recommendation Clinical Impression  Cathy Hall exhibited decreased safety awareness with impulsivity and abruptly began to get out of bed for the bathroom. Pt requested walker, putting hands on walker to stand after cued to use bed. Speech was intelligible 95% of the time giving feedback to slow her rate. Speech improved throughout session and question respiration and phonation coordination and sounds forced on exhalation.  Assist needed to anticipate and verbally solve problems however verbal responses are more accurate than her execution. She requested her wheel chair to propel in her room because of weight bearing restrictions (?). She became teary when relaying her challenges past 1.5 years.    HPI HPI: Cathy Hall is a 50 y.o. female with history of COPD cocaine abuse hypertension just discharged today earlier after being admitted for stroke with some left-sided weakness while sitting at home experienced worsening dysarthria and also difficulty managing her secretions.   CT shows A known small subacute infarct within the posterior left midbrain from prior admission on 1/3, but there is concern for an new insult as well. Repeat MRI brain was attempted but due to worsening secretion and was unable to complete it.      SLP Plan  Continue with current plan of care       Recommendations                   Oral Care Recommendations: Oral care BID Follow up Recommendations: Skilled Nursing facility SLP Visit Diagnosis: Cognitive communication deficit (J94.174) Plan: Continue with current plan of care                       Royce Macadamia 11/25/2020, 4:25 PM  Breck Coons Lonell Face.Ed Presenter, broadcasting [phone] Office [phone]

## 2020-11-25 NOTE — Progress Notes (Signed)
Occupational Therapy Treatment Patient Details Name: Cathy Hall MRN: [id number] DOB: 25-Feb-1971 Today's Date: 11/25/2020    History of present illness Pt is a 50 y/o female admitted secondary to worsening L sided weakness, difficulty managing secretions and dysarthria. Thought to be secondary to extension of recent CVA. Recently discharged secondary to CVA. PMH inclues CVA, COPD, drug abuse, PTSD.   OT comments  Patient met lying supine in bed reporting not feeling well. Patient with cough and increased secretions. Education on use of flutter valve and I-S at bedside. Patient able to return demo with good accuracy. Education provided on BUE HEP with focus on affected LUE. Patient able to return demo with occasional cues for pacing/form. Patient would benefit from continued acute OT services to maximize safety and independence with self-care tasks in prep for safe d/c to next level of care. Continued recommendation for SNF rehab given patient cognitive deficits and decreased family support. If patient had 24hr supervision/assist from family recommendation would be for return home.    Follow Up Recommendations  SNF;Supervision/Assistance - 24 hour    Equipment Recommendations  3 in 1 bedside commode    Recommendations for Other Services      Precautions / Restrictions Precautions Precautions: Fall Precaution Comments: mildly impulsive Restrictions Weight Bearing Restrictions: No       Mobility Bed Mobility Overal bed mobility: Modified Independent                Transfers Overall transfer level: Needs assistance Equipment used: Rolling walker (2 wheeled);None Transfers: Sit to/from Stand Sit to Stand: Supervision              Balance     Sitting balance-Leahy Scale: Good       Standing balance-Leahy Scale: Fair                             ADL either performed or assessed with clinical judgement   ADL                                                Vision       Perception     Praxis      Cognition Arousal/Alertness: Awake/alert Behavior During Therapy: WFL for tasks assessed/performed                                 Problem Solving: Slow processing;Requires verbal cues;Requires tactile cues General Comments: Patient with decreased problem solving and decreased insight into cognitive deficits.        Exercises     Shoulder Instructions       General Comments Patient with increased secretions. Education on flutter valve and I-S at bedside and importance of sitting in recliner several times/day. Patient expressed verbal understanding.    Pertinent Vitals/ Pain       Pain Assessment: No/denies pain  Home Living                                          Prior Functioning/Environment              Frequency  Min 2X/week  Progress Toward Goals  OT Goals(current goals can now be found in the care plan section)  Progress towards OT goals: Progressing toward goals  Acute Rehab OT Goals Patient Stated Goal: To get better OT Goal Formulation: With patient Time For Goal Achievement: 12/09/20 Potential to Achieve Goals: Good ADL Goals Pt Will Perform Grooming: with modified independence;standing Pt Will Perform Upper Body Dressing: with modified independence Pt Will Perform Lower Body Dressing: with modified independence;sit to/from stand;with adaptive equipment Pt Will Transfer to Toilet: with modified independence;bedside commode;regular height toilet;ambulating Pt Will Perform Toileting - Clothing Manipulation and hygiene: with modified independence;sit to/from stand  Plan Discharge plan remains appropriate;Frequency remains appropriate    Co-evaluation                 AM-PAC OT "6 Clicks" Daily Activity     Outcome Measure   Help from another person eating meals?: None Help from another person taking care of personal  grooming?: A Little Help from another person toileting, which includes using toliet, bedpan, or urinal?: A Little Help from another person bathing (including washing, rinsing, drying)?: A Little Help from another person to put on and taking off regular upper body clothing?: A Little Help from another person to put on and taking off regular lower body clothing?: A Little 6 Click Score: 19    End of Session    OT Visit Diagnosis: Unsteadiness on feet (R26.81);Other symptoms and signs involving cognitive function   Activity Tolerance Patient tolerated treatment well   Patient Left in bed;with call bell/phone within reach;with bed alarm set   Nurse Communication          Time: 1212-1225 OT Time Calculation (min): 13 min  Charges: OT General Charges $OT Visit: 1 Visit OT Treatments $Self Care/Home Management : 8-22 mins  Wesleigh Markovic H. OTR/L Supplemental OT, Department of rehab services [phone]   Aerionna Moravek R H. 11/25/2020, 1:01 PM

## 2020-11-25 NOTE — Progress Notes (Signed)
Physical Therapy Treatment Patient Details Name: Cathy Hall MRN: [id number] DOB: [birth year] Today's Date: 11/25/2020    History of Present Illness Pt is a 50 y/o female admitted secondary to worsening L sided weakness, difficulty managing secretions and dysarthria. Thought to be secondary to extension of recent CVA. Recently discharged secondary to CVA. PMH inclues CVA, COPD, drug abuse, PTSD.    PT Comments    Pt tolerates treatment well despite reports of R knee pain. Pt continues to ambulate with reduced gait speed, knee brace providing R knee support without evidence of knee buckling this session. Pt is able to ambulate for increased distances but requires increased time for all activities. Pt has little caregiver assistance and a flight of stairs to reach her bedroom, thus PT continues to recommend SNF placement at the time of discharge. Pt will benefit from initiation of stair training next session.   Follow Up Recommendations  SNF;Supervision/Assistance - 24 hour     Equipment Recommendations  Rolling walker with 5" wheels    Recommendations for Other Services       Precautions / Restrictions Precautions Precautions: Fall Precaution Comments: mildly impulsive Restrictions Weight Bearing Restrictions: No    Mobility  Bed Mobility Overal bed mobility: Modified Independent Bed Mobility: Sit to Supine       Sit to supine: Modified independent (Device/Increase time)      Transfers Overall transfer level: Needs assistance Equipment used: Rolling walker (2 wheeled);None Transfers: Sit to/from Stand Sit to Stand: Supervision         General transfer comment: pt requires multiple attempts to stand from lower surfaces without physical assistance, eventually becomes successful with use of RW  Ambulation/Gait Ambulation/Gait assistance: Min guard;Supervision Gait Distance (Feet): 120 Feet Assistive device: Rolling walker (2 wheeled) Gait  Pattern/deviations: Step-to pattern Gait velocity: reduced Gait velocity interpretation: <1.31 ft/sec, indicative of household ambulator General Gait Details: pt with short step-to gait, reduced stance time on RLE   Stairs             Wheelchair Mobility    Modified Rankin (Stroke Patients Only) Modified Rankin (Stroke Patients Only) Pre-Morbid Rankin Score: Slight disability Modified Rankin: Moderate disability     Balance Overall balance assessment: Needs assistance Sitting-balance support: No upper extremity supported;Feet supported Sitting balance-Leahy Scale: Good     Standing balance support: No upper extremity supported;During functional activity Standing balance-Leahy Scale: Fair                              Cognition Arousal/Alertness: Awake/alert Behavior During Therapy: WFL for tasks assessed/performed Overall Cognitive Status: Impaired/Different from baseline Area of Impairment: Awareness                           Awareness: Anticipatory Problem Solving: Slow processing;Requires verbal cues;Requires tactile cues General Comments: Patient with decreased problem solving and decreased insight into cognitive deficits.      Exercises      General Comments General comments (skin integrity, edema, etc.): Patient with increased secretions. Education on flutter valve and I-S at bedside and importance of sitting in recliner several times/day. Patient expressed verbal understanding.      Pertinent Vitals/Pain Pain Assessment: No/denies pain Faces Pain Scale: Hurts even more Pain Location: R knee Pain Descriptors / Indicators: Grimacing Pain Intervention(s): Monitored during session    Home Living  Prior Function            PT Goals (current goals can now be found in the care plan section) Acute Rehab PT Goals Patient Stated Goal: To get better PT Goal Formulation: With patient Time For Goal  Achievement: 12/09/20 Potential to Achieve Goals: Good Progress towards PT goals: Progressing toward goals    Frequency    Min 3X/week      PT Plan Current plan remains appropriate    Co-evaluation              AM-PAC PT "6 Clicks" Mobility   Outcome Measure  Help needed turning from your back to your side while in a flat bed without using bedrails?: None Help needed moving from lying on your back to sitting on the side of a flat bed without using bedrails?: None Help needed moving to and from a bed to a chair (including a wheelchair)?: A Little Help needed standing up from a chair using your arms (e.g., wheelchair or bedside chair)?: A Little Help needed to walk in hospital room?: A Little Help needed climbing 3-5 steps with a railing? : A Lot 6 Click Score: 19    End of Session   Activity Tolerance: Patient tolerated treatment well Patient left: in bed;with call bell/phone within reach;with bed alarm set Nurse Communication: Mobility status PT Visit Diagnosis: Unsteadiness on feet (R26.81);Muscle weakness (generalized) (M62.81);Other abnormalities of gait and mobility (R26.89)     Time: 2423-5361 PT Time Calculation (min) (ACUTE ONLY): 17 min  Charges:  $Gait Training: 8-22 mins                     Arlyss Gandy, PT, DPT Acute Rehabilitation Pager: [phone]    Arlyss Gandy 11/25/2020, 1:11 PM
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
## 2020-11-26 ENCOUNTER — Inpatient Hospital Stay (HOSPITAL_COMMUNITY): Payer: Self-pay

## 2020-11-26 LAB — GLUCOSE, CAPILLARY
Glucose-Capillary: 130 mg/dL — ABNORMAL HIGH (ref 70–99)
Glucose-Capillary: 183 mg/dL — ABNORMAL HIGH (ref 70–99)
Glucose-Capillary: 173 mg/dL — ABNORMAL HIGH (ref 70–99)

## 2020-11-26 LAB — SARS CORONAVIRUS 2 BY RT PCR (HOSPITAL ORDER, PERFORMED IN ~~LOC~~ HOSPITAL LAB): SARS Coronavirus 2: NEGATIVE

## 2020-11-26 IMAGING — DX DG CHEST 1V PORT
1 series · 1 of 1 positions shown · non-contrast
Comparison: Chest radiograph [DATE].

CLINICAL DATA: Cough and shortness of breath.

EXAM:
PORTABLE CHEST 1 VIEW

[chest ap]
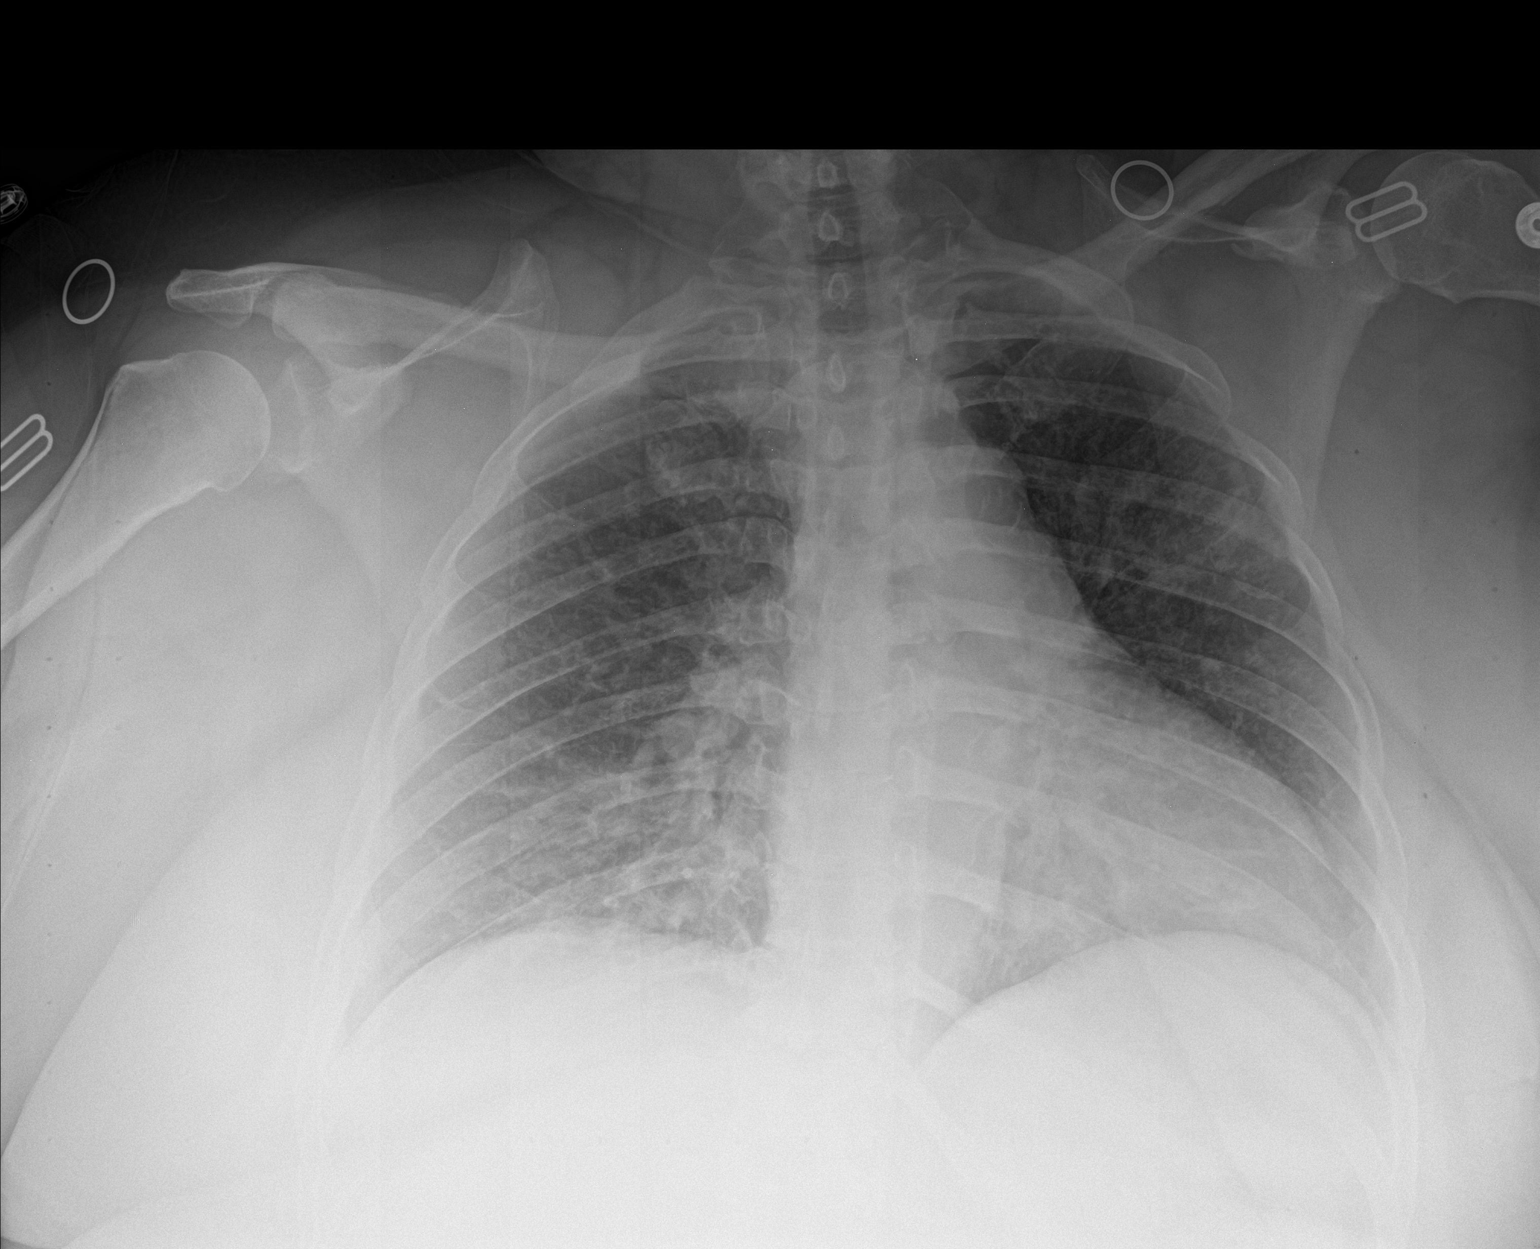

[1 of 1 positions shown; findings below may reference images not displayed]

FINDINGS: Stable cardiac and mediastinal contours. Similar bibasilar
heterogeneous opacities. Interval development of patchy
consolidation left upper lung. No pleural effusion or pneumothorax.
IMPRESSION: Interval development of patchy consolidation left upper lung which
may represent pneumonia in the appropriate clinical setting.

Bibasilar opacities favored to represent atelectasis.

## 2020-11-26 MED ORDER — AMPICILLIN-SULBACTAM SODIUM 3 (2-1) G IJ SOLR
3.0000 g | Freq: Four times a day (QID) | INTRAMUSCULAR | Status: DC
  Administered 2020-11-26 – 2020-11-30 (×16): 3 g via INTRAVENOUS
  Filled 2020-11-26: qty 3
  Filled 2020-11-26: qty 8
  Filled 2020-11-26 (×2): qty 3
  Filled 2020-11-26: qty 8
  Filled 2020-11-26 (×2): qty 3
  Filled 2020-11-26 (×2): qty 8
  Filled 2020-11-26 (×2): qty 3
  Filled 2020-11-26 (×2): qty 8
  Filled 2020-11-26: qty 3
  Filled 2020-11-26: qty 8
  Filled 2020-11-26 (×3): qty 3
  Filled 2020-11-26: qty 8

## 2020-11-26 NOTE — Plan of Care (Signed)
Results for COVID in chart. Chest x-ray completed. Educated to continue to cough and deep breath. ICS and flutter valve at bedside.  Problem: Education: Goal: Knowledge of General Education information will improve Description: Including pain rating scale, medication(s)/side effects and non-pharmacologic comfort measures Outcome: Progressing   Problem: Health Behavior/Discharge Planning: Goal: Ability to manage health-related needs will improve Outcome: Progressing   Problem: Clinical Measurements: Goal: Ability to maintain clinical measurements within normal limits will improve Outcome: Progressing Goal: Will remain free from infection Outcome: Progressing Goal: Diagnostic test results will improve Outcome: Progressing Goal: Respiratory complications will improve Outcome: Progressing Goal: Cardiovascular complication will be avoided Outcome: Progressing   Problem: Activity: Goal: Risk for activity intolerance will decrease Outcome: Progressing   Problem: Nutrition: Goal: Adequate nutrition will be maintained Outcome: Progressing   Problem: Coping: Goal: Level of anxiety will decrease Outcome: Progressing   Problem: Elimination: Goal: Will not experience complications related to bowel motility Outcome: Progressing Goal: Will not experience complications related to urinary retention Outcome: Progressing   Problem: Pain Managment: Goal: General experience of comfort will improve Outcome: Progressing   Problem: Safety: Goal: Ability to remain free from injury will improve Outcome: Progressing   Problem: Skin Integrity: Goal: Risk for impaired skin integrity will decrease Outcome: Progressing   Problem: Ischemic Stroke/TIA Tissue Perfusion: Goal: Complications of ischemic stroke/TIA will be minimized Outcome: Progressing

## 2020-11-26 NOTE — Progress Notes (Addendum)
Pharmacy Antibiotic Note  Cathy Hall Cathy Hall is a 50 y.o. female admitted on 11/10/2020 with aspiration pneumonia.  Pharmacy has been consulted for Unasyn dosing.  1/22: patient with worsening cough and congestion.  CXR concerning for new infiltrate.   Plan: Unasyn 3g IV q6hours   Height: 5\' 1"  (154.9 cm) Weight: 97.3 kg (214 lb 8.1 oz) IBW/kg (Calculated) : 47.8  Temp (24hrs), Avg:98.6 F (37 C), Min:97.8 F (36.6 C), Max:99.1 F (37.3 C)  Recent Labs  Lab 11/24/20 0813 11/25/20 0430  CREATININE 0.77 0.71    Estimated Creatinine Clearance: 91.8 mL/min (by C-G formula based on SCr of 0.71 mg/dL).    Allergies  Allergen Reactions  . Nsaids Other (See Comments)    Other reaction(s): Kidney Disorder    Antimicrobials this admission: Unasyn 1/22 >>    Thank you for allowing pharmacy to be a part of this patient's care.  2/22, PharmD PGY-1 Acute Care Pharmacy Resident Office: [phone] 11/26/2020 1:47 PM

## 2020-11-26 NOTE — Progress Notes (Signed)
Progress Note    Jahnia Hewes  [id number] DOB: [birth year]  DOA: 11/10/2020 PCP: Patient, No Pcp Per    Brief Narrative:   Chief complaint: Worsening dysarthria s/p recent stroke  Medical records reviewed and are as summarized below:  Keyuana Wank is an 50 y.o. female with history of COPD, cocaine abuse, hypertension just discharged today earlier after being admitted for stroke with some left-sided weakness while sitting at home experienced worsening dysarthria and also difficulty managing her secretions.  Patient was brought in as a code stroke. -Stroke work-up completed has been awaiting SNF for rehab -1/22 with worsening cough congestion and low-grade fever  Assessment/Plan:   Principal Problem:   Acute CVA (cerebrovascular accident) (HCC) associated with dysphagia, dysarthria and mild left hemiparesis -Neurology following, repeat MRI shows progression of thalamic stroke and new areas of embolic stroke. -Followed by speech therapy, diet was gradually advanced to regular -Continue aspirin Plavix and statin -H/O cocaine abuse. 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 -Now awaiting SNF for rehabilitation  Cough congestion mild dyspnea, low-grade fever -Chest x-ray concerning for new infiltrate and basilar atelectasis -Wonder if she could be aspirating, COVID PCR is negative -Start IV Unasyn, continue nebs -SLP to reassess  Active Problems:   Essential hypertension -Stable, continue Norvasc    ARF (acute renal failure) (HCC) -Stable    Cocaine Abuse -Counseled    Obesity -Needs diet, lifestyle modification  Nutritional status        Body mass index is 40.53 kg/m.   Family Communication/Anticipated D/C date and plan/Code Status   DVT prophylaxis: enoxaparin (LOVENOX) injection 40 mg Start: 11/11/20 1000   Code Status: Full Code.  Family Communication: No family at the  bedside. Disposition Plan: Status is: Inpatient  Remains inpatient appropriate because: Awaiting SNF, now concern for aspiration   Dispo: The patient is from: Home              Anticipated d/c is to: SNF              Anticipated d/c date is: Unknown              Patient currently is not medically stable to d/c.   Medical Consultants:    Neurology   Anti-Infectives:    None  Subjective:   -Complains of cough and congestion today  Objective:    Vitals:   11/26/20 0442 11/26/20 0824 11/26/20 0846 11/26/20 1200  BP: (!) 140/112 (!) 86/49 131/73 (!) 132/91  Pulse: (!) 106 100 (!) 110 (!) 105  Resp: 18 18 18 16   Temp: 99.1 F (37.3 C) 99.1 F (37.3 C)  98.7 F (37.1 C)  TempSrc: Oral Oral  Oral  SpO2: 92% 94% 97% 97%  Weight:      Height:        Intake/Output Summary (Last 24 hours) at 11/26/2020 1339 Last data filed at 11/25/2020 1900 Gross per 24 hour  Intake 840 ml  Output 976 ml  Net -136 ml   Exam: General: Obese pleasant female awake alert oriented x3, no distress CVS: S1-S2, regular rate rhythm Lungs: Few scattered rhonchi otherwise clear Abdomen: Soft, nontender, bowel sounds present Extremities: No edema Neuro: Left facial droop, mild dysarthria, mild left hemiplegia -known   Data Reviewed:   I have personally reviewed following labs and imaging studies:  Labs: Labs show the following:   Basic Metabolic Panel: Recent Labs  Lab 11/24/20 0813 11/25/20 0430  NA 139  138  K 4.4 4.9  CL 101 99  CO2 25 24  GLUCOSE 136* 124*  BUN 15 16  CREATININE 0.77 0.71  CALCIUM 8.7* 8.9   GFR Estimated Creatinine Clearance: 91.8 mL/min (by C-G formula based on SCr of 0.71 mg/dL). Liver Function Tests: No results for input(s): AST, ALT, ALKPHOS, BILITOT, PROT, ALBUMIN in the last 168 hours. No results for input(s): LIPASE, AMYLASE in the last 168 hours. Coagulation profile No results for input(s): INR, PROTIME in the last 168 hours.  CBC: No  results for input(s): WBC, NEUTROABS, HGB, HCT, MCV, PLT in the last 168 hours. CBG: Recent Labs  Lab 11/25/20 0600 11/25/20 1207 11/25/20 1757 11/26/20 0615 11/26/20 1201  GLUCAP 136* 203* 104* 130* 183*    Microbiology Recent Results (from the past 240 hour(s))  SARS Coronavirus 2 by RT PCR (hospital order, performed in [facility] hospital lab) Nasopharyngeal Nasopharyngeal Swab     Status: None   Collection Time: 11/26/20  9:13 AM   Specimen: Nasopharyngeal Swab  Result Value Ref Range Status   SARS Coronavirus 2 NEGATIVE NEGATIVE Final    Comment: (NOTE) SARS-CoV-2 target nucleic acids are NOT DETECTED.  The SARS-CoV-2 RNA is generally detectable in upper and lower respiratory specimens during the acute phase of infection. The lowest concentration of SARS-CoV-2 viral copies this assay can detect is 250 copies / mL. A negative result does not preclude SARS-CoV-2 infection and should not be used as the sole basis for treatment or other patient management decisions.  A negative result may occur with improper specimen collection / handling, submission of specimen other than nasopharyngeal swab, presence of viral mutation(s) within the areas targeted by this assay, and inadequate number of viral copies (<250 copies / mL). A negative result must be combined with clinical observations, patient history, and epidemiological information.  Fact Sheet for Patients:   [employer]cy  Fact Sheet for Healthcare Providers: https://pope.com/  This test is not yet approved or  cleared by the [place] FDA and has been authorized for detection and/or diagnosis of SARS-CoV-2 by FDA under an Emergency Use Authorization (EUA).  This EUA will remain in effect (meaning this test can be used) for the duration of the COVID-19 declaration under Section 564(b)(1) of the Act, 21 U.S.C. section 360bbb-3(b)(1), unless the authorization is  terminated or revoked sooner.  Performed at [facility], [address], [place], [place] [address]     Procedures and diagnostic studies:  DG Chest 2 View  Result Date: 11/24/2020 CLINICAL DATA:  Short of breath EXAM: CHEST - 2 VIEW COMPARISON:  11/16/2020 FINDINGS: Heart size upper normal. Vascularity normal. Mild right lower lobe atelectasis. Negative for infiltrate or effusion. IMPRESSION: Mild right lower lobe atelectasis. Electronically Signed   By: Marlan Palau M.D.   On: 11/24/2020 18:18   DG CHEST PORT 1 VIEW  Result Date: 11/26/2020 CLINICAL DATA:  Cough and shortness of breath. EXAM: PORTABLE CHEST 1 VIEW COMPARISON:  Chest radiograph 12/02/2020. FINDINGS: Stable cardiac and mediastinal contours. Similar bibasilar heterogeneous opacities. Interval development of patchy consolidation left upper lung. No pleural effusion or pneumothorax. IMPRESSION: Interval development of patchy consolidation left upper lung which may represent pneumonia in the appropriate clinical setting. Bibasilar opacities favored to represent atelectasis. Electronically Signed   By: Annia Belt M.D.   On: 11/26/2020 13:13    Medications:   . amLODipine  5 mg Oral Daily  . aspirin  300 mg Rectal Daily   Or  .  aspirin  325 mg Oral Daily  . atorvastatin  40 mg Oral Daily  . clopidogrel  75 mg Oral Daily  . diclofenac Sodium  1 application Topical QID  . enoxaparin (LOVENOX) injection  40 mg Subcutaneous Q24H  . FLUoxetine  10 mg Oral Daily  . fluticasone  2 spray Each Nare Daily  . gabapentin  300 mg Oral TID  . loratadine  10 mg Oral Daily  . mometasone-formoterol  2 puff Inhalation BID  . montelukast  10 mg Oral QHS  . multivitamin with minerals  1 tablet Oral Daily  . nystatin  5 mL Oral QID  . OLANZapine  5 mg Oral QHS  . pantoprazole  40 mg Oral Daily  . umeclidinium bromide  1 puff Inhalation Daily   Continuous Infusions:   LOS: 15 days   Zannie Cove, MD  [facility]   [facility] 11/26/2020, 1:39 PM

## 2020-11-27 LAB — BASIC METABOLIC PANEL
Anion gap: 10 (ref 5–15)
BUN: 18 mg/dL (ref 6–20)
CO2: 29 mmol/L (ref 22–32)
Calcium: 9.2 mg/dL (ref 8.9–10.3)
Chloride: 97 mmol/L — ABNORMAL LOW (ref 98–111)
Creatinine, Ser: 0.93 mg/dL (ref 0.44–1.00)
GFR, Estimated: >60 mL/min (ref >60)
Glucose, Bld: 174 mg/dL — ABNORMAL HIGH (ref 70–99)
Potassium: 4.2 mmol/L (ref 3.5–5.1)
Sodium: 136 mmol/L (ref 135–145)

## 2020-11-27 LAB — CBC
HCT: 37.4 % (ref 36.0–46.0)
Hemoglobin: 12.1 g/dL (ref 12.0–15.0)
MCH: 30.7 pg (ref 26.0–34.0)
MCHC: 32.4 g/dL (ref 30.0–36.0)
MCV: 94.9 fL (ref 80.0–100.0)
Platelets: 230 10*3/uL (ref 150–400)
RBC: 3.94 MIL/uL (ref 3.87–5.11)
RDW: 12.9 % (ref 11.5–15.5)
WBC: 9 10*3/uL (ref 4.0–10.5)
nRBC: 0 % (ref 0.0–0.2)

## 2020-11-27 LAB — GLUCOSE, CAPILLARY
Glucose-Capillary: 140 mg/dL — ABNORMAL HIGH (ref 70–99)
Glucose-Capillary: 144 mg/dL — ABNORMAL HIGH (ref 70–99)
Glucose-Capillary: 159 mg/dL — ABNORMAL HIGH (ref 70–99)
Glucose-Capillary: 191 mg/dL — ABNORMAL HIGH (ref 70–99)

## 2020-11-27 NOTE — Progress Notes (Signed)
  Pt repeatedly asked for snacks and drinks and RN educated on her eating less sugar to keep blood glucose stable, and not eating while laying in bed to reduce risk of food coming back up while sleeping.

## 2020-11-27 NOTE — Plan of Care (Signed)

## 2020-11-27 NOTE — Progress Notes (Signed)
Progress Note    Cathy Hall  [id number]:[id number] DOB: 1971-09-10  DOA: 11/10/2020 PCP: Patient, No Pcp Per    Brief Narrative:   Chief complaint: Worsening dysarthria s/p recent stroke  Medical records reviewed and are as summarized below:  Cathy Hall is an 50 y.o. female with history of COPD, cocaine abuse, hypertension just discharged today earlier after being admitted for stroke with some left-sided weakness while sitting at home experienced worsening dysarthria and also difficulty managing her secretions.  Patient was brought in as a code stroke. -Stroke work-up completed has been awaiting SNF for rehab -1/22 with worsening cough congestion and low-grade fever  Assessment/Plan:   Principal Problem:   Acute CVA (cerebrovascular accident) (HCC) associated with dysphagia, dysarthria and mild left hemiparesis -Neurology following, repeat MRI noted progression of thalamic stroke and new areas of embolic stroke. -Followed by speech therapy, diet was gradually advanced to regular -Continue aspirin Plavix and statin -H/O cocaine abuse. 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 -Now awaiting SNF for rehabilitation  Cough congestion mild dyspnea, low-grade fever 1/22 -Chest x-ray concerning for new infiltrate and basilar atelectasis -Could be secondary to aspiration, COVID PCR is negative -Day 2 of IV Unasyn, changed to oral Augmentin tomorrow to complete 5 to 7-day course, continue nebs -SLP to reassess    Essential hypertension -Stable, continue Norvasc    ARF (acute renal failure) (HCC) -Stable    Cocaine Abuse -Counseled    Obesity -BMI 45.3, needs diet, lifestyle modification  Family Communication/Anticipated D/C date and plan/Code Status   DVT prophylaxis: enoxaparin (LOVENOX) injection 40 mg Start: 11/11/20 1000 Code Status: Full Code.  Family Communication: No family at the  bedside. Disposition Plan: Status is: Inpatient  Remains inpatient appropriate because: Awaiting SNF, now concern for aspiration   Dispo: The patient is from: Home              Anticipated d/c is to: SNF              Anticipated d/c date is: Unknown              Patient currently is not medically stable to d/c.   Medical Consultants:    Neurology   Anti-Infectives:    None  Subjective:   -Feels a little better, denies any dyspnea, cough is present but not as bad as yesterday  Objective:    Vitals:   11/27/20 0000 11/27/20 0033 11/27/20 0811 11/27/20 0816  BP:  125/75 106/64   Pulse:  84 84 95  Resp:  20 19 18   Temp:  98.2 F (36.8 C) 98 F (36.7 C)   TempSrc:  Oral Oral   SpO2:  99% 93% 95%  Weight: 108.9 kg     Height:        Intake/Output Summary (Last 24 hours) at 11/27/2020 1151 Last data filed at 11/27/2020 0030 Gross per 24 hour  Intake 240 ml  Output -  Net 240 ml   Exam: General: Obese pleasant female sitting up in bed, coughing, AOx3, no distress HEENT: Left facial droop CVS: S1-S2, regular rate rhythm Lungs: Few scattered rhonchi, no wheezing Abdomen: Soft, nontender, bowel sounds present Remedies: No edema Neuro: Left facial droop, mild dysarthria, mild left hemiplegia -known   Data Reviewed:   I have personally reviewed following labs and imaging studies:  Labs: Labs show the following:   Basic Metabolic Panel: Recent Labs  Lab 11/24/20 0813 11/25/20 0430 11/27/20 0124  NA 139 138 136  K 4.4 4.9 4.2  CL 101 99 97*  CO2 25 24 29   GLUCOSE 136* 124* 174*  BUN 15 16 18   CREATININE 0.77 0.71 0.93  CALCIUM 8.7* 8.9 9.2   GFR Estimated Creatinine Clearance: 84.3 mL/min (by C-G formula based on SCr of 0.93 mg/dL). Liver Function Tests: No results for input(s): AST, ALT, ALKPHOS, BILITOT, PROT, ALBUMIN in the last 168 hours. No results for input(s): LIPASE, AMYLASE in the last 168 hours. Coagulation profile No results for  input(s): INR, PROTIME in the last 168 hours.  CBC: Recent Labs  Lab 11/27/20 0124  WBC 9.0  HGB 12.1  HCT 37.4  MCV 94.9  PLT 230   CBG: Recent Labs  Lab 11/26/20 0615 11/26/20 1201 11/26/20 1848 11/27/20 0026 11/27/20 0632  GLUCAP 130* 183* 173* 144* 159*    Microbiology Recent Results (from the past 240 hour(s))  SARS Coronavirus 2 by RT PCR (hospital order, performed in [facility] hospital lab) Nasopharyngeal Nasopharyngeal Swab     Status: None   Collection Time: 11/26/20  9:13 AM   Specimen: Nasopharyngeal Swab  Result Value Ref Range Status   SARS Coronavirus 2 NEGATIVE NEGATIVE Final    Comment: (NOTE) SARS-CoV-2 target nucleic acids are NOT DETECTED.  The SARS-CoV-2 RNA is generally detectable in upper and lower respiratory specimens during the acute phase of infection. The lowest concentration of SARS-CoV-2 viral copies this assay can detect is 250 copies / mL. A negative result does not preclude SARS-CoV-2 infection and should not be used as the sole basis for treatment or other patient management decisions.  A negative result may occur with improper specimen collection / handling, submission of specimen other than nasopharyngeal swab, presence of viral mutation(s) within the areas targeted by this assay, and inadequate number of viral copies (<250 copies / mL). A negative result must be combined with clinical observations, patient history, and epidemiological information.  Fact Sheet for Patients:   [facility]  Fact Sheet for Healthcare Providers: 11/28/20  This test is not yet approved or  cleared by the [employer] FDA and has been authorized for detection and/or diagnosis of SARS-CoV-2 by FDA under an Emergency Use Authorization (EUA).  This EUA will remain in effect (meaning this test can be used) for the duration of the COVID-19 declaration under Section 564(b)(1) of the Act, 21  U.S.C. section 360bbb-3(b)(1), unless the authorization is terminated or revoked sooner.  Performed at [facility], [address] [place], [address] [place]     Procedures and diagnostic studies:  DG CHEST PORT 1 VIEW  Result Date: 11/26/2020 CLINICAL DATA:  Cough and shortness of breath. EXAM: PORTABLE CHEST 1 VIEW COMPARISON:  Chest radiograph 12/02/2020. FINDINGS: Stable cardiac and mediastinal contours. Similar bibasilar heterogeneous opacities. Interval development of patchy consolidation left upper lung. No pleural effusion or pneumothorax. IMPRESSION: Interval development of patchy consolidation left upper lung which may represent pneumonia in the appropriate clinical setting. Bibasilar opacities favored to represent atelectasis. Electronically Signed   By: 11/28/2020 M.D.   On: 11/26/2020 13:13    Medications:   . amLODipine  5 mg Oral Daily  . aspirin  300 mg Rectal Daily   Or  . aspirin  325 mg Oral Daily  . atorvastatin  40 mg Oral Daily  . clopidogrel  75 mg Oral Daily  . diclofenac Sodium  1 application Topical QID  . enoxaparin (LOVENOX) injection  40 mg Subcutaneous Q24H  .  FLUoxetine  10 mg Oral Daily  . fluticasone  2 spray Each Nare Daily  . gabapentin  300 mg Oral TID  . loratadine  10 mg Oral Daily  . mometasone-formoterol  2 puff Inhalation BID  . montelukast  10 mg Oral QHS  . multivitamin with minerals  1 tablet Oral Daily  . nystatin  5 mL Oral QID  . OLANZapine  5 mg Oral QHS  . pantoprazole  40 mg Oral Daily  . umeclidinium bromide  1 puff Inhalation Daily   Continuous Infusions: . ampicillin-sulbactam (UNASYN) IV 3 g (11/27/20 0914)     LOS: 16 days   Zannie Cove, MD  [facility]   [facility] 11/27/2020, 11:51 AM

## 2020-11-27 NOTE — Evaluation (Signed)
Clinical/Bedside Swallow Evaluation Patient Details  Name: Cathy Hall MRN: [id number] Date of Birth: 05-Feb-1971  Today's Date: 11/27/2020 Time: SLP Start Time (ACUTE ONLY): 1502 SLP Stop Time (ACUTE ONLY): 1515 SLP Time Calculation (min) (ACUTE ONLY): 13 min  Past Medical History:  Past Medical History:  Diagnosis Date  . Anxiety   . Asthma   . COPD (chronic obstructive pulmonary disease) (HCC)   . Depression   . Hypertension   . Major depression   . PTSD (post-traumatic stress disorder)    Past Surgical History:  Past Surgical History:  Procedure Laterality Date  . ECTOPIC PREGNANCY SURGERY     HPI:  Cathy Hall is a 50 y.o. female with history of COPD cocaine abuse hypertension just discharged today earlier after being admitted for stroke with some left-sided weakness while sitting at home experienced worsening dysarthria and also difficulty managing her secretions.   CT shows A known small subacute infarct within the posterior left midbrain from prior admission on 1/3, but there is concern for an new insult as well. Repeat MRI brain was attempted but due to worsening secretion and was unable to complete it. MBS 1/7:  moderate to severe dysphagia with decreased sensation of aspiration at inconsistent swallow response. A dysphagia 1 diet with nectar thick liquids was recommended at that time, but pt was subsequently advanced to regular texture solids and thin liquids. CXR 1/23: Interval development of patchy consolidation left upper lung which  may represent pneumonia in the appropriate clinical setting. Bibasilar opacities favored to represent atelectasis. SLP consulted for swallowing due to "concern for ongoing aspiration".   Assessment / Plan / Recommendation Clinical Impression  Pt was seen for bedside swallow re-evaluation. Pt reported that she inconsistently coughs with foods that are dry and that such foods "get caught". Pt identified the  approximate level of the valleculae as the site of the sensation and stated that she has been compensating for this by eating slowly. She demonstrated coughing with 1/7 boluses of regular texture solids, suggesting possible aspiration. Pt was otherwise asymptomatic of oropharyngeal dysphagia.  Intake rate was relatively quick despite pt's reports and the impact of this on her performance is considered. It is recommended that the pt's current diet be continued with intermittent supervision to facilitate observance of swallowing precautions. SLP will follow to assess diet tolerance and to determine the potential impact of behavior on aspiration risk. SLP Visit Diagnosis: Dysphagia, unspecified (R13.10)    Aspiration Risk  Mild aspiration risk    Diet Recommendation Regular;Thin liquid   Liquid Administration via: Cup;Straw Medication Administration: Whole meds with puree Supervision: Patient able to self feed;Intermittent supervision to cue for compensatory strategies Compensations: Slow rate;Small sips/bites;Minimize environmental distractions Postural Changes: Seated upright at 90 degrees    Other  Recommendations Oral Care Recommendations: Oral care BID   Follow up Recommendations Skilled Nursing facility      Frequency and Duration min 2x/week  1 week       Prognosis Prognosis for Safe Diet Advancement: Good      Swallow Study   General Date of Onset: 11/05/20 HPI: Cathy Hall is a 50 y.o. female with history of COPD cocaine abuse hypertension just discharged today earlier after being admitted for stroke with some left-sided weakness while sitting at home experienced worsening dysarthria and also difficulty managing her secretions.   CT shows A known small subacute infarct within the posterior left midbrain from prior admission on 1/3, but there is concern for an new  insult as well. Repeat MRI brain was attempted but due to worsening secretion and was unable to  complete it. MBS 1/7:  moderate to severe dysphagia with decreased sensation of aspiration at inconsistent swallow response. A dysphagia 1 diet with nectar thick liquids was recommended at that time, but pt was subsequently advanced to regular texture solids and thin liquids. CXR 1/23: Interval development of patchy consolidation left upper lung which  may represent pneumonia in the appropriate clinical setting. Bibasilar opacities favored to represent atelectasis. SLP consulted for swallowing due to "concern for ongoing aspiration". Type of Study: Bedside Swallow Evaluation Previous Swallow Assessment: See HPI Diet Prior to this Study: Regular;Thin liquids Temperature Spikes Noted: No Respiratory Status: Nasal cannula (removed by pt) History of Recent Intubation: No Behavior/Cognition: Alert;Cooperative;Pleasant mood Oral Cavity Assessment: Within Functional Limits Oral Care Completed by SLP: No Oral Cavity - Dentition: Missing dentition Vision: Functional for self-feeding Self-Feeding Abilities: Able to feed self Patient Positioning: Upright in bed;Postural control adequate for testing Baseline Vocal Quality: Normal Volitional Cough: Strong Volitional Swallow: Able to elicit    Oral/Motor/Sensory Function Overall Oral Motor/Sensory Function: Mild impairment Facial ROM: Reduced left;Suspected CN VII (facial) dysfunction Facial Symmetry: Abnormal symmetry left;Suspected CN VII (facial) dysfunction Facial Strength: Reduced left Facial Sensation: Within Functional Limits Lingual ROM: Within Functional Limits Lingual Symmetry: Within Functional Limits Lingual Strength: Reduced;Suspected CN XII (hypoglossal) dysfunction Lingual Sensation: Within Functional Limits Velum: Within Functional Limits   Ice Chips Ice chips: Within functional limits Presentation: Spoon   Thin Liquid Thin Liquid: Within functional limits Presentation: Straw    Nectar Thick Nectar Thick Liquid: Not tested   Honey  Thick Honey Thick Liquid: Not tested   Puree Puree: Within functional limits Presentation: Spoon   Solid     Solid: Impaired Pharyngeal Phase Impairments: Cough - Immediate (With 1/7 boluses)     Cathy Hall I. Vear Clock, MS, CCC-SLP Acute Rehabilitation Services Office number [phone] Pager [phone]   Scheryl Marten 11/27/2020,3:41 PM

## 2020-11-28 ENCOUNTER — Inpatient Hospital Stay (HOSPITAL_COMMUNITY): Payer: Self-pay

## 2020-11-28 LAB — URINALYSIS, ROUTINE W REFLEX MICROSCOPIC
Bacteria, UA: NONE SEEN
Bilirubin Urine: NEGATIVE
Glucose, UA: NEGATIVE mg/dL
Ketones, ur: NEGATIVE mg/dL
Leukocytes,Ua: NEGATIVE
Nitrite: NEGATIVE
Protein, ur: NEGATIVE mg/dL
Specific Gravity, Urine: 1.013 (ref 1.005–1.030)
pH: 6 (ref 5.0–8.0)

## 2020-11-28 IMAGING — CT CT CHEST W/O CM
2 of 4 series · 15 of 36 positions shown, 18 images · non-contrast
Comparison: Chest radiograph [DATE]

CLINICAL DATA: Respiratory phase for, hypoxia, unexplained fever

EXAM:
CT CHEST WITHOUT CONTRAST
TECHNIQUE: Multidetector CT imaging of the chest was performed following the
standard protocol without IV contrast. Sagittal and coronal MPR
images reconstructed from axial data set.

[Series 3: chest wo · axial · 0.71mm/px · z∈[+1230,+1466]mm · 12 of 140 slices shown, 15 images]
[im 11/140  mediastinal]
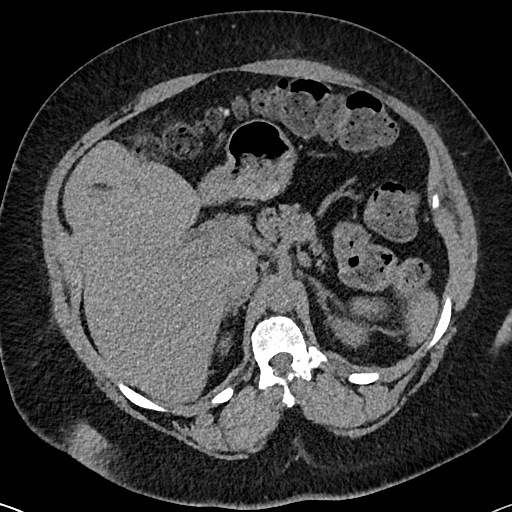
[im 11/140  lung]
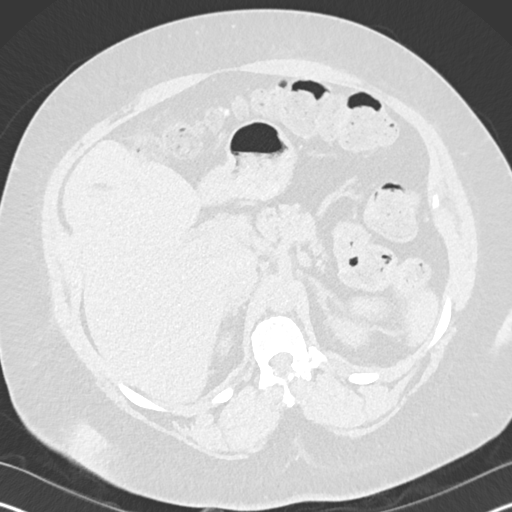
[im 22/140  lung]
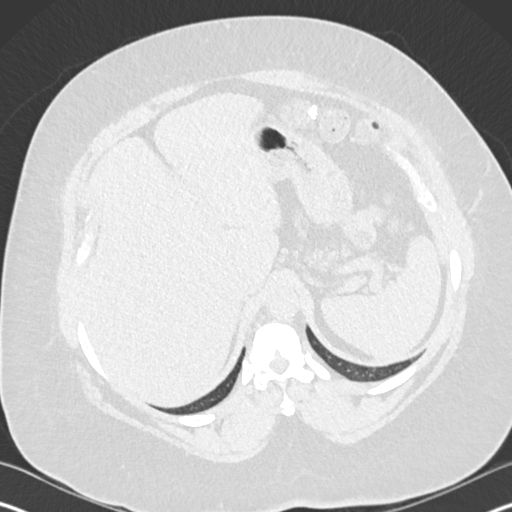
[im 33/140  lung]
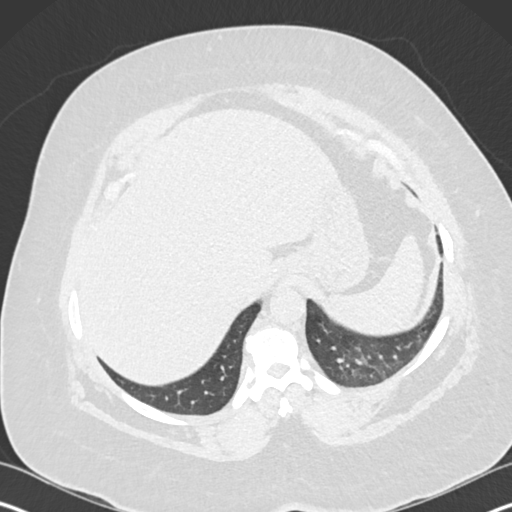
[im 43/140  lung]
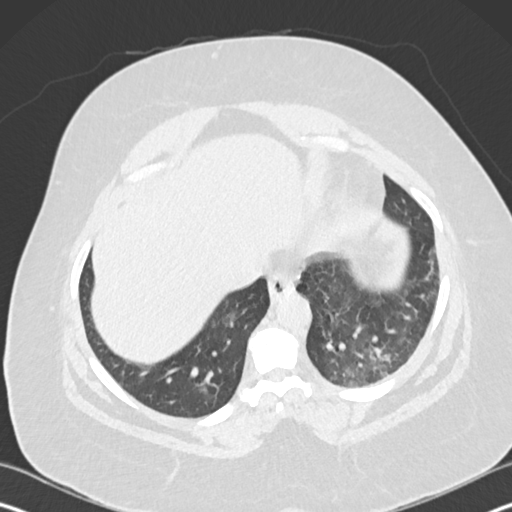
[im 54/140  mediastinal]
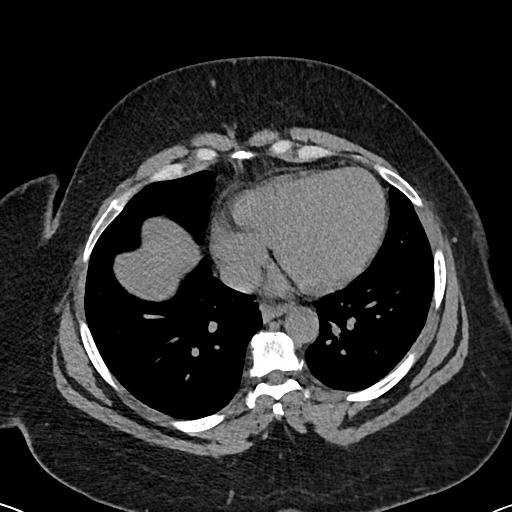
[im 54/140  lung]
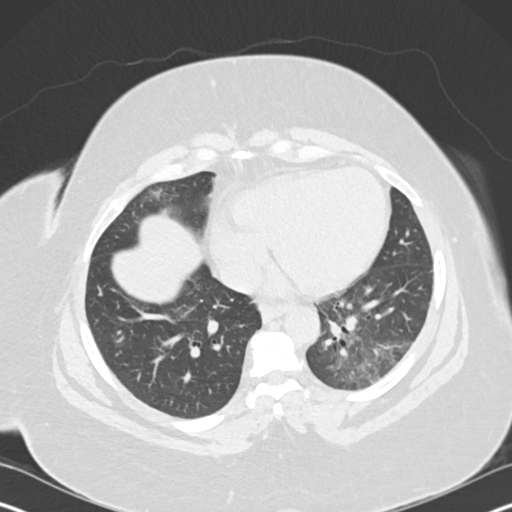
[im 65/140  lung]
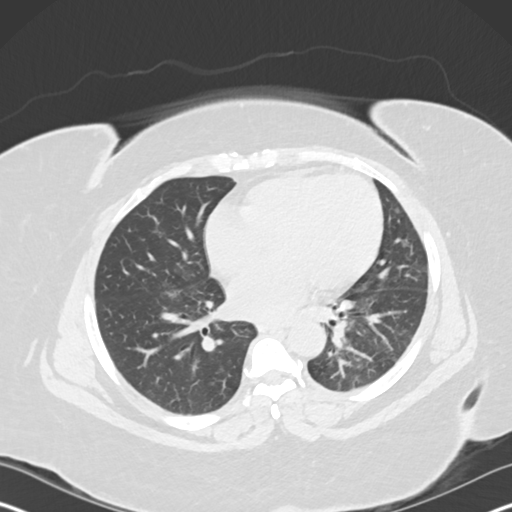
[im 75/140  lung]
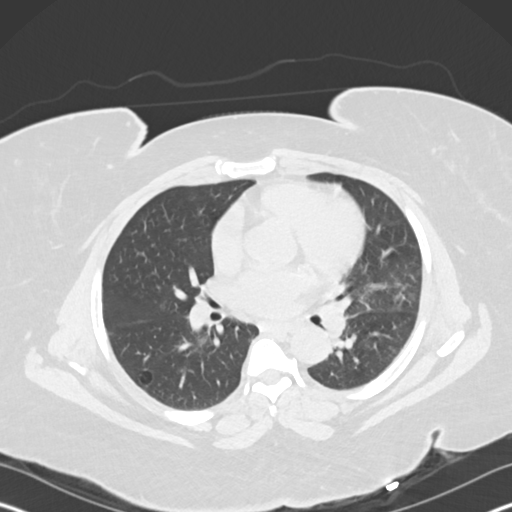
[im 86/140  lung]
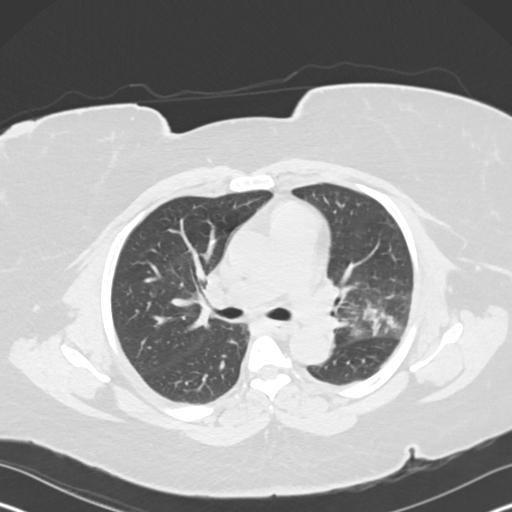
[im 97/140  mediastinal]
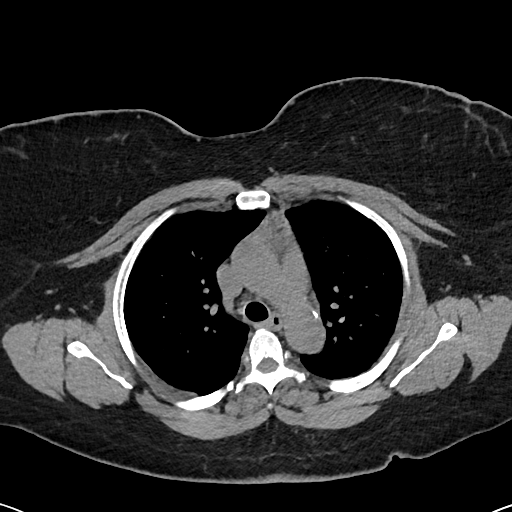
[im 97/140  lung]
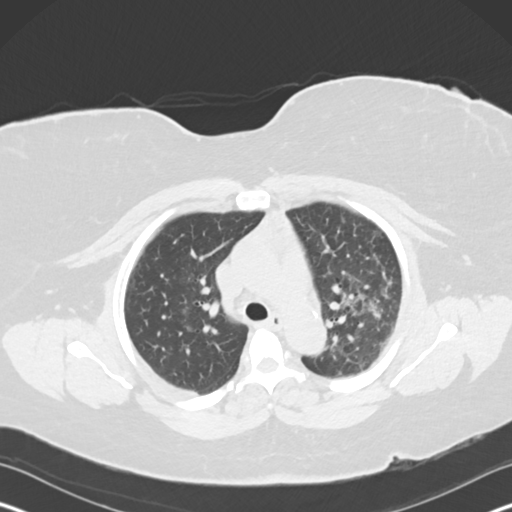
[im 107/140  lung]
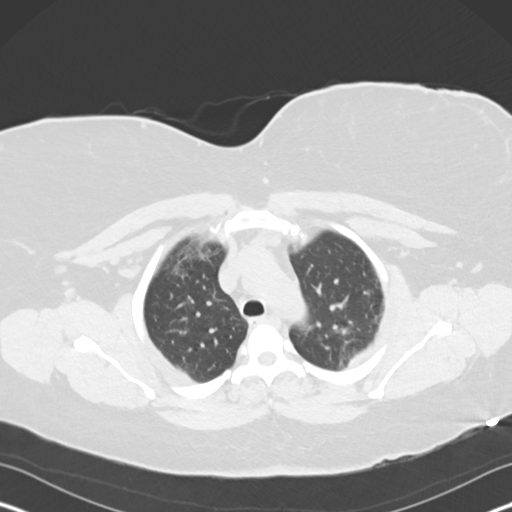
[im 118/140  lung]
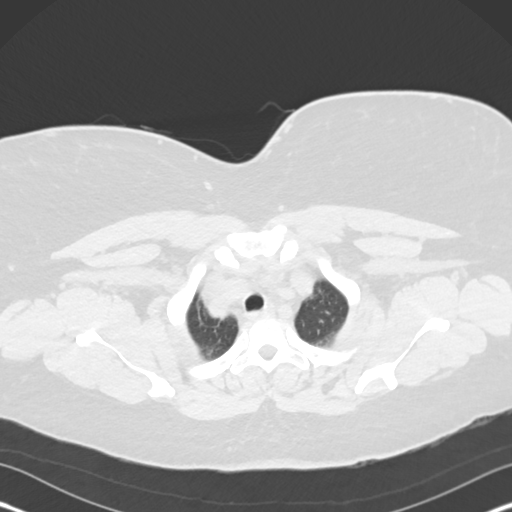
[im 129/140  lung]
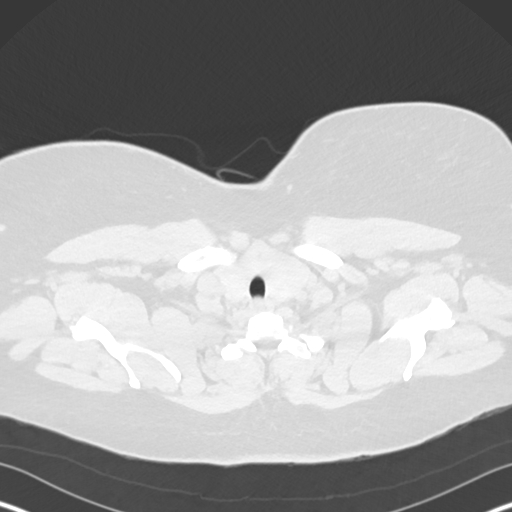

[Series 6: cor · coronal · 0.58mm/px · 3 of 187 slices shown]
[im 38/187  lung]
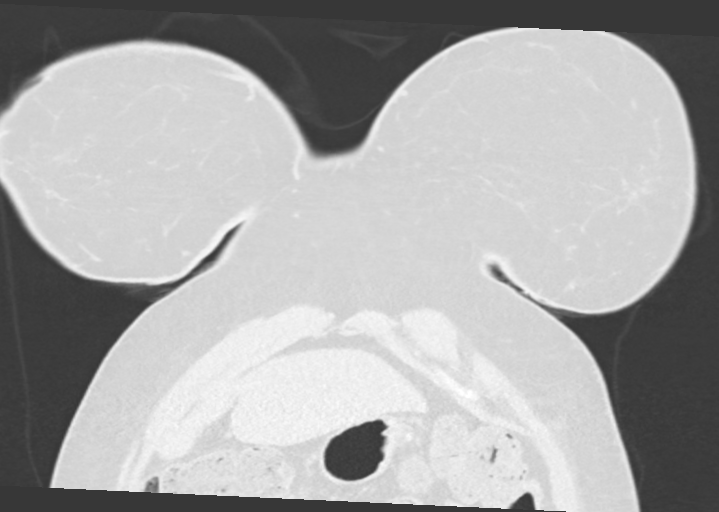
[im 75/187  lung]
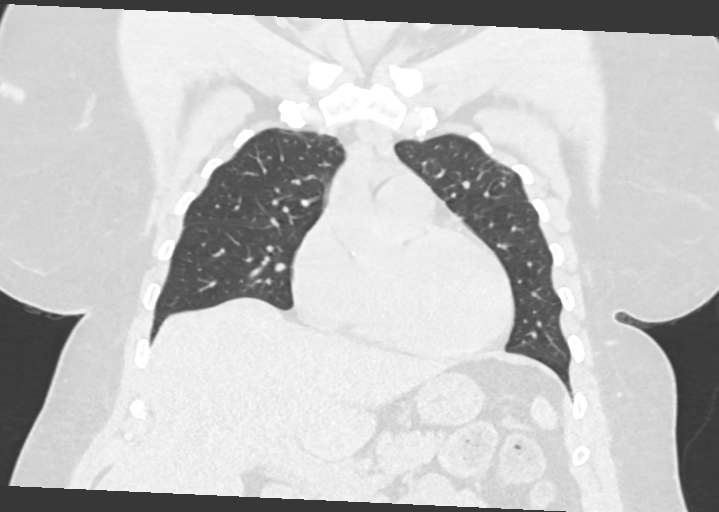
[im 112/187  lung]
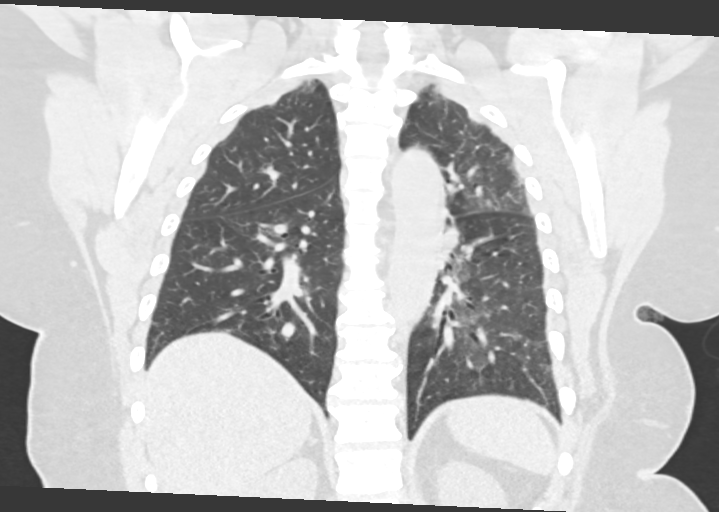

[15 of 36 positions shown; findings below may reference images not displayed]

FINDINGS: Cardiovascular: Heart upper normal size. Aorta normal caliber. No
pericardial effusion.

Mediastinum/Nodes: Esophagus unremarkable. No thoracic adenopathy.
Base of cervical region normal appearance. Residual thymic tissue in
anterior mediastinum.

Lungs/Pleura: Patchy infiltrates in LEFT upper and LEFT lower lobes
consistent with pneumonia. Minimal infiltrate RIGHT upper lobe. No
pleural effusion or pneumothorax. No discrete pulmonary mass/nodule.

Upper Abdomen: Unremarkable

Musculoskeletal: Normal appearance
IMPRESSION: Patchy infiltrates in LEFT upper and LEFT lower lobes consistent
with pneumonia.

Minimal infiltrate RIGHT upper lobe.

## 2020-11-28 NOTE — Progress Notes (Signed)
Physical Therapy Treatment Patient Details Name: Cathy Hall MRN: [id number] DOB: [birth year] Today's Date: 11/28/2020    History of Present Illness Pt is a 50 y/o female admitted secondary to worsening L sided weakness, difficulty managing secretions and dysarthria. Thought to be secondary to extension of recent CVA. Recently discharged secondary to CVA. PMH inclues CVA, COPD, drug abuse, PTSD.    PT Comments    Patient progressing slowly towards PT goals. Reporting increased pain in Right knee and upset she has been waiting for pain medications for 1 hour, per report. Tolerated gait training with Min guard assist for balance/safety and use of RW. Per SW, pt likely will be discharging home due to no placement available. Initiated stair training today with Min guard assist and cues for technique. Pt reports she can has to negotiate 1 flight to enter home but then can stay on the first level with b/f, at least initially. Pt's shower is upstairs however.  Pt requesting w/c for longer distances as her surgery is not for another 4 weeks. Will continue to work on stair training in stairwell to attempt 1 flight next session as able. Will follow.   Follow Up Recommendations  Home health PT;Supervision for mobility/OOB     Equipment Recommendations  Wheelchair (measurements PT);Rolling walker with 5" wheels    Recommendations for Other Services       Precautions / Restrictions Precautions Precautions: Fall Required Braces or Orthoses: Other Brace Other Brace: Bledsoe brace on RLE Restrictions Weight Bearing Restrictions: Yes RLE Weight Bearing: Weight bearing as tolerated    Mobility  Bed Mobility Overal bed mobility: Modified Independent Bed Mobility: Supine to Sit     Supine to sit: Modified independent (Device/Increase time)     General bed mobility comments: No assist needed.  Transfers Overall transfer level: Needs assistance Equipment used: Rolling walker (2  wheeled) Transfers: Sit to/from Stand Sit to Stand: Supervision         General transfer comment: Supervision for safety. Stood from EOB x2, total A to adjust brace in sitting/standing as it was falling down with changes in position.  Ambulation/Gait Ambulation/Gait assistance: Min guard Gait Distance (Feet): 120 Feet Assistive device: Rolling walker (2 wheeled) Gait Pattern/deviations: Step-to pattern;Step-through pattern;Decreased stance time - right;Decreased step length - left Gait velocity: reduced Gait velocity interpretation: <1.31 ft/sec, indicative of household ambulator General Gait Details: Slow, mildly unsteady gait with decreased stance time RLE due to pain. 2 standing rest breaks.   Stairs Stairs: Yes Stairs assistance: Min guard Stair Management: One rail Left;Step to pattern Number of Stairs: 3 (+2 x2 bouts) General stair comments: Cues for safety/technique.   Wheelchair Mobility    Modified Rankin (Stroke Patients Only) Modified Rankin (Stroke Patients Only) Pre-Morbid Rankin Score: Slight disability Modified Rankin: Moderate disability     Balance Overall balance assessment: Needs assistance Sitting-balance support: Feet supported;No upper extremity supported Sitting balance-Leahy Scale: Good Sitting balance - Comments: Assist to adjust bledsoe brace   Standing balance support: During functional activity Standing balance-Leahy Scale: Fair Standing balance comment: Does better with UE support in standing esp due to pain.                            Cognition Arousal/Alertness: Awake/alert Behavior During Therapy: WFL for tasks assessed/performed Overall Cognitive Status: Impaired/Different from baseline Area of Impairment: Awareness  Awareness: Anticipatory   General Comments: Patient with decreased problem solving and decreased insight into cognitive deficits. Upset about still being in the  hospital      Exercises      General Comments General comments (skin integrity, edema, etc.): Continued to encourage mobility due to diagnosis of PNA and pt reporting coughing up dark phelgm.      Pertinent Vitals/Pain Pain Assessment: Faces Faces Pain Scale: Hurts even more Pain Location: Rt knee Pain Descriptors / Indicators: Grimacing;Sore;Constant;Discomfort Pain Intervention(s): Monitored during session;Repositioned;Patient requesting pain meds-RN notified;Limited activity within patient's tolerance    Home Living                      Prior Function            PT Goals (current goals can now be found in the care plan section) Progress towards PT goals: Progressing toward goals    Frequency    Min 3X/week      PT Plan Discharge plan needs to be updated    Co-evaluation              AM-PAC PT "6 Clicks" Mobility   Outcome Measure  Help needed turning from your back to your side while in a flat bed without using bedrails?: None Help needed moving from lying on your back to sitting on the side of a flat bed without using bedrails?: None Help needed moving to and from a bed to a chair (including a wheelchair)?: A Little Help needed standing up from a chair using your arms (e.g., wheelchair or bedside chair)?: A Little Help needed to walk in hospital room?: A Little Help needed climbing 3-5 steps with a railing? : A Lot 6 Click Score: 19    End of Session Equipment Utilized During Treatment: Gait belt Activity Tolerance: Patient tolerated treatment well;Patient limited by pain Patient left: in bed;with call bell/phone within reach (eating lunch) Nurse Communication: Mobility status;Patient requests pain meds PT Visit Diagnosis: Unsteadiness on feet (R26.81);Muscle weakness (generalized) (M62.81);Other abnormalities of gait and mobility (R26.89)     Time: 1129-1150 PT Time Calculation (min) (ACUTE ONLY): 21 min  Charges:  $Gait Training: 8-22  mins                     Vale Haven, PT, DPT Acute Rehabilitation Services Pager [phone] Office [phone]       Blake Divine A Lanier Ensign 11/28/2020, 12:59 PM
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
## 2020-11-28 NOTE — Plan of Care (Signed)
  Problem: Education: Goal: Knowledge of General Education information will improve Description: Including pain rating scale, medication(s)/side effects and non-pharmacologic comfort measures Outcome: Progressing   Problem: Nutrition: Goal: Adequate nutrition will be maintained Outcome: Progressing   Problem: Coping: Goal: Level of anxiety will decrease Outcome: Progressing   Problem: Pain Managment: Goal: General experience of comfort will improve Outcome: Progressing   Problem: Ischemic Stroke/TIA Tissue Perfusion: Goal: Complications of ischemic stroke/TIA will be minimized Outcome: Progressing

## 2020-11-28 NOTE — Plan of Care (Signed)
  Problem: Education: Goal: Knowledge of General Education information will improve Description: Including pain rating scale, medication(s)/side effects and non-pharmacologic comfort measures Outcome: Progressing   Problem: Coping: Goal: Level of anxiety will decrease Outcome: Progressing   Problem: Elimination: Goal: Will not experience complications related to bowel motility Outcome: Progressing   Problem: Pain Managment: Goal: General experience of comfort will improve Outcome: Progressing   

## 2020-11-28 NOTE — Plan of Care (Signed)
  Problem: Ischemic Stroke/TIA Tissue Perfusion: Goal: Complications of ischemic stroke/TIA will be minimized Outcome: Progressing   Problem: Education: Goal: Knowledge of disease or condition will improve Outcome: Progressing Goal: Knowledge of secondary prevention will improve Outcome: Progressing Goal: Knowledge of patient specific risk factors addressed and post discharge goals established will improve Outcome: Progressing

## 2020-11-28 NOTE — TOC Progression Note (Signed)
Transition of Care [facility]) - Progression Note    Patient Details  Name: Cathy Hall MRN: [id number] Date of Birth: Mar 10, 1971  Transition of Care [facility]) CM/SW Steele, RN Phone Number: 11/28/2020, 12:41 PM  Clinical Narrative:    Case management met with the patient at the bedside regarding transitions of care.  The patient has currently not received any bed offers for [facility] placement at this time.  I messaged Martsnorne, PT and PT will start working with stair climbing as part of PT session - since patient may be discharged home with home health services once she is medically ready.  I called [facility] and patient is set up for hospital follow up on 12/05/2020 and is noted in the discharge instructions.  Patient developed a fever overnight and labs and xrays will follow per Erin Hearing, NP.  The patient states that her father in law is available at the home during the daytime hours so she will not be home alone if discharged home with home health services at a later day.  TOC Team will continue to follow for discharge needs, once medically able to discharge home with probable home health services.   Expected Discharge Plan: [place] Barriers to Discharge: No SNF bed,Continued Medical Work up,Inadequate or no insurance  Expected Discharge Plan and Services Expected Discharge Plan: [place] In-house Referral: Clinical Social Work,Financial [employer] / Psychologist, educational Discharge Planning Services: CM Consult Post Acute Care Choice: [place] Living arrangements for the past 2 months: Single Family Home Expected Discharge Date: 11/16/20                                     Social Determinants of Health (SDOH) Interventions    Readmission Risk Interventions Readmission Risk Prevention Plan 11/16/2020  Transportation Screening Complete  PCP or Specialist Appt within 3-5  Days Complete  HRI or [place] Complete  Social Work Consult for [place] Planning/Counseling Complete  Palliative Care Screening Complete  Medication Review Press photographer) Complete

## 2020-11-29 LAB — CBC
HCT: 40.6 % (ref 36.0–46.0)
Hemoglobin: 12.6 g/dL (ref 12.0–15.0)
MCH: 29.7 pg (ref 26.0–34.0)
MCHC: 31 g/dL (ref 30.0–36.0)
MCV: 95.8 fL (ref 80.0–100.0)
Platelets: 234 10*3/uL (ref 150–400)
RBC: 4.24 MIL/uL (ref 3.87–5.11)
RDW: 12.6 % (ref 11.5–15.5)
WBC: 8.1 10*3/uL (ref 4.0–10.5)
nRBC: 0 % (ref 0.0–0.2)

## 2020-11-29 LAB — URINE CULTURE
Culture: NO GROWTH
Special Requests: NORMAL

## 2020-11-29 NOTE — Progress Notes (Signed)
PT Cancellation Note  Patient Details Name: Cathy Hall MRN: [id number] DOB: 1971-03-20   Cancelled Treatment:    Reason Eval/Treat Not Completed: Patient at procedure or test/unavailable;Fatigue/lethargy limiting ability to participate Attempted to see pt x2 today, once pt working with OT and second time pt sleeping. Will follow.   Blake Divine A Alexander Aument 11/29/2020, 3:17 PM Vale Haven, PT, DPT Acute Rehabilitation Services Pager [phone] Office [phone]

## 2020-11-29 NOTE — Progress Notes (Signed)
CSW made referral to [facility] to request the patient receive charity OT and PT services.  Edwin Dada, MSW, LCSW-A Transitions of Care  Clinical Social Worker I [phone]

## 2020-11-29 NOTE — Plan of Care (Signed)
  Problem: Ischemic Stroke/TIA Tissue Perfusion: Goal: Complications of ischemic stroke/TIA will be minimized Outcome: Progressing   Problem: Skin Integrity: Goal: Risk for impaired skin integrity will decrease Outcome: Progressing   Problem: Education: Goal: Knowledge of disease or condition will improve Outcome: Progressing Goal: Knowledge of secondary prevention will improve Outcome: Progressing Goal: Knowledge of patient specific risk factors addressed and post discharge goals established will improve Outcome: Progressing   Problem: Pain Managment: Goal: General experience of comfort will improve Outcome: Progressing

## 2020-11-29 NOTE — Progress Notes (Signed)
Progress Note    Cathy Hall  WGN:[id number] DOB: 03/31/1971  DOA: 11/10/2020 PCP: Patient, No Pcp Per    Brief Narrative:   Chief complaint: Worsening dysarthria s/p recent stroke  Medical records reviewed and are as summarized below:  Cathy Hall is an 50 y.o. female with history of COPD, cocaine abuse, hypertension just discharged today earlier after being admitted for stroke with some left-sided weakness while sitting at home experienced worsening dysarthria and also difficulty managing her secretions.  Patient was brought in as a code stroke. -Stroke work-up completed has been awaiting SNF for rehab -1/22 with worsening cough congestion and low-grade fever  Assessment/Plan:   Principal Problem:   Acute CVA (cerebrovascular accident) (HCC) associated with dysphagia, dysarthria and mild left hemiparesis -Neurology following, repeat MRI noted progression of thalamic stroke and new areas of embolic stroke. -Followed by speech therapy, diet was gradually advanced to regular -Continue aspirin Plavix and statin -H/O cocaine abuse. 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 -Had been awaiting SNF for short-term rehab, difficult to place, patient is progressing and could potentially go home with home health services instead  Aspiration pneumonia -Developed cough congestion and low-grade fever this weekend, and temp of 101.3 yesterday -Imaging concerning for new infiltrates, concern for aspiration pneumonia, Covid PCR is negative -Clinically improving on IV Unasyn, will transition to oral Augmentin tomorrow -SLP reevaluation completed    Essential hypertension -Stable, continue Norvasc    ARF (acute renal failure) (HCC) -Stable    Cocaine Abuse -Counseled    Obesity -BMI 45.3, needs diet, lifestyle modification  Family Communication/Anticipated D/C date and plan/Code Status   DVT prophylaxis:  enoxaparin (LOVENOX) injection 40 mg Start: 11/11/20 1000 Code Status: Full Code.  Family Communication: No family at the bedside. Disposition Plan: Status is: Inpatient  Remains inpatient appropriate because: Aspiration pneumonia, recent stroke   Dispo: The patient is from: Home              Anticipated d/c is to: Could likely be progressed to home health              Anticipated d/c date is: Unknown              Patient currently is not medically stable to d/c.   Medical Consultants:    Neurology   Anti-Infectives:    None  Subjective:   -Feels better today, coughing a little bit but overall improving, about to walk the stairs with PT  Objective:    Vitals:   11/29/20 0332 11/29/20 0754 11/29/20 0924 11/29/20 1049  BP: 103/63  (!) 99/58 122/68  Pulse: 75  81 80  Resp: 17  18 18   Temp: 97.9 F (36.6 C)  97.7 F (36.5 C) 98.1 F (36.7 C)  TempSrc: Oral  Oral Oral  SpO2: 95% 99% 97% 99%  Weight:      Height:        Intake/Output Summary (Last 24 hours) at 11/29/2020 1202 Last data filed at 11/28/2020 1600 Gross per 24 hour  Intake --  Output 900 ml  Net -900 ml   Exam: General: Obese pleasant female laying in bed, AAOx3, no distress HEENT: Mild left facial droop CVS: S1-S2, regular rate rhythm Lungs: Few scattered basilar rhonchi, otherwise clear, no wheezing Abdomen: Obese, soft, nontender, bowel sounds present Extremities: No edema  Neuro: Left facial droop, mild dysarthria, mild left hemiplegia -known   Data Reviewed:   I have personally reviewed  following labs and imaging studies:  Labs: Labs show the following:   Basic Metabolic Panel: Recent Labs  Lab 11/24/20 0813 11/25/20 0430 11/27/20 0124  NA 139 138 136  K 4.4 4.9 4.2  CL 101 99 97*  CO2 25 24 29   GLUCOSE 136* 124* 174*  BUN 15 16 18   CREATININE 0.77 0.71 0.93  CALCIUM 8.7* 8.9 9.2   GFR Estimated Creatinine Clearance: 84.3 mL/min (by C-G formula based on SCr of 0.93  mg/dL). Liver Function Tests: No results for input(s): AST, ALT, ALKPHOS, BILITOT, PROT, ALBUMIN in the last 168 hours. No results for input(s): LIPASE, AMYLASE in the last 168 hours. Coagulation profile No results for input(s): INR, PROTIME in the last 168 hours.  CBC: Recent Labs  Lab 11/27/20 0124 11/29/20 0757  WBC 9.0 8.1  HGB 12.1 12.6  HCT 37.4 40.6  MCV 94.9 95.8  PLT 230 234   CBG: Recent Labs  Lab 11/26/20 1848 11/27/20 0026 11/27/20 0632 11/27/20 1201 11/27/20 1929  GLUCAP 173* 144* 159* 140* 191*    Microbiology Recent Results (from the past 240 hour(s))  SARS Coronavirus 2 by RT PCR (hospital order, performed in [facility] hospital lab) Nasopharyngeal Nasopharyngeal Swab     Status: None   Collection Time: 11/26/20  9:13 AM   Specimen: Nasopharyngeal Swab  Result Value Ref Range Status   SARS Coronavirus 2 NEGATIVE NEGATIVE Final    Comment: (NOTE) SARS-CoV-2 target nucleic acids are NOT DETECTED.  The SARS-CoV-2 RNA is generally detectable in upper and lower respiratory specimens during the acute phase of infection. The lowest concentration of SARS-CoV-2 viral copies this assay can detect is 250 copies / mL. A negative result does not preclude SARS-CoV-2 infection and should not be used as the sole basis for treatment or other patient management decisions.  A negative result may occur with improper specimen collection / handling, submission of specimen other than nasopharyngeal swab, presence of viral mutation(s) within the areas targeted by this assay, and inadequate number of viral copies (<250 copies / mL). A negative result must be combined with clinical observations, patient history, and epidemiological information.  Fact Sheet for Patients:   [facility]  Fact Sheet for Healthcare Providers: 11/28/20  This test is not yet approved or  cleared by the [employer] FDA and has  been authorized for detection and/or diagnosis of SARS-CoV-2 by FDA under an Emergency Use Authorization (EUA).  This EUA will remain in effect (meaning this test can be used) for the duration of the COVID-19 declaration under Section 564(b)(1) of the Act, 21 U.S.C. section 360bbb-3(b)(1), unless the authorization is terminated or revoked sooner.  Performed at [facility], [address] [place], [address] [place]   Urine Culture     Status: None   Collection Time: 11/28/20  8:24 AM   Specimen: Urine, Clean Catch  Result Value Ref Range Status   Specimen Description URINE, CLEAN CATCH  Final   Special Requests Normal  Final   Culture   Final    NO GROWTH Performed at [facility], [address], [place], [address] [place]    Report Status 11/29/2020 FINAL  Final    Procedures and diagnostic studies:  CT CHEST WO CONTRAST  Result Date: 11/28/2020 CLINICAL DATA:  Respiratory phase for, hypoxia, unexplained fever EXAM: CT CHEST WITHOUT CONTRAST TECHNIQUE: Multidetector CT imaging of the chest was performed following the standard protocol without IV contrast. Sagittal and coronal MPR images reconstructed from  axial data set. COMPARISON:  Chest radiograph 11/26/2020 FINDINGS: Cardiovascular: Heart upper normal size. Aorta normal caliber. No pericardial effusion. Mediastinum/Nodes: Esophagus unremarkable. No thoracic adenopathy. Base of cervical region normal appearance. Residual thymic tissue in anterior mediastinum. Lungs/Pleura: Patchy infiltrates in LEFT upper and LEFT lower lobes consistent with pneumonia. Minimal infiltrate RIGHT upper lobe. No pleural effusion or pneumothorax. No discrete pulmonary mass/nodule. Upper Abdomen: Unremarkable Musculoskeletal: Normal appearance IMPRESSION: Patchy infiltrates in LEFT upper and LEFT lower lobes consistent with pneumonia. Minimal infiltrate RIGHT upper lobe. Electronically Signed   By: Ulyses Southward M.D.   On: 11/28/2020 11:08     Medications:   . amLODipine  5 mg Oral Daily  . aspirin  300 mg Rectal Daily   Or  . aspirin  325 mg Oral Daily  . atorvastatin  40 mg Oral Daily  . clopidogrel  75 mg Oral Daily  . diclofenac Sodium  1 application Topical QID  . enoxaparin (LOVENOX) injection  40 mg Subcutaneous Q24H  . FLUoxetine  10 mg Oral Daily  . fluticasone  2 spray Each Nare Daily  . gabapentin  300 mg Oral TID  . loratadine  10 mg Oral Daily  . mometasone-formoterol  2 puff Inhalation BID  . montelukast  10 mg Oral QHS  . multivitamin with minerals  1 tablet Oral Daily  . nystatin  5 mL Oral QID  . OLANZapine  5 mg Oral QHS  . pantoprazole  40 mg Oral Daily  . umeclidinium bromide  1 puff Inhalation Daily   Continuous Infusions: . ampicillin-sulbactam (UNASYN) IV 3 g (11/29/20 0917)     LOS: 18 days   Zannie Cove, MD  [facility]   [facility] 11/29/2020, 12:02 PM

## 2020-11-29 NOTE — TOC Progression Note (Addendum)
Transition of Care [facility]) - Progression Note    Patient Details  Name: Cathy Hall MRN: [id number] Date of Birth: 03/03/1971  Transition of Care [facility]) CM/SW Contact  Janae Bridgeman, RN Phone Number: 11/29/2020, 10:06 AM  Clinical Narrative:    Case management reached out to [place], CM at [facility] for possible charity for home health PT/MSW.  Pending acceptance for home health services for patient due to recent history of substance abuse.  Edwin Dada, MSW emailed [facility] clinic at [facility] for charity PT/OT sessions online as backup plan since [facility] is reviewing patient's clinicals for possible safety issues at home relating to recent cocaine use upon admission to [facility].   I placed an order for wheelchair for home and called [employer] to have a charity wheelchair ordered and should be delivered to the hospital room by tomorrow..  I spoke with the patient at the bedside and patient is planning to discharge home with her boyfriend to [address] [place], [place] [address], where there are no steps to the home, once she is medically clear with attending physician.    The patient states that she can discharge to home by cab provided by [facility] transport.  She will need discharge medications through [facility] pharmacy for discharge when ready.  CM and MSW will continue to follow for discharge to home with family.  11/29/2020 1339Pearson Grippe, CM with [facility] was unable to provide home health services for the patient due to the patient's recent cocaine use at home.  Instructions were left in discharge plan for [facility]OPE physical therapy as outpatient at home.   Expected Discharge Plan: Skilled Nursing Facility Barriers to Discharge: No SNF bed,Continued Medical Work up,Inadequate or no insurance  Expected Discharge Plan and Services Expected Discharge Plan: Skilled Nursing Facility In-house Referral: Clinical Social Work,Financial [employer] / Management consultant Discharge  Planning Services: CM Consult Post Acute Care Choice: Skilled Nursing Facility Living arrangements for the past 2 months: Single Family Home Expected Discharge Date: 11/16/20                                     Social Determinants of Health (SDOH) Interventions    Readmission Risk Interventions Readmission Risk Prevention Plan 11/16/2020  Transportation Screening Complete  PCP or Specialist Appt within 3-5 Days Complete  HRI or Home Care Consult Complete  Social Work Consult for Recovery Care Planning/Counseling Complete  Palliative Care Screening Complete  Medication Review Oceanographer) Complete

## 2020-11-29 NOTE — Progress Notes (Signed)
Occupational Therapy Treatment Patient Details Name: Cathy Hall MRN: [id number] DOB: 11-27-1970 Today's Date: 11/29/2020    History of present illness Pt is a 50 y/o female admitted secondary to worsening L sided weakness, difficulty managing secretions and dysarthria. Thought to be secondary to extension of recent CVA. Recently discharged secondary to CVA. PMH inclues CVA, COPD, drug abuse, PTSD.   OT comments  OT treatment session with focus on functional cognition in prep for higher level BADLs. Patient completed pill box test without error and slightly increased time. Noted good bimanual manipulation to open bottles and compartments of pill box despite LUE weakness. Patient reports plan to d/c to her boyfriends home stating that he lives in a 1st floor apartment with ramped entry, tub/shower combo, and standard height toilet. Patient reports having a BSC. Education on use of BSC in shower 2/2 decreased activity tolerance and to maximize safety and independence. Patient reports that since hospital admission, her daughter has been taking care of finances and money management and will continue to do so for the foreseeable future. Patient would benefit from continued acute OT services in prep for safe d/c home. Recommendation updated to [facility] and 24hr supervision/assist.    Follow Up Recommendations  Home health OT;Supervision/Assistance - 24 hour    Equipment Recommendations  None recommended by OT (Patient has necessary DME.)    Recommendations for Other Services      Precautions / Restrictions Precautions Precautions: Fall Precaution Comments: mildly impulsive Required Braces or Orthoses: Other Brace Other Brace: Bledsoe brace on RLE Restrictions Weight Bearing Restrictions: Yes RLE Weight Bearing: Weight bearing as tolerated       Mobility Bed Mobility Overal bed mobility: Modified Independent                Transfers                       Balance     Sitting balance-Leahy Scale: Good                                     ADL either performed or assessed with clinical judgement   ADL                                               Vision       Perception     Praxis      Cognition Arousal/Alertness: Awake/alert Behavior During Therapy: WFL for tasks assessed/performed Overall Cognitive Status: Impaired/Different from baseline Area of Impairment: Awareness                           Awareness: Anticipatory   General Comments: Patient is easily distracted.        Exercises     Shoulder Instructions       General Comments Patient reporting use of flutter valve and I-S.    Pertinent Vitals/ Pain       Pain Assessment: No/denies pain  Home Living                                          Prior Functioning/Environment  Frequency  Min 2X/week        Progress Toward Goals  OT Goals(current goals can now be found in the care plan section)  Progress towards OT goals: Progressing toward goals  Acute Rehab OT Goals Patient Stated Goal: To return home. OT Goal Formulation: With patient Time For Goal Achievement: 12/09/20 Potential to Achieve Goals: Good ADL Goals Pt Will Perform Grooming: with modified independence;standing Pt Will Perform Upper Body Dressing: with modified independence Pt Will Perform Lower Body Dressing: with modified independence;sit to/from stand;with adaptive equipment Pt Will Transfer to Toilet: with modified independence;bedside commode;regular height toilet;ambulating Pt Will Perform Toileting - Clothing Manipulation and hygiene: with modified independence;sit to/from stand  Plan Frequency remains appropriate;Discharge plan needs to be updated    Co-evaluation                 AM-PAC OT "6 Clicks" Daily Activity     Outcome Measure   Help from another person eating meals?:  None Help from another person taking care of personal grooming?: A Little Help from another person toileting, which includes using toliet, bedpan, or urinal?: A Little Help from another person bathing (including washing, rinsing, drying)?: A Little Help from another person to put on and taking off regular upper body clothing?: A Little Help from another person to put on and taking off regular lower body clothing?: A Little 6 Click Score: 19    End of Session Equipment Utilized During Treatment: Gait belt  OT Visit Diagnosis: Unsteadiness on feet (R26.81);Other symptoms and signs involving cognitive function Hemiplegia - Right/Left: Left Hemiplegia - dominant/non-dominant: Non-Dominant Hemiplegia - caused by: Cerebral infarction   Activity Tolerance Patient tolerated treatment well   Patient Left in bed;with call bell/phone within reach;with bed alarm set   Nurse Communication          Time: 0354-6568 OT Time Calculation (min): 21 min  Charges: OT General Charges $OT Visit: 1 Visit OT Treatments $Therapeutic Activity: 8-22 mins  Cathy Hall Supplemental OT, Department of rehab services [phone]   Cathy Hall H. 11/29/2020, 12:48 PM

## 2020-11-30 ENCOUNTER — Other Ambulatory Visit (HOSPITAL_COMMUNITY): Payer: Self-pay | Admitting: Internal Medicine

## 2020-11-30 DIAGNOSIS — F1420 Cocaine dependence, uncomplicated: Secondary | ICD-10-CM

## 2020-11-30 LAB — COMPREHENSIVE METABOLIC PANEL
ALT: 27 U/L (ref 0–44)
AST: 25 U/L (ref 15–41)
Albumin: 3.4 g/dL — ABNORMAL LOW (ref 3.5–5.0)
Alkaline Phosphatase: 65 U/L (ref 38–126)
Anion gap: 12 (ref 5–15)
BUN: 16 mg/dL (ref 6–20)
CO2: 26 mmol/L (ref 22–32)
Calcium: 9.3 mg/dL (ref 8.9–10.3)
Chloride: 100 mmol/L (ref 98–111)
Creatinine, Ser: 0.9 mg/dL (ref 0.44–1.00)
GFR, Estimated: >60 mL/min (ref >60)
Glucose, Bld: 155 mg/dL — ABNORMAL HIGH (ref 70–99)
Potassium: 4.1 mmol/L (ref 3.5–5.1)
Sodium: 138 mmol/L (ref 135–145)
Total Bilirubin: 0.6 mg/dL (ref 0.3–1.2)
Total Protein: 7.2 g/dL (ref 6.5–8.1)

## 2020-11-30 LAB — PHOSPHORUS: Phosphorus: 3.9 mg/dL (ref 2.5–4.6)

## 2020-11-30 LAB — CBC WITH DIFFERENTIAL/PLATELET
Abs Immature Granulocytes: 0.07 10*3/uL (ref 0.00–0.07)
Basophils Absolute: 0.1 10*3/uL (ref 0.0–0.1)
Basophils Relative: 1 %
Eosinophils Absolute: 0.6 10*3/uL — ABNORMAL HIGH (ref 0.0–0.5)
Eosinophils Relative: 6 %
HCT: 41.7 % (ref 36.0–46.0)
Hemoglobin: 12.9 g/dL (ref 12.0–15.0)
Immature Granulocytes: 1 %
Lymphocytes Relative: 31 %
Lymphs Abs: 2.9 10*3/uL (ref 0.7–4.0)
MCH: 29.6 pg (ref 26.0–34.0)
MCHC: 30.9 g/dL (ref 30.0–36.0)
MCV: 95.6 fL (ref 80.0–100.0)
Monocytes Absolute: 0.6 10*3/uL (ref 0.1–1.0)
Monocytes Relative: 7 %
Neutro Abs: 5 10*3/uL (ref 1.7–7.7)
Neutrophils Relative %: 54 %
Platelets: 281 10*3/uL (ref 150–400)
RBC: 4.36 MIL/uL (ref 3.87–5.11)
RDW: 12.5 % (ref 11.5–15.5)
WBC: 9.2 10*3/uL (ref 4.0–10.5)
nRBC: 0 % (ref 0.0–0.2)

## 2020-11-30 LAB — MAGNESIUM: Magnesium: 1.7 mg/dL (ref 1.7–2.4)

## 2020-11-30 MED ORDER — SALINE SPRAY 0.65 % NA SOLN: 1.0000 | NASAL | 0 refills | Status: DC | PRN

## 2020-11-30 MED ORDER — MENTHOL 3 MG MT LOZG: 1.0000 | LOZENGE | OROMUCOSAL | 12 refills | Status: DC | PRN

## 2020-11-30 MED ORDER — NYSTATIN 100000 UNIT/ML MT SUSP: 5.0000 mL | Freq: Four times a day (QID) | OROMUCOSAL | 0 refills | Status: DC

## 2020-11-30 MED ORDER — UMECLIDINIUM BROMIDE 62.5 MCG/INH IN AEPB: 1.0000 | INHALATION_SPRAY | Freq: Every day | RESPIRATORY_TRACT | 0 refills | Status: DC

## 2020-11-30 MED ORDER — ALBUTEROL SULFATE HFA 108 (90 BASE) MCG/ACT IN AERS: 2.0000 | INHALATION_SPRAY | RESPIRATORY_TRACT | 0 refills | Status: DC | PRN

## 2020-11-30 MED ORDER — GABAPENTIN 300 MG PO CAPS: 300.0000 mg | ORAL_CAPSULE | Freq: Three times a day (TID) | ORAL | 0 refills | Status: DC

## 2020-11-30 MED ORDER — CLOPIDOGREL BISULFATE 75 MG PO TABS: 75.0000 mg | ORAL_TABLET | Freq: Every day | ORAL | 0 refills | Status: DC

## 2020-11-30 MED ORDER — ASPIRIN 325 MG PO TABS: 325.0000 mg | ORAL_TABLET | Freq: Every day | ORAL | 0 refills | Status: DC

## 2020-11-30 MED ORDER — ACETAMINOPHEN 325 MG PO TABS: 650.0000 mg | ORAL_TABLET | ORAL | 0 refills | Status: DC | PRN

## 2020-11-30 MED ORDER — BENZOCAINE 10 % MT GEL: Freq: Four times a day (QID) | OROMUCOSAL | 0 refills | Status: DC | PRN

## 2020-11-30 MED ORDER — FLUOXETINE HCL 10 MG PO CAPS: 10.0000 mg | ORAL_CAPSULE | Freq: Every day | ORAL | 0 refills | Status: DC

## 2020-11-30 MED ORDER — HYDROXYZINE HCL 25 MG PO TABS: 25.0000 mg | ORAL_TABLET | Freq: Three times a day (TID) | ORAL | 0 refills | Status: DC | PRN

## 2020-11-30 MED ORDER — ADULT MULTIVITAMIN W/MINERALS CH: 1.0000 | ORAL_TABLET | Freq: Every day | ORAL | 0 refills | Status: AC

## 2020-11-30 MED ORDER — MAGNESIUM SULFATE 2 GM/50ML IV SOLN
2.0000 g | Freq: Once | INTRAVENOUS | Status: AC
  Administered 2020-11-30: 2 g via INTRAVENOUS
  Filled 2020-11-30: qty 50

## 2020-11-30 MED ORDER — LORATADINE 10 MG PO TABS: 10.0000 mg | ORAL_TABLET | Freq: Every day | ORAL | 0 refills | Status: DC

## 2020-11-30 MED ORDER — AMOXICILLIN-POT CLAVULANATE 875-125 MG PO TABS: 1.0000 | ORAL_TABLET | Freq: Two times a day (BID) | ORAL | 0 refills | Status: DC

## 2020-11-30 MED ORDER — PANTOPRAZOLE SODIUM 40 MG PO TBEC: 40.0000 mg | DELAYED_RELEASE_TABLET | Freq: Every day | ORAL | 0 refills | Status: DC

## 2020-11-30 MED ORDER — ATORVASTATIN CALCIUM 40 MG PO TABS: 40.0000 mg | ORAL_TABLET | Freq: Every day | ORAL | 0 refills | Status: DC

## 2020-11-30 MED ORDER — OLANZAPINE 5 MG PO TABS: 5.0000 mg | ORAL_TABLET | Freq: Every day | ORAL | 0 refills | Status: DC

## 2020-11-30 MED ORDER — DICLOFENAC SODIUM 1 % EX GEL: 1.0000 "application " | Freq: Four times a day (QID) | CUTANEOUS | 0 refills | Status: DC

## 2020-11-30 MED ORDER — AMLODIPINE BESYLATE 5 MG PO TABS: 5.0000 mg | ORAL_TABLET | Freq: Every day | ORAL | 0 refills | Status: DC

## 2020-11-30 MED ORDER — MONTELUKAST SODIUM 10 MG PO TABS: 10.0000 mg | ORAL_TABLET | Freq: Every day | ORAL | 0 refills | Status: DC

## 2020-11-30 MED ORDER — POLYVINYL ALCOHOL 1.4 % OP SOLN: 1.0000 [drp] | Freq: Every day | OPHTHALMIC | 0 refills | Status: DC | PRN

## 2020-11-30 MED ORDER — FLUTICASONE PROPIONATE 50 MCG/ACT NA SUSP: 2.0000 | Freq: Every day | NASAL | 0 refills | Status: DC

## 2020-11-30 MED ORDER — RESOURCE THICKENUP CLEAR PO POWD: 1.0000 | ORAL | 0 refills | Status: DC | PRN

## 2020-11-30 MED ORDER — MOMETASONE FURO-FORMOTEROL FUM 100-5 MCG/ACT IN AERO: 2.0000 | INHALATION_SPRAY | Freq: Two times a day (BID) | RESPIRATORY_TRACT | 0 refills | Status: DC

## 2020-11-30 MED FILL — ACETAMINOPHEN 325 MG TABS: 325 | 3 days supply | Qty: 30 | Fill #0

## 2020-11-30 MED FILL — PANTOPRAZOLE SOD DR 40 MG T: 40 | 30 days supply | Qty: 30 | Fill #0

## 2020-11-30 MED FILL — RESOURCE THICKEN UP: 30 days supply | Qty: 125 | Fill #0

## 2020-11-30 MED FILL — GABAPENTIN 300 MG CAPSULE: 300 | 30 days supply | Qty: 90 | Fill #0

## 2020-11-30 MED FILL — ALBUTEROL SULFATE HFA 108 (: 108 (90 BAS | 25 days supply | Qty: 18 | Fill #0

## 2020-11-30 MED FILL — SM NASAL SPRAY SALINE 0.65: 0.65 | 30 days supply | Qty: 88 | Fill #0

## 2020-11-30 MED FILL — hydrOXYzine HCL 25 MG TABS: 25 | 30 days supply | Qty: 90 | Fill #0

## 2020-11-30 MED FILL — ATORVASTATIN CALCIUM 40 MG: 40 | 30 days supply | Qty: 30 | Fill #0

## 2020-11-30 MED FILL — NYSTATIN 100000 UNIT/ML SUS: 100000 | 2 days supply | Qty: 60 | Fill #0

## 2020-11-30 MED FILL — AMOX-CLAV 875-125 MG TABLET: 875-125 | 7 days supply | Qty: 14 | Fill #0

## 2020-11-30 MED FILL — AMLODIPINE BESYLATE 5 MG TA: 5 | 30 days supply | Qty: 30 | Fill #0

## 2020-11-30 MED FILL — MONTELUKAST SOD 10 MG TAB: 10 | 30 days supply | Qty: 30 | Fill #0

## 2020-11-30 MED FILL — INCRUSE ELLIPTA 62.5 MCG IN: 62.5 | 30 days supply | Qty: 30 | Fill #0

## 2020-11-30 MED FILL — FLUTICASONE PROP 50 MCG SPR: 50 | 30 days supply | Qty: 16 | Fill #0

## 2020-11-30 MED FILL — LORATADINE 10 MG TABLET: 10 | 30 days supply | Qty: 30 | Fill #0

## 2020-11-30 MED FILL — DULERA 100-5 MCG/ACT AERO: 100-5 | 30 days supply | Qty: 13 | Fill #0

## 2020-11-30 MED FILL — FLUoxetine HCL 10 MG CAPS: 10 | 30 days supply | Qty: 30 | Fill #0

## 2020-11-30 MED FILL — CLOPIDOGREL 75 MG TABLET: 75 | 30 days supply | Qty: 30 | Fill #0

## 2020-11-30 MED FILL — SM EC ASPIRIN 325MG TABLET: 325 | 30 days supply | Qty: 30 | Fill #0

## 2020-11-30 MED FILL — DICLOFENAC SODIUM 1 % GEL: 1 | 8 days supply | Qty: 100 | Fill #0

## 2020-11-30 MED FILL — OLANZapine 5 MG TABS: 5 | 30 days supply | Qty: 30 | Fill #0

## 2020-11-30 MED FILL — ARTIFICIAL TEARS 0.2-0.2-1: 0.2-0.2-1 | 30 days supply | Qty: 15 | Fill #0

## 2020-11-30 NOTE — Progress Notes (Signed)
Patient refuse for bed alarm to be on despite the nurse educating why the bed alarm is needed. Patient stated I am going home tomorrow and I don't want the bed alarm. This nurse told her to always call staff members when she needs assistant.

## 2020-11-30 NOTE — Progress Notes (Signed)
Physical Therapy Treatment Patient Details Name: Cathy Hall MRN: [id number] DOB: 1970-12-27 Today's Date: 11/30/2020    History of Present Illness Pt is a 50 y/o female admitted secondary to worsening L sided weakness, difficulty managing secretions and dysarthria. Thought to be secondary to extension of recent CVA. Recently discharged secondary to CVA. PMH inclues CVA, COPD, drug abuse, PTSD.    PT Comments    Upon arrival, pt beginning to prepare and pack her room for d/c home. Thus, focused session on improving balance with functional tasks in sitting and standing, challenging her to reach off BOS and across midline to pack. Pt demonstrates great balance in sitting but some balance deficits in standing. However, her balance is functional allowing her to navigate through tight spaces while carrying objects without LOB, meaning she is at decreased risk for falls. She continues to display gait deviations, primarily due to her R knee pain, but is able to negotiate stairs to enter/exit her home safely at d/c. Will continue to follow acutely. Current recommendations remain appropriate.   Follow Up Recommendations  Home health PT;Supervision for mobility/OOB     Equipment Recommendations  Wheelchair (measurements PT);Rolling walker with 5" wheels    Recommendations for Other Services       Precautions / Restrictions Precautions Precautions: Fall Precaution Comments: mildly impulsive Required Braces or Orthoses: Other Brace Other Brace: Bledsoe brace on RLE Restrictions Weight Bearing Restrictions: Yes RLE Weight Bearing: Weight bearing as tolerated    Mobility  Bed Mobility Overal bed mobility: Modified Independent       Supine to sit: Modified independent (Device/Increase time)     General bed mobility comments: Pt sitting up in w/c upon arrival.  Transfers Overall transfer level: Needs assistance Equipment used: Rolling walker (2 wheeled);None Transfers:  Sit to/from Stand Sit to Stand: Supervision         General transfer comment: Coming to stand 3x from w/c and 2x from recliner with supervision for safety. Intermittent use of RW.  Ambulation/Gait Ambulation/Gait assistance: Min guard Gait Distance (Feet): 100 Feet (x3 bouts of ~15 ft > ~15 ft > ~100 ft) Assistive device: Rolling walker (2 wheeled);None Gait Pattern/deviations: Step-to pattern;Step-through pattern;Decreased stance time - right;Decreased step length - left Gait velocity: reduced Gait velocity interpretation: <1.8 ft/sec, indicate of risk for recurrent falls General Gait Details: Slow, mildly unsteady gait with decreased stance time RLE due to pain. Cued pt to increase knee and hip flexion with swing to decrease foot drag and excessive compensation through hip rotation, mod success. Moments of using RW and no UE support while carrying bags in room in tight spaces, no overt LOB.   Stairs Stairs: Yes Stairs assistance: Min guard Stair Management: One rail Right;Step to pattern Number of Stairs: 2 General stair comments: R rail ascending, L descending to simulate home. Cued pt to perform step-to pattern leading up with L and down with R leg due to R leg pain, success and good carryover. Min guard for safety, no overt LOB.   Wheelchair Mobility    Modified Rankin (Stroke Patients Only) Modified Rankin (Stroke Patients Only) Pre-Morbid Rankin Score: Slight disability Modified Rankin: Moderate disability     Balance Overall balance assessment: Needs assistance Sitting-balance support: Feet supported;No upper extremity supported Sitting balance-Leahy Scale: Normal Sitting balance - Comments: Reaches off BOS max in all directions without LOB.   Standing balance support: No upper extremity supported Standing balance-Leahy Scale: Good Standing balance comment: Reaches mod off BOS without LOB, reaching to bed  and across midline to pack bags. Able to keep her balance  carrying bags in tight spaces.                            Cognition Arousal/Alertness: Awake/alert Behavior During Therapy: WFL for tasks assessed/performed Overall Cognitive Status: Impaired/Different from baseline Area of Impairment: Awareness;Attention;Problem solving                   Current Attention Level: Sustained       Awareness: Anticipatory Problem Solving: Slow processing;Requires verbal cues General Comments: Patient is easily distracted.      Exercises      General Comments        Pertinent Vitals/Pain Pain Assessment: Faces Faces Pain Scale: Hurts little more Pain Location: Rt knee Pain Descriptors / Indicators: Discomfort;Grimacing;Guarding Pain Intervention(s): Limited activity within patient's tolerance;Monitored during session;Repositioned    Home Living                      Prior Function            PT Goals (current goals can now be found in the care plan section) Acute Rehab PT Goals Patient Stated Goal: To return home. PT Goal Formulation: With patient Time For Goal Achievement: 12/09/20 Potential to Achieve Goals: Good Progress towards PT goals: Progressing toward goals    Frequency    Min 3X/week      PT Plan Current plan remains appropriate    Co-evaluation              AM-PAC PT "6 Clicks" Mobility   Outcome Measure  Help needed turning from your back to your side while in a flat bed without using bedrails?: None Help needed moving from lying on your back to sitting on the side of a flat bed without using bedrails?: None Help needed moving to and from a bed to a chair (including a wheelchair)?: A Little Help needed standing up from a chair using your arms (e.g., wheelchair or bedside chair)?: A Little Help needed to walk in hospital room?: A Little Help needed climbing 3-5 steps with a railing? : A Little 6 Click Score: 20    End of Session Equipment Utilized During Treatment: Gait  belt Activity Tolerance: Patient tolerated treatment well Patient left: Other (comment) (in w/c preparing to d/c)   PT Visit Diagnosis: Unsteadiness on feet (R26.81);Muscle weakness (generalized) (M62.81);Other abnormalities of gait and mobility (R26.89);Difficulty in walking, not elsewhere classified (R26.2)     Time: 4034-7425 PT Time Calculation (min) (ACUTE ONLY): 22 min  Charges:  $Gait Training: 8-22 mins                     Raymond Gurney, PT, DPT Acute Rehabilitation Services  Pager: [phone] Office: [phone]    Jewel Baize 11/30/2020, 3:15 PM

## 2020-11-30 NOTE — TOC Transition Note (Signed)
Transition of Care [facility]) - CM/SW Discharge Note   Patient Details  Name: Cathy Hall MRN: [id number] Date of Birth: July 19, 1971  Transition of Care [facility]) CM/SW Contact:  Curlene Labrum, RN Phone Number: 11/30/2020, 1:26 PM   Clinical Narrative:    Case management met with the patient at the bedside regarding discharge planning for home.  The patient is discharging home today by cab once she completes her dose of IV antibiotics today.  Match was reinstated by Lynnea Ferrier, RNCM with TOC and 19.00 petty cash was delivered to the [facility] for medications not covered by [facility].  The medications will be delivered to the patient's hospital room prior to discharge to home.  I spoke with Lannette Donath, RN, primacy nurse and she plans to call the patient's cab for discharge to home once the patient finishes her IV antibiotic this afternoon.    CM and MSW will continue to follow the patient until she is discharged today.   Final next level of care: [place] Barriers to Discharge: No SNF bed,Continued Medical Work up,Inadequate or no insurance   Patient Goals and CMS Choice Patient states their goals for this hospitalization and ongoing recovery are:: Patient plans to discharge to SNF for rehab. CMS Medicare.gov Compare Post Acute Care list provided to:: Patient Choice offered to / list presented to : Patient  Discharge Placement                       Discharge Plan and Services In-house Referral: Clinical Social Work,Financial [employer] [facility] Discharge Planning Services: CM Consult Post Acute Care Choice: [place]                               Social Determinants of Health (SDOH) Interventions     Readmission Risk Interventions Readmission Risk Prevention Plan 11/16/2020  Transportation Screening Complete  PCP or Specialist Appt within 3-5 Days Complete  HRI or [place] Complete  Social Work  Consult for [place] Planning/Counseling Complete  Palliative Care Screening Complete  Medication Review [employer]) Complete

## 2020-11-30 NOTE — Discharge Planning (Signed)
RNCM reinstated pt in to Medication Assistance Through [facility]) program to help pt afford Rx.

## 2020-11-30 NOTE — Progress Notes (Signed)
SLP Cancellation Note  Patient Details Name: Cathy Hall MRN: [id number] DOB: [birth year]   Cancelled treatment:        SLP getting ready to see and she is being discharged, gathering her things.    Royce Macadamia 11/30/2020, 2:22 PM

## 2020-11-30 NOTE — Progress Notes (Signed)
CSW spoke with patient regarding her discharge plan - she is agreeable and joyful that she gets to return home. Patient is aware she has been referred to [facility] for PT and OT. Patient will require assistance with transportation home - she will go via cab to [address] [place]  Marcelino Duster RN CM is working to obtain the patient's medications from [employer].  Edwin Dada, MSW, LCSW-A Transitions of Care  Clinical Social Worker I [phone]

## 2020-11-30 NOTE — Progress Notes (Signed)
Pt discharge education and instructions completed. Pt voices understanding and denies any questions. Pt IV removed, pt discharge home with [employer] to transport her home. Pt home DME wheelchair and prescribed medications delivered to pt bedside by pharmacy. Pt transported off unit via her personal wheelchair with belongings to the side. Dionne Bucy RN

## 2020-11-30 NOTE — Care Management (Signed)
Case management spoke with [employer] this morning and the patient's wheelchair was delivered to the patient's hospital room on 11/29/2020.

## 2020-11-30 NOTE — Progress Notes (Addendum)
Oxycodone 7.5 given at 0504 hrs and 2.5 wasted in the stericycle with Chiropodist of nursing Arnoldo Morale).

## 2020-11-30 NOTE — Progress Notes (Signed)
Occupational Therapy Treatment Patient Details Name: Cathy Hall MRN: [id number] DOB: Jun 11, 1971 Today's Date: 11/30/2020    History of present illness Pt is a 50 y/o female admitted secondary to worsening L sided weakness, difficulty managing secretions and dysarthria. Thought to be secondary to extension of recent CVA. Recently discharged secondary to CVA. PMH inclues CVA, COPD, drug abuse, PTSD.   OT comments  Pt with excellent participation this date; on arrival to room reporting plan is for dc to home with family support for IADL's. W/C present in room, OT spent majority of session introducing and educating pt on management of w/c, including donning/doffing removable leg rests/pads, cushion, breaks, and height of wheelchair. Pt demo's need for Sup intermittent min A for leg rest management and sup for transfers with good safety with locking/unlocking breaks and swinging away leg rests for transfers. Discussed energy conservation and balance techniques to maximize safety with ADL's including tub transfer bench and seated bathing and dressing activities to address mild balance deficits still present. Pending continued hospitalization, OT will follow while in house. Recommendations remain appropriate.    Follow Up Recommendations  Home health OT;Supervision/Assistance - 24 hour    Equipment Recommendations  None recommended by OT    Recommendations for Other Services      Precautions / Restrictions Precautions Precautions: Fall Precaution Comments: mildly impulsive Required Braces or Orthoses: Other Brace Other Brace: Bledsoe brace on RLE Restrictions Weight Bearing Restrictions: Yes RLE Weight Bearing: Weight bearing as tolerated       Mobility Bed Mobility Overal bed mobility: Modified Independent       Supine to sit: Modified independent (Device/Increase time)        Transfers      Balance     ADL either performed or assessed with clinical  judgement   ADL       Toilet Transfer: Supervision/safety   Toileting- Clothing Manipulation and Hygiene: Supervision/safety       Functional mobility during ADLs: Supervision/safety General ADL Comments: Sup (close Sup) for mobility in room with slowed speed for safety wtih brace donned     Vision       Perception     Praxis      Cognition Arousal/Alertness: Awake/alert Behavior During Therapy: WFL for tasks assessed/performed Overall Cognitive Status: Impaired/Different from baseline Area of Impairment: Awareness                   Current Attention Level: Sustained       Awareness: Anticipatory Problem Solving: Slow processing;Requires verbal cues;Requires tactile cues General Comments: Patient is easily distracted.        Exercises     Shoulder Instructions       General Comments      Pertinent Vitals/ Pain       Pain Assessment: No/denies pain  Home Living                                          Prior Functioning/Environment              Frequency  Min 2X/week        Progress Toward Goals  OT Goals(current goals can now be found in the care plan section)  Progress towards OT goals: Progressing toward goals  Acute Rehab OT Goals Patient Stated Goal: To return home. OT Goal Formulation: With patient Time For Goal Achievement: 12/09/20 Potential  to Achieve Goals: Good ADL Goals Pt Will Perform Grooming: with modified independence;standing Pt Will Perform Upper Body Dressing: with modified independence Pt Will Perform Lower Body Dressing: with modified independence;sit to/from stand;with adaptive equipment Pt Will Transfer to Toilet: with modified independence;bedside commode;regular height toilet;ambulating Pt Will Perform Toileting - Clothing Manipulation and hygiene: with modified independence;sit to/from stand  Plan Frequency remains appropriate;Discharge plan needs to be updated    Co-evaluation                  AM-PAC OT "6 Clicks" Daily Activity     Outcome Measure   Help from another person eating meals?: None Help from another person taking care of personal grooming?: A Little Help from another person toileting, which includes using toliet, bedpan, or urinal?: A Little Help from another person bathing (including washing, rinsing, drying)?: A Little Help from another person to put on and taking off regular upper body clothing?: A Little Help from another person to put on and taking off regular lower body clothing?: A Little 6 Click Score: 19    End of Session Equipment Utilized During Treatment: Gait belt  OT Visit Diagnosis: Unsteadiness on feet (R26.81);Other symptoms and signs involving cognitive function Hemiplegia - Right/Left: Left Hemiplegia - dominant/non-dominant: Non-Dominant Hemiplegia - caused by: Cerebral infarction   Activity Tolerance Patient tolerated treatment well   Patient Left in chair;with call bell/phone within reach (in wheelchair/breaks locked)   Nurse Communication Mobility status;Patient requests pain meds    Time: 1041-1107 OT Time Calculation (min): 26 min  Charges: OT General Charges $OT Visit: 1 Visit OT Treatments $Self Care/Home Management : 23-37 mins  Tayshawn Purnell OTR/L acute rehab services Office: [phone]  Wilhemena Durie 11/30/2020, 12:10 PM

## 2020-11-30 NOTE — Discharge Summary (Signed)
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 atherosclerotic calcification aortic arch. Proximal great vessels widely patent. Right carotid system: Mild atherosclerotic disease right carotid bifurcation without significant stenosis Left carotid system: Mild atherosclerotic disease left carotid bifurcation without stenosis. Vertebral arteries: Both vertebral arteries widely patent without stenosis. Skeleton: No acute skeletal abnormality. Other neck: Thyromegaly without focal lesion. Upper chest: Lung apices clear bilaterally. Review of the MIP images confirms the above findings CTA HEAD FINDINGS Anterior circulation: Mild atherosclerotic calcification in the cavernous carotid bilaterally without stenosis. Anterior and middle cerebral arteries normal bilaterally without stenosis or large vessel occlusion. Posterior circulation: Both vertebral arteries patent to the basilar. Basilar widely patent.  Superior cerebellar and posterior cerebral arteries patent bilaterally without stenosis. Small left PICA patent. Right PICA not visualized. AICA and superior cerebellar arteries patent bilaterally. Venous sinuses: Limited venous enhancement Anatomic variants: None Review of the MIP images confirms the above findings CT Brain Perfusion Findings: ASPECTS: 10 CBF (<30%) Volume: 0mL Perfusion (Tmax>6.0s) volume: 0mL Mismatch Volume: 0mL Infarction Location:None IMPRESSION: 1. CT perfusion negative for acute infarct or ischemia 2. Negative for intracranial large vessel occlusion 3. No significant carotid or vertebral artery stenosis. 4. These results were called by telephone at the time of interpretation on 11/05/2020 at 7:03 pm to provider ERIC [facility] , who verbally acknowledged these results. Electronically Signed   By: Marlan Palau M.D.   On: 11/05/2020 19:04   CT CHEST WO CONTRAST  Result Date: 11/28/2020 CLINICAL DATA:  Respiratory phase for, hypoxia, unexplained fever EXAM: CT CHEST WITHOUT CONTRAST TECHNIQUE: Multidetector CT imaging of the chest was performed following the standard protocol without IV contrast. Sagittal and coronal MPR images reconstructed from axial data set. COMPARISON:  Chest radiograph 11/26/2020 FINDINGS: Cardiovascular: Heart upper normal size. Aorta normal caliber. No pericardial effusion. Mediastinum/Nodes: Esophagus unremarkable.  No thoracic adenopathy. Base of cervical region normal appearance. Residual thymic tissue in anterior mediastinum. Lungs/Pleura: Patchy infiltrates in LEFT upper and LEFT lower lobes consistent with pneumonia. Minimal infiltrate RIGHT upper lobe. No pleural effusion or pneumothorax. No discrete pulmonary mass/nodule. Upper Abdomen: Unremarkable Musculoskeletal: Normal appearance IMPRESSION: Patchy infiltrates in LEFT upper and LEFT lower lobes consistent with pneumonia. Minimal infiltrate RIGHT upper lobe. Electronically Signed   By: Ulyses Southward M.D.   On:  11/28/2020 11:08   CT KNEE RIGHT WO CONTRAST  Result Date: 11/22/2020 CLINICAL DATA:  Chronic right knee pain. EXAM: CT OF THE RIGHT KNEE WITHOUT CONTRAST TECHNIQUE: Multidetector CT imaging of the right knee was performed according to the standard protocol. Multiplanar CT image reconstructions were also generated. COMPARISON:  Plain films right knee 11/22/2020 and 07/17/2020. FINDINGS: Bones/Joint/Cartilage There is no acute bony or joint abnormality. No joint effusion. No osteophytosis, chondrocalcinosis or erosion. Ligaments Suboptimally assessed by CT. Small ossification in the superior fibers of the medial collateral ligament is consistent with remote medial collateral ligament injury. Although the lateral compartment appears abnormally widened, the lateral collateral ligament complex appears intact on CT. The lateral meniscus appears torn. The medial meniscus, ACL and PCL appear intact. Muscles and Tendons Intact and normal in appearance. Soft tissues A Baker's cyst measures 2 cm transverse x 1 cm AP x 3 cm craniocaudal. IMPRESSION: Negative for acute bony abnormality. The lateral meniscus appears torn. Although the lateral compartment is abnormally widened in appearance, the lateral collateral ligament complex appears intact on CT. These findings could be better evaluated with a noncontrast MRI. Findings consistent with remote medial collateral ligament sprain. Small Baker's cyst. Electronically Signed   By: Drusilla Kanner M.D.   On: 11/22/2020 15:48   MR BRAIN WO CONTRAST  Result Date: 11/11/2020 CLINICAL DATA:  Acute neuro deficit.  Recent stroke. EXAM: MRI HEAD WITHOUT CONTRAST TECHNIQUE: Multiplanar, multiecho pulse sequences of the brain and surrounding structures were obtained without intravenous contrast. COMPARISON:  MRI head 11/05/2020.  CT head 11/10/2020 FINDINGS: Brain: Limited study. Axial diffusion-weighted imaging is diagnostic. Motion on the coronal diffusion. The patient refused  further imaging. Subacute infarct in the left posterior midbrain unchanged from the prior study. Acute infarct in the right thalamus has progressed since the prior MRI. In addition, there are multiple new small acute infarcts throughout both cerebral hemispheres primarily involving the subcortical white matter. IMPRESSION: Very limited study. Progression of right thalamic infarct. Multiple new small white matter infarcts throughout both cerebral hemispheres suggestive of emboli. Electronically Signed   By: Marlan Palau M.D.   On: 11/11/2020 11:50   MR BRAIN W WO CONTRAST  Result Date: 11/05/2020 CLINICAL DATA:  Acute neuro deficit rule out stroke EXAM: MRI HEAD WITHOUT AND WITH CONTRAST TECHNIQUE: Multiplanar, multiecho pulse sequences of the brain and surrounding structures were obtained without and with intravenous contrast. CONTRAST:  9.18mL GADAVIST GADOBUTROL 1 MMOL/ML IV SOLN COMPARISON:  CT angio head and CT perfusion 11/05/2020 FINDINGS: Brain: Small area of acute infarct in the left posterior midbrain in the roof of the fourth ventricle. Additional small area of acute infarct in the right thalamus. Minimal chronic white matter changes. 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 Negative for hemorrhage or mass. Normal enhancement postcontrast administration. Vascular: Normal arterial flow voids Skull and upper cervical spine: No focal skeletal abnormality. Sinuses/Orbits: Mucosal edema left maxillary sinus. Mild mastoid effusion bilaterally. Normal orbit Other: None IMPRESSION: Acute infarct in the left posterior midbrain in the roof of the  fourth ventricle. Small acute infarct right anterior thalamus. 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 Electronically Signed   By: Marlan Palau M.D.   On: 11/05/2020 20:15   MR KNEE RIGHT WO CONTRAST  Result Date: 11/23/2020 CLINICAL DATA:  Knee trauma and pain EXAM: MRI OF THE RIGHT KNEE WITHOUT CONTRAST TECHNIQUE: Multiplanar, multisequence MR imaging  of the knee was performed. No intravenous contrast was administered. COMPARISON:  None. FINDINGS: MENISCI Medial: Intact. Lateral: There is a nondisplaced horizontal longitudinal tear seen of the posterior horn of the lateral meniscus extending to the mid body. There is a meniscal flounce noted within the mid body. LIGAMENTS Cruciates: Increased signal seen at the anterior ACL, however there are intact fibers seen throughout. The PCL is intact. Collaterals: The MCL is intact. The lateral collateral ligamentous complex is intact. CARTILAGE Patellofemoral: Focal chondral fissuring seen within the lateral patellar facet with underlying subchondral cystic changes. Medial compartment: Chondral fissuring seen the weight-bearing surface of the medial femoral condyle with area of focal delamination measuring 4 mm there is chondral thinning seen in the medial tibial plateau. Lateral compartment: Mild chondral thinning seen the weight-bearing surface of the lateral femoral condyle. BONES: No fracture. No avascular necrosis. No pathologic marrow infiltration. JOINT: No joint effusion. Normal Hoffa's fat-pad. No plical thickening. EXTENSOR MECHANISM: The patellar and quadriceps tendon are intact. The retinaculum is unremarkable. POPLITEAL FOSSA: A loculated popliteal cyst is present without evidence of rupture. OTHER:  The visualized muscles are normal in appearance. IMPRESSION: Nondisplaced tear of the posterior lateral meniscus with meniscal flounce. Intrasubstance degeneration of the anterior ACL, however is intact. Tricompartmental chondral disease, most notable within the patellofemoral compartment. Electronically Signed   By: Jonna Clark M.D.   On: 11/23/2020 23:54   CT CEREBRAL PERFUSION W CONTRAST  Result Date: 11/05/2020 CLINICAL DATA:  Acute neuro deficit. Left facial droop, headache and slurred speech EXAM: CT ANGIOGRAPHY HEAD AND NECK CT PERFUSION BRAIN TECHNIQUE: Multidetector CT imaging of the head and neck  was performed using the standard protocol during bolus administration of intravenous contrast. Multiplanar CT image reconstructions and MIPs were obtained to evaluate the vascular anatomy. Carotid stenosis measurements (when applicable) are obtained utilizing NASCET criteria, using the distal internal carotid diameter as the denominator. Multiphase CT imaging of the brain was performed following IV bolus contrast injection. Subsequent parametric perfusion maps were calculated using RAPID software. CONTRAST:  OMNIPAQUE IOHEXOL 350 MG/ML SOLN COMPARISON:  CT head 11/05/2020 FINDINGS: CTA NECK FINDINGS Aortic arch: Mild atherosclerotic calcification aortic arch. Proximal great vessels widely patent. Right carotid system: Mild atherosclerotic disease right carotid bifurcation without significant stenosis Left carotid system: Mild atherosclerotic disease left carotid bifurcation without stenosis. Vertebral arteries: Both vertebral arteries widely patent without stenosis. Skeleton: No acute skeletal abnormality. Other neck: Thyromegaly without focal lesion. Upper chest: Lung apices clear bilaterally. Review of the MIP images confirms the above findings CTA HEAD FINDINGS Anterior circulation: Mild atherosclerotic calcification in the cavernous carotid bilaterally without stenosis. Anterior and middle cerebral arteries normal bilaterally without stenosis or large vessel occlusion. Posterior circulation: Both vertebral arteries patent to the basilar. Basilar widely patent. Superior cerebellar and posterior cerebral arteries patent bilaterally without stenosis. Small left PICA patent. Right PICA not visualized. AICA and superior cerebellar arteries patent bilaterally. Venous sinuses: Limited venous enhancement Anatomic variants: None Review of the MIP images confirms the above findings CT Brain Perfusion Findings: ASPECTS: 10 CBF (<30%) Volume: 0mL Perfusion (Tmax>6.0s) volume: 0mL Mismatch Volume: 0mL Infarction  Location:None IMPRESSION: 1.  CT perfusion negative for acute infarct or ischemia 2. Negative for intracranial large vessel occlusion 3. No significant carotid or vertebral artery stenosis. 4. These results were called by telephone at the time of interpretation on 11/05/2020 at 7:03 pm to provider ERIC [facility] , who verbally acknowledged these results. Electronically Signed   By: Marlan Palau M.D.   On: 11/05/2020 19:04   DG CHEST PORT 1 VIEW  Result Date: 11/26/2020 CLINICAL DATA:  Cough and shortness of breath. EXAM: PORTABLE CHEST 1 VIEW COMPARISON:  Chest radiograph 12/02/2020. FINDINGS: Stable cardiac and mediastinal contours. Similar bibasilar heterogeneous opacities. Interval development of patchy consolidation left upper lung. No pleural effusion or pneumothorax. IMPRESSION: Interval development of patchy consolidation left upper lung which may represent pneumonia in the appropriate clinical setting. Bibasilar opacities favored to represent atelectasis. Electronically Signed   By: Annia Belt M.D.   On: 11/26/2020 13:13   DG CHEST PORT 1 VIEW  Result Date: 11/11/2020 CLINICAL DATA:  Stroke EXAM: PORTABLE CHEST 1 VIEW COMPARISON:  None. FINDINGS: The lungs are symmetrically expanded. Mild right basilar atelectasis or infiltrate. No pneumothorax or pleural effusion. Cardiac size within normal limits. The pulmonary vascularity is normal. No acute bone abnormality. IMPRESSION: Mild right basilar atelectasis or infiltrate. Electronically Signed   By: Helyn Numbers MD   On: 11/11/2020 01:48   VAS [place] ABI WITH/WO TBI  Result Date: 11/23/2020 LOWER EXTREMITY DOPPLER STUDY Indications: Peripheral artery disease. High Risk Factors: Hypertension, Diabetes, current smoker, prior CVA.  Comparison Study: No previous exam Performing Technologist: Clint Guy RVT  Examination Guidelines: A complete evaluation includes at minimum, Doppler waveform signals and systolic blood pressure reading at the level of  bilateral brachial, anterior tibial, and posterior tibial arteries, when vessel segments are accessible. Bilateral testing is considered an integral part of a complete examination. Photoelectric Plethysmograph (PPG) waveforms and toe systolic pressure readings are included as required and additional duplex testing as needed. Limited examinations for reoccurring indications may be performed as noted.  ABI Findings: +--------+------------------+-----+---------+--------+ Right   Rt Pressure (mmHg)IndexWaveform Comment  +--------+------------------+-----+---------+--------+ [id number]                    triphasic         +--------+------------------+-----+---------+--------+ PTA     108               0.73 biphasic          +--------+------------------+-----+---------+--------+ PERO    126               0.85 biphasic          +--------+------------------+-----+---------+--------+ +--------+------------------+-----+---------+-------+ Left    Lt Pressure (mmHg)IndexWaveform Comment +--------+------------------+-----+---------+-------+ [id number]                    triphasic        +--------+------------------+-----+---------+-------+ PTA     130               0.88 biphasic         +--------+------------------+-----+---------+-------+ PERO    112               0.76 biphasic         +--------+------------------+-----+---------+-------+ +-------+-----------+-----------+------------+------------+ ABI/TBIToday's ABIToday's TBIPrevious ABIPrevious TBI +-------+-----------+-----------+------------+------------+ Right  0.85                                           +-------+-----------+-----------+------------+------------+  Left   0.88                                           +-------+-----------+-----------+------------+------------+  Summary: Right: Resting right ankle-brachial index indicates mild right lower extremity arterial disease. Left: Resting left  ankle-brachial index indicates mild left lower extremity arterial disease.  *See table(s) above for measurements and observations.  Electronically signed by Sherald Hess MD on 11/23/2020 at 6:01:25 PM.   Final    ECHOCARDIOGRAM COMPLETE BUBBLE STUDY  Result Date: 11/06/2020    ECHOCARDIOGRAM REPORT   Patient Name:   Cathy Hall [facility] Date of Exam: 11/06/2020 Medical Rec #:  [id number]                   Height:       61.0 in Accession #:    [id number]                  Weight:       226.9 lb Date of Birth:  Nov 05, 1971                   BSA:          1.993 m Patient Age:    49 years                    BP:           118/75 mmHg Patient Gender: F                           HR:           74 bpm. Exam Location:  Inpatient Procedure: 2D Echo and Saline Contrast Bubble Study Indications:    stroke 434.91  History:        Patient has no prior history of Echocardiogram examinations.                 Risk Factors:Hypertension and cocaine use.  Sonographer:    Delcie Roch Referring Phys: [id number] Deno Lunger SHALHOUB IMPRESSIONS  1. Left ventricular ejection fraction, by estimation, is 55 to 60%. The left ventricle has normal function. The left ventricle has no regional wall motion abnormalities. There is mild left ventricular hypertrophy. Left ventricular diastolic parameters were normal.  2. Right ventricular systolic function is normal. The right ventricular size is normal. Tricuspid regurgitation signal is inadequate for assessing PA pressure.  3. The mitral valve is grossly normal. Trivial mitral valve regurgitation.  4. The aortic valve is tricuspid. Aortic valve regurgitation is not visualized.  5. The inferior vena cava is normal in size with greater than 50% respiratory variability, suggesting right atrial pressure of 3 mmHg.  6. Agitated saline contrast bubble study was negative, with no evidence of any interatrial shunt. FINDINGS  Left Ventricle: Left ventricular ejection fraction, by estimation, is 55  to 60%. The left ventricle has normal function. The left ventricle has no regional wall motion abnormalities. The left ventricular internal cavity size was normal in size. There is  mild left ventricular hypertrophy. Left ventricular diastolic parameters were normal. Right Ventricle: The right ventricular size is normal. No increase in right ventricular wall thickness. Right ventricular systolic function is normal. Tricuspid regurgitation signal is inadequate for assessing PA pressure. Left Atrium: Left atrial size was normal in size. Right Atrium: Right atrial size  was normal in size. Pericardium: There is no evidence of pericardial effusion. Mitral Valve: The mitral valve is grossly normal. Mild mitral annular calcification. Trivial mitral valve regurgitation. Tricuspid Valve: The tricuspid valve is grossly normal. Tricuspid valve regurgitation is trivial. Aortic Valve: The aortic valve is tricuspid. There is mild aortic valve annular calcification. Aortic valve regurgitation is not visualized. Pulmonic Valve: The pulmonic valve was grossly normal. Pulmonic valve regurgitation is trivial. Aorta: The aortic root is normal in size and structure. Venous: The inferior vena cava is normal in size with greater than 50% respiratory variability, suggesting right atrial pressure of 3 mmHg. IAS/Shunts: No atrial level shunt detected by color flow Doppler. Agitated saline contrast was given intravenously to evaluate for intracardiac shunting. Agitated saline contrast bubble study was negative, with no evidence of any interatrial shunt.  LEFT VENTRICLE PLAX 2D LVIDd:         4.10 cm  Diastology LVIDs:         3.00 cm  LV e' medial:    6.85 cm/s LV PW:         1.20 cm  LV E/e' medial:  15.6 LV IVS:        1.10 cm  LV e' lateral:   6.31 cm/s LVOT diam:     1.70 cm  LV E/e' lateral: 17.0 LV SV:         51 LV SV Index:   26 LVOT Area:     2.27 cm  RIGHT VENTRICLE             IVC RV S prime:     14.90 cm/s  IVC diam: 1.10 cm  TAPSE (M-mode): 2.4 cm LEFT ATRIUM             Index       RIGHT ATRIUM           Index LA diam:        4.10 cm 2.06 cm/m  RA Area:     10.70 cm LA Vol (A2C):   55.7 ml 27.95 ml/m RA Volume:   21.40 ml  10.74 ml/m LA Vol (A4C):   61.5 ml 30.86 ml/m LA Biplane Vol: 59.0 ml 29.61 ml/m  AORTIC VALVE LVOT Vmax:   115.00 cm/s LVOT Vmean:  76.600 cm/s LVOT VTI:    0.225 m  AORTA Ao Root diam: 2.70 cm Ao Asc diam:  3.00 cm MITRAL VALVE MV Area (PHT): 3.53 cm     SHUNTS MV Decel Time: 215 msec     Systemic VTI:  0.22 m MV E velocity: 107.00 cm/s  Systemic Diam: 1.70 cm MV A velocity: 79.60 cm/s MV E/A ratio:  1.34 Nona Dell MD Electronically signed by Nona Dell MD Signature Date/Time: 11/06/2020/4:42:30 PM    Final    CT HEAD CODE STROKE WO CONTRAST  Result Date: 11/10/2020 CLINICAL DATA:  Code stroke. Neuro deficit, acute, stroke suspected. Stroke, aphasia, slurred speech, blurry vision, last known well 1900 EXAM: CT HEAD WITHOUT CONTRAST TECHNIQUE: Contiguous axial images were obtained from the base of the skull through the vertex without intravenous contrast. COMPARISON:  Brain MRI 11/05/2020. CT angiogram head/neck 11/05/2020, head CT 11/05/2020. FINDINGS: Brain: Cerebral volume is normal. A known subacute infarct within the posterior left midbrain was better appreciated on the brain MRI of 11/05/2020 (acute at that time). Redemonstrated subacute infarct within the ventral right thalamus. Redemonstrated chronic lacunar infarct within the right pons. Minimal ill-defined hypoattenuation within the cerebral white matter is nonspecific, but compatible  with chronic small vessel ischemic disease. There is no acute intracranial hemorrhage. No demarcated cortical infarct. No extra-axial fluid collection. No evidence of intracranial mass. No midline shift. Vascular: No hyperdense vessel. Skull: Normal. Negative for fracture or focal lesion. Sinuses/Orbits: Visualized orbits show no acute finding. Small left  maxillary sinus mucous retention cysts. ASPECTS ([place] Stroke Program Early CT Score) - Ganglionic level infarction (caudate, lentiform nuclei, internal capsule, insula, M1-M3 cortex): 7 - Supraganglionic infarction (M4-M6 cortex): 3 Total score (0-10 with 10 being normal): 10 These results were called by telephone at the time of interpretation on 11/10/2020 at 8:15 pm to provider Dr. Amada Jupiter, who verbally acknowledged these results. IMPRESSION: A known small subacute infarct within the posterior left midbrain was better appreciated on the prior MRI of 11/05/2020. Redemonstrated small subacute infarct within the ventral right thalamus. No CT evidence of interval acute intracranial abnormality. Redemonstrated chronic infarct within the right pons. Stable minimal cerebral white matter chronic small vessel ischemic disease. Electronically Signed   By: Jackey Loge DO   On: 11/10/2020 20:16   CT HEAD CODE STROKE WO CONTRAST  Result Date: 11/05/2020 CLINICAL DATA:  Code stroke. Acute neuro deficit. Left facial droop headache slurred speech EXAM: CT HEAD WITHOUT CONTRAST TECHNIQUE: Contiguous axial images were obtained from the base of the skull through the vertex without intravenous contrast. COMPARISON:  None. FINDINGS: Brain: No evidence of acute infarction, hemorrhage, hydrocephalus, extra-axial collection or mass lesion/mass effect. Vascular: Negative for hyperdense vessel Skull: Negative Sinuses/Orbits: Mild mucosal edema left maxillary sinus otherwise clear. Normal orbit Other: None ASPECTS ([place] Stroke Program Early CT Score) - Ganglionic level infarction (caudate, lentiform nuclei, internal capsule, insula, M1-M3 cortex): 7 - Supraganglionic infarction (M4-M6 cortex): 3 Total score (0-10 with 10 being normal): 10 IMPRESSION: 1. Negative CT of the brain 2. ASPECTS is 10 3. Code stroke imaging results were communicated on 11/05/2020 at 6:26 pm to provider Lindzen via text page Electronically Signed   By:  Marlan Palau M.D.   On: 11/05/2020 18:27   DG Knee 3 Views Right  Result Date: 11/22/2020 CLINICAL DATA:  Right knee ACL tear.  MRI clearance. EXAM: RIGHT KNEE - 3 VIEW COMPARISON:  07/17/2020. FINDINGS: Small bony density noted adjacent to the medial femoral condyle consistent with old medial collateral ligamentous injury. Similar finding noted on prior study of 07/17/2020. Small loose body may be present over the midportion of the femoral tibial joint space. No acute bony or joint abnormality identified. No knee joint effusion noted. Peripheral vascular calcification. No radiopaque foreign body. IMPRESSION: 1. Small bony density noted adjacent to the medial femoral condyle consistent with old medial collateral ligamentous injury. Similar finding noted on prior study of 07/17/2020. Small loose body may be present over the midportion of the femoral tibial joint space. No acute bony abnormality identified. 2.  Peripheral vascular disease. 3.  No radiopaque foreign body. Electronically Signed   By: Maisie Fus  Register   On: 11/22/2020 14:02    Subjective: Seen and examined at bedside and she is doing fairly well.  No nausea or vomiting.  Feels much better than she did the last few days.  Ready to go home and stable for discharge at home given that PT OT now recommending home health services.  She will need to follow-up with PCP, neurology as well as orthopedic surgery in the outpatient setting  Discharge Exam: Vitals:   11/30/20 0832 11/30/20 1245  BP:  128/62  Pulse:  86  Resp:  16  Temp:  98.6 F (37 C)  SpO2: 96% 100%   Vitals:   11/30/20 0818 11/30/20 0831 11/30/20 0832 11/30/20 1245  BP: 113/65   128/62  Pulse: 77   86  Resp: 16   16  Temp: 98.2 F (36.8 C)   98.6 F (37 C)  TempSrc: Oral   Oral  SpO2: 97% 96% 96% 100%  Weight:      Height:       General: Pt is alert, awake, not in acute distress Cardiovascular: RRR, S1/S2 +, no rubs, no gallops Respiratory: Diminished  bilaterally, no wheezing, no rhonchi Abdominal: Soft, NT, Distended 2/2 body habitus, bowel sounds + Extremities: trace edema, no cyanosis  The results of significant diagnostics from this hospitalization (including imaging, microbiology, ancillary and laboratory) are listed below for reference.    Microbiology: Recent Results (from the past 240 hour(s))  SARS Coronavirus 2 by RT PCR (hospital order, performed in [facility] hospital lab) Nasopharyngeal Nasopharyngeal Swab     Status: None   Collection Time: 11/26/20  9:13 AM   Specimen: Nasopharyngeal Swab  Result Value Ref Range Status   SARS Coronavirus 2 NEGATIVE NEGATIVE Final    Comment: (NOTE) SARS-CoV-2 target nucleic acids are NOT DETECTED.  The SARS-CoV-2 RNA is generally detectable in upper and lower respiratory specimens during the acute phase of infection. The lowest concentration of SARS-CoV-2 viral copies this assay can detect is 250 copies / mL. A negative result does not preclude SARS-CoV-2 infection and should not be used as the sole basis for treatment or other patient management decisions.  A negative result may occur with improper specimen collection / handling, submission of specimen other than nasopharyngeal swab, presence of viral mutation(s) within the areas targeted by this assay, and inadequate number of viral copies (<250 copies / mL). A negative result must be combined with clinical observations, patient history, and epidemiological information.  Fact Sheet for Patients:   BoilerBrush.com.cy  Fact Sheet for Healthcare Providers: https://pope.com/  This test is not yet approved or  cleared by the [place] FDA and has been authorized for detection and/or diagnosis of SARS-CoV-2 by FDA under an Emergency Use Authorization (EUA).  This EUA will remain in effect (meaning this test can be used) for the duration of the COVID-19 declaration under Section  564(b)(1) of the Act, 21 U.S.C. section 360bbb-3(b)(1), unless the authorization is terminated or revoked sooner.  Performed at [facility], [address], [place], [place] [address]   Urine Culture     Status: None   Collection Time: 11/28/20  8:24 AM   Specimen: Urine, Clean Catch  Result Value Ref Range Status   Specimen Description URINE, CLEAN CATCH  Final   Special Requests Normal  Final   Culture   Final    NO GROWTH Performed at [facility], [address], [place], [place] [address]    Report Status 11/29/2020 FINAL  Final  Culture, blood (Routine X 2) w Reflex to ID Panel     Status: None (Preliminary result)   Collection Time: 11/28/20  8:58 AM   Specimen: BLOOD RIGHT HAND  Result Value Ref Range Status   Specimen Description BLOOD RIGHT HAND  Final   Special Requests   Final    BOTTLES DRAWN AEROBIC AND ANAEROBIC Blood Culture results may not be optimal due to an inadequate volume of blood received in culture bottles   Culture   Final    NO GROWTH 2 DAYS  Performed at [facility], [address] [place], [place] [address]    Report Status PENDING  Incomplete  Culture, blood (Routine X 2) w Reflex to ID Panel     Status: None (Preliminary result)   Collection Time: 11/28/20  9:03 AM   Specimen: BLOOD RIGHT HAND  Result Value Ref Range Status   Specimen Description BLOOD RIGHT HAND  Final   Special Requests   Final    BOTTLES DRAWN AEROBIC AND ANAEROBIC Blood Culture results may not be optimal due to an inadequate volume of blood received in culture bottles   Culture   Final    NO GROWTH 2 DAYS Performed at [facility], [address], [place], [place] [address]    Report Status PENDING  Incomplete    Labs: BNP (last 3 results) Recent Labs    11/24/20 0813  BNP 58.2   Basic Metabolic Panel: Recent Labs  Lab 11/24/20 0813 11/25/20 0430 11/27/20 0124 11/30/20 0906  NA 139 138 136 138  K 4.4 4.9 4.2 4.1  CL 101 99 97* 100   CO2 25 24 29 26   GLUCOSE 136* 124* 174* 155*  BUN 15 16 18 16   CREATININE 0.77 0.71 0.93 0.90  CALCIUM 8.7* 8.9 9.2 9.3  MG  --   --   --  1.7  PHOS  --   --   --  3.9   Liver Function Tests: Recent Labs  Lab 11/30/20 0906  AST 25  ALT 27  ALKPHOS 65  BILITOT 0.6  PROT 7.2  ALBUMIN 3.4*   No results for input(s): LIPASE, AMYLASE in the last 168 hours. No results for input(s): AMMONIA in the last 168 hours. CBC: Recent Labs  Lab 11/27/20 0124 11/29/20 0757 11/30/20 0906  WBC 9.0 8.1 9.2  NEUTROABS  --   --  5.0  HGB 12.1 12.6 12.9  HCT 37.4 40.6 41.7  MCV 94.9 95.8 95.6  PLT 230 234 281   Cardiac Enzymes: No results for input(s): CKTOTAL, CKMB, CKMBINDEX, TROPONINI in the last 168 hours. BNP: Invalid input(s): POCBNP CBG: Recent Labs  Lab 11/26/20 1848 11/27/20 0026 11/27/20 0632 11/27/20 1201 11/27/20 1929  GLUCAP 173* 144* 159* 140* 191*   D-Dimer No results for input(s): DDIMER in the last 72 hours. Hgb A1c No results for input(s): HGBA1C in the last 72 hours. Lipid Profile No results for input(s): CHOL, HDL, LDLCALC, TRIG, CHOLHDL, LDLDIRECT in the last 72 hours. Thyroid function studies No results for input(s): TSH, T4TOTAL, T3FREE, THYROIDAB in the last 72 hours.  Invalid input(s): FREET3 Anemia work up No results for input(s): VITAMINB12, FOLATE, FERRITIN, TIBC, IRON, RETICCTPCT in the last 72 hours. Urinalysis    Component Value Date/Time   COLORURINE YELLOW 11/28/2020 1106   APPEARANCEUR CLEAR 11/28/2020 1106   LABSPEC 1.013 11/28/2020 1106   PHURINE 6.0 11/28/2020 1106   GLUCOSEU NEGATIVE 11/28/2020 1106   HGBUR SMALL (A) 11/28/2020 1106   BILIRUBINUR NEGATIVE 11/28/2020 1106   KETONESUR NEGATIVE 11/28/2020 1106   PROTEINUR NEGATIVE 11/28/2020 1106   UROBILINOGEN 0.2 08/07/2020 1139   NITRITE NEGATIVE 11/28/2020 1106   LEUKOCYTESUR NEGATIVE 11/28/2020 1106   Sepsis Labs Invalid input(s): PROCALCITONIN,  WBC,   LACTICIDVEN Microbiology Recent Results (from the past 240 hour(s))  SARS Coronavirus 2 by RT PCR (hospital order, performed in [facility] hospital lab) Nasopharyngeal Nasopharyngeal Swab     Status: None   Collection Time: 11/26/20  9:13 AM   Specimen: Nasopharyngeal  Swab  Result Value Ref Range Status   SARS Coronavirus 2 NEGATIVE NEGATIVE Final    Comment: (NOTE) SARS-CoV-2 target nucleic acids are NOT DETECTED.  The SARS-CoV-2 RNA is generally detectable in upper and lower respiratory specimens during the acute phase of infection. The lowest concentration of SARS-CoV-2 viral copies this assay can detect is 250 copies / mL. A negative result does not preclude SARS-CoV-2 infection and should not be used as the sole basis for treatment or other patient management decisions.  A negative result may occur with improper specimen collection / handling, submission of specimen other than nasopharyngeal swab, presence of viral mutation(s) within the areas targeted by this assay, and inadequate number of viral copies (<250 copies / mL). A negative result must be combined with clinical observations, patient history, and epidemiological information.  Fact Sheet for Patients:   BoilerBrush.com.cy  Fact Sheet for Healthcare Providers: https://pope.com/  This test is not yet approved or  cleared by the [place] FDA and has been authorized for detection and/or diagnosis of SARS-CoV-2 by FDA under an Emergency Use Authorization (EUA).  This EUA will remain in effect (meaning this test can be used) for the duration of the COVID-19 declaration under Section 564(b)(1) of the Act, 21 U.S.C. section 360bbb-3(b)(1), unless the authorization is terminated or revoked sooner.  Performed at [facility], [address] [place], [place] [address]   Urine Culture     Status: None   Collection Time: 11/28/20  8:24 AM   Specimen: Urine, Clean  Catch  Result Value Ref Range Status   Specimen Description URINE, CLEAN CATCH  Final   Special Requests Normal  Final   Culture   Final    NO GROWTH Performed at [facility], [address] [place], [place] [address]    Report Status 11/29/2020 FINAL  Final  Culture, blood (Routine X 2) w Reflex to ID Panel     Status: None (Preliminary result)   Collection Time: 11/28/20  8:58 AM   Specimen: BLOOD RIGHT HAND  Result Value Ref Range Status   Specimen Description BLOOD RIGHT HAND  Final   Special Requests   Final    BOTTLES DRAWN AEROBIC AND ANAEROBIC Blood Culture results may not be optimal due to an inadequate volume of blood received in culture bottles   Culture   Final    NO GROWTH 2 DAYS Performed at [facility], [address] [place], [place] [address]    Report Status PENDING  Incomplete  Culture, blood (Routine X 2) w Reflex to ID Panel     Status: None (Preliminary result)   Collection Time: 11/28/20  9:03 AM   Specimen: BLOOD RIGHT HAND  Result Value Ref Range Status   Specimen Description BLOOD RIGHT HAND  Final   Special Requests   Final    BOTTLES DRAWN AEROBIC AND ANAEROBIC Blood Culture results may not be optimal due to an inadequate volume of blood received in culture bottles   Culture   Final    NO GROWTH 2 DAYS Performed at [facility], [address], [place], [place] [address]    Report Status PENDING  Incomplete    Time coordinating discharge: 35 minutes  SIGNED:  Merlene Laughter, DO [facility] 11/30/2020, 8:43 PM Pager is on AMION  If 7PM-7AM, please contact night-coverage [email or web address]

## 2020-12-03 LAB — CULTURE, BLOOD (ROUTINE X 2)
Culture: NO GROWTH
Culture: NO GROWTH

## 2020-12-05 ENCOUNTER — Inpatient Hospital Stay: Payer: Self-pay | Admitting: Family Medicine

## 2020-12-23 ENCOUNTER — Telehealth (HOSPITAL_COMMUNITY): Payer: Self-pay

## 2020-12-23 NOTE — Telephone Encounter (Signed)
Transitions of Care Pharmacy   Call attempted for a pharmacy transitions of care follow-up. HIPAA appropriate voicemail was left with call back information provided. Also spoke with caretaker on call attempt #2 to let patient know we were trying to reach her   Call attempt #3. Will not follow up again.

## 2020-12-29 ENCOUNTER — Inpatient Hospital Stay: Payer: Self-pay | Admitting: Internal Medicine

## 2021-01-17 ENCOUNTER — Encounter (HOSPITAL_COMMUNITY): Payer: No Payment, Other | Admitting: Psychiatry

## 2021-07-15 ENCOUNTER — Emergency Department (HOSPITAL_COMMUNITY)
Admission: EM | Admit: 2021-07-15 | Discharge: 2021-07-16 | Disposition: A | Discharge status: Home / Self Care | Payer: Self-pay | Attending: Emergency Medicine | Admitting: Emergency Medicine

## 2021-07-15 ENCOUNTER — Encounter (HOSPITAL_COMMUNITY): Payer: Self-pay | Admitting: Emergency Medicine

## 2021-07-15 ENCOUNTER — Other Ambulatory Visit: Payer: Self-pay

## 2021-07-15 ENCOUNTER — Emergency Department (HOSPITAL_COMMUNITY): Payer: Self-pay

## 2021-07-15 DIAGNOSIS — I10 Essential (primary) hypertension: Secondary | ICD-10-CM | POA: Insufficient documentation

## 2021-07-15 DIAGNOSIS — F17210 Nicotine dependence, cigarettes, uncomplicated: Secondary | ICD-10-CM | POA: Insufficient documentation

## 2021-07-15 DIAGNOSIS — J45909 Unspecified asthma, uncomplicated: Secondary | ICD-10-CM | POA: Insufficient documentation

## 2021-07-15 DIAGNOSIS — S92902A Unspecified fracture of left foot, initial encounter for closed fracture: Secondary | ICD-10-CM

## 2021-07-15 DIAGNOSIS — J449 Chronic obstructive pulmonary disease, unspecified: Secondary | ICD-10-CM | POA: Insufficient documentation

## 2021-07-15 DIAGNOSIS — Z7902 Long term (current) use of antithrombotics/antiplatelets: Secondary | ICD-10-CM | POA: Insufficient documentation

## 2021-07-15 DIAGNOSIS — S92322A Displaced fracture of second metatarsal bone, left foot, initial encounter for closed fracture: Secondary | ICD-10-CM | POA: Insufficient documentation

## 2021-07-15 DIAGNOSIS — Z7982 Long term (current) use of aspirin: Secondary | ICD-10-CM | POA: Insufficient documentation

## 2021-07-15 DIAGNOSIS — W208XXA Other cause of strike by thrown, projected or falling object, initial encounter: Secondary | ICD-10-CM | POA: Insufficient documentation

## 2021-07-15 DIAGNOSIS — Z79899 Other long term (current) drug therapy: Secondary | ICD-10-CM | POA: Insufficient documentation

## 2021-07-15 IMAGING — CR DG KNEE 1-2V*R*
2 series · 2 of 2 positions shown · non-contrast
Comparison: [DATE]

CLINICAL DATA: Injury pain

EXAM:
RIGHT KNEE - 1-2 VIEW

[knee ap]
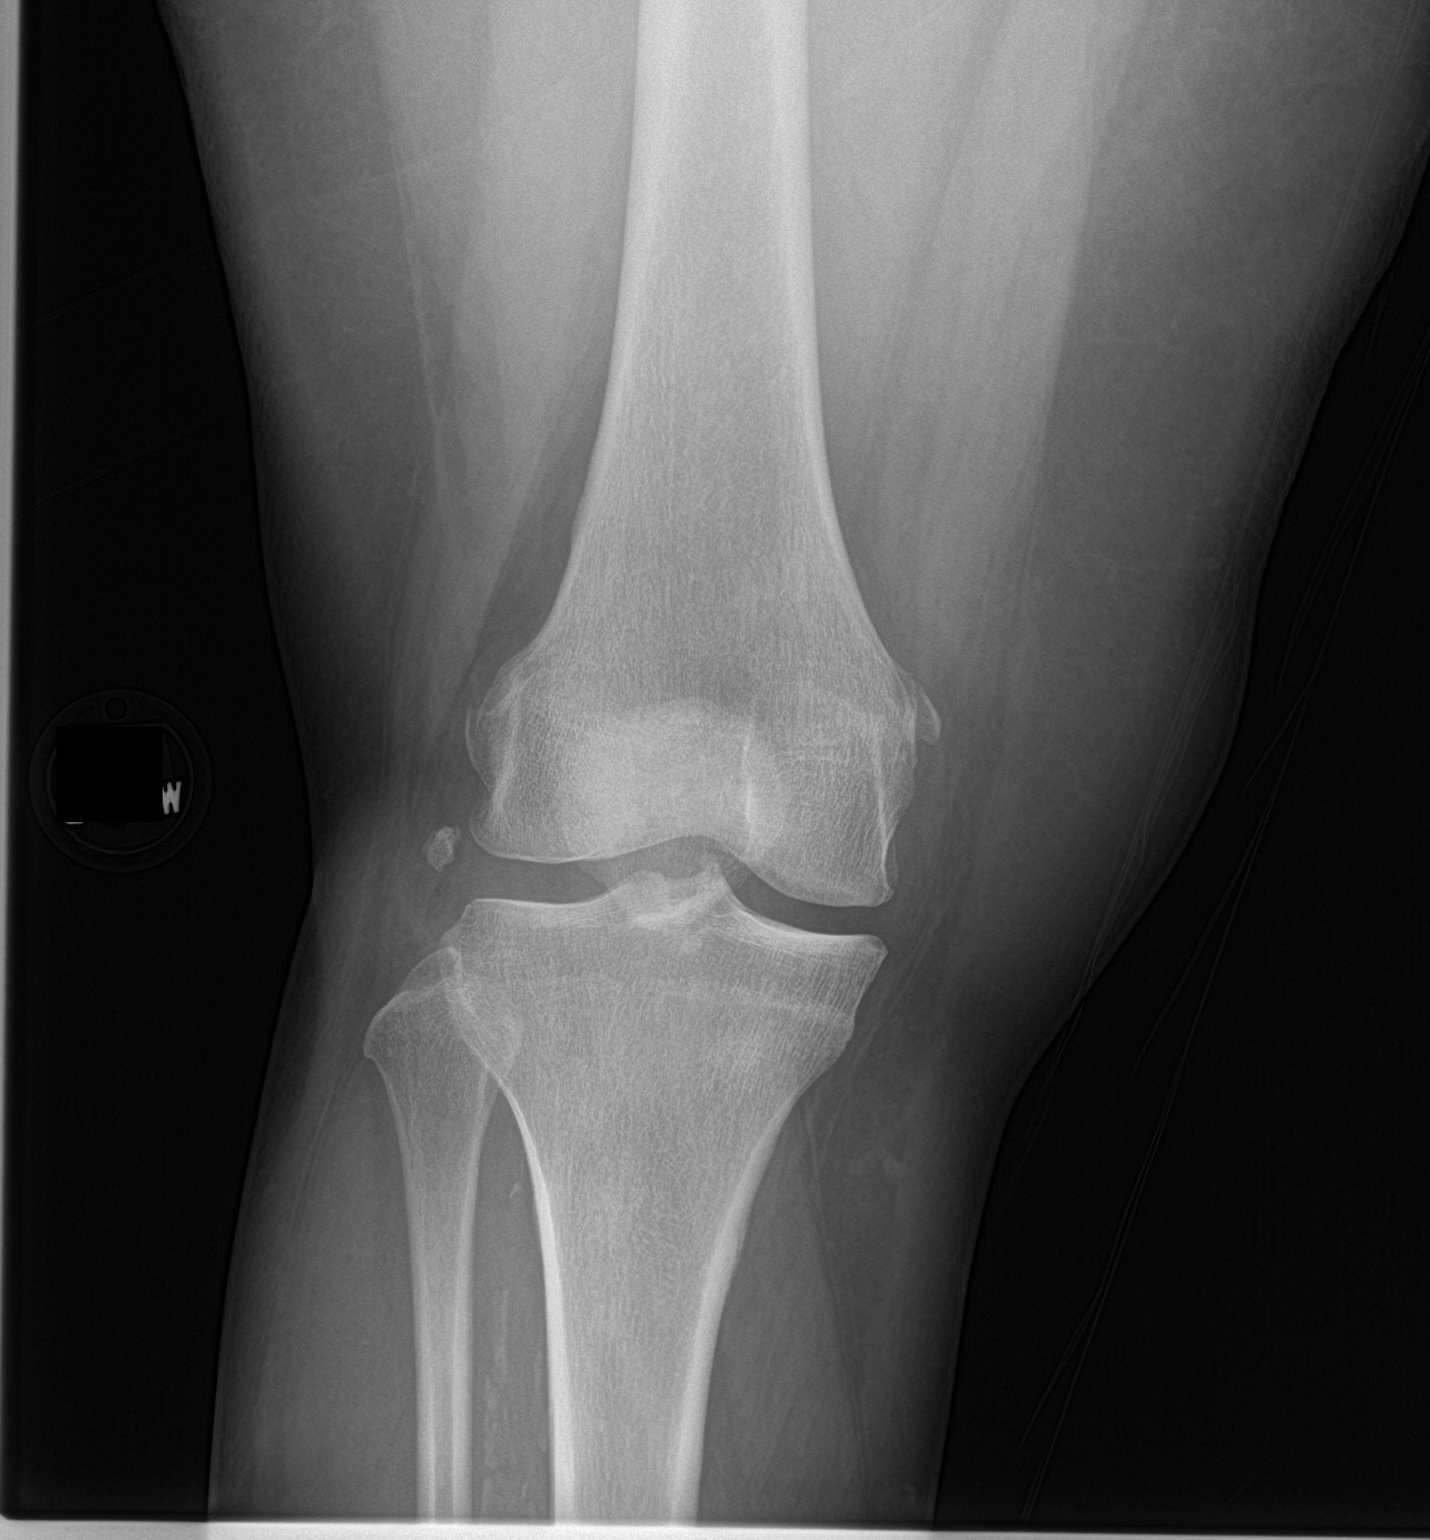

[knee lat]
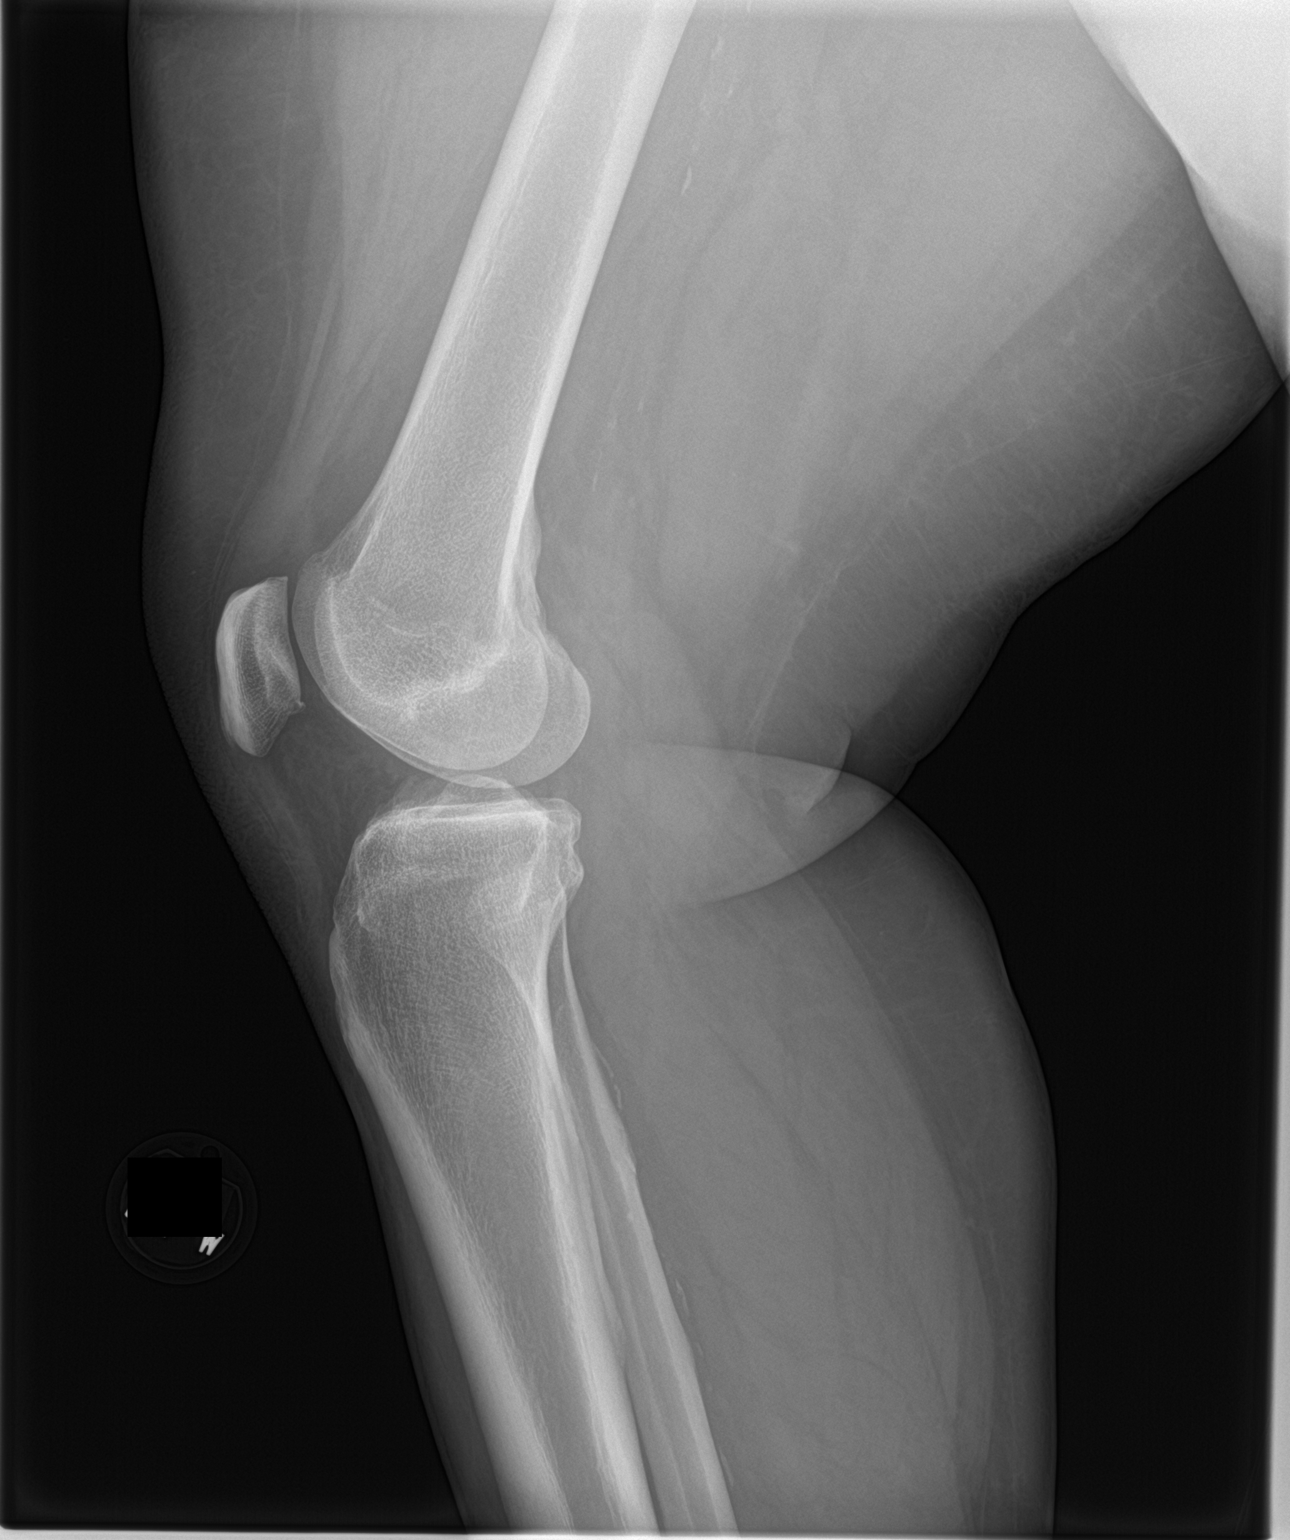

[2 of 2 positions shown; findings below may reference images not displayed]

FINDINGS: No definitive fracture or malalignment. No sizable effusion. Minimal
patellofemoral degenerative change.
IMPRESSION: No acute osseous abnormality

## 2021-07-15 IMAGING — CR DG FOOT COMPLETE 3+V*L*
3 series · 3 of 3 positions shown · non-contrast
Comparison: None.

CLINICAL DATA: Foot injury

EXAM:
LEFT FOOT - COMPLETE 3+ VIEW

[foot ap]
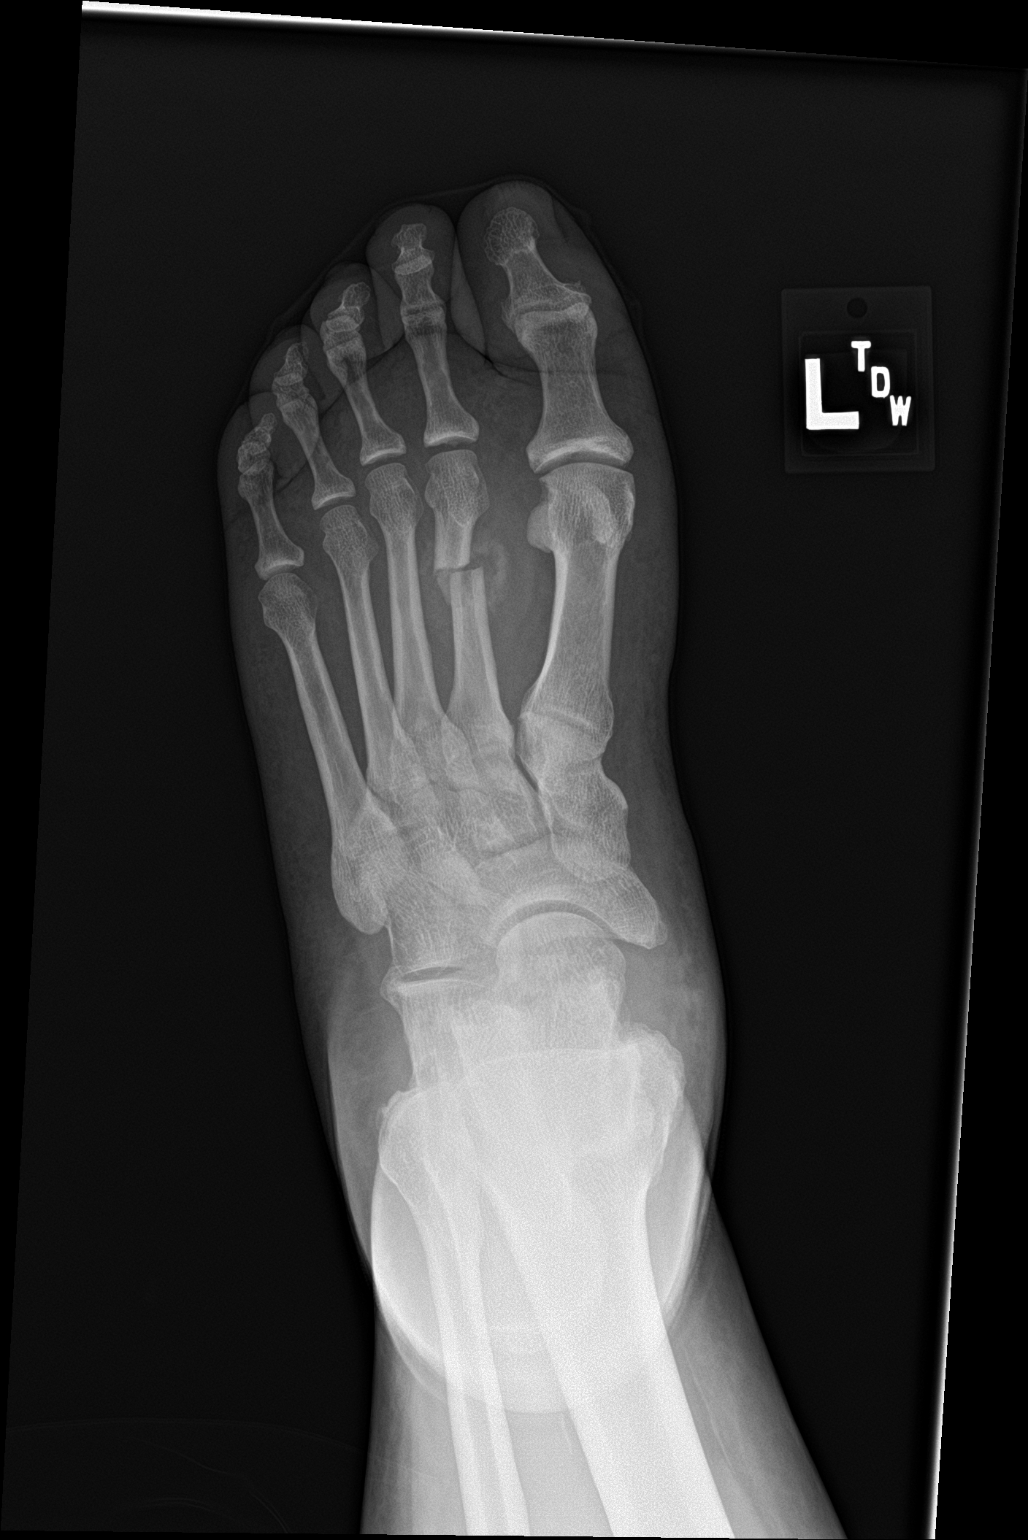

[foot obl]
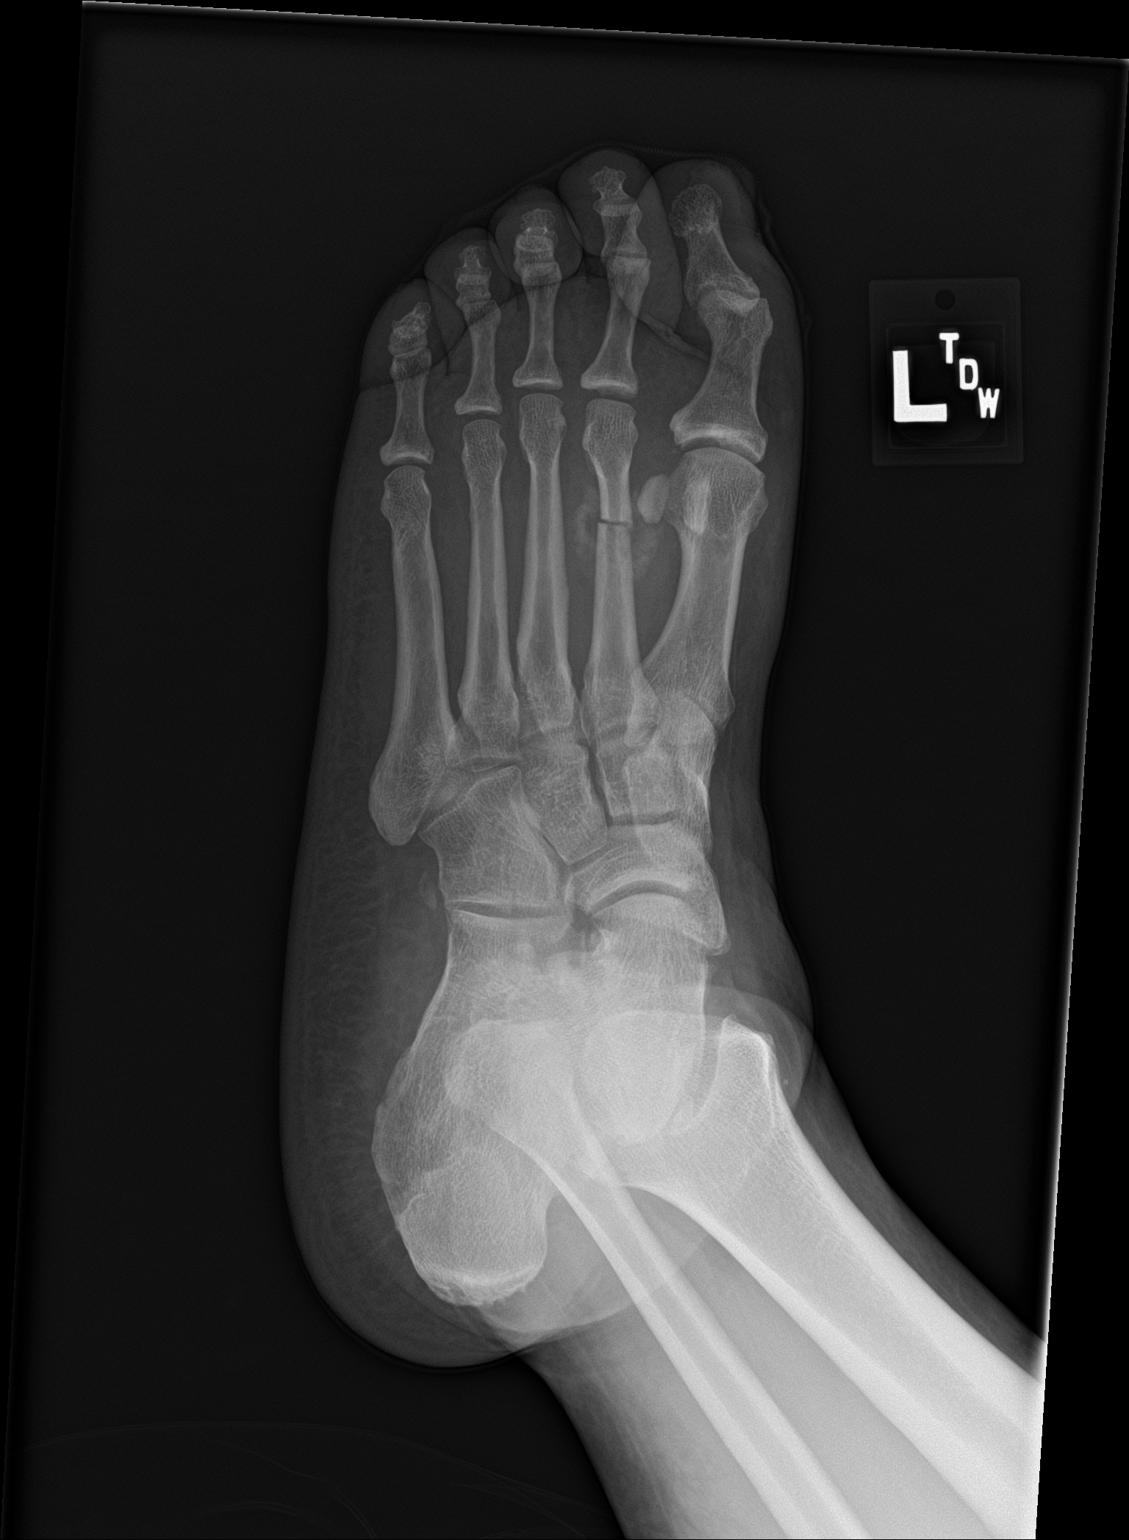

[foot lat]
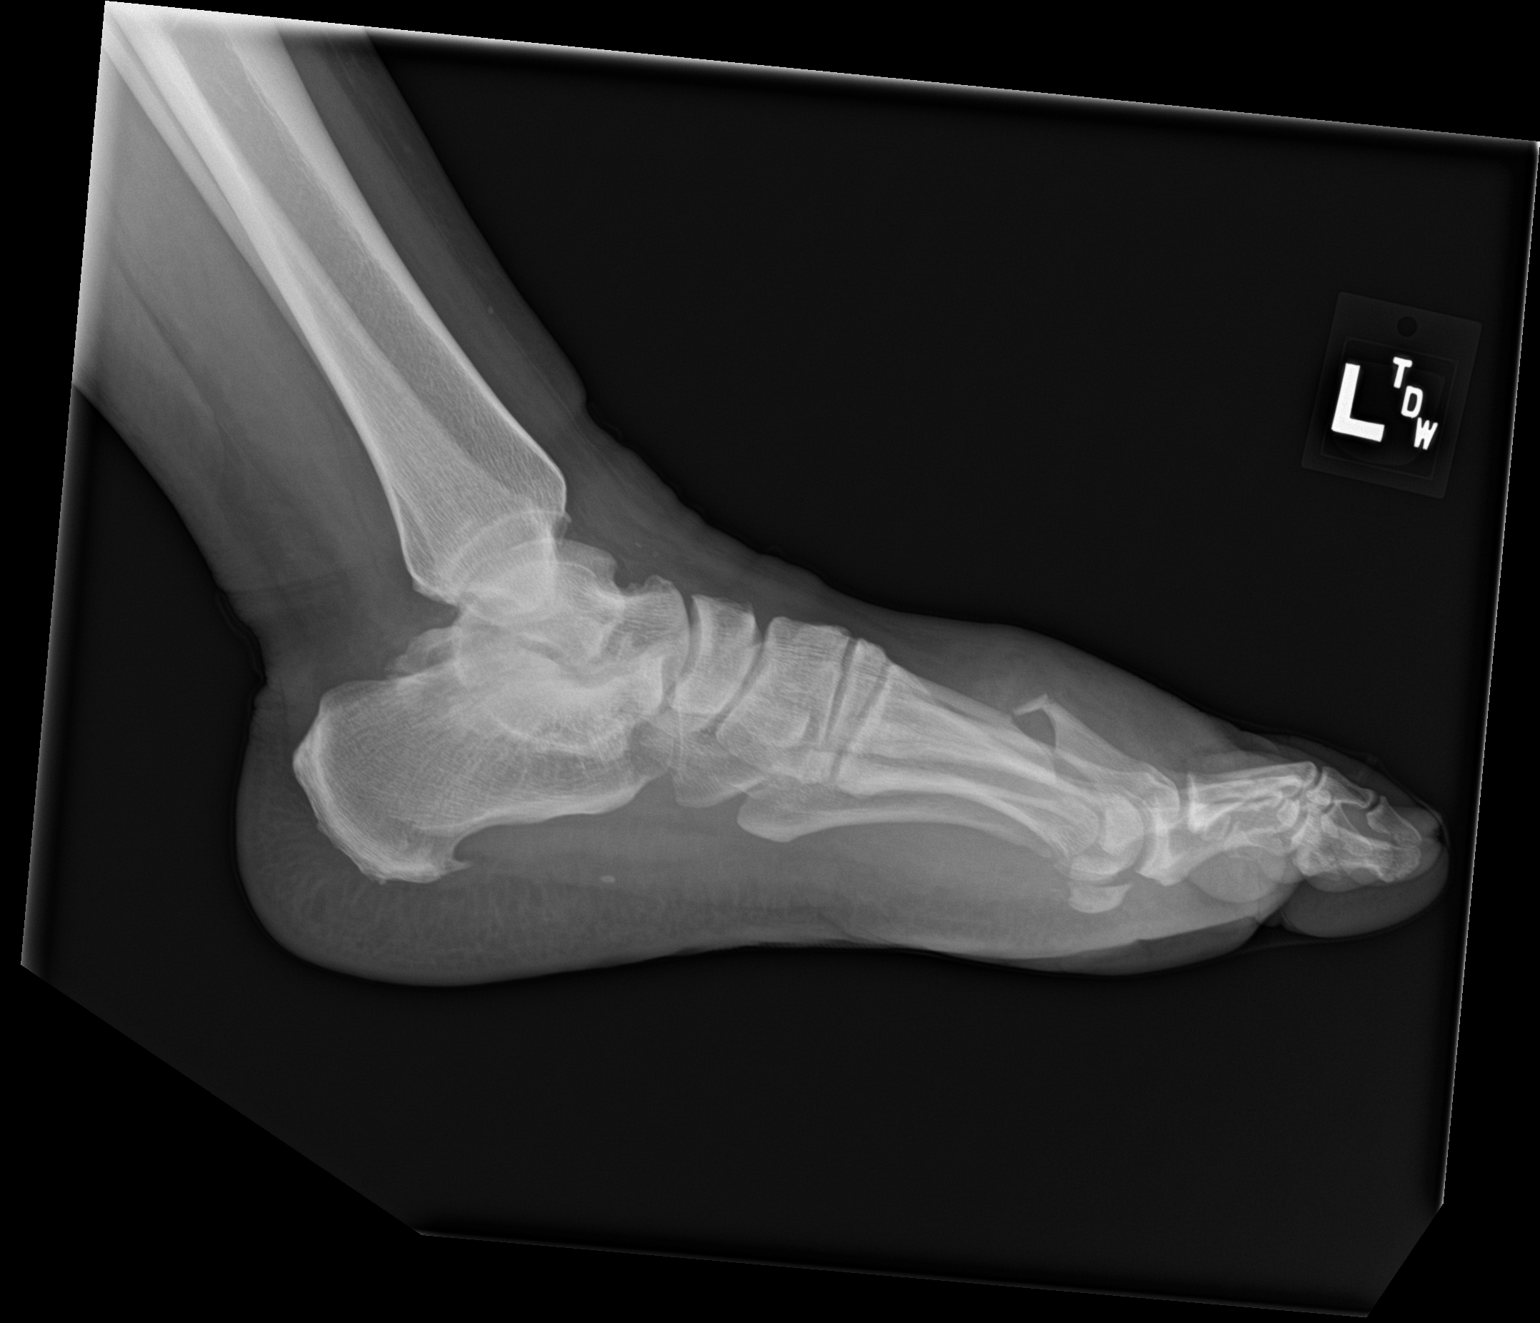

[3 of 3 positions shown; findings below may reference images not displayed]

FINDINGS: Fracture through the distal shaft of the second metatarsal with
slightly greater than [DATE] shaft diameter dorsal displacement of
distal fracture fragment and about [DATE] shaft diameter lateral
displacement of distal fracture fragment. There appears to be early
callus about the fracture suggesting subacute process. 6 mm bone
fragment interposed between the dorsal aspect of main fracture
segments on lateral view.
IMPRESSION: Displaced fracture involving the distal shaft of second metatarsal;
apparent mild callus around the fracture suggesting a subacute
injury

## 2021-07-15 MED ORDER — ACETAMINOPHEN 325 MG PO TABS: 650.0000 mg | ORAL_TABLET | Freq: Once | ORAL | Status: DC

## 2021-07-15 NOTE — ED Notes (Signed)
Pt inquiring about wait time. Pt informed that it does not go in order of time. The pt is requesting the wait time at [facility]. The pt informed that we cannot give out the wait times at another facility. Pt transferred to a recliner chair for comfort at this time.

## 2021-07-15 NOTE — ED Notes (Signed)
No answer in lobby.

## 2021-07-15 NOTE — ED Triage Notes (Signed)
Pt states a table fell on her L foot 1 week ago and it has been discolored.  Ice in place on arrival to triage.  Also reports that when the table fell it broke her brace for her R knee and she is requesting another one.

## 2021-07-16 MED ORDER — OXYCODONE HCL 5 MG PO TABS: 5.0000 mg | ORAL_TABLET | Freq: Four times a day (QID) | ORAL | 0 refills | Status: DC | PRN

## 2021-07-16 MED ORDER — OXYCODONE-ACETAMINOPHEN 5-325 MG PO TABS
2.0000 | ORAL_TABLET | Freq: Once | ORAL | Status: AC
  Administered 2021-07-16: 2 via ORAL
  Filled 2021-07-16: qty 2

## 2021-07-16 NOTE — Discharge Instructions (Addendum)
1. Medications: alternate naprosyn and tylenol for pain control, Roxicodone for severe pain; usual home medications 2. Treatment: rest, ice, elevate and use brace, drink plenty of fluids, gentle stretching 3. Follow Up: Please followup with orthopedics as directed or your PCP in 1 week if no improvement for discussion of your diagnoses and further evaluation after today's visit; Please return to the ER for worsening symptoms or other concerns
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
## 2021-11-20 ENCOUNTER — Other Ambulatory Visit: Payer: Self-pay

## 2021-11-20 ENCOUNTER — Emergency Department (HOSPITAL_COMMUNITY)
Admission: EM | Admit: 2021-11-20 | Discharge: 2021-11-21 | Disposition: A | Discharge status: Home / Self Care | Payer: Self-pay | Attending: Emergency Medicine | Admitting: Emergency Medicine

## 2021-11-20 ENCOUNTER — Emergency Department (HOSPITAL_COMMUNITY): Payer: Self-pay

## 2021-11-20 ENCOUNTER — Ambulatory Visit (HOSPITAL_COMMUNITY)
Admission: EM | Admit: 2021-11-20 | Discharge: 2021-11-20 | Disposition: A | Discharge status: Other Acute Inpatient Hospital | Payer: No Payment, Other | Attending: Urology | Admitting: Urology

## 2021-11-20 ENCOUNTER — Encounter (HOSPITAL_COMMUNITY): Payer: Self-pay | Admitting: Emergency Medicine

## 2021-11-20 DIAGNOSIS — R519 Headache, unspecified: Secondary | ICD-10-CM | POA: Insufficient documentation

## 2021-11-20 DIAGNOSIS — R45851 Suicidal ideations: Secondary | ICD-10-CM

## 2021-11-20 DIAGNOSIS — J449 Chronic obstructive pulmonary disease, unspecified: Secondary | ICD-10-CM | POA: Insufficient documentation

## 2021-11-20 DIAGNOSIS — F32A Depression, unspecified: Secondary | ICD-10-CM | POA: Insufficient documentation

## 2021-11-20 DIAGNOSIS — Z8673 Personal history of transient ischemic attack (TIA), and cerebral infarction without residual deficits: Secondary | ICD-10-CM | POA: Insufficient documentation

## 2021-11-20 DIAGNOSIS — Z79899 Other long term (current) drug therapy: Secondary | ICD-10-CM | POA: Insufficient documentation

## 2021-11-20 DIAGNOSIS — F332 Major depressive disorder, recurrent severe without psychotic features: Secondary | ICD-10-CM

## 2021-11-20 DIAGNOSIS — F4310 Post-traumatic stress disorder, unspecified: Secondary | ICD-10-CM | POA: Diagnosis present

## 2021-11-20 DIAGNOSIS — Z7901 Long term (current) use of anticoagulants: Secondary | ICD-10-CM | POA: Insufficient documentation

## 2021-11-20 DIAGNOSIS — I10 Essential (primary) hypertension: Secondary | ICD-10-CM | POA: Insufficient documentation

## 2021-11-20 DIAGNOSIS — F1410 Cocaine abuse, uncomplicated: Secondary | ICD-10-CM | POA: Insufficient documentation

## 2021-11-20 DIAGNOSIS — T465X6A Underdosing of other antihypertensive drugs, initial encounter: Secondary | ICD-10-CM | POA: Insufficient documentation

## 2021-11-20 DIAGNOSIS — F333 Major depressive disorder, recurrent, severe with psychotic symptoms: Secondary | ICD-10-CM | POA: Diagnosis present

## 2021-11-20 DIAGNOSIS — R42 Dizziness and giddiness: Secondary | ICD-10-CM | POA: Insufficient documentation

## 2021-11-20 DIAGNOSIS — R531 Weakness: Secondary | ICD-10-CM | POA: Insufficient documentation

## 2021-11-20 DIAGNOSIS — Z20822 Contact with and (suspected) exposure to covid-19: Secondary | ICD-10-CM | POA: Insufficient documentation

## 2021-11-20 DIAGNOSIS — R450 Nervousness: Secondary | ICD-10-CM | POA: Insufficient documentation

## 2021-11-20 DIAGNOSIS — Z9114 Patient's other noncompliance with medication regimen: Secondary | ICD-10-CM | POA: Insufficient documentation

## 2021-11-20 DIAGNOSIS — Z7982 Long term (current) use of aspirin: Secondary | ICD-10-CM | POA: Insufficient documentation

## 2021-11-20 LAB — CBC
HCT: 46.7 % — ABNORMAL HIGH (ref 36.0–46.0)
Hemoglobin: 14.8 g/dL (ref 12.0–15.0)
MCH: 29.4 pg (ref 26.0–34.0)
MCHC: 31.7 g/dL (ref 30.0–36.0)
MCV: 92.7 fL (ref 80.0–100.0)
Platelets: 228 10*3/uL (ref 150–400)
RBC: 5.04 MIL/uL (ref 3.87–5.11)
RDW: 14 % (ref 11.5–15.5)
WBC: 9.7 10*3/uL (ref 4.0–10.5)
nRBC: 0 % (ref 0.0–0.2)

## 2021-11-20 LAB — COMPREHENSIVE METABOLIC PANEL
ALT: 14 U/L (ref 0–44)
AST: 21 U/L (ref 15–41)
Albumin: 3.8 g/dL (ref 3.5–5.0)
Alkaline Phosphatase: 73 U/L (ref 38–126)
Anion gap: 11 (ref 5–15)
BUN: 12 mg/dL (ref 6–20)
CO2: 23 mmol/L (ref 22–32)
Calcium: 9.6 mg/dL (ref 8.9–10.3)
Chloride: 104 mmol/L (ref 98–111)
Creatinine, Ser: 0.98 mg/dL (ref 0.44–1.00)
GFR, Estimated: >60 mL/min (ref >60)
Glucose, Bld: 107 mg/dL — ABNORMAL HIGH (ref 70–99)
Potassium: 3.6 mmol/L (ref 3.5–5.1)
Sodium: 138 mmol/L (ref 135–145)
Total Bilirubin: 0.8 mg/dL (ref 0.3–1.2)
Total Protein: 7.3 g/dL (ref 6.5–8.1)

## 2021-11-20 LAB — RESP PANEL BY RT-PCR (FLU A&B, COVID) ARPGX2
Influenza A by PCR: NEGATIVE
Influenza B by PCR: NEGATIVE
SARS Coronavirus 2 by RT PCR: NEGATIVE

## 2021-11-20 LAB — I-STAT BETA HCG BLOOD, ED (MC, WL, AP ONLY): I-stat hCG, quantitative: <5 m[IU]/mL (ref <5)

## 2021-11-20 LAB — SALICYLATE LEVEL: Salicylate Lvl: <7 mg/dL — ABNORMAL LOW (ref 7.0–30.0)

## 2021-11-20 LAB — ETHANOL: Alcohol, Ethyl (B): <10 mg/dL (ref <10)

## 2021-11-20 LAB — CBG MONITORING, ED: Glucose-Capillary: 133 mg/dL — ABNORMAL HIGH (ref 70–99)

## 2021-11-20 LAB — ACETAMINOPHEN LEVEL: Acetaminophen (Tylenol), Serum: <10 ug/mL — ABNORMAL LOW (ref 10–30)

## 2021-11-20 LAB — GLUCOSE, CAPILLARY: Glucose-Capillary: 113 mg/dL — ABNORMAL HIGH (ref 70–99)

## 2021-11-20 IMAGING — CT CT HEAD W/O CM
4 series · 16 of 47 positions shown, 18 images · non-contrast
Comparison: Brain MRI [DATE]. Brain MRI [DATE].

CLINICAL DATA: Headache, chronic, new features or increased
frequency.



[Series 3: head without · axial · non-contrast · 0.44mm/px · z∈[+1044,+1164]mm · 7 of 34 slices shown, 9 images]
[im 5/34  brain]
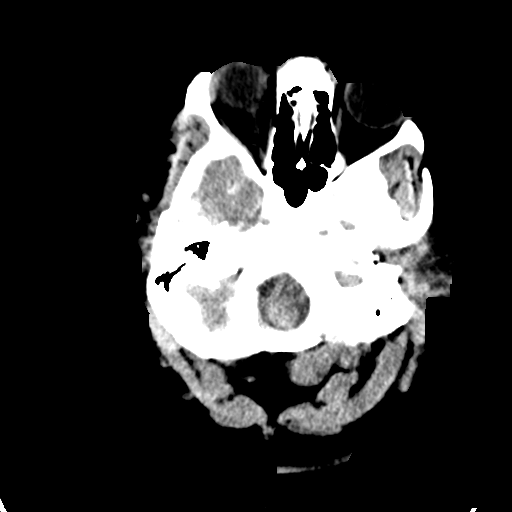
[im 5/34  bone]
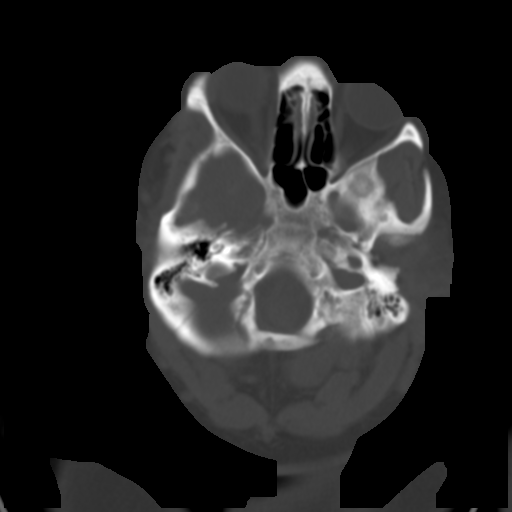
[im 9/34  brain]
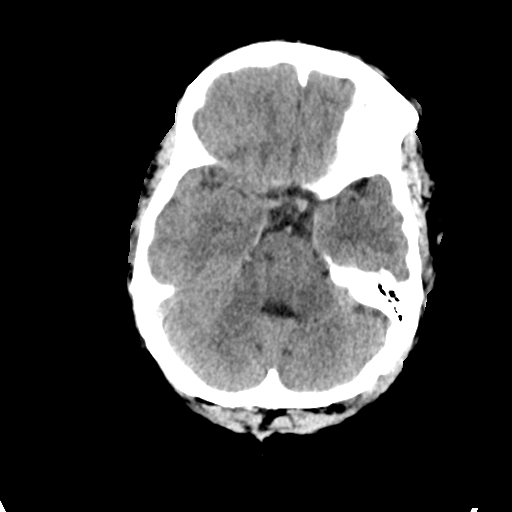
[im 13/34  brain]
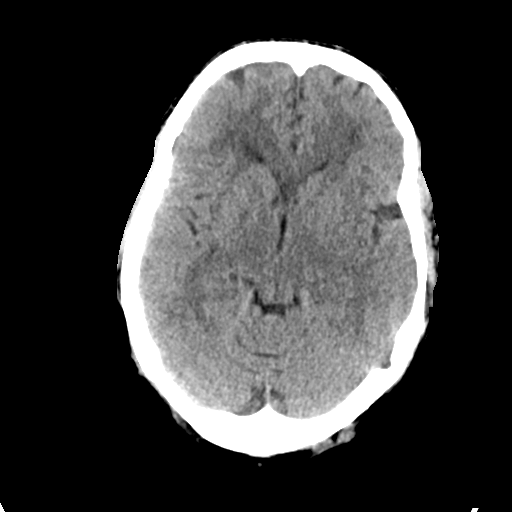
[im 17/34  brain]
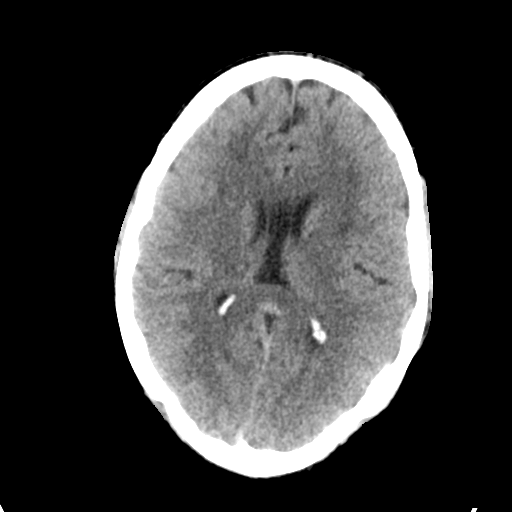
[im 21/34  brain]
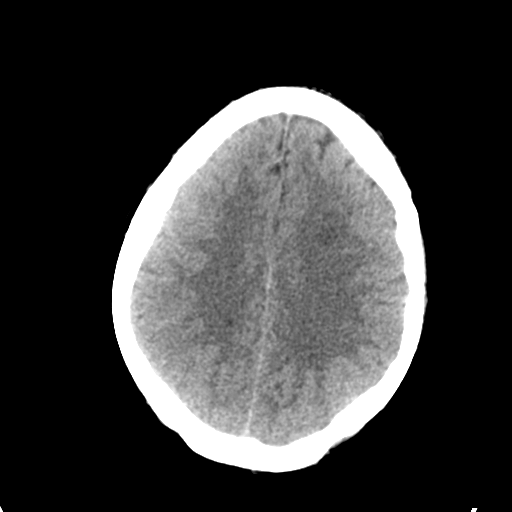
[im 21/34  bone]
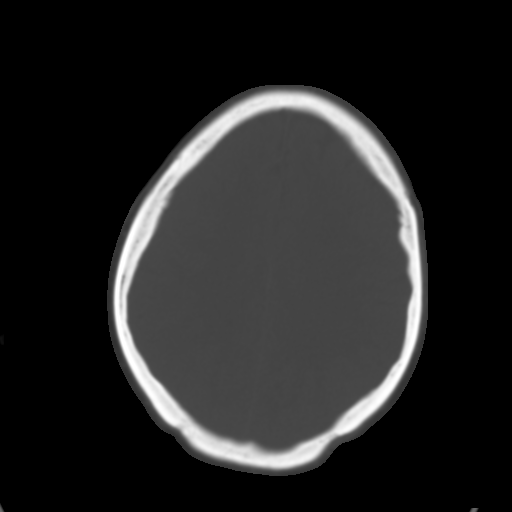
[im 25/34  brain]
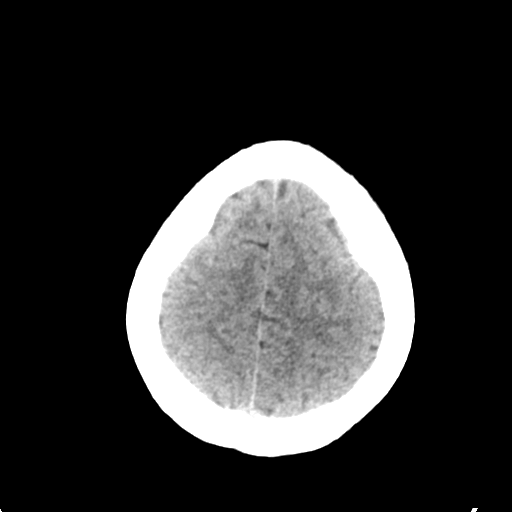
[im 29/34  brain]
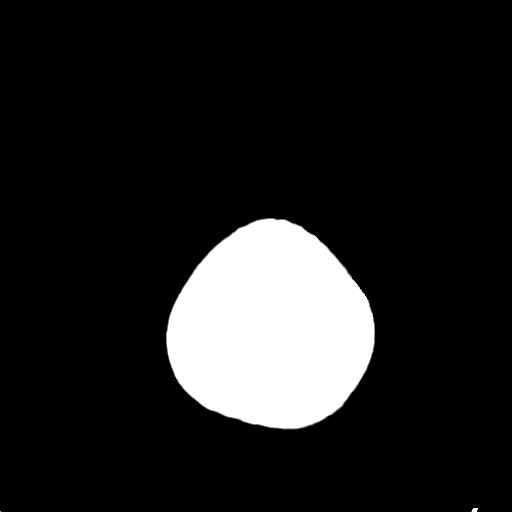

[Series 4: head bone · axial · 0.44mm/px · z∈[+1040,+1074]mm · 3 of 85 slices shown]
[im 9/85  bone]
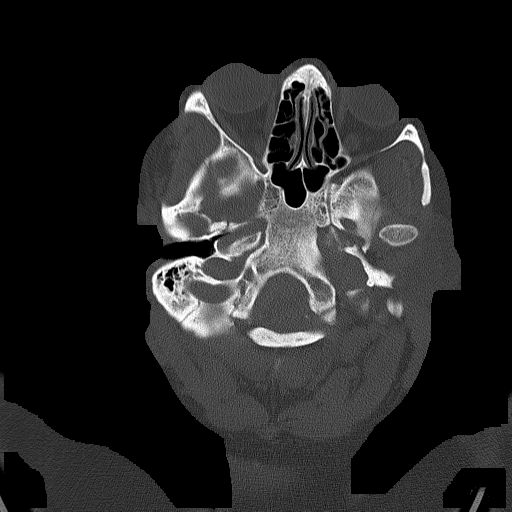
[im 17/85  bone]
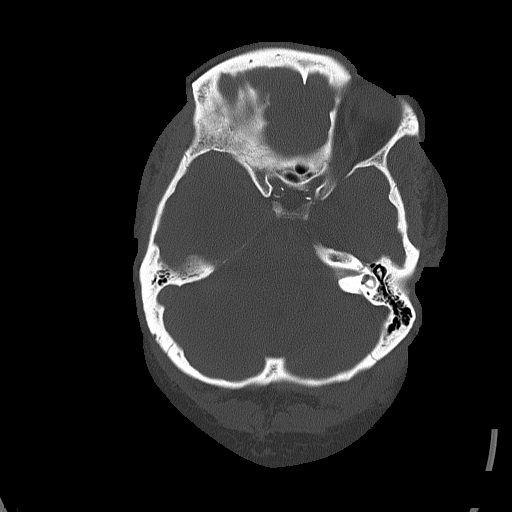
[im 26/85  bone]
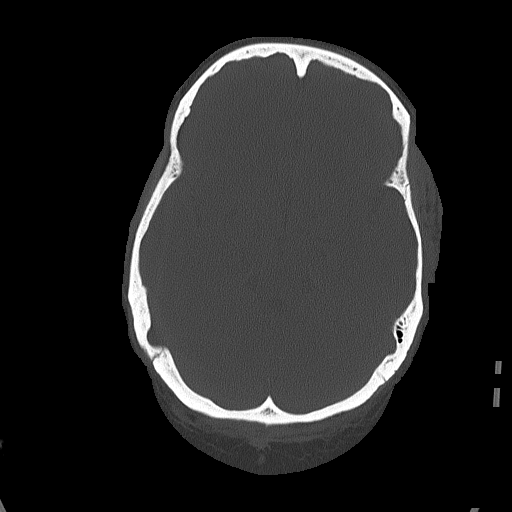

[Series 5: head without cor · coronal · non-contrast · 0.32mm/px · 3 of 66 slices shown]
[im 26/66  brain]
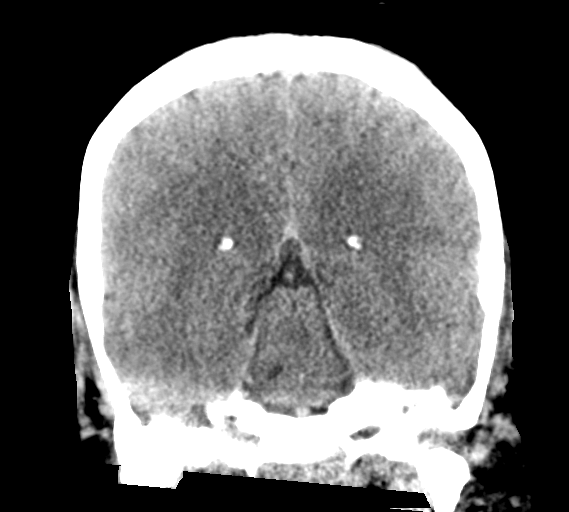
[im 31/66  brain]
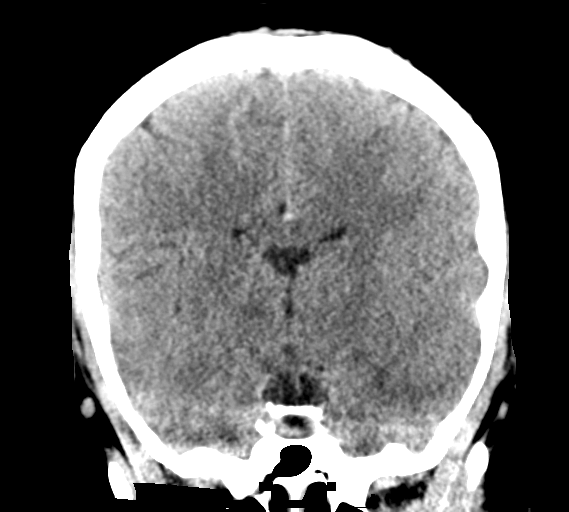
[im 36/66  brain]
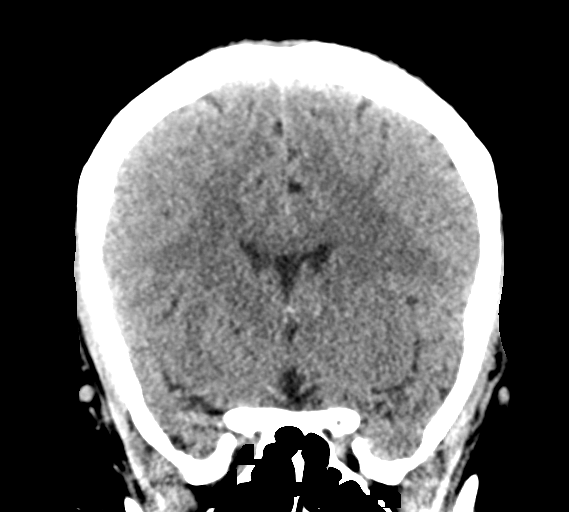

[Series 6: head without sag · sagittal · non-contrast · 0.31mm/px · 3 of 53 slices shown]
[im 18/53  brain]
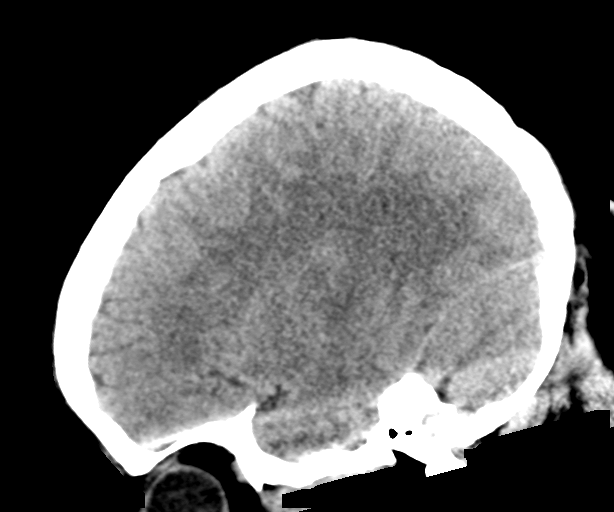
[im 27/53  brain]
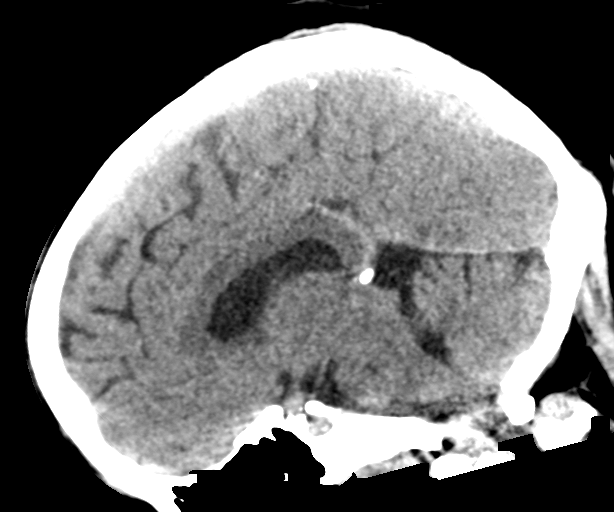
[im 35/53  brain]
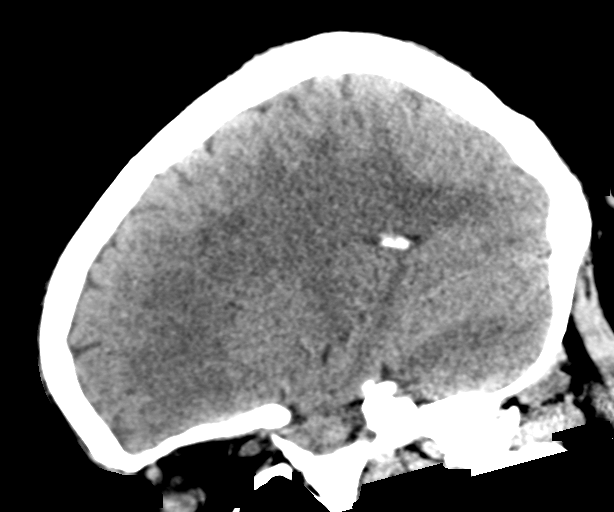

[16 of 47 positions shown; findings below may reference images not displayed]

FINDINGS: Brain:

Cerebral volume is normal.

Known small chronic infarcts within the bilateral cerebral
hemispheric white matter, right thalamus, right pons and
dorsolateral left medulla, some of which are better appreciated on
the prior MRI examinations [DATE] and [DATE].

Background mild chronic small vessel ischemic changes within the
cerebral white matter.

Partially empty sella turcica.

Cavum septum pellucidum and cavum vergae.

There is no acute intracranial hemorrhage.

No demarcated cortical infarct.

No extra-axial fluid collection.

No evidence of an intracranial mass.

No midline shift.

Vascular: No hyperdense vessel.  Atherosclerotic calcifications.

Skull: Normal. Negative for fracture or focal lesion.

Sinuses/Orbits: Visualized orbits show no acute finding. No
significant paranasal sinus disease at the imaged levels.

Other: Trace fluid within the bilateral mastoid air cells.
IMPRESSION: No evidence of acute intracranial abnormality.

Known small chronic infarcts in the bilateral cerebral hemispheric
white matter, right thalamus, right pons and dorsolateral left
medulla, some of which were better appreciated on the prior MRI
examinations of [DATE] and [DATE].

Background mild (but age advanced) cerebral white matter chronic
small vessel ischemic disease.

Trace fluid within the bilateral mastoid air cells.

## 2021-11-20 MED ORDER — CLONIDINE HCL 0.1 MG PO TABS
0.2000 mg | ORAL_TABLET | Freq: Every day | ORAL | Status: DC
  Administered 2021-11-20: 0.2 mg via ORAL
  Filled 2021-11-20: qty 2

## 2021-11-20 MED ORDER — GABAPENTIN 300 MG PO CAPS
300.0000 mg | ORAL_CAPSULE | Freq: Three times a day (TID) | ORAL | Status: DC
  Administered 2021-11-20 – 2021-11-21 (×3): 300 mg via ORAL
  Filled 2021-11-20 (×3): qty 1

## 2021-11-20 MED ORDER — ALBUTEROL SULFATE HFA 108 (90 BASE) MCG/ACT IN AERS: 1.0000 | INHALATION_SPRAY | RESPIRATORY_TRACT | Status: DC | PRN

## 2021-11-20 MED ORDER — POLYVINYL ALCOHOL 1.4 % OP SOLN
1.0000 [drp] | Freq: Two times a day (BID) | OPHTHALMIC | Status: DC | PRN
  Filled 2021-11-20: qty 15

## 2021-11-20 MED ORDER — OLANZAPINE 5 MG PO TABS
5.0000 mg | ORAL_TABLET | Freq: Every day | ORAL | Status: DC
  Administered 2021-11-20: 5 mg via ORAL
  Filled 2021-11-20: qty 1

## 2021-11-20 MED ORDER — FLUOXETINE HCL 20 MG PO CAPS
20.0000 mg | ORAL_CAPSULE | Freq: Every day | ORAL | Status: DC
  Administered 2021-11-20 – 2021-11-21 (×2): 20 mg via ORAL
  Filled 2021-11-20 (×2): qty 1

## 2021-11-20 MED ORDER — ALBUTEROL SULFATE (2.5 MG/3ML) 0.083% IN NEBU: 2.5000 mg | INHALATION_SOLUTION | RESPIRATORY_TRACT | Status: DC | PRN

## 2021-11-20 MED ORDER — MONTELUKAST SODIUM 10 MG PO TABS
10.0000 mg | ORAL_TABLET | Freq: Every day | ORAL | Status: DC
  Administered 2021-11-20: 10 mg via ORAL
  Filled 2021-11-20: qty 1

## 2021-11-20 MED ORDER — MOMETASONE FURO-FORMOTEROL FUM 100-5 MCG/ACT IN AERO
2.0000 | INHALATION_SPRAY | Freq: Two times a day (BID) | RESPIRATORY_TRACT | Status: DC
  Administered 2021-11-20 – 2021-11-21 (×2): 2 via RESPIRATORY_TRACT
  Filled 2021-11-20 (×2): qty 8.8

## 2021-11-20 MED ORDER — ATORVASTATIN CALCIUM 40 MG PO TABS
40.0000 mg | ORAL_TABLET | Freq: Every day | ORAL | Status: DC
  Administered 2021-11-20 – 2021-11-21 (×2): 40 mg via ORAL
  Filled 2021-11-20 (×2): qty 1

## 2021-11-20 MED ORDER — UMECLIDINIUM BROMIDE 62.5 MCG/ACT IN AEPB
1.0000 | INHALATION_SPRAY | Freq: Every day | RESPIRATORY_TRACT | Status: DC
  Administered 2021-11-20 – 2021-11-21 (×2): 1 via RESPIRATORY_TRACT
  Filled 2021-11-20 (×2): qty 7

## 2021-11-20 MED ORDER — ACETAMINOPHEN 325 MG PO TABS
650.0000 mg | ORAL_TABLET | ORAL | Status: DC | PRN
  Administered 2021-11-20 – 2021-11-21 (×2): 650 mg via ORAL
  Filled 2021-11-20 (×2): qty 2

## 2021-11-20 MED ORDER — PANTOPRAZOLE SODIUM 40 MG PO TBEC
40.0000 mg | DELAYED_RELEASE_TABLET | Freq: Every day | ORAL | Status: DC
  Administered 2021-11-20 – 2021-11-21 (×2): 40 mg via ORAL
  Filled 2021-11-20 (×2): qty 1

## 2021-11-20 MED ORDER — CLOPIDOGREL BISULFATE 75 MG PO TABS
75.0000 mg | ORAL_TABLET | Freq: Every day | ORAL | Status: DC
  Administered 2021-11-20 – 2021-11-21 (×2): 75 mg via ORAL
  Filled 2021-11-20 (×2): qty 1

## 2021-11-20 NOTE — ED Notes (Signed)
Belongings placed in locker 11 

## 2021-11-20 NOTE — ED Provider Notes (Signed)
[facility] EMERGENCY DEPARTMENT Provider Note   CSN: [id number] Arrival date & time: 11/20/21  0747     History  Chief Complaint  Patient presents with   Medical Clearance    Cathy Hall is a 51 y.o. female.  Patient sent here from [facility] for medical clearance due to elevated blood pressures.  Per chart review, she has a history of hypertension, COPD, PTSD.  She reports that she is suicidal.  She says that she may overdose.  She was noted to have a markedly elevated blood pressure at rehab in the 200/100 range.  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  She does drink some alcohol but says she has not recently.  She denies any other drug use.  When asked her if she is taking her medications, she says she is taking the ones she has.  It sounds like she is out of her blood pressure medications.  She appears to be sleepy and says this is because she has not been sleeping well recently.  She does report a headache.  She had some vomiting earlier today but no ongoing vomiting.  No chest pain or shortness of breath.      Home Medications Prior to Admission medications   Medication Sig Start Date End Date Taking? Authorizing Provider  acetaminophen (TYLENOL) 325 MG tablet TAKE 2 TABLETS (650 MG TOTAL) BY MOUTH EVERY FOUR HOURS AS NEEDED FOR MILD PAIN (OR TEMP > 37.5 C (99.5 F)). Patient taking differently: Take 650 mg by mouth every 4 (four) hours as needed for mild pain or fever. 11/30/20 11/30/21 Yes Sheikh, Omair Latif, DO  albuterol (VENTOLIN HFA) 108 (90 Base) MCG/ACT inhaler INHALE 2 PUFFS INTO THE LUNGS EVERY FOUR HOURS AS NEEDED FOR WHEEZING OR SHORTNESS OF BREATH. Patient taking differently: Inhale 1-2 puffs into the lungs every 4 (four) hours as needed for wheezing or shortness of breath. 11/30/20 11/30/21 Yes Sheikh, Omair Latif, DO  amLODipine (NORVASC) 10 MG tablet Take 10 mg by mouth daily. 01/29/20  Yes [provider]  aspirin 325 MG EC tablet TAKE  1 TABLET (325 MG TOTAL) BY MOUTH DAILY. Patient taking differently: Take 325 mg by mouth daily. 11/30/20 11/30/21 Yes Sheikh, Omair Latif, DO  atorvastatin (LIPITOR) 40 MG tablet TAKE 1 TABLET (40 MG TOTAL) BY MOUTH DAILY. Patient taking differently: Take 40 mg by mouth daily. 11/30/20 11/30/21 Yes Sheikh, Omair Latif, DO  benzocaine (ORAJEL) 10 % mucosal gel USE AS DIRECTED IN THE MOUTH OR THROAT FOUR TIMES DAILY AS NEEDED FOR MOUTH PAIN. Patient taking differently: Use as directed 1 application in the mouth or throat 4 (four) times daily as needed for mouth pain. 11/30/20 11/30/21 Yes Sheikh, Omair Latif, DO  Benzocaine-Menthol 15-3.6 MG LOZG TAKE 1 LOZENGE (3 MG TOTAL) BY MOUTH AS NEEDED FOR SORE THROAT. Patient taking differently: Take 1 lozenge by mouth 4 (four) times daily as needed (sore throat). 11/30/20 11/30/21 Yes Sheikh, Omair Latif, DO  clopidogrel (PLAVIX) 75 MG tablet TAKE 1 TABLET (75 MG TOTAL) BY MOUTH DAILY. Patient taking differently: Take 75 mg by mouth daily. 11/30/20 11/30/21 Yes Sheikh, Omair Latif, DO  diclofenac Sodium (VOLTAREN) 1 % GEL APPLY 1 APPLICATION TOPICALLY FOUR TIMES DAILY. Patient taking differently: Apply 4 g topically 4 (four) times daily. 11/30/20 11/30/21 Yes Sheikh, Omair Latif, DO  FLUoxetine (PROZAC) 10 MG capsule TAKE 1 CAPSULE (10 MG TOTAL) BY MOUTH DAILY. Patient taking differently: Take 10 mg by mouth daily. 11/30/20 11/30/21 Yes Marguerita Merles  Latif, DO  fluticasone (FLONASE) 50 MCG/ACT nasal spray PLACE 2 SPRAYS INTO BOTH NOSTRILS DAILY. 11/30/20 11/30/21 Yes Sheikh, Omair Latif, DO  gabapentin (NEURONTIN) 300 MG capsule TAKE 1 CAPSULE (300 MG TOTAL) BY MOUTH THREE TIMES DAILY. Patient taking differently: Take 300 mg by mouth 3 (three) times daily. 11/30/20 11/30/21 Yes Sheikh, [place], DO  Glycerin-Hypromellose-PEG 400 0.2-0.2-1 % SOLN PLACE 1 DROP INTO BOTH EYES DAILY AS NEEDED FOR DRY EYES. Patient taking differently: Place 1 drop into both eyes 2 (two) times  daily as needed (dry eyes). 11/30/20 11/30/21 Yes Sheikh, Omair Latif, DO  hydrOXYzine (ATARAX/VISTARIL) 25 MG tablet TAKE 1 TABLET (25 MG TOTAL) BY MOUTH THREE TIMES DAILY AS NEEDED FOR ANXIETY. Patient taking differently: Take 25 mg by mouth 3 (three) times daily. 11/30/20 11/30/21 Yes Sheikh, Omair Latif, DO  loratadine (CLARITIN) 10 MG tablet TAKE 1 TABLET (10 MG TOTAL) BY MOUTH DAILY. Patient taking differently: Take 10 mg by mouth daily. 11/30/20 11/30/21 Yes Sheikh, [place], DO  mometasone-formoterol (DULERA) 100-5 MCG/ACT AERO INHALE 2 PUFFS INTO THE LUNGS TWO TIMES DAILY. 11/30/20 11/30/21 Yes Sheikh, Omair Latif, DO  montelukast (SINGULAIR) 10 MG tablet TAKE 1 TABLET (10 MG TOTAL) BY MOUTH AT BEDTIME. Patient taking differently: Take 10 mg by mouth at bedtime. 11/30/20 11/30/21 Yes Sheikh, Omair Latif, DO  Multiple Vitamin (MULTIVITAMIN WITH MINERALS) TABS tablet Take 1 tablet by mouth daily. 11/30/20  Yes Sheikh, Omair Latif, DO  nystatin (MYCOSTATIN) 100000 UNIT/ML suspension TAKE 5 MLS (500,000 UNITS TOTAL) BY MOUTH FOUR TIMES DAILY. Patient taking differently: Use as directed 5 mLs in the mouth or throat 4 (four) times daily. 11/30/20 11/30/21 Yes Sheikh, Omair Latif, DO  OLANZapine (ZYPREXA) 5 MG tablet TAKE 1 TABLET (5 MG TOTAL) BY MOUTH AT BEDTIME. Patient taking differently: Take 5 mg by mouth at bedtime. 11/30/20 11/30/21 Yes Sheikh, Omair Latif, DO  oxyCODONE (ROXICODONE) 5 MG immediate release tablet Take 1 tablet (5 mg total) by mouth every 6 (six) hours as needed for severe pain. 07/16/21  Yes Muthersbaugh, Jarrett Soho, PA-C  pantoprazole (PROTONIX) 40 MG tablet TAKE 1 TABLET (40 MG TOTAL) BY MOUTH DAILY. Patient taking differently: Take 40 mg by mouth daily. 11/30/20 11/30/21 Yes Sheikh, Omair Latif, DO  polyvinyl alcohol (LIQUIFILM TEARS) 1.4 % ophthalmic solution Place 1 drop into both eyes daily as needed for dry eyes. 11/30/20  Yes Sheikh, Omair Latif, DO  sodium chloride (OCEAN) 0.65 %  nasal spray PLACE 1 SPRAY INTO BOTH NOSTRILS AS NEEDED FOR CONGESTION. Patient taking differently: Place 1 spray into the nose daily as needed for congestion. 11/30/20 11/30/21 Yes Sheikh, Omair Latif, DO  umeclidinium bromide (INCRUSE ELLIPTA) 62.5 MCG/INH AEPB INHALE 1 PUFF INTO THE LUNGS DAILY. Patient taking differently: Inhale 1 puff into the lungs daily. 11/30/20 11/30/21 Yes Sheikh, Omair Latif, DO  amLODipine (NORVASC) 5 MG tablet TAKE 1 TABLET (5 MG TOTAL) BY MOUTH DAILY. Patient not taking: Reported on 11/20/2021 11/30/20 11/30/21  Raiford Noble Latif, DO  amoxicillin-clavulanate (AUGMENTIN) 875-125 MG tablet TAKE 1 TABLET BY MOUTH EVERY TWELVE HOURS FOR 7 DAYS. Patient not taking: Reported on 11/20/2021 11/30/20 11/30/21  Raiford Noble Latif, DO  aspirin 325 MG tablet Take 1 tablet (325 mg total) by mouth daily. Patient not taking: Reported on 11/20/2021 12/01/20   Raiford Noble Latif, DO  Maltodextrin-Xanthan Gum (Cecil) POWD TAKE BY MOUTH AS DIRECTED Patient not taking: Reported on 11/20/2021 11/30/20 11/30/21  Raiford Noble Latif, DO  menthol-cetylpyridinium (CEPACOL) 3 MG  lozenge Take 1 lozenge (3 mg total) by mouth as needed for sore throat. Patient not taking: Reported on 11/20/2021 11/30/20   Raiford Noble Latif, DO  sodium chloride (OCEAN) 0.65 % SOLN nasal spray Place 1 spray into both nostrils as needed for congestion. Patient not taking: Reported on 11/20/2021 11/30/20   Kerney Elbe, DO      Allergies    Nsaids    Review of Systems   Review of Systems  Constitutional:  Positive for fatigue. Negative for chills, diaphoresis and fever.  HENT:  Negative for congestion, rhinorrhea and sneezing.   Eyes: Negative.   Respiratory:  Negative for cough, chest tightness and shortness of breath.   Cardiovascular:  Negative for chest pain and leg swelling.  Gastrointestinal:  Positive for nausea and vomiting. Negative for abdominal pain, blood in stool and diarrhea.   Genitourinary:  Negative for difficulty urinating, flank pain, frequency and hematuria.  Musculoskeletal:  Negative for arthralgias and back pain.  Skin:  Negative for rash.  Neurological:  Positive for headaches. Negative for dizziness, speech difficulty, weakness and numbness.  Psychiatric/Behavioral:  Positive for suicidal ideas.    Physical Exam Updated Vital Signs BP 101/70    Pulse 71    Temp (!) 97.4 F (36.3 C) (Oral)    Resp 20    Ht 5\' 1"  (1.549 m)    SpO2 96%    BMI 45.11 kg/m  Physical Exam Constitutional:      Appearance: She is well-developed.  HENT:     Head: Normocephalic and atraumatic.  Eyes:     Extraocular Movements: Extraocular movements intact.     Pupils: Pupils are equal, round, and reactive to light.  Cardiovascular:     Rate and Rhythm: Normal rate and regular rhythm.     Heart sounds: Normal heart sounds.  Pulmonary:     Effort: Pulmonary effort is normal. No respiratory distress.     Breath sounds: Normal breath sounds. No wheezing or rales.  Chest:     Chest wall: No tenderness.  Abdominal:     General: Bowel sounds are normal.     Palpations: Abdomen is soft.     Tenderness: There is no abdominal tenderness. There is no guarding or rebound.  Musculoskeletal:        General: Normal range of motion.     Cervical back: Normal range of motion and neck supple.  Lymphadenopathy:     Cervical: No cervical adenopathy.  Skin:    General: Skin is warm and dry.     Findings: No rash.  Neurological:     General: No focal deficit present.     Mental Status: She is alert and oriented to person, place, and time.     Comments: Motor 5 out of 5 all extremities other than the right lower extremity which she says is weaker due to chronic pain she has in her right knee.  Sensation grossly intact to light touch all extremities, cranial nerves II to XII grossly intact    ED Results / Procedures / Treatments   Labs (all labs ordered are listed, but only  abnormal results are displayed) Labs Reviewed  COMPREHENSIVE METABOLIC PANEL - Abnormal; Notable for the following components:      Result Value   Glucose, Bld 107 (*)    All other components within normal limits  SALICYLATE LEVEL - Abnormal; Notable for the following components:   Salicylate Lvl [id number] (*)    All other components within normal  limits  ACETAMINOPHEN LEVEL - Abnormal; Notable for the following components:   Acetaminophen (Tylenol), Serum <10 (*)    All other components within normal limits  CBC - Abnormal; Notable for the following components:   HCT 46.7 (*)    All other components within normal limits  CBG MONITORING, ED - Abnormal; Notable for the following components:   Glucose-Capillary 133 (*)    All other components within normal limits  ETHANOL  RAPID URINE DRUG SCREEN, HOSP PERFORMED  I-STAT BETA HCG BLOOD, ED ([facility], [facility], [facility] ONLY)    EKG EKG Interpretation  Date/Time:  Monday November 20 2021 07:58:23 EST Ventricular Rate:  79 PR Interval:  138 QRS Duration: 70 QT Interval:  392 QTC Calculation: 449 R Axis:   3 Text Interpretation: Normal sinus rhythm T wave abnormality, consider inferolateral ischemia Abnormal ECG When compared with ECG of 10-Nov-2020 20:35, PREVIOUS ECG IS PRESENT since last tracing no significant change Confirmed by Malvin Johns [phone]) on 11/20/2021 10:43:23 AM  Radiology CT Head Wo Contrast  Result Date: 11/20/2021 CLINICAL DATA:  Headache, chronic, new features or increased frequency. EXAM: CT HEAD WITHOUT CONTRAST TECHNIQUE: Contiguous axial images were obtained from the base of the skull through the vertex without intravenous contrast. RADIATION DOSE REDUCTION: This exam was performed according to the departmental dose-optimization program which includes automated exposure control, adjustment of the mA and/or kV according to patient size and/or use of iterative reconstruction technique. COMPARISON:  Brain MRI 11/11/2020. Brain MRI  11/05/2020. FINDINGS: Brain: Cerebral volume is normal. 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 Background mild chronic small vessel ischemic changes within the cerebral white matter. Partially empty sella turcica. Cavum septum pellucidum and cavum vergae. There is no acute intracranial hemorrhage. No demarcated cortical infarct. No extra-axial fluid collection. No evidence of an intracranial mass. No midline shift. Vascular: No hyperdense vessel.  Atherosclerotic calcifications. Skull: Normal. Negative for fracture or focal lesion. Sinuses/Orbits: Visualized orbits show no acute finding. No significant paranasal sinus disease at the imaged levels. Other: Trace fluid within the bilateral mastoid air cells. IMPRESSION: No evidence of acute intracranial abnormality. Known small chronic infarcts in the bilateral cerebral hemispheric white matter, right thalamus, right pons and dorsolateral left medulla, some of which were better appreciated on the prior MRI examinations of 11/11/2020 and 11/10/2020. Background mild (but age advanced) cerebral white matter chronic small vessel ischemic disease. Trace fluid within the bilateral mastoid air cells. Electronically Signed   By: Kellie Simmering D.O.   On: 11/20/2021 12:02    Procedures Procedures    Medications Ordered in ED Medications  acetaminophen (TYLENOL) tablet 650 mg (has no administration in time range)  albuterol (VENTOLIN HFA) 108 (90 Base) MCG/ACT inhaler 1-2 puff (has no administration in time range)  atorvastatin (LIPITOR) tablet 40 mg (has no administration in time range)  clopidogrel (PLAVIX) tablet 75 mg (has no administration in time range)  FLUoxetine (PROZAC) capsule 20 mg (has no administration in time range)  gabapentin (NEURONTIN) capsule 300 mg (has  no administration in time range)  Glycerin-Hypromellose-PEG 400 0.2-0.2-1 % SOLN 1 drop (has no administration in time range)  mometasone-formoterol (DULERA) 100-5 MCG/ACT inhaler 2 puff (has no administration in time range)  montelukast (SINGULAIR) tablet 10 mg (has no administration in time range)  OLANZapine (ZYPREXA) tablet 5 mg (has no administration in time range)  pantoprazole (PROTONIX) EC tablet 40 mg (has no administration in time range)  umeclidinium bromide (INCRUSE ELLIPTA) 62.5 MCG/INH 1 puff (has no administration in time range)    ED Course/ Medical Decision Making/ A&P                           Medical Decision Making  Patient is a 51 year old female who was sent from behavioral health urgent care for hypertension and dizziness and needed medical clearance for behavioral health evaluation.  She is reporting suicidal ideations.  Per chart review, she was given clonidine at the behavioral health urgent care.  She has not had any elevation in her blood pressure since arrival to the emergency department.  Her last blood pressure was 101/70.  Her other vital signs are stable.  She is neurologically intact.  She does not have any suggestions of a stroke.  She did have a headache and reported vomiting so a head CT was performed given her history of cocaine use.  This showed no intracranial hemorrhage or other acute abnormality.  The images were reviewed by me.  Her other labs are nonconcerning.  We did ambulate the patient and she is able to ambulate in the room.  She does not report any dizziness.  She has some difficulty with ambulation due to chronic issues with her right knee but she is able to ambulate.  I have spoken to the behavioral health urgent care regarding transfer back to their center but have not heard back regarding this.  Will consult TTS.  She is medically cleared for behavioral health evaluation.  Her home medications were ordered although I did not order her blood pressure  medications given that she has had a fairly low blood pressure in the ED currently.  Final Clinical Impression(s) / ED Diagnoses Final diagnoses:  Suicidal ideation  Hypertension, unspecified type    Rx / DC Orders ED Discharge Orders     None         Malvin Johns, MD 11/20/21 1521

## 2021-11-20 NOTE — ED Notes (Addendum)
Pt changed into purple scrubs  Belongings inventoried

## 2021-11-20 NOTE — ED Notes (Addendum)
Pt given [place] sandwich and drink  Pt is tearful once she is brought back from CT. When asked what happened, pt replies "just got a lot going on in my head that's all"

## 2021-11-20 NOTE — Progress Notes (Signed)
°   11/20/21 0628  [facility] Triage Screening (Walk-ins at [facility] only)  How Did You Hear About [place]? Self  What Is the Reason for Your Visit/Call Today? Cathy Hall is a 51 year old female presenting voluntary brought in by [employer] due to SI with plan to cut self or overdose on pills. Prior to TTS assessment, patient vitals were taken, medical attention needed, elevated blood pressure, therefore patient was transferred to ED. Patient reported "I have a lot going on". Patient reported grief/loss issues of sons death 2 years ago and then patients twin died 4 months after her son died. Patient reported she is trying to stay alive for her son's 20 year old daughter. Patient reported suicide attempt "a few months ago where I cut my arms with a razor". Patient denied receiving any outpatient mental health services.  How Long Has This Been Causing You Problems? 1-6 months  Have You Recently Had Any Thoughts About Hurting Yourself? Yes  How long ago did you have thoughts about hurting yourself? today  Are You Planning to Commit Suicide/Harm Yourself At This time? Yes  Have you Recently Had Thoughts About Hurting Someone Karolee Ohs? No  Are You Planning To Harm Someone At This Time? No  Are you currently experiencing any auditory, visual or other hallucinations? No  Have You Used Any Alcohol or Drugs in the Past 24 Hours?  (uta)  Do you have any current medical co-morbidities that require immediate attention? No  Clinician description of patient physical appearance/behavior: casual / cooperative  What Do You Feel Would Help You the Most Today? Treatment for Depression or other mood problem  If access to [facility] was not available, would you have sought care in the Emergency Department? Yes  Determination of Need Emergent (2 hours)  Options For Referral Inpatient Hospitalization

## 2021-11-20 NOTE — ED Triage Notes (Signed)
Patient with history of hypertension but does consistently take medications BIB [facility] from [facility] for medical clearance. Patient presented to [facility] voluntarily with complaint of SI with plan to overdose on medications, found to be hypertensive. Patient given 0.2mg  clonidine at [facility] and transferred to [facility] for medical clearance. Patient normotensive with BP 127/83 in triage.
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
## 2021-11-20 NOTE — BH Assessment (Addendum)
Pt meets inpatient psychiatric care criteria per Margorie John, PA.  There are no appropriate beds at [facility], per Norman Maudie Mercury, RN). @2232 , patient faxed out to multiple facilities for consideration of inpatient treatment. Patient under review at the follow hospitals:   Destination Service Provider Request Status Selected Services Address Phone Fax Patient Preferred  [facility] [address], [place] [address] [phone] --  [facility] [address] [place] [address] [phone] --  [place], [place] [address] [phone] --  [place], [place] [address] [place] [address] [phone] --  [place] [phone] [address] [place] [address] [place] --  [facility]  Pending - Request Sent N/A [address] [place], [place] [address] [id number] --  [facility]  Pending - Request Sent N/A [address] [place] [address] [phone] --  [facility]  Pending - Request Sent N/A [address] [place] [address] [phone] --  [facility]  Pending - Request Sent N/A [address] [place] [address] [id number] --  [facility]  Pending - Request Sent N/A [address] [place] [address] [phone] --  [place] N/A [address], [place] [address] [phone] --  [place]  Pending -  Request Sent N/A [address] [place] [address] [phone] --  [facility]  Pending - Request Sent N/A [address] [place] [place] [address] [phone] --  [facility]  Pending - Request Sent N/A [address], [place] [address] [phone] --  [place] N/A [address] [place] [address] [phone] --  [facility]  Pending - Request Sent N/A [address], [place] [address] [phone] --  [facility]  Pending - Request Sent N/A [address], [place] [address] [phone] --  [facility]  Pending - Request Sent N/A [address] [place] [address] [phone] --  [place] N/A [address] [place] [address] [phone] --  [facility]  Pending - Request Sent N/A [address], [place] [address] [phone] --

## 2021-11-20 NOTE — ED Notes (Signed)
Patient with BP elevated - orders received and patient will be going [facility]  Meds administered as ordered

## 2021-11-20 NOTE — ED Notes (Signed)
At 1154 pt received a [place] sandwich meal. This writer gave Pt a sandwich meal ,peanut butter and crackers . Pt also had a soda. Pt reported she had not had anything to eat since 0600. Pt anxious about when dinner was going to arrive.

## 2021-11-20 NOTE — ED Provider Notes (Signed)
Behavioral Health Urgent Care Medical Screening Exam  Patient Name: Cathy Hall MRN: [id number] Date of Evaluation: 11/20/21 Chief Complaint:   Diagnosis:  Final diagnoses:  None    History of Present illness: Kobe Jansma is a 51 y.o. female with history of depression, cocaine abuse, hypertension, COPD, and stroke with left-sided weakness.  Patient presented voluntarily to [facility] with complaint of worsening depression.  Patient was seen face-to-face and her chart was reviewed by this nurse practitioner.  Patient is assessed in the locker room area due to patient complaining of dizziness and weakness while nursing staff where assisting her with putting her items away. On approach, patient is sitting in a chair connected to blood pressure machine.  Patient is noted with elevated blood pressure of 222/134 and complaining of dizziness.  Patient blood sugar is 113. Patient reported that she is has a history of hypertension and stroke.  Patient reported that she is unable to ambulate due to dizziness. patient reported that she is prescribed antihypertensives but has not taken medications in over 2 weeks.  Patient is alert and oriented x4, speech is clear and coherent.  Patient is able to move all limbs against gravity/resistance. No complaint of headache, speech change, confusion, or focal weakness.   Psychiatric Specialty Exam  Presentation  General Appearance:Casual  Eye Contact:Good  Speech:Clear and Coherent  Speech Volume:Normal  Handedness:Right   Mood and Affect  Mood:Depressed  Affect:Congruent   Thought Process  Thought Processes:Coherent  Descriptions of Associations:Intact  Orientation:Full (Time, Place and Person)  Thought Content:Logical    Hallucinations:None  Ideas of Reference:None  Suicidal Thoughts:No  Homicidal Thoughts:No   Sensorium  Memory:Immediate Good; Recent Good; Remote  Good  Judgment:Fair  Insight:Good   Executive Functions  Concentration:Fair  Attention Span:Fair  Recall:Fair  Fund of Knowledge:Good  Language:Good   Psychomotor Activity  Psychomotor Activity:Normal   Assets  Assets:Desire for Improvement; Manufacturing systems engineer; Housing   Sleep  Sleep:Fair  Number of hours: No data recorded  No data recorded  Physical Exam: Physical Exam Vitals and nursing note reviewed.  Constitutional:      General: She is not in acute distress.    Appearance: She is well-developed.  HENT:     Head: Normocephalic and atraumatic.  Eyes:     Conjunctiva/sclera: Conjunctivae normal.  Cardiovascular:     Rate and Rhythm: Normal rate.  Pulmonary:     Effort: Pulmonary effort is normal. No respiratory distress.     Breath sounds: Normal breath sounds.  Abdominal:     Palpations: Abdomen is soft.     Tenderness: There is no abdominal tenderness.  Musculoskeletal:     Cervical back: Neck supple.  Skin:    General: Skin is warm and dry.     Capillary Refill: Capillary refill takes less than 2 seconds.  Neurological:     Mental Status: She is alert and oriented to person, place, and time.     Sensory: No sensory deficit.     Motor: Weakness present.  Psychiatric:        Attention and Perception: Attention and perception normal.        Mood and Affect: Mood is anxious and depressed.        Speech: Speech normal.        Behavior: Behavior normal. Behavior is cooperative.        Thought Content: Thought content normal.        Cognition and Memory: Cognition normal.   Review of Systems  Constitutional: Negative.   HENT: Negative.    Eyes: Negative.   Respiratory: Negative.    Cardiovascular: Negative.   Gastrointestinal: Negative.   Genitourinary: Negative.   Musculoskeletal: Negative.   Skin: Negative.   Neurological: Negative.   Endo/Heme/Allergies: Negative.   Psychiatric/Behavioral:  The patient is nervous/anxious.   Blood  pressure (!) 222/134, pulse 89, temperature 98.2 F (36.8 C), temperature source Oral, resp. rate 18, SpO2 100 %. There is no height or weight on file to calculate BMI.  Musculoskeletal: Strength & Muscle Tone: within normal limits Gait & Station: normal, unsteady Patient leans: Right   [facility] MSE Discharge Disposition for Follow up and Recommendations: Based on my evaluation the patient appears to have an emergency medical condition for which I recommend the patient be transferred to the emergency department for further evaluation.    Maricela Bo, NP 11/20/2021, 6:30 AM

## 2021-11-20 NOTE — ED Notes (Signed)
Pt currently speaking with TTS. 

## 2021-11-20 NOTE — ED Notes (Signed)
Patient left with EMS, A/O no signs of distress  BP slightly lower

## 2021-11-20 NOTE — ED Notes (Signed)
Provider made aware and is on the way to see patient, Her BP is elevated and she says she feels "dizzy"

## 2021-11-21 ENCOUNTER — Other Ambulatory Visit: Payer: Self-pay | Admitting: Registered Nurse

## 2021-11-21 ENCOUNTER — Encounter (HOSPITAL_COMMUNITY): Payer: Self-pay | Admitting: Emergency Medicine

## 2021-11-21 ENCOUNTER — Inpatient Hospital Stay (HOSPITAL_COMMUNITY)
Admission: AD | Admit: 2021-11-21 | Discharge: 2021-11-29 | DRG: 885 | Disposition: A | Discharge status: Home / Self Care | Payer: Federal, State, Local not specified - Other | Source: Intra-hospital | Attending: Emergency Medicine | Admitting: Emergency Medicine

## 2021-11-21 ENCOUNTER — Other Ambulatory Visit: Payer: Self-pay

## 2021-11-21 ENCOUNTER — Encounter (HOSPITAL_COMMUNITY): Payer: Self-pay | Admitting: Registered Nurse

## 2021-11-21 DIAGNOSIS — F4310 Post-traumatic stress disorder, unspecified: Secondary | ICD-10-CM | POA: Diagnosis present

## 2021-11-21 DIAGNOSIS — Z9151 Personal history of suicidal behavior: Secondary | ICD-10-CM | POA: Diagnosis not present

## 2021-11-21 DIAGNOSIS — Z833 Family history of diabetes mellitus: Secondary | ICD-10-CM | POA: Diagnosis not present

## 2021-11-21 DIAGNOSIS — R45851 Suicidal ideations: Secondary | ICD-10-CM | POA: Diagnosis present

## 2021-11-21 DIAGNOSIS — Z7982 Long term (current) use of aspirin: Secondary | ICD-10-CM

## 2021-11-21 DIAGNOSIS — G479 Sleep disorder, unspecified: Secondary | ICD-10-CM | POA: Diagnosis present

## 2021-11-21 DIAGNOSIS — G4700 Insomnia, unspecified: Secondary | ICD-10-CM | POA: Diagnosis present

## 2021-11-21 DIAGNOSIS — Z7902 Long term (current) use of antithrombotics/antiplatelets: Secondary | ICD-10-CM

## 2021-11-21 DIAGNOSIS — Z818 Family history of other mental and behavioral disorders: Secondary | ICD-10-CM

## 2021-11-21 DIAGNOSIS — Z8249 Family history of ischemic heart disease and other diseases of the circulatory system: Secondary | ICD-10-CM

## 2021-11-21 DIAGNOSIS — Z6841 Body Mass Index (BMI) 40.0 and over, adult: Secondary | ICD-10-CM

## 2021-11-21 DIAGNOSIS — Z7951 Long term (current) use of inhaled steroids: Secondary | ICD-10-CM

## 2021-11-21 DIAGNOSIS — B379 Candidiasis, unspecified: Secondary | ICD-10-CM | POA: Diagnosis present

## 2021-11-21 DIAGNOSIS — Z79899 Other long term (current) drug therapy: Secondary | ICD-10-CM

## 2021-11-21 DIAGNOSIS — I10 Essential (primary) hypertension: Secondary | ICD-10-CM | POA: Diagnosis present

## 2021-11-21 DIAGNOSIS — Z634 Disappearance and death of family member: Secondary | ICD-10-CM

## 2021-11-21 DIAGNOSIS — F1410 Cocaine abuse, uncomplicated: Secondary | ICD-10-CM | POA: Diagnosis present

## 2021-11-21 DIAGNOSIS — F1420 Cocaine dependence, uncomplicated: Secondary | ICD-10-CM | POA: Diagnosis present

## 2021-11-21 DIAGNOSIS — E1140 Type 2 diabetes mellitus with diabetic neuropathy, unspecified: Secondary | ICD-10-CM | POA: Diagnosis present

## 2021-11-21 DIAGNOSIS — I69354 Hemiplegia and hemiparesis following cerebral infarction affecting left non-dominant side: Secondary | ICD-10-CM | POA: Diagnosis not present

## 2021-11-21 DIAGNOSIS — H1010 Acute atopic conjunctivitis, unspecified eye: Secondary | ICD-10-CM | POA: Diagnosis present

## 2021-11-21 DIAGNOSIS — F333 Major depressive disorder, recurrent, severe with psychotic symptoms: Secondary | ICD-10-CM | POA: Diagnosis not present

## 2021-11-21 DIAGNOSIS — F17210 Nicotine dependence, cigarettes, uncomplicated: Secondary | ICD-10-CM | POA: Diagnosis present

## 2021-11-21 DIAGNOSIS — R079 Chest pain, unspecified: Secondary | ICD-10-CM | POA: Diagnosis present

## 2021-11-21 DIAGNOSIS — K146 Glossodynia: Secondary | ICD-10-CM | POA: Diagnosis present

## 2021-11-21 DIAGNOSIS — Z20822 Contact with and (suspected) exposure to covid-19: Secondary | ICD-10-CM | POA: Diagnosis present

## 2021-11-21 DIAGNOSIS — J449 Chronic obstructive pulmonary disease, unspecified: Secondary | ICD-10-CM | POA: Diagnosis present

## 2021-11-21 DIAGNOSIS — M62838 Other muscle spasm: Secondary | ICD-10-CM | POA: Diagnosis present

## 2021-11-21 DIAGNOSIS — F515 Nightmare disorder: Secondary | ICD-10-CM | POA: Diagnosis present

## 2021-11-21 DIAGNOSIS — H04123 Dry eye syndrome of bilateral lacrimal glands: Secondary | ICD-10-CM | POA: Diagnosis present

## 2021-11-21 DIAGNOSIS — R41843 Psychomotor deficit: Secondary | ICD-10-CM | POA: Diagnosis present

## 2021-11-21 DIAGNOSIS — G4733 Obstructive sleep apnea (adult) (pediatric): Secondary | ICD-10-CM | POA: Diagnosis present

## 2021-11-21 DIAGNOSIS — R262 Difficulty in walking, not elsewhere classified: Secondary | ICD-10-CM | POA: Diagnosis present

## 2021-11-21 DIAGNOSIS — Z7984 Long term (current) use of oral hypoglycemic drugs: Secondary | ICD-10-CM

## 2021-11-21 DIAGNOSIS — M25561 Pain in right knee: Secondary | ICD-10-CM | POA: Diagnosis present

## 2021-11-21 LAB — CBG MONITORING, ED: Glucose-Capillary: 130 mg/dL — ABNORMAL HIGH (ref 70–99)

## 2021-11-21 MED ORDER — CLOPIDOGREL BISULFATE 75 MG PO TABS
75.0000 mg | ORAL_TABLET | Freq: Every day | ORAL | Status: DC
  Administered 2021-11-22 – 2021-11-29 (×8): 75 mg via ORAL
  Filled 2021-11-21 (×4): qty 1
  Filled 2021-11-21: qty 7
  Filled 2021-11-21 (×7): qty 1

## 2021-11-21 MED ORDER — PANTOPRAZOLE SODIUM 40 MG PO TBEC
40.0000 mg | DELAYED_RELEASE_TABLET | Freq: Every day | ORAL | Status: DC
  Administered 2021-11-22 – 2021-11-29 (×8): 40 mg via ORAL
  Filled 2021-11-21 (×7): qty 1
  Filled 2021-11-21: qty 7
  Filled 2021-11-21 (×3): qty 1

## 2021-11-21 MED ORDER — AMLODIPINE BESYLATE 10 MG PO TABS
10.0000 mg | ORAL_TABLET | Freq: Every day | ORAL | Status: DC
  Administered 2021-11-21 – 2021-11-29 (×9): 10 mg via ORAL
  Filled 2021-11-21 (×10): qty 1
  Filled 2021-11-21: qty 7

## 2021-11-21 MED ORDER — ASPIRIN EC 325 MG PO TBEC
325.0000 mg | DELAYED_RELEASE_TABLET | Freq: Every day | ORAL | Status: DC
  Administered 2021-11-21 – 2021-11-29 (×9): 325 mg via ORAL
  Filled 2021-11-21 (×2): qty 1
  Filled 2021-11-21: qty 7
  Filled 2021-11-21 (×9): qty 1

## 2021-11-21 MED ORDER — FLUOXETINE HCL 20 MG PO CAPS
20.0000 mg | ORAL_CAPSULE | Freq: Every day | ORAL | Status: DC
  Administered 2021-11-22: 20 mg via ORAL
  Filled 2021-11-21 (×3): qty 1

## 2021-11-21 MED ORDER — ALBUTEROL SULFATE HFA 108 (90 BASE) MCG/ACT IN AERS
1.0000 | INHALATION_SPRAY | RESPIRATORY_TRACT | Status: DC | PRN
  Administered 2021-11-21: 1 via RESPIRATORY_TRACT
  Administered 2021-11-26 – 2021-11-27 (×3): 2 via RESPIRATORY_TRACT
  Filled 2021-11-21: qty 6.7

## 2021-11-21 MED ORDER — INFLUENZA VAC SPLIT QUAD 0.5 ML IM SUSY
0.5000 mL | PREFILLED_SYRINGE | INTRAMUSCULAR | Status: DC
  Filled 2021-11-21: qty 0.5

## 2021-11-21 MED ORDER — ALUM & MAG HYDROXIDE-SIMETH 200-200-20 MG/5ML PO SUSP: 30.0000 mL | ORAL | Status: DC | PRN

## 2021-11-21 MED ORDER — NICOTINE 21 MG/24HR TD PT24
21.0000 mg | MEDICATED_PATCH | Freq: Every day | TRANSDERMAL | Status: DC
  Administered 2021-11-23: 21 mg via TRANSDERMAL
  Filled 2021-11-21 (×4): qty 1

## 2021-11-21 MED ORDER — ATORVASTATIN CALCIUM 40 MG PO TABS
40.0000 mg | ORAL_TABLET | Freq: Every day | ORAL | Status: DC
  Administered 2021-11-22 – 2021-11-29 (×8): 40 mg via ORAL
  Filled 2021-11-21 (×7): qty 1
  Filled 2021-11-21: qty 7
  Filled 2021-11-21 (×3): qty 1

## 2021-11-21 MED ORDER — MAGNESIUM HYDROXIDE 400 MG/5ML PO SUSP
30.0000 mL | Freq: Every day | ORAL | Status: DC | PRN
  Filled 2021-11-21: qty 30

## 2021-11-21 MED ORDER — HYDROXYZINE HCL 25 MG PO TABS
25.0000 mg | ORAL_TABLET | Freq: Three times a day (TID) | ORAL | Status: DC
  Administered 2021-11-21 – 2021-11-22 (×4): 25 mg via ORAL
  Filled 2021-11-21 (×6): qty 1

## 2021-11-21 MED ORDER — ACETAMINOPHEN 325 MG PO TABS
650.0000 mg | ORAL_TABLET | Freq: Four times a day (QID) | ORAL | Status: DC | PRN
  Administered 2021-11-23 – 2021-11-29 (×8): 650 mg via ORAL
  Filled 2021-11-21 (×8): qty 2

## 2021-11-21 MED ORDER — GABAPENTIN 300 MG PO CAPS
300.0000 mg | ORAL_CAPSULE | Freq: Three times a day (TID) | ORAL | Status: DC
  Administered 2021-11-21 – 2021-11-24 (×9): 300 mg via ORAL
  Filled 2021-11-21 (×15): qty 1

## 2021-11-21 MED ORDER — MOMETASONE FURO-FORMOTEROL FUM 100-5 MCG/ACT IN AERO
2.0000 | INHALATION_SPRAY | Freq: Two times a day (BID) | RESPIRATORY_TRACT | Status: DC
  Administered 2021-11-21 – 2021-11-29 (×16): 2 via RESPIRATORY_TRACT
  Filled 2021-11-21 (×2): qty 8.8

## 2021-11-21 MED ORDER — ADULT MULTIVITAMIN W/MINERALS CH
1.0000 | ORAL_TABLET | Freq: Every day | ORAL | Status: DC
  Administered 2021-11-21 – 2021-11-29 (×9): 1 via ORAL
  Filled 2021-11-21 (×10): qty 1

## 2021-11-21 MED ORDER — OLANZAPINE 5 MG PO TABS
5.0000 mg | ORAL_TABLET | Freq: Every day | ORAL | Status: DC
  Administered 2021-11-21: 5 mg via ORAL
  Filled 2021-11-21 (×2): qty 1

## 2021-11-21 NOTE — ED Notes (Signed)
Lunch Ordered °

## 2021-11-21 NOTE — ED Notes (Signed)
Placed Breakfast Order 

## 2021-11-21 NOTE — Progress Notes (Signed)
[facility] LCSW Progress Note   11/21/2021    9:58 AM  Cathy Hall [name]   [id number]   Type of Contact and Topic:  Psychiatric Bed Placement   Pt accepted to [facility] 406-2    Patient meets inpatient criteria per Melbourne Abts, NP   The attending provider will be Bartholomew Crews, MD   Call report to [phone]    Dani Gobble, RN @ [facility] ED notified.     Pt scheduled  to arrive at [facility] TODAY at 1130.    Damita Dunnings, MSW, LCSW-A  9:59 AM 11/21/2021

## 2021-11-21 NOTE — ED Notes (Signed)
Pt is talking to psych at this time via TTS.

## 2021-11-21 NOTE — ED Notes (Addendum)
Called and left message with [facility] for transportation to patient to [facility]
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
## 2021-11-21 NOTE — Progress Notes (Signed)
Called [facility] and spoke with staff Latricia RN. Nurse was able to confirmed some of patient belongings left behind in [place] [facility]28 form prior shifts. Belongings were not transferred when patient was moved to [facility].  Nurse stated she will have belongings transported to facility.
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
## 2021-11-21 NOTE — ED Notes (Addendum)
Pt is accepted to [facility], room 406-02, Attending Dr. Mason Jim, DX [facility], you may call report to [phone]. Room ready at 1130AM. Attempted report multiple times with no answer. Will try again. Voluntary papers signed by pt and witnessed by this RN faxed to [facility].

## 2021-11-21 NOTE — Group Note (Signed)
Recreation Therapy Group Note   Group Topic:Animal Assisted Therapy   Group Date: 11/21/2021 Start Time: 1430 End Time: 1515 Facilitators: Caroll Rancher, LRT,CTRS Location: [address] Dayroom   Animal-Assisted Activity (AAA) Program Checklist/Progress Note Patient Eligibility Criteria Checklist & Daily Group note for Rec Tx Intervention   AAA/T Program Assumption of Risk Form signed by Patient/ or Parent Legal Guardian YES  Patient understands their participation is voluntary YES  Group Description: Patients provided opportunity to interact with trained and credentialed [employer] dog and the community volunteer/dog handler. Patients practiced appropriate animal interaction and were educated on dog safety outside of the hospital in common community settings. Patients were allowed to use dog toys and other items to practice commands, engage the dog in play, and/or complete routine aspects of animal care.   Education: Charity fundraiser, Health visitor, Communication & Social Skills    Affect/Mood: N/A   Participation Level: Did not attend    Clinical Observations/Individualized Feedback:     Plan: Continue to engage patient in RT group sessions 2-3x/week.   Caroll Rancher, Antonietta Jewel 11/21/2021 3:56 PM

## 2021-11-22 ENCOUNTER — Inpatient Hospital Stay (HOSPITAL_COMMUNITY): Payer: Federal, State, Local not specified - Other

## 2021-11-22 ENCOUNTER — Encounter (HOSPITAL_COMMUNITY): Payer: Self-pay | Admitting: Registered Nurse

## 2021-11-22 ENCOUNTER — Encounter (HOSPITAL_COMMUNITY): Payer: Self-pay

## 2021-11-22 ENCOUNTER — Other Ambulatory Visit: Payer: Self-pay

## 2021-11-22 DIAGNOSIS — F1410 Cocaine abuse, uncomplicated: Secondary | ICD-10-CM | POA: Diagnosis present

## 2021-11-22 DIAGNOSIS — F333 Major depressive disorder, recurrent, severe with psychotic symptoms: Principal | ICD-10-CM

## 2021-11-22 LAB — CBC WITH DIFFERENTIAL/PLATELET
Abs Immature Granulocytes: 0.03 10*3/uL (ref 0.00–0.07)
Basophils Absolute: 0 10*3/uL (ref 0.0–0.1)
Basophils Relative: 1 %
Eosinophils Absolute: 0.2 10*3/uL (ref 0.0–0.5)
Eosinophils Relative: 2 %
HCT: 43.7 % (ref 36.0–46.0)
Hemoglobin: 13.8 g/dL (ref 12.0–15.0)
Immature Granulocytes: 0 %
Lymphocytes Relative: 28 %
Lymphs Abs: 2.3 10*3/uL (ref 0.7–4.0)
MCH: 29.7 pg (ref 26.0–34.0)
MCHC: 31.6 g/dL (ref 30.0–36.0)
MCV: 94 fL (ref 80.0–100.0)
Monocytes Absolute: 0.5 10*3/uL (ref 0.1–1.0)
Monocytes Relative: 7 %
Neutro Abs: 5 10*3/uL (ref 1.7–7.7)
Neutrophils Relative %: 62 %
Platelets: 218 10*3/uL (ref 150–400)
RBC: 4.65 MIL/uL (ref 3.87–5.11)
RDW: 14.2 % (ref 11.5–15.5)
WBC: 8.1 10*3/uL (ref 4.0–10.5)
nRBC: 0 % (ref 0.0–0.2)

## 2021-11-22 LAB — RAPID URINE DRUG SCREEN, HOSP PERFORMED
Amphetamines: NOT DETECTED
Barbiturates: NOT DETECTED
Benzodiazepines: NOT DETECTED
Cocaine: POSITIVE — AB
Opiates: NOT DETECTED
Tetrahydrocannabinol: NOT DETECTED

## 2021-11-22 LAB — LIPID PANEL
Cholesterol: 161 mg/dL (ref 0–200)
Cholesterol: 169 mg/dL (ref 0–200)
HDL: 40 mg/dL — ABNORMAL LOW (ref >40)
HDL: 42 mg/dL (ref >40)
LDL Cholesterol: 65 mg/dL (ref 0–99)
LDL Cholesterol: 74 mg/dL (ref 0–99)
Total CHOL/HDL Ratio: 4 RATIO
Total CHOL/HDL Ratio: 4 RATIO
Triglycerides: 280 mg/dL — ABNORMAL HIGH (ref <150)
Triglycerides: 264 mg/dL — ABNORMAL HIGH (ref <150)
VLDL: 56 mg/dL — ABNORMAL HIGH (ref 0–40)
VLDL: 53 mg/dL — ABNORMAL HIGH (ref 0–40)

## 2021-11-22 LAB — TSH
TSH: 0.668 u[IU]/mL (ref 0.350–4.500)
TSH: 0.567 u[IU]/mL (ref 0.350–4.500)

## 2021-11-22 LAB — COMPREHENSIVE METABOLIC PANEL
ALT: 11 U/L (ref 0–44)
AST: 17 U/L (ref 15–41)
Albumin: 3.3 g/dL — ABNORMAL LOW (ref 3.5–5.0)
Alkaline Phosphatase: 70 U/L (ref 38–126)
Anion gap: 13 (ref 5–15)
BUN: 13 mg/dL (ref 6–20)
CO2: 23 mmol/L (ref 22–32)
Calcium: 9.4 mg/dL (ref 8.9–10.3)
Chloride: 107 mmol/L (ref 98–111)
Creatinine, Ser: 0.89 mg/dL (ref 0.44–1.00)
GFR, Estimated: >60 mL/min (ref >60)
Glucose, Bld: 141 mg/dL — ABNORMAL HIGH (ref 70–99)
Potassium: 4.5 mmol/L (ref 3.5–5.1)
Sodium: 143 mmol/L (ref 135–145)
Total Bilirubin: 0.4 mg/dL (ref 0.3–1.2)
Total Protein: 6.6 g/dL (ref 6.5–8.1)

## 2021-11-22 LAB — TROPONIN I (HIGH SENSITIVITY)
Troponin I (High Sensitivity): 15 ng/L (ref <18)
Troponin I (High Sensitivity): 13 ng/L (ref <18)

## 2021-11-22 LAB — URINALYSIS, COMPLETE (UACMP) WITH MICROSCOPIC
Bilirubin Urine: NEGATIVE
Glucose, UA: NEGATIVE mg/dL
Ketones, ur: NEGATIVE mg/dL
Nitrite: NEGATIVE
Protein, ur: NEGATIVE mg/dL
Specific Gravity, Urine: 1.02 (ref 1.005–1.030)
pH: 6 (ref 5.0–8.0)

## 2021-11-22 LAB — I-STAT BETA HCG BLOOD, ED (MC, WL, AP ONLY): I-stat hCG, quantitative: <5 m[IU]/mL (ref <5)

## 2021-11-22 LAB — HEMOGLOBIN A1C
Hgb A1c MFr Bld: 6.5 % — ABNORMAL HIGH (ref 4.8–5.6)
Hgb A1c MFr Bld: 6.5 % — ABNORMAL HIGH (ref 4.8–5.6)
Mean Plasma Glucose: 139.85 mg/dL
Mean Plasma Glucose: 139.85 mg/dL

## 2021-11-22 IMAGING — DX DG CHEST 1V PORT
1 series · 1 of 1 positions shown · non-contrast
Comparison: Chest x-ray [DATE]

CLINICAL DATA: Chest pain

EXAM:
PORTABLE CHEST 1 VIEW

[chest ap]
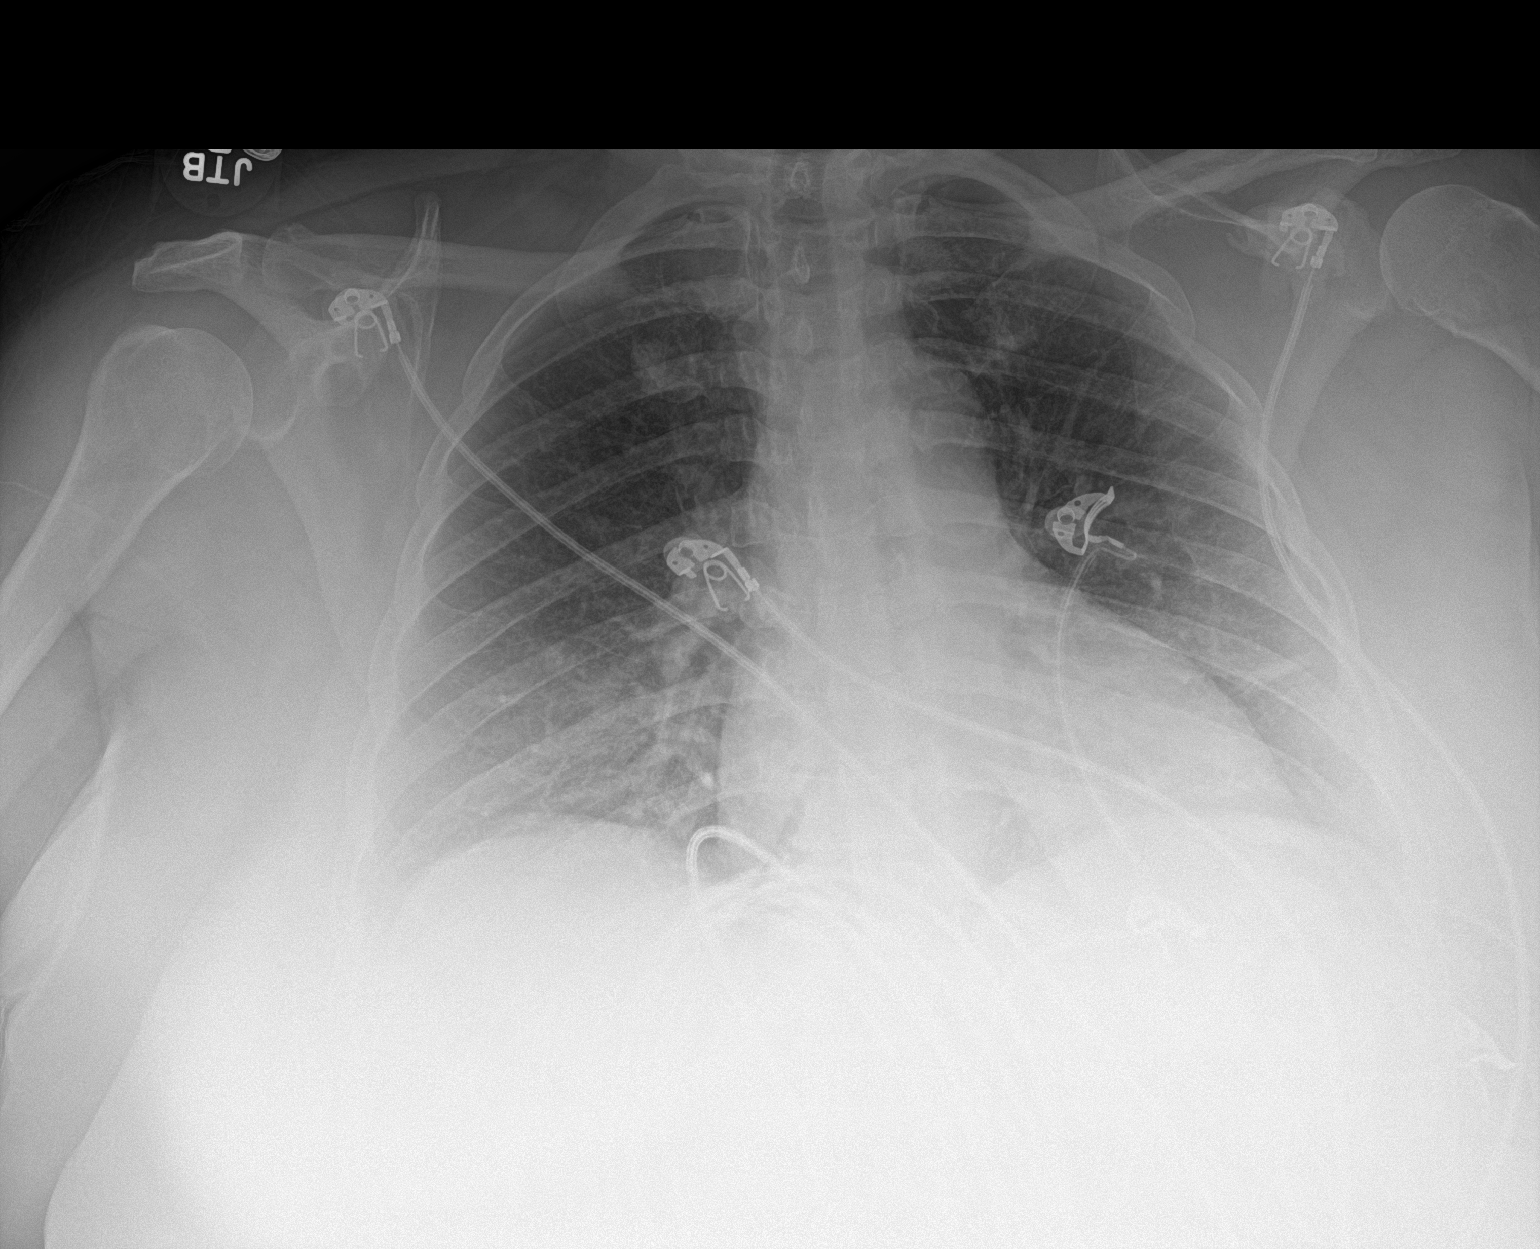

[1 of 1 positions shown; findings below may reference images not displayed]

FINDINGS: Study is somewhat limited due to body habitus and portable
technique.

Heart size is normal. Mediastinum stable and within normal limits.
Small bandlike opacity in the left lower lung zone. No pleural
effusion or pneumothorax.
IMPRESSION: Small linear opacity in the left lower lung zone which likely
represents platelike atelectasis.

## 2021-11-22 MED ORDER — OLANZAPINE 5 MG PO TABS
5.0000 mg | ORAL_TABLET | Freq: Every day | ORAL | Status: DC
  Administered 2021-11-22 – 2021-11-25 (×4): 5 mg via ORAL
  Filled 2021-11-22 (×6): qty 1

## 2021-11-22 MED ORDER — CLONIDINE HCL 0.1 MG PO TABS
0.1000 mg | ORAL_TABLET | Freq: Three times a day (TID) | ORAL | Status: DC | PRN
  Administered 2021-11-24: 0.1 mg via ORAL
  Filled 2021-11-22: qty 1

## 2021-11-22 MED ORDER — FLUOXETINE HCL 10 MG PO CAPS
10.0000 mg | ORAL_CAPSULE | Freq: Every day | ORAL | Status: DC
  Administered 2021-11-23 – 2021-11-24 (×2): 10 mg via ORAL
  Filled 2021-11-22 (×5): qty 1

## 2021-11-22 MED ORDER — HYDROXYZINE HCL 25 MG PO TABS
25.0000 mg | ORAL_TABLET | Freq: Three times a day (TID) | ORAL | Status: DC | PRN
  Administered 2021-11-23 – 2021-11-26 (×8): 25 mg via ORAL
  Filled 2021-11-22 (×9): qty 1

## 2021-11-22 MED ORDER — UMECLIDINIUM BROMIDE 62.5 MCG/ACT IN AEPB
1.0000 | INHALATION_SPRAY | Freq: Every day | RESPIRATORY_TRACT | Status: DC
  Administered 2021-11-22 – 2021-11-29 (×8): 1 via RESPIRATORY_TRACT
  Filled 2021-11-22 (×3): qty 7

## 2021-11-22 MED ORDER — MONTELUKAST SODIUM 10 MG PO TABS
10.0000 mg | ORAL_TABLET | Freq: Every day | ORAL | Status: DC
  Administered 2021-11-22 – 2021-11-28 (×7): 10 mg via ORAL
  Filled 2021-11-22: qty 7
  Filled 2021-11-22 (×10): qty 1
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
## 2021-11-22 NOTE — Plan of Care (Signed)
Nurse discussed anxiety, depression and coping skills. 

## 2021-11-22 NOTE — Progress Notes (Signed)
D:  Patient's self inventory sheet, patient has poor sleep, sleep medication helpful.  Fair appetite, low energy level, poor concentration.  Rated depression, anxiety and coping skills #9.  Denied withdrawals.  SI, contracts for safety.  Physical problems, weakness in legs,using wheelchair.  Goal is to be in a better place today.  Plans to feel better.  No discharge plans. A:  Medications administered per MD orders.  Emotional support and encouragement given patient. R:  Denied SI and HI, contracts for safety.  Denied A/V hallucinations.  Safety maintained with 15 minute checks.

## 2021-11-22 NOTE — ED Provider Notes (Signed)
[facility] EMERGENCY DEPARTMENT Provider Note   CSN: [id number] Arrival date & time: 11/22/21  1053     History  Chief Complaint  Patient presents with   mdd   Chest Pain    Cathy Hall is a 51 y.o. female.  HPI  This is a 51 year old female with history of MDD, hypertension, COPD, asthma, PTSD presenting due to chest pain.  She is at behavioral health currently secondary to suicidal ideations.  She presents today because of chest pain.  Chest pain started last night, initially was intermittent but became constant this morning.  Feels like a heaviness across her chest with some radiation to the back.  Associated with nausea but no vomiting or shortness of breath.  States she saw this pain 2 or 3 times, both times involving death of family members.  No history of MIs, history of ischemic stroke on Plavix.  She was given aspirin which made the pain improved.  No history of MI or PE.  History of cocaine abuse, denies use in the last two days.   Past Medical History:  Diagnosis Date   Anxiety    Asthma    COPD (chronic obstructive pulmonary disease) ([place])    Depression    Hypertension    Major depression    PTSD (post-traumatic stress disorder)      Home Medications Prior to Admission medications   Medication Sig Start Date End Date Taking? Authorizing Provider  acetaminophen (TYLENOL) 325 MG tablet TAKE 2 TABLETS (650 MG TOTAL) BY MOUTH EVERY FOUR HOURS AS NEEDED FOR MILD PAIN (OR TEMP > 37.5 C (99.5 F)). Patient taking differently: Take 650 mg by mouth every 4 (four) hours as needed for mild pain or fever. 11/30/20 11/30/21  Raiford Noble Latif, DO  albuterol (VENTOLIN HFA) 108 (90 Base) MCG/ACT inhaler INHALE 2 PUFFS INTO THE LUNGS EVERY FOUR HOURS AS NEEDED FOR WHEEZING OR SHORTNESS OF BREATH. Patient taking differently: Inhale 1-2 puffs into the lungs every 4 (four) hours as needed for wheezing or shortness of breath. 11/30/20 11/30/21   Raiford Noble Latif, DO  amLODipine (NORVASC) 10 MG tablet Take 10 mg by mouth daily. 01/29/20   [provider]  amLODipine (NORVASC) 5 MG tablet TAKE 1 TABLET (5 MG TOTAL) BY MOUTH DAILY. Patient not taking: Reported on 11/20/2021 11/30/20 11/30/21  Raiford Noble Latif, DO  aspirin 325 MG EC tablet TAKE 1 TABLET (325 MG TOTAL) BY MOUTH DAILY. Patient taking differently: Take 325 mg by mouth daily. 11/30/20 11/30/21  Raiford Noble Latif, DO  aspirin 325 MG tablet Take 1 tablet (325 mg total) by mouth daily. Patient not taking: Reported on 11/20/2021 12/01/20   Raiford Noble Latif, DO  atorvastatin (LIPITOR) 40 MG tablet TAKE 1 TABLET (40 MG TOTAL) BY MOUTH DAILY. Patient taking differently: Take 40 mg by mouth daily. 11/30/20 11/30/21  Raiford Noble Latif, DO  benzocaine (ORAJEL) 10 % mucosal gel USE AS DIRECTED IN THE MOUTH OR THROAT FOUR TIMES DAILY AS NEEDED FOR MOUTH PAIN. Patient taking differently: Use as directed 1 application in the mouth or throat 4 (four) times daily as needed for mouth pain. 11/30/20 11/30/21  Sheikh, Omair Latif, DO  Benzocaine-Menthol 15-3.6 MG LOZG TAKE 1 LOZENGE (3 MG TOTAL) BY MOUTH AS NEEDED FOR SORE THROAT. Patient taking differently: Take 1 lozenge by mouth 4 (four) times daily as needed (sore throat). 11/30/20 11/30/21  Raiford Noble Latif, DO  clopidogrel (PLAVIX) 75 MG tablet TAKE 1 TABLET (75 MG  TOTAL) BY MOUTH DAILY. Patient taking differently: Take 75 mg by mouth daily. 11/30/20 11/30/21  Raiford Noble Latif, DO  diclofenac Sodium (VOLTAREN) 1 % GEL APPLY 1 APPLICATION TOPICALLY FOUR TIMES DAILY. Patient taking differently: Apply 4 g topically 4 (four) times daily. 11/30/20 11/30/21  Raiford Noble Latif, DO  FLUoxetine (PROZAC) 10 MG capsule TAKE 1 CAPSULE (10 MG TOTAL) BY MOUTH DAILY. Patient taking differently: Take 10 mg by mouth daily. 11/30/20 11/30/21  Raiford Noble Latif, DO  fluticasone (FLONASE) 50 MCG/ACT nasal spray PLACE 2 SPRAYS INTO BOTH NOSTRILS  DAILY. 11/30/20 11/30/21  Raiford Noble Latif, DO  gabapentin (NEURONTIN) 300 MG capsule TAKE 1 CAPSULE (300 MG TOTAL) BY MOUTH THREE TIMES DAILY. Patient taking differently: Take 300 mg by mouth 3 (three) times daily. 11/30/20 11/30/21  Sheikh, Omair Latif, DO  Glycerin-Hypromellose-PEG 400 0.2-0.2-1 % SOLN PLACE 1 DROP INTO BOTH EYES DAILY AS NEEDED FOR DRY EYES. Patient taking differently: Place 1 drop into both eyes 2 (two) times daily as needed (dry eyes). 11/30/20 11/30/21  Raiford Noble Latif, DO  hydrOXYzine (ATARAX/VISTARIL) 25 MG tablet TAKE 1 TABLET (25 MG TOTAL) BY MOUTH THREE TIMES DAILY AS NEEDED FOR ANXIETY. Patient taking differently: Take 25 mg by mouth 3 (three) times daily. 11/30/20 11/30/21  Raiford Noble Latif, DO  loratadine (CLARITIN) 10 MG tablet TAKE 1 TABLET (10 MG TOTAL) BY MOUTH DAILY. Patient taking differently: Take 10 mg by mouth daily. 11/30/20 11/30/21  Raiford Noble Latif, DO  Maltodextrin-Xanthan Gum (RESOURCE THICKENUP CLEAR) POWD TAKE BY MOUTH AS DIRECTED Patient not taking: Reported on 11/20/2021 11/30/20 11/30/21  Raiford Noble Latif, DO  menthol-cetylpyridinium (CEPACOL) 3 MG lozenge Take 1 lozenge (3 mg total) by mouth as needed for sore throat. Patient not taking: Reported on 11/20/2021 11/30/20   Raiford Noble Latif, DO  mometasone-formoterol (DULERA) 100-5 MCG/ACT AERO INHALE 2 PUFFS INTO THE LUNGS TWO TIMES DAILY. 11/30/20 11/30/21  Raiford Noble Latif, DO  montelukast (SINGULAIR) 10 MG tablet TAKE 1 TABLET (10 MG TOTAL) BY MOUTH AT BEDTIME. Patient taking differently: Take 10 mg by mouth at bedtime. 11/30/20 11/30/21  Raiford Noble Latif, DO  Multiple Vitamin (MULTIVITAMIN WITH MINERALS) TABS tablet Take 1 tablet by mouth daily. 11/30/20   Raiford Noble Latif, DO  nystatin (MYCOSTATIN) 100000 UNIT/ML suspension TAKE 5 MLS (500,000 UNITS TOTAL) BY MOUTH FOUR TIMES DAILY. Patient taking differently: Use as directed 5 mLs in the mouth or throat 4 (four) times daily. 11/30/20  11/30/21  Sheikh, Omair Latif, DO  OLANZapine (ZYPREXA) 5 MG tablet TAKE 1 TABLET (5 MG TOTAL) BY MOUTH AT BEDTIME. Patient taking differently: Take 5 mg by mouth at bedtime. 11/30/20 11/30/21  Raiford Noble Latif, DO  pantoprazole (PROTONIX) 40 MG tablet TAKE 1 TABLET (40 MG TOTAL) BY MOUTH DAILY. Patient taking differently: Take 40 mg by mouth daily. 11/30/20 11/30/21  Raiford Noble Latif, DO  polyvinyl alcohol (LIQUIFILM TEARS) 1.4 % ophthalmic solution Place 1 drop into both eyes daily as needed for dry eyes. 11/30/20   Raiford Noble Latif, DO  sodium chloride (OCEAN) 0.65 % nasal spray PLACE 1 SPRAY INTO BOTH NOSTRILS AS NEEDED FOR CONGESTION. Patient taking differently: Place 1 spray into the nose daily as needed for congestion. 11/30/20 11/30/21  Raiford Noble Latif, DO  sodium chloride (OCEAN) 0.65 % SOLN nasal spray Place 1 spray into both nostrils as needed for congestion. Patient not taking: Reported on 11/20/2021 11/30/20   Raiford Noble Latif, DO  umeclidinium bromide (INCRUSE ELLIPTA) 62.5 MCG/INH AEPB  INHALE 1 PUFF INTO THE LUNGS DAILY. Patient taking differently: Inhale 1 puff into the lungs daily. 11/30/20 11/30/21  Raiford Noble Latif, DO      Allergies    Nsaids    Review of Systems   Review of Systems  Cardiovascular:  Positive for chest pain.   Physical Exam Updated Vital Signs BP (!) 146/86 (BP Location: Left Arm)    Pulse 90    Temp 98.1 F (36.7 C) (Oral)    Resp 18    Ht 5\' 1"  (1.549 m)    Wt 106.6 kg    SpO2 99%    BMI 44.40 kg/m  Physical Exam Vitals and nursing note reviewed. Exam conducted with a chaperone present.  Constitutional:      Appearance: Normal appearance.  HENT:     Head: Normocephalic and atraumatic.  Eyes:     General: No scleral icterus.       Right eye: No discharge.        Left eye: No discharge.     Extraocular Movements: Extraocular movements intact.     Pupils: Pupils are equal, round, and reactive to light.  Cardiovascular:     Rate and  Rhythm: Normal rate and regular rhythm.     Pulses: Normal pulses.     Heart sounds: Normal heart sounds. No murmur heard.   No friction rub. No gallop.  Pulmonary:     Effort: Pulmonary effort is normal. No respiratory distress.     Breath sounds: Normal breath sounds.  Abdominal:     General: Abdomen is flat. Bowel sounds are normal. There is no distension.     Palpations: Abdomen is soft.     Tenderness: There is no abdominal tenderness.  Musculoskeletal:     Right lower leg: No edema.     Left lower leg: No edema.  Skin:    General: Skin is warm and dry.     Coloration: Skin is not jaundiced.  Neurological:     Mental Status: She is alert. Mental status is at baseline.     Coordination: Coordination normal.   ED Results / Procedures / Treatments   Labs (all labs ordered are listed, but only abnormal results are displayed) Labs Reviewed  URINALYSIS, COMPLETE (UACMP) WITH MICROSCOPIC  RAPID URINE DRUG SCREEN, HOSP PERFORMED  TSH  LIPID PANEL  HEMOGLOBIN A1C  COMPREHENSIVE METABOLIC PANEL  CBC WITH DIFFERENTIAL/PLATELET  I-STAT BETA HCG BLOOD, ED ([facility], [facility], [facility] ONLY)  TROPONIN I (HIGH SENSITIVITY)    EKG None  Radiology CT Head Wo Contrast  Result Date: 11/20/2021 CLINICAL DATA:  Headache, chronic, new features or increased frequency. EXAM: CT HEAD WITHOUT CONTRAST TECHNIQUE: Contiguous axial images were obtained from the base of the skull through the vertex without intravenous contrast. RADIATION DOSE REDUCTION: This exam was performed according to the departmental dose-optimization program which includes automated exposure control, adjustment of the mA and/or kV according to patient size and/or use of iterative reconstruction technique. COMPARISON:  Brain MRI 11/11/2020. Brain MRI 11/05/2020. FINDINGS: Brain: Cerebral volume is normal. Known small chronic infarcts within the bilateral cerebral hemispheric white matter, right thalamus, right pons and dorsolateral left  medulla, some of which are better appreciated on the prior MRI examinations 11/11/2020 and 11/10/2020. Background mild chronic small vessel ischemic changes within the cerebral white matter. Partially empty sella turcica. Cavum septum pellucidum and cavum vergae. There is no acute intracranial hemorrhage. No demarcated cortical infarct. No extra-axial fluid collection. No evidence of an intracranial mass.  No midline shift. Vascular: No hyperdense vessel.  Atherosclerotic calcifications. Skull: Normal. Negative for fracture or focal lesion. Sinuses/Orbits: Visualized orbits show no acute finding. No significant paranasal sinus disease at the imaged levels. Other: Trace fluid within the bilateral mastoid air cells. IMPRESSION: No evidence of acute intracranial abnormality. Known small chronic infarcts in the bilateral cerebral hemispheric white matter, right thalamus, right pons and dorsolateral left medulla, some of which were better appreciated on the prior MRI examinations of 11/11/2020 and 11/10/2020. Background mild (but age advanced) cerebral white matter chronic small vessel ischemic disease. Trace fluid within the bilateral mastoid air cells. Electronically Signed   By: Kellie Simmering D.O.   On: 11/20/2021 12:02    Procedures Procedures    Medications Ordered in ED Medications  aspirin EC tablet 325 mg (325 mg Oral Given 11/22/21 0825)  amLODipine (NORVASC) tablet 10 mg (10 mg Oral Given 11/22/21 0825)  atorvastatin (LIPITOR) tablet 40 mg (40 mg Oral Given 11/22/21 0825)  hydrOXYzine (ATARAX) tablet 25 mg (25 mg Oral Given 11/22/21 0824)  OLANZapine (ZYPREXA) tablet 5 mg (5 mg Oral Given 11/21/21 2133)  FLUoxetine (PROZAC) capsule 20 mg (20 mg Oral Given 11/22/21 0824)  pantoprazole (PROTONIX) EC tablet 40 mg (40 mg Oral Given 11/22/21 0824)  clopidogrel (PLAVIX) tablet 75 mg (75 mg Oral Given 11/22/21 0825)  gabapentin (NEURONTIN) capsule 300 mg (300 mg Oral Given 11/22/21 0826)  multivitamin with  minerals tablet 1 tablet (1 tablet Oral Given 11/22/21 0827)  albuterol (VENTOLIN HFA) 108 (90 Base) MCG/ACT inhaler 1-2 puff (1 puff Inhalation Given 11/21/21 1717)  mometasone-formoterol (DULERA) 100-5 MCG/ACT inhaler 2 puff (2 puffs Inhalation Given 11/22/21 0826)  nicotine (NICODERM CQ - dosed in mg/24 hours) patch 21 mg (21 mg Transdermal Not Given 11/22/21 0826)  acetaminophen (TYLENOL) tablet 650 mg (has no administration in time range)  alum & mag hydroxide-simeth (MAALOX/MYLANTA) 200-200-20 MG/5ML suspension 30 mL (has no administration in time range)  magnesium hydroxide (MILK OF MAGNESIA) suspension 30 mL (has no administration in time range)  influenza vac split quadrivalent PF (FLUARIX) injection 0.5 mL (has no administration in time range)    ED Course/ Medical Decision Making/ A&P                           Medical Decision Making Amount and/or Complexity of Data Reviewed Labs: ordered. Radiology: ordered.   This is a 51 year old female presenting with chest pain.  Vitals are stable, she is not tachypneic, tachycardic or hypoxic.  Lungs are clear to auscultation, additionally not having any viral symptoms.  Not febrile, not coughing.  Chest pain work-up initiated to evaluate for ACS. Heart score 4. Doubt PE given no hypoxia or tachycardia  but a consideration. Considered dissection but think less likely. 2+radial pulse, not HTN.   Additional history obtained: -External records from outside source obtained and reviewed including: Chart review including previous notes, labs, imaging, consultation notes   Lab Tests: -I ordered, reviewed, and interpreted labs.  The pertinent results include:   CBC without leukocytosis or anemia.   CMP shows no gross electrolyte derangement or AKI. Troponin: Negative delta Trope   EKG -Normal sinus rhythm, some T wave inversions but not acutely compared to prior.  No ST elevation or depression concerning for acute ischemia.   Imaging Studies  ordered: -I ordered imaging studies including chest x-ray -I independently visualized and interpreted imaging which showed left lower lobe opacity.  Consistent with atelectasis.  There is no consolidation on exam, not febrile or coughing.  I suspect this is atelectasis rather than an acute pneumonia. -I agree with the radiologist interpretation   Cardiac Monitoring: The patient was maintained on a cardiac monitor.  I personally viewed and interpreted the cardiac monitored which showed an underlying rhythm of: NSR    Dispostion: At this time I do not feel patient needs additional work-up for chest pain.  She has not had chest pain since administration of aspirin, additionally negative delta Trope and no ischemic findings on EKG make acute ACS unlikely.  Discussed with patient, at this time I feel she is appropriate for return to [name].         Final Clinical Impression(s) / ED Diagnoses Final diagnoses:  None    Rx / DC Orders ED Discharge Orders     None         Sherrill Raring, [place] 11/22/21 [place], MD 11/22/21 1540

## 2021-11-22 NOTE — Progress Notes (Signed)
°   11/21/21 2130  Psych Admission Type (Psych Patients Only)  Admission Status Voluntary  Psychosocial Assessment  Patient Complaints Sadness;Depression  Eye Contact Fair  Facial Expression Sad;Worried;Anxious  Affect Anxious;Depressed;Sad  Speech Logical/coherent  Interaction Assertive  Motor Activity Unsteady;Slow  Appearance/Hygiene Disheveled;Poor hygiene;In scrubs  Behavior Characteristics Cooperative;Appropriate to situation  Mood Anxious;Sad;Depressed  Thought Process  Coherency Concrete thinking  Content Blaming others;Blaming self  Delusions None reported or observed  Perception WDL  Hallucination None reported or observed  Judgment Poor  Confusion None  Danger to Self  Current suicidal ideation? Passive  Self-Injurious Behavior No self-injurious ideation or behavior indicators observed or expressed   Agreement Not to Harm Self Yes  Description of Agreement Verbal Contract  Danger to Others  Danger to Others None reported or observed

## 2021-11-22 NOTE — H&P (Addendum)
Psychiatric Admission Assessment Adult  Patient Identification: Cathy Hall MRN:  [id number] Date of Evaluation:  11/22/2021 Chief Complaint:  SI and worsening depression Principal Diagnosis: MDD (major depressive disorder), recurrent, severe, with psychosis (HCC) Diagnosis:  Principal Problem:   MDD (major depressive disorder), recurrent, severe, with psychosis (HCC) Active Problems:   Cocaine abuse (HCC)  History of Present Illness: Cathy Hall is a 51 yo patient w. PPH of MDD and PTSD who presented to [facility] endorsing SI with recent use of cocaine and BP of 200/100.During patient intake, patient EKG resulted abnormal T waves concerning for ischemia and patient endorsed chest pain, patient transferred to [facility] via EMS for Medical Eval and clearance. Patient was evaluated for MI and was medically cleared and transported back to [facility].   Assessment at [facility] was truncated due to patient's acuity regarding chest pain and abnormal EKG. On assessment patient reports that she has been feeling down and depressed because the anniversaries and birthdays of her deceased son and her deceased twin have recently passed. Patient reports that there are other "things" that have been going on that are hard, but she points to the anniversaries and the deaths themselves as stressors. Patient reports that her son died 04/13/19 and her twin sister died  4 months later. Patient reports that her son's bday is 12/28. Patient reports she has been using cocaine daily (1-2 grams smoking) and frequently uses THC (does not quantify an amount).   Patient reports that she has not been sleeping well both due to her depression and because she has not been able to acquire the recommended CPAP. Patient reports she has been anhedonic for some time. Patient also reports she has been feeling hopeless, worthless and guilty for many months. Patient reports that she has been feeling guilty about her son's death despite  the fact that she had no control over his death. Patient reports she has had decrease in her energy and concentration and is only recently felt she is getting her appetite back. Patient reports she has SI intermittently with no plan. Patient reports she has attempted suicide in the past year and recalls stabbing herself and attempting to OD. Patient endorses she has ben hospitalized for these actions. On assessment today patient endorses passive SI, denies HI and AVH. Per admission notes she reported AVH prior to admission which she now denies.   Patient endorses that she has been able to go up to 10 days without need for sleep while also having increased level of energy and irritability . However, patient reports she was using cocaine before and during these episodes. Patient is not able to recall an episode where she was not using cocaine.   Patient reports she has a hx of anxiety but due to truncated assessment further details will have to be assessed at another time.   PTSD was not assessed.   Associated Signs/Symptoms: Depression Symptoms:  depressed mood, anhedonia, insomnia, psychomotor retardation, feelings of worthlessness/guilt, difficulty concentrating, hopelessness, recurrent thoughts of death, suicidal thoughts without plan, loss of energy/fatigue, disturbed sleep, decreased appetite, Duration of Depression Symptoms: Greater than two weeks  (Hypo) Manic Symptoms:   Denies Anxiety Symptoms:   Endorses needing Atarax to assist with anxiety Psychotic Symptoms:   Per ED report patient was having AVH, but she denies current AVH on assessment.  PTSD Symptoms: NA  Total Time spent with patient: 45 minutes  Past Psychiatric History: Endorses at least 1 prior hospitalization around the death of son. Had OP psych in  [place]. Has been to [facility] 08/2020.   Previous medications: Wellbutrin (success), Prozac ( success), Vistaril (success), trazodone (nightmares), Gabapentin (does not  recall what this was for)  Is the patient at risk to self? Yes.    Has the patient been a risk to self in the past 6 months? No.  Has the patient been a risk to self within the distant past? Yes.    Is the patient a risk to others? No.  Has the patient been a risk to others in the past 6 months? No.  Has the patient been a risk to others within the distant past? No.   Alcohol Screening: 1. How often do you have a drink containing alcohol?: Monthly or less 2. How many drinks containing alcohol do you have on a typical day when you are drinking?: 1 or 2 3. How often do you have six or more drinks on one occasion?: Never AUDIT-C Score: 1 4. How often during the last year have you found that you were not able to stop drinking once you had started?: Never 5. How often during the last year have you failed to do what was normally expected from you because of drinking?: Never 6. How often during the last year have you needed a first drink in the morning to get yourself going after a heavy drinking session?: Never 7. How often during the last year have you had a feeling of guilt of remorse after drinking?: Never 8. How often during the last year have you been unable to remember what happened the night before because you had been drinking?: Never 9. Have you or someone else been injured as a result of your drinking?: No 10. Has a relative or friend or a doctor or another health worker been concerned about your drinking or suggested you cut down?: No Alcohol Use Disorder Identification Test Final Score (AUDIT): 1 Substance Abuse History in the last 12 months:  Yes.   Consequences of Substance Abuse: Medical Consequences:  Hx of CVA Previous Psychotropic Medications: Yes  Psychological Evaluations:  Unknown Past Medical History:  Past Medical History:  Diagnosis Date   Anxiety    Asthma    COPD (chronic obstructive pulmonary disease) (HCC)    Depression    Hypertension    Major depression     PTSD (post-traumatic stress disorder)     Past Surgical History:  Procedure Laterality Date   ECTOPIC PREGNANCY SURGERY     Family History:  Family History  Problem Relation Age of Onset   Cerebral aneurysm Mother    Diabetes Sister    Other Neg Hx    Family Psychiatric  History: Not assessed due to acuity on admission  Tobacco Screening:   Not assessed due to acuity on admission  Social History:  Social History   Substance and Sexual Activity  Alcohol Use Yes   Comment: occasional     Social History   Substance and Sexual Activity  Drug Use Yes   Types: Cocaine, Marijuana   Comment: last cocaine use 3 days ago    Additional Social History:       - Cocaine use: 1.5-2g/day -THC use: within "the last 3 days" uses frequently but not as often as cocaine   Allergies:   Allergies  Allergen Reactions   Nsaids Other (See Comments)    Other reaction(s): Kidney Disorder   Lab Results:  Results for orders placed or performed during the hospital encounter of 11/21/21 (from the past 48 hour(s))  I-Stat Beta hCG blood, ED ([facility], [facility], [facility] only)     Status: None   Collection Time: 11/22/21 11:19 AM  Result Value Ref Range   I-stat hCG, quantitative <5.0 <5 mIU/mL   Comment 3            Comment:   GEST. AGE      CONC.  (mIU/mL)   <=1 WEEK        5 - 50     2 WEEKS       50 - 500     3 WEEKS       100 - 10,000     4 WEEKS     1,000 - 30,000        FEMALE AND NON-PREGNANT FEMALE:     LESS THAN 5 mIU/mL   TSH     Status: None   Collection Time: 11/22/21 11:27 AM  Result Value Ref Range   TSH 0.668 0.350 - 4.500 uIU/mL    Comment: Performed by a 3rd Generation assay with a functional sensitivity of <=0.01 uIU/mL. Performed at [facility], [place] [address] [place], [place] [address]   Comprehensive metabolic panel     Status: Abnormal   Collection Time: 11/22/21 11:27 AM  Result Value Ref Range   Sodium 143 135 - 145 mmol/L   Potassium 4.5 3.5 - 5.1 mmol/L   Chloride  107 98 - 111 mmol/L   CO2 23 22 - 32 mmol/L   Glucose, Bld 141 (H) 70 - 99 mg/dL    Comment: Glucose reference range applies only to samples taken after fasting for at least 8 hours.   BUN 13 6 - 20 mg/dL   Creatinine, Ser 0.89 0.44 - 1.00 mg/dL   Calcium 9.4 8.9 - 10.3 mg/dL   Total Protein 6.6 6.5 - 8.1 g/dL   Albumin 3.3 (L) 3.5 - 5.0 g/dL   AST 17 15 - 41 U/L   ALT 11 0 - 44 U/L   Alkaline Phosphatase 70 38 - 126 U/L   Total Bilirubin 0.4 0.3 - 1.2 mg/dL   GFR, Estimated >60 >60 mL/min    Comment: (NOTE) Calculated using the CKD-EPI Creatinine Equation (2021)    Anion gap 13 5 - 15    Comment: Performed at [facility] [address] [place], [place] [address]  CBC with Differential     Status: None   Collection Time: 11/22/21 11:27 AM  Result Value Ref Range   WBC 8.1 4.0 - 10.5 K/uL   RBC 4.65 3.87 - 5.11 MIL/uL   Hemoglobin 13.8 12.0 - 15.0 g/dL   HCT 43.7 36.0 - 46.0 %   MCV 94.0 80.0 - 100.0 fL   MCH 29.7 26.0 - 34.0 pg   MCHC 31.6 30.0 - 36.0 g/dL   RDW 14.2 11.5 - 15.5 %   Platelets 218 150 - 400 K/uL   nRBC 0.0 0.0 - 0.2 %   Neutrophils Relative % 62 %   Neutro Abs 5.0 1.7 - 7.7 K/uL   Lymphocytes Relative 28 %   Lymphs Abs 2.3 0.7 - 4.0 K/uL   Monocytes Relative 7 %   Monocytes Absolute 0.5 0.1 - 1.0 K/uL   Eosinophils Relative 2 %   Eosinophils Absolute 0.2 0.0 - 0.5 K/uL   Basophils Relative 1 %   Basophils Absolute 0.0 0.0 - 0.1 K/uL   Immature Granulocytes 0 %   Abs Immature Granulocytes 0.03 0.00 - 0.07 K/uL  Comment: Performed at [facility], [place] [address], [place], [place] [address]  Troponin I (High Sensitivity)     Status: None   Collection Time: 11/22/21 11:27 AM  Result Value Ref Range   Troponin I (High Sensitivity) 15 <18 ng/L    Comment: (NOTE) Elevated high sensitivity troponin I (hsTnI) values and significant  changes across serial measurements may suggest ACS but many other  chronic and acute conditions are known to  elevate hsTnI results.  Refer to the "Links" section for chest pain algorithms and additional  guidance. Performed at [facility], [place] [address] [place], [place] [address]   Lipid panel     Status: Abnormal   Collection Time: 11/22/21 11:28 AM  Result Value Ref Range   Cholesterol 161 0 - 200 mg/dL   Triglycerides 280 (H) <150 mg/dL   HDL 40 (L) >40 mg/dL   Total CHOL/HDL Ratio 4.0 RATIO   VLDL 56 (H) 0 - 40 mg/dL   LDL Cholesterol 65 0 - 99 mg/dL    Comment:        Total Cholesterol/HDL:CHD Risk Coronary Heart Disease Risk Table                     Men   Women  1/2 Average Risk   3.4   3.3  Average Risk       5.0   4.4  2 X Average Risk   9.6   7.1  3 X Average Risk  23.4   11.0        Use the calculated Patient Ratio above and the CHD Risk Table to determine the patient's CHD Risk.        ATP III CLASSIFICATION (LDL):  <100     mg/dL   Optimal  100-129  mg/dL   Near or Above                    Optimal  130-159  mg/dL   Borderline  160-189  mg/dL   High  >190     mg/dL   Very High Performed at [facility] [address], [place], [place] [address]   Hemoglobin A1c     Status: Abnormal   Collection Time: 11/22/21 11:29 AM  Result Value Ref Range   Hgb A1c MFr Bld 6.5 (H) 4.8 - 5.6 %    Comment: (NOTE) Pre diabetes:          5.7%-6.4%  Diabetes:              >6.4%  Glycemic control for   <7.0% adults with diabetes    Mean Plasma Glucose 139.85 mg/dL    Comment: Performed at [facility] [address] [place], [place] [address]  Urinalysis, Complete w Microscopic Urine, Clean Catch     Status: Abnormal   Collection Time: 11/22/21 11:44 AM  Result Value Ref Range   Color, Urine YELLOW YELLOW   APPearance CLEAR CLEAR   Specific Gravity, Urine 1.020 1.005 - 1.030   pH 6.0 5.0 - 8.0   Glucose, UA NEGATIVE NEGATIVE mg/dL   Hgb urine dipstick TRACE (A) NEGATIVE   Bilirubin Urine NEGATIVE NEGATIVE   Ketones, ur NEGATIVE NEGATIVE mg/dL    Protein, ur NEGATIVE NEGATIVE mg/dL   Nitrite NEGATIVE NEGATIVE   Leukocytes,Ua MODERATE (A) NEGATIVE   Squamous Epithelial / LPF 6-10 0 - 5   WBC, UA 6-10 0 - 5 WBC/hpf  RBC / HPF 6-10 0 - 5 RBC/hpf   Bacteria, UA RARE (A) NONE SEEN    Comment: Performed at [facility], [place] [address], [place], [place] [address]  Rapid urine drug screen (hospital performed)     Status: Abnormal   Collection Time: 11/22/21 11:44 AM  Result Value Ref Range   Opiates NONE DETECTED NONE DETECTED   Cocaine POSITIVE (A) NONE DETECTED   Benzodiazepines NONE DETECTED NONE DETECTED   Amphetamines NONE DETECTED NONE DETECTED   Tetrahydrocannabinol NONE DETECTED NONE DETECTED   Barbiturates NONE DETECTED NONE DETECTED    Comment: (NOTE) DRUG SCREEN FOR MEDICAL PURPOSES ONLY.  IF CONFIRMATION IS NEEDED FOR ANY PURPOSE, NOTIFY LAB WITHIN 5 DAYS.  LOWEST DETECTABLE LIMITS FOR URINE DRUG SCREEN Drug Class                     Cutoff (ng/mL) Amphetamine and metabolites    1000 Barbiturate and metabolites    200 Benzodiazepine                 [id number] Tricyclics and metabolites     300 Opiates and metabolites        300 Cocaine and metabolites        300 THC                            50 Performed at [facility], [place] [address], [place], [place] [address]   Troponin I (High Sensitivity)     Status: None   Collection Time: 11/22/21  1:00 PM  Result Value Ref Range   Troponin I (High Sensitivity) 13 <18 ng/L    Comment: (NOTE) Elevated high sensitivity troponin I (hsTnI) values and significant  changes across serial measurements may suggest ACS but many other  chronic and acute conditions are known to elevate hsTnI results.  Refer to the "Links" section for chest pain algorithms and additional  guidance. Performed at [facility], [place] [address], [place], [place] [address]     Blood Alcohol level:  Lab Results  Component Value Date   [facility] <10 11/20/2021   ETH <10 0000000     Metabolic Disorder Labs:  Lab Results  Component Value Date   HGBA1C 6.5 (H) 11/22/2021   MPG 139.85 11/22/2021   MPG 111.15 11/06/2020   No results found for: PROLACTIN Lab Results  Component Value Date   CHOL 161 11/22/2021   TRIG 280 (H) 11/22/2021   HDL 40 (L) 11/22/2021   CHOLHDL 4.0 11/22/2021   VLDL 56 (H) 11/22/2021   LDLCALC 65 11/22/2021   LDLCALC 113 (H) 11/06/2020    Current Medications: Current Facility-Administered Medications  Medication Dose Route Frequency Provider Last Rate Last Admin   acetaminophen (TYLENOL) tablet 650 mg  650 mg Oral Q6H PRN Rankin, Shuvon B, NP       albuterol (VENTOLIN HFA) 108 (90 Base) MCG/ACT inhaler 1-2 puff  1-2 puff Inhalation Q4H PRN Rankin, Shuvon B, NP   1 puff at 11/21/21 1717   alum & mag hydroxide-simeth (MAALOX/MYLANTA) 200-200-20 MG/5ML suspension 30 mL  30 mL Oral Q4H PRN Rankin, Shuvon B, NP       amLODipine (NORVASC) tablet 10 mg  10 mg Oral Daily Rankin, Shuvon B, NP   10 mg at 11/22/21 0825   aspirin EC tablet 325 mg  325 mg Oral Daily Rankin, Shuvon B, NP   325 mg  at 11/22/21 0825   atorvastatin (LIPITOR) tablet 40 mg  40 mg Oral Daily Rankin, Shuvon B, NP   40 mg at 11/22/21 0825   clopidogrel (PLAVIX) tablet 75 mg  75 mg Oral Daily Rankin, Shuvon B, NP   75 mg at 11/22/21 0825   [START ON 11/23/2021] FLUoxetine (PROZAC) capsule 10 mg  10 mg Oral Daily Damita Dunnings B, MD       gabapentin (NEURONTIN) capsule 300 mg  300 mg Oral TID Rankin, Shuvon B, NP   300 mg at 11/22/21 1727   hydrOXYzine (ATARAX) tablet 25 mg  25 mg Oral TID Rankin, Shuvon B, NP   25 mg at 11/22/21 1727   influenza vac split quadrivalent PF (FLUARIX) injection 0.5 mL  0.5 mL Intramuscular Tomorrow-1000 Nelda Marseille, Mayar Whittier E, MD       magnesium hydroxide (MILK OF MAGNESIA) suspension 30 mL  30 mL Oral Daily PRN Rankin, Shuvon B, NP       mometasone-formoterol (DULERA) 100-5 MCG/ACT inhaler 2 puff  2 puff Inhalation BID Rankin, Shuvon B, NP   2 puff at  11/22/21 1726   multivitamin with minerals tablet 1 tablet  1 tablet Oral Daily Rankin, Shuvon B, NP   1 tablet at 11/22/21 0827   nicotine (NICODERM CQ - dosed in mg/24 hours) patch 21 mg  21 mg Transdermal Daily Nkwenti, Doris, NP       OLANZapine (ZYPREXA) tablet 5 mg  5 mg Oral QHS McQuilla, Jai B, MD       pantoprazole (PROTONIX) EC tablet 40 mg  40 mg Oral Daily Rankin, Shuvon B, NP   40 mg at 11/22/21 [id number]   PTA Medications: Medications Prior to Admission  Medication Sig Dispense Refill Last Dose   acetaminophen (TYLENOL) 325 MG tablet TAKE 2 TABLETS (650 MG TOTAL) BY MOUTH EVERY FOUR HOURS AS NEEDED FOR MILD PAIN (OR TEMP > 37.5 C (99.5 F)). (Patient taking differently: Take 650 mg by mouth every 4 (four) hours as needed for mild pain or fever.) 30 tablet 0    albuterol (VENTOLIN HFA) 108 (90 Base) MCG/ACT inhaler INHALE 2 PUFFS INTO THE LUNGS EVERY FOUR HOURS AS NEEDED FOR WHEEZING OR SHORTNESS OF BREATH. (Patient taking differently: Inhale 1-2 puffs into the lungs every 4 (four) hours as needed for wheezing or shortness of breath.) 18 g 0    amLODipine (NORVASC) 10 MG tablet Take 10 mg by mouth daily.      amLODipine (NORVASC) 5 MG tablet TAKE 1 TABLET (5 MG TOTAL) BY MOUTH DAILY. (Patient not taking: Reported on 11/20/2021) 90 tablet 0    aspirin 325 MG EC tablet TAKE 1 TABLET (325 MG TOTAL) BY MOUTH DAILY. (Patient taking differently: Take 325 mg by mouth daily.) 30 tablet 0    aspirin 325 MG tablet Take 1 tablet (325 mg total) by mouth daily. (Patient not taking: Reported on 11/20/2021) 30 tablet 0    atorvastatin (LIPITOR) 40 MG tablet TAKE 1 TABLET (40 MG TOTAL) BY MOUTH DAILY. (Patient taking differently: Take 40 mg by mouth daily.) 90 tablet 0    benzocaine (ORAJEL) 10 % mucosal gel USE AS DIRECTED IN THE MOUTH OR THROAT FOUR TIMES DAILY AS NEEDED FOR MOUTH PAIN. (Patient taking differently: Use as directed 1 application in the mouth or throat 4 (four) times daily as needed for mouth  pain.) 9 g 0    Benzocaine-Menthol 15-3.6 MG LOZG TAKE 1 LOZENGE (3 MG TOTAL) BY MOUTH AS NEEDED FOR SORE THROAT. (  Patient taking differently: Take 1 lozenge by mouth 4 (four) times daily as needed (sore throat).) 100 lozenge 12    clopidogrel (PLAVIX) 75 MG tablet TAKE 1 TABLET (75 MG TOTAL) BY MOUTH DAILY. (Patient taking differently: Take 75 mg by mouth daily.) 30 tablet 0    diclofenac Sodium (VOLTAREN) 1 % GEL APPLY 1 APPLICATION TOPICALLY FOUR TIMES DAILY. (Patient taking differently: Apply 4 g topically 4 (four) times daily.) 100 g 0    FLUoxetine (PROZAC) 10 MG capsule TAKE 1 CAPSULE (10 MG TOTAL) BY MOUTH DAILY. (Patient taking differently: Take 10 mg by mouth daily.) 30 capsule 0    fluticasone (FLONASE) 50 MCG/ACT nasal spray PLACE 2 SPRAYS INTO BOTH NOSTRILS DAILY. 16 g 0    gabapentin (NEURONTIN) 300 MG capsule TAKE 1 CAPSULE (300 MG TOTAL) BY MOUTH THREE TIMES DAILY. (Patient taking differently: Take 300 mg by mouth 3 (three) times daily.) 90 capsule 0    Glycerin-Hypromellose-PEG 400 0.2-0.2-1 % SOLN PLACE 1 DROP INTO BOTH EYES DAILY AS NEEDED FOR DRY EYES. (Patient taking differently: Place 1 drop into both eyes 2 (two) times daily as needed (dry eyes).) 15 mL 0    hydrOXYzine (ATARAX/VISTARIL) 25 MG tablet TAKE 1 TABLET (25 MG TOTAL) BY MOUTH THREE TIMES DAILY AS NEEDED FOR ANXIETY. (Patient taking differently: Take 25 mg by mouth 3 (three) times daily.) 90 tablet 0    loratadine (CLARITIN) 10 MG tablet TAKE 1 TABLET (10 MG TOTAL) BY MOUTH DAILY. (Patient taking differently: Take 10 mg by mouth daily.) 30 tablet 0    Maltodextrin-Xanthan Gum (RESOURCE THICKENUP CLEAR) POWD TAKE BY MOUTH AS DIRECTED (Patient not taking: Reported on 11/20/2021) 125 g 0    menthol-cetylpyridinium (CEPACOL) 3 MG lozenge Take 1 lozenge (3 mg total) by mouth as needed for sore throat. (Patient not taking: Reported on 11/20/2021) 100 tablet 12    mometasone-formoterol (DULERA) 100-5 MCG/ACT AERO INHALE 2 PUFFS  INTO THE LUNGS TWO TIMES DAILY. 13 g 0    montelukast (SINGULAIR) 10 MG tablet TAKE 1 TABLET (10 MG TOTAL) BY MOUTH AT BEDTIME. (Patient taking differently: Take 10 mg by mouth at bedtime.) 30 tablet 0    Multiple Vitamin (MULTIVITAMIN WITH MINERALS) TABS tablet Take 1 tablet by mouth daily. 30 tablet 0    nystatin (MYCOSTATIN) 100000 UNIT/ML suspension TAKE 5 MLS (500,000 UNITS TOTAL) BY MOUTH FOUR TIMES DAILY. (Patient taking differently: Use as directed 5 mLs in the mouth or throat 4 (four) times daily.) 60 mL 0    OLANZapine (ZYPREXA) 5 MG tablet TAKE 1 TABLET (5 MG TOTAL) BY MOUTH AT BEDTIME. (Patient taking differently: Take 5 mg by mouth at bedtime.) 30 tablet 0    pantoprazole (PROTONIX) 40 MG tablet TAKE 1 TABLET (40 MG TOTAL) BY MOUTH DAILY. (Patient taking differently: Take 40 mg by mouth daily.) 90 tablet 0    polyvinyl alcohol (LIQUIFILM TEARS) 1.4 % ophthalmic solution Place 1 drop into both eyes daily as needed for dry eyes. 15 mL 0    sodium chloride (OCEAN) 0.65 % nasal spray PLACE 1 SPRAY INTO BOTH NOSTRILS AS NEEDED FOR CONGESTION. (Patient taking differently: Place 1 spray into the nose daily as needed for congestion.) 88 mL 0    sodium chloride (OCEAN) 0.65 % SOLN nasal spray Place 1 spray into both nostrils as needed for congestion. (Patient not taking: Reported on 11/20/2021) 88 mL 0    umeclidinium bromide (INCRUSE ELLIPTA) 62.5 MCG/INH AEPB INHALE 1 PUFF INTO THE LUNGS DAILY. (  Patient taking differently: Inhale 1 puff into the lungs daily.) 30 each 0     Musculoskeletal: Strength & Muscle Tone: decreased Gait & Station:  has wheelchair and walker at baseline Patient leans: N/A  Psychiatric Specialty Exam:  Presentation  General Appearance: Disheveled (wearing hospital surgical cap becuase her headscarf was taken, stomach is hanging out clothes and patient appears to not care, foul odor upon entering room)  Eye Contact:Poor  Speech:Clear and Coherent; Slow  Speech  Volume:Decreased  Handedness:Right   Mood and Affect  Mood:Dysphoric; Depressed  Affect:Depressed   Thought Process  Thought Processes:Goal Directed  Orientation:Full (Time, Place and Person)  Thought Content: Denies AVH and is not grossly responding to internal/external stimuli on assessment; reports passive SI but can contract for safety; denies HI; unable to assess further due to acuity at time of assessment  Hallucinations: Denied today  Ideas of Reference:Unable to assess due to patient acuity on admission  Suicidal Thoughts:Passive without intent or plan and contracts for safety on the unit  Homicidal Thoughts: Denied  Sensorium  Memory:Immediate Good; Recent Good  Judgment:-- (Improving)  Insight:Shallow   Executive Functions  Concentration:Poor  Attention Span:Poor  [place]   Psychomotor Activity  Psychomotor Activity:Psychomotor Activity: Decreased   Assets  Assets:Resilience   Physical Exam Vitals reviewed.  Constitutional:      Appearance: She is obese.  HENT:     Head: Normocephalic.  Pulmonary:     Effort: Pulmonary effort is normal.  Skin:    General: Skin is warm and dry.  Neurological:     General: No focal deficit present.     Mental Status: She is alert.   Review of Systems  Respiratory:  Positive for shortness of breath.   Cardiovascular:  Positive for chest pain.  Gastrointestinal:  Positive for nausea.  Psychiatric/Behavioral:  Positive for depression, substance abuse and suicidal ideas. Negative for hallucinations. The patient has insomnia.   Otherwise uncooperative for questioning on admission secondary to acuity of presentation.  Blood pressure (!) 156/112, pulse 92, temperature 98.4 F (36.9 C), temperature source Oral, resp. rate 16, height 5\' 1"  (1.549 m), weight 106.6 kg, SpO2 99 %. Body mass index is 44.4 kg/m.  Treatment Plan Summary: Daily contact with patient to  assess and evaluate symptoms and progress in treatment and Medication management Labs: UDS (+) Cocaine, Trp x2 (-), UA ( Rare bacteria, Mod WBC, trace Hgb), A1c:6.5, Lipid panel: TG 280/HDL 40/VLDL 56, CBC- WNL, CMP-Albumin 3.3  EKG : QTC 420, w/ ST t wave abnormality  Cathy Hall- Cathy Hall is a 51 yo patient w. PPH of MDD and PTSD who appears to present with a current episode of MDD in the context of cocaine and THC use and recent anniversaries of losses. On assessment patient appears very depressed and has SI. Patient does not screen positive for manic/hypomanic episodes outside context of previous cocaine use. Patient would benefit from restarting her medication while also getting therapy to help cope with her loss and life. There is some mention of AVH in ED notes prior to admission it is possible that this is 2/2 to her recent cocaine use; however will continue to monitor. Unsure at this time patient's level of motivation to quit substance use will continue to reassess.   MDD, recurrent, severe w/ psychotic features ( R/O SIMD vs SIPD) Hx of PTSD - Start Prozac 10mg  (previous home dose was 20mg ) - Restart home Vistaril 25mg  TID PRN anxiety - Continue home  gabapentin 300mg  TID - Restart home medication Zyprexa 5mg  QHS  Stimulant use disorder, severe, cocaine Cannabis use disorder - counseled on need to abstain from use - will discuss outpatient SA treatment options when she can cooperate for discussion  CVA history:  - continue home Plavix 75mg , hx of CVA - Continue home ASA, 81 mg, hx of CVA  HLD -- Continue home Lipitor 40mg   Tobacco use disorder - 21mg  patch  GERD - Protonix 40mg  daily  COPD - Continue home Dulera 2 puffs bid - Continue home Incruse Ellipta 1 puff daily - Continue home Albuterol PRN - Continue home Singulair 10mg  daily   HTN - Norvasc 10mg  daily - Clonidine 0.1mg  q8 hours PRN SBP>160 or DBP>100; hold for HR <60  CP - resolved - Sent to ED with  negative Troponin and CXR shows atelectasis; EKG unchanged compared to previous tracing - per ED no further w/u indicated at this time  Elevated HbgA1c 6.5 - will discuss start of metformin with patient   Abnormal UA - Urine culture ordered  Mobility issues - PT consult ordered; continue wheelchair and walker  Safety: Q42min checks  Physician Treatment Plan for Primary Diagnosis: MDD (major depressive disorder), recurrent, severe, with psychosis ([place]) Long Term Goal(s): Improvement in symptoms so as ready for discharge  Short Term Goals: Ability to identify changes in lifestyle to reduce recurrence of condition will improve, Ability to verbalize feelings will improve, Ability to disclose and discuss suicidal ideas, Ability to demonstrate self-control will improve, Ability to identify and develop effective coping behaviors will improve, Ability to maintain clinical measurements within normal limits will improve, Compliance with prescribed medications will improve, and Ability to identify triggers associated with substance abuse/mental health issues will improve  Physician Treatment Plan for Secondary Diagnosis: Principal Problem:   MDD (major depressive disorder), recurrent, severe, with psychosis ([place]) Active Problems:   Cocaine abuse ([place])  Long Term Goal(s): Improvement in symptoms so as ready for discharge  Short Term Goals: Ability to identify changes in lifestyle to reduce recurrence of condition will improve, Ability to verbalize feelings will improve, Ability to disclose and discuss suicidal ideas, Ability to demonstrate self-control will improve, Ability to identify and develop effective coping behaviors will improve, Ability to maintain clinical measurements within normal limits will improve, Compliance with prescribed medications will improve, and Ability to identify triggers associated with substance abuse/mental health issues will improve  I certify that inpatient services  furnished can reasonably be expected to improve the patient's condition.     PGY-2 Damita Dunnings, MD 1/18/20235:36 PM

## 2021-11-22 NOTE — Discharge Instructions (Signed)
Your work-up today was reassuring.  There is no signs of heart attack as a source of your chest pain.  He can continue taking aspirin as needed for pain, follow-up with your primary care doctor when able.

## 2021-11-22 NOTE — Plan of Care (Addendum)
Notified by nursing that while doing EKG, the reading was concerning for possible ischemic changes. I talked with the patient and she reports CP this morning with associated nausea and SOB. She cannot give specific details of CP in terms of radiation, how long it lasted, or associated symptoms and is vague and evasive in her description. She denies cardiac history. She admits to smoking cocaine 1-2 grams daily with last use the day before hospitalization and use of THC last 3 days ago. Her EKG shows T wave inversion in V1, aVL and V6. Given age, cardiac risk factors, and EKG changes will send to ED for cardiac workup. I called the EDP at [facility] and provided collateral. EMS called by nursing.   Bartholomew Crews, MD, Celene Skeen

## 2021-11-22 NOTE — Progress Notes (Addendum)
D- Patient alert and oriented x4. Patient has unsteady gait and requires standby assistance when walking. Pt also has hx of COPD and Asthma and has dyspnea with exertion/ambulating. Pt requires set up with her assistive devices to get out of bed safety.    A- Pt educated to call staff every time she has to get up, so staff can be at stand by when she gets up. Assistive devices such as wheelchair and walker used to help patient get around. PT Consult recommended for patient.    R- Patient compliant and verbalized understanding of fall prevention.   11/21/21 2130  Mobility  Activity Ambulated with assistance to bathroom  Level of Assistance Standby assist, set-up cues, supervision of patient - no hands on  Assistive Device Four wheel walker;Wheelchair  Activity Response Tolerated poorly  Energy manager  Level of Assistance Minimal assist  Nutrition  Patient's Current Diet Regular  Appetite Good  Feeding Able to feed self

## 2021-11-22 NOTE — Progress Notes (Signed)
Patient left [facility] for [facility] ED via ambulance.  No pain/discomfort voiced by patient.

## 2021-11-22 NOTE — Progress Notes (Signed)
Patient did not attend wrap up group. 

## 2021-11-22 NOTE — ED Triage Notes (Signed)
Patient bib ems from [facility] due to cocaine and marijuana abuse with SI now complaiing of chest pain that woke her up at 0630.  Upon ems arrival patient was pain free.  Receieved ASA 324mg  with EMS

## 2021-11-22 NOTE — Progress Notes (Signed)
°   11/22/21 0545  Sleep  Number of Hours 8.25

## 2021-11-23 ENCOUNTER — Encounter (HOSPITAL_COMMUNITY): Payer: Self-pay

## 2021-11-23 LAB — URINALYSIS, COMPLETE (UACMP) WITH MICROSCOPIC
Bilirubin Urine: NEGATIVE
Glucose, UA: NEGATIVE mg/dL
Hgb urine dipstick: NEGATIVE
Ketones, ur: NEGATIVE mg/dL
Nitrite: NEGATIVE
Protein, ur: NEGATIVE mg/dL
Specific Gravity, Urine: 1.015 (ref 1.005–1.030)
pH: 6 (ref 5.0–8.0)

## 2021-11-23 LAB — RAPID URINE DRUG SCREEN, HOSP PERFORMED
Amphetamines: NOT DETECTED
Barbiturates: NOT DETECTED
Benzodiazepines: NOT DETECTED
Cocaine: POSITIVE — AB
Opiates: NOT DETECTED
Tetrahydrocannabinol: NOT DETECTED

## 2021-11-23 MED ORDER — METFORMIN HCL 500 MG PO TABS
500.0000 mg | ORAL_TABLET | Freq: Every day | ORAL | Status: DC
  Administered 2021-11-24 – 2021-11-29 (×6): 500 mg via ORAL
  Filled 2021-11-23 (×5): qty 1
  Filled 2021-11-23: qty 7
  Filled 2021-11-23 (×3): qty 1

## 2021-11-23 MED ORDER — NICOTINE 14 MG/24HR TD PT24
14.0000 mg | MEDICATED_PATCH | Freq: Every day | TRANSDERMAL | Status: DC
  Administered 2021-11-24 – 2021-11-29 (×6): 14 mg via TRANSDERMAL
  Filled 2021-11-23 (×8): qty 1

## 2021-11-23 NOTE — Group Note (Unsigned)
Date:  11/23/2021 °Time:  2:04 PM ° °Group Topic/Focus:  °Orientation:   The focus of this group is to educate the patient on the purpose and policies of crisis stabilization and provide a format to answer questions about their admission.  The group details unit policies and expectations of patients while admitted. ° ° ° ° °Participation Level:  {[facility] PARTICIPATION LEVEL:22264} ° °Participation Quality:  {[facility] PARTICIPATION QUALITY:22265} ° °Affect:  {[facility] AFFECT:22266} ° °Cognitive:  {[facility] COGNITIVE:22267} ° °Insight: {[facility] Insight2:20797} ° °Engagement in Group:  {[facility] ENGAGEMENT IN GROUP:22268} ° °Modes of Intervention:  {[facility] MODES OF INTERVENTION:22269} ° °Additional Comments:  *** ° °[name] °11/23/2021, 2:04 PM ° °
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
## 2021-11-23 NOTE — [facility] Counselor (Signed)
After assessing patient, CSW collected resources for the following services:  DV resources NA meetings Shelter resources [employer].    Patient provided resource folder with the above included.     Bartow Zylstra, LCSW, LCAS Clincal Social Worker  [facility]

## 2021-11-23 NOTE — BH IP Treatment Plan (Signed)
Interdisciplinary Treatment and Diagnostic Plan Update  11/23/2021 Time of Session: 9:30am  Cathy Hall MRN: [id number]  Principal Diagnosis: MDD (major depressive disorder), recurrent, severe, with psychosis ([place])  Secondary Diagnoses: Principal Problem:   MDD (major depressive disorder), recurrent, severe, with psychosis ([place]) Active Problems:   Cocaine abuse ([place])   Current Medications:  Current Facility-Administered Medications  Medication Dose Route Frequency Provider Last Rate Last Admin   acetaminophen (TYLENOL) tablet 650 mg  650 mg Oral Q6H PRN Rankin, Shuvon B, NP   650 mg at 11/23/21 0800   albuterol (VENTOLIN HFA) 108 (90 Base) MCG/ACT inhaler 1-2 puff  1-2 puff Inhalation Q4H PRN Rankin, Shuvon B, NP   1 puff at 11/21/21 1717   alum & mag hydroxide-simeth (MAALOX/MYLANTA) 200-200-20 MG/5ML suspension 30 mL  30 mL Oral Q4H PRN Rankin, Shuvon B, NP       amLODipine (NORVASC) tablet 10 mg  10 mg Oral Daily Rankin, Shuvon B, NP   10 mg at 11/23/21 0756   aspirin EC tablet 325 mg  325 mg Oral Daily Rankin, Shuvon B, NP   325 mg at 11/23/21 0756   atorvastatin (LIPITOR) tablet 40 mg  40 mg Oral Daily Rankin, Shuvon B, NP   40 mg at 11/23/21 0756   cloNIDine (CATAPRES) tablet 0.1 mg  0.1 mg Oral Q8H PRN Nelda Marseille, Amy E, MD       clopidogrel (PLAVIX) tablet 75 mg  75 mg Oral Daily Rankin, Shuvon B, NP   75 mg at 11/23/21 0756   FLUoxetine (PROZAC) capsule 10 mg  10 mg Oral Daily Damita Dunnings B, MD   10 mg at 11/23/21 0756   gabapentin (NEURONTIN) capsule 300 mg  300 mg Oral TID Rankin, Shuvon B, NP   300 mg at 11/23/21 0756   hydrOXYzine (ATARAX) tablet 25 mg  25 mg Oral TID PRN Harlow Asa, MD   25 mg at 11/23/21 0759   influenza vac split quadrivalent PF (FLUARIX) injection 0.5 mL  0.5 mL Intramuscular Tomorrow-1000 Nelda Marseille, Amy E, MD       magnesium hydroxide (MILK OF MAGNESIA) suspension 30 mL  30 mL Oral Daily PRN Rankin, Shuvon B, NP        mometasone-formoterol (DULERA) 100-5 MCG/ACT inhaler 2 puff  2 puff Inhalation BID Rankin, Shuvon B, NP   2 puff at 11/23/21 0755   montelukast (SINGULAIR) tablet 10 mg  10 mg Oral QHS Nelda Marseille, Amy E, MD   10 mg at 11/22/21 2124   multivitamin with minerals tablet 1 tablet  1 tablet Oral Daily Rankin, Shuvon B, NP   1 tablet at 11/23/21 0756   nicotine (NICODERM CQ - dosed in mg/24 hours) patch 21 mg  21 mg Transdermal Daily Nkwenti, Doris, NP   21 mg at 11/23/21 0756   OLANZapine (ZYPREXA) tablet 5 mg  5 mg Oral QHS Damita Dunnings B, MD   5 mg at 11/22/21 2124   pantoprazole (PROTONIX) EC tablet 40 mg  40 mg Oral Daily Rankin, Shuvon B, NP   40 mg at 11/23/21 0756   umeclidinium bromide (INCRUSE ELLIPTA) 62.5 MCG/ACT 1 puff  1 puff Inhalation Daily Nelda Marseille, Amy E, MD   1 puff at 11/23/21 0755   PTA Medications: Medications Prior to Admission  Medication Sig Dispense Refill Last Dose   acetaminophen (TYLENOL) 325 MG tablet TAKE 2 TABLETS (650 MG TOTAL) BY MOUTH EVERY FOUR HOURS AS NEEDED FOR MILD PAIN (OR TEMP > 37.5 C (99.5 F)). (  Patient taking differently: Take 650 mg by mouth every 4 (four) hours as needed for mild pain or fever.) 30 tablet 0   ° albuterol (VENTOLIN HFA) 108 (90 Base) MCG/ACT inhaler INHALE 2 PUFFS INTO THE LUNGS EVERY FOUR HOURS AS NEEDED FOR WHEEZING OR SHORTNESS OF BREATH. (Patient taking differently: Inhale 1-2 puffs into the lungs every 4 (four) hours as needed for wheezing or shortness of breath.) 18 g 0   ° amLODipine (NORVASC) 10 MG tablet Take 10 mg by mouth daily.     ° amLODipine (NORVASC) 5 MG tablet TAKE 1 TABLET (5 MG TOTAL) BY MOUTH DAILY. (Patient not taking: Reported on 11/20/2021) 90 tablet 0   ° aspirin 325 MG EC tablet TAKE 1 TABLET (325 MG TOTAL) BY MOUTH DAILY. (Patient taking differently: Take 325 mg by mouth daily.) 30 tablet 0   ° aspirin 325 MG tablet Take 1 tablet (325 mg total) by mouth daily. (Patient not taking: Reported on 11/20/2021) 30 tablet 0   °  atorvastatin (LIPITOR) 40 MG tablet TAKE 1 TABLET (40 MG TOTAL) BY MOUTH DAILY. (Patient taking differently: Take 40 mg by mouth daily.) 90 tablet 0   ° benzocaine (ORAJEL) 10 % mucosal gel USE AS DIRECTED IN THE MOUTH OR THROAT FOUR TIMES DAILY AS NEEDED FOR MOUTH PAIN. (Patient taking differently: Use as directed 1 application in the mouth or throat 4 (four) times daily as needed for mouth pain.) 9 g 0   ° Benzocaine-Menthol 15-3.6 MG LOZG TAKE 1 LOZENGE (3 MG TOTAL) BY MOUTH AS NEEDED FOR SORE THROAT. (Patient taking differently: Take 1 lozenge by mouth 4 (four) times daily as needed (sore throat).) 100 lozenge 12   ° clopidogrel (PLAVIX) 75 MG tablet TAKE 1 TABLET (75 MG TOTAL) BY MOUTH DAILY. (Patient taking differently: Take 75 mg by mouth daily.) 30 tablet 0   ° diclofenac Sodium (VOLTAREN) 1 % GEL APPLY 1 APPLICATION TOPICALLY FOUR TIMES DAILY. (Patient taking differently: Apply 4 g topically 4 (four) times daily.) 100 g 0   ° FLUoxetine (PROZAC) 10 MG capsule TAKE 1 CAPSULE (10 MG TOTAL) BY MOUTH DAILY. (Patient taking differently: Take 10 mg by mouth daily.) 30 capsule 0   ° fluticasone (FLONASE) 50 MCG/ACT nasal spray PLACE 2 SPRAYS INTO BOTH NOSTRILS DAILY. 16 g 0   ° gabapentin (NEURONTIN) 300 MG capsule TAKE 1 CAPSULE (300 MG TOTAL) BY MOUTH THREE TIMES DAILY. (Patient taking differently: Take 300 mg by mouth 3 (three) times daily.) 90 capsule 0   ° Glycerin-Hypromellose-PEG 400 0.2-0.2-1 % SOLN PLACE 1 DROP INTO BOTH EYES DAILY AS NEEDED FOR DRY EYES. (Patient taking differently: Place 1 drop into both eyes 2 (two) times daily as needed (dry eyes).) 15 mL 0   ° hydrOXYzine (ATARAX/VISTARIL) 25 MG tablet TAKE 1 TABLET (25 MG TOTAL) BY MOUTH THREE TIMES DAILY AS NEEDED FOR ANXIETY. (Patient taking differently: Take 25 mg by mouth 3 (three) times daily.) 90 tablet 0   ° loratadine (CLARITIN) 10 MG tablet TAKE 1 TABLET (10 MG TOTAL) BY MOUTH DAILY. (Patient taking differently: Take 10 mg by mouth daily.)  30 tablet 0   ° Maltodextrin-Xanthan Gum (RESOURCE THICKENUP CLEAR) POWD TAKE BY MOUTH AS DIRECTED (Patient not taking: Reported on 11/20/2021) 125 g 0   ° menthol-cetylpyridinium (CEPACOL) 3 MG lozenge Take 1 lozenge (3 mg total) by mouth as needed for sore throat. (Patient not taking: Reported on 11/20/2021) 100 tablet 12   ° mometasone-formoterol (DULERA) 100-5 MCG/ACT AERO INHALE   2 PUFFS INTO THE LUNGS TWO TIMES DAILY. 13 g 0   ° montelukast (SINGULAIR) 10 MG tablet TAKE 1 TABLET (10 MG TOTAL) BY MOUTH AT BEDTIME. (Patient taking differently: Take 10 mg by mouth at bedtime.) 30 tablet 0   ° Multiple Vitamin (MULTIVITAMIN WITH MINERALS) TABS tablet Take 1 tablet by mouth daily. 30 tablet 0   ° nystatin (MYCOSTATIN) 100000 UNIT/ML suspension TAKE 5 MLS (500,000 UNITS TOTAL) BY MOUTH FOUR TIMES DAILY. (Patient taking differently: Use as directed 5 mLs in the mouth or throat 4 (four) times daily.) 60 mL 0   ° OLANZapine (ZYPREXA) 5 MG tablet TAKE 1 TABLET (5 MG TOTAL) BY MOUTH AT BEDTIME. (Patient taking differently: Take 5 mg by mouth at bedtime.) 30 tablet 0   ° pantoprazole (PROTONIX) 40 MG tablet TAKE 1 TABLET (40 MG TOTAL) BY MOUTH DAILY. (Patient taking differently: Take 40 mg by mouth daily.) 90 tablet 0   ° polyvinyl alcohol (LIQUIFILM TEARS) 1.4 % ophthalmic solution Place 1 drop into both eyes daily as needed for dry eyes. 15 mL 0   ° sodium chloride (OCEAN) 0.65 % nasal spray PLACE 1 SPRAY INTO BOTH NOSTRILS AS NEEDED FOR CONGESTION. (Patient taking differently: Place 1 spray into the nose daily as needed for congestion.) 88 mL 0   ° sodium chloride (OCEAN) 0.65 % SOLN nasal spray Place 1 spray into both nostrils as needed for congestion. (Patient not taking: Reported on 11/20/2021) 88 mL 0   ° umeclidinium bromide (INCRUSE ELLIPTA) 62.5 MCG/INH AEPB INHALE 1 PUFF INTO THE LUNGS DAILY. (Patient taking differently: Inhale 1 puff into the lungs daily.) 30 each 0   ° ° °Patient Stressors: Financial difficulties    °Health problems   °Medication change or noncompliance   °Substance abuse   °Traumatic event   ° °Patient Strengths: Ability for insight  °Average or above average intelligence  °Motivation for treatment/growth  °Supportive family/friends  ° °Treatment Modalities: Medication Management, Group therapy, Case management,  °1 to 1 session with clinician, Psychoeducation, Recreational therapy. ° ° °Physician Treatment Plan for Primary Diagnosis: MDD (major depressive disorder), recurrent, severe, with psychosis ([facility]) °Long Term Goal(s): Improvement in symptoms so as ready for discharge  ° °Short Term Goals: Ability to identify changes in lifestyle to reduce recurrence of condition will improve °Ability to verbalize feelings will improve °Ability to disclose and discuss suicidal ideas °Ability to demonstrate self-control will improve °Ability to identify and develop effective coping behaviors will improve °Ability to maintain clinical measurements within normal limits will improve °Compliance with prescribed medications will improve °Ability to identify triggers associated with substance abuse/mental health issues will improve ° °Medication Management: Evaluate patient's response, side effects, and tolerance of medication regimen. ° °Therapeutic Interventions: 1 to 1 sessions, Unit Group sessions and Medication administration. ° °Evaluation of Outcomes: Not Met ° °Physician Treatment Plan for Secondary Diagnosis: Principal Problem: °  MDD (major depressive disorder), recurrent, severe, with psychosis ([facility]) °Active Problems: °  Cocaine abuse ([facility]) ° °Long Term Goal(s): Improvement in symptoms so as ready for discharge  ° °Short Term Goals: Ability to identify changes in lifestyle to reduce recurrence of condition will improve °Ability to verbalize feelings will improve °Ability to disclose and discuss suicidal ideas °Ability to demonstrate self-control will improve °Ability to identify and develop effective coping  behaviors will improve °Ability to maintain clinical measurements within normal limits will improve °Compliance with prescribed medications will improve °Ability to identify triggers associated with substance abuse/mental health issues will   improve    ° °Medication Management: Evaluate patient's response, side effects, and tolerance of medication regimen. ° °Therapeutic Interventions: 1 to 1 sessions, Unit Group sessions and Medication administration. ° °Evaluation of Outcomes: Not Met ° ° °RN Treatment Plan for Primary Diagnosis: MDD (major depressive disorder), recurrent, severe, with psychosis (HCC) °Long Term Goal(s): Knowledge of disease and therapeutic regimen to maintain health will improve ° °Short Term Goals: Ability to remain free from injury will improve, Ability to participate in decision making will improve, Ability to verbalize feelings will improve, Ability to disclose and discuss suicidal ideas, and Ability to identify and develop effective coping behaviors will improve ° °Medication Management: RN will administer medications as ordered by provider, will assess and evaluate patient's response and provide education to patient for prescribed medication. RN will report any adverse and/or side effects to prescribing provider. ° °Therapeutic Interventions: 1 on 1 counseling sessions, Psychoeducation, Medication administration, Evaluate responses to treatment, Monitor vital signs and CBGs as ordered, Perform/monitor CIWA, COWS, AIMS and Fall Risk screenings as ordered, Perform wound care treatments as ordered. ° °Evaluation of Outcomes: Not Met ° ° °LCSW Treatment Plan for Primary Diagnosis: MDD (major depressive disorder), recurrent, severe, with psychosis (HCC) °Long Term Goal(s): Safe transition to appropriate next level of care at discharge, Engage patient in therapeutic group addressing interpersonal concerns. ° °Short Term Goals: Engage patient in aftercare planning with referrals and resources,  Increase social support, Increase emotional regulation, Facilitate acceptance of mental health diagnosis and concerns, Identify triggers associated with mental health/substance abuse issues, and Increase skills for wellness and recovery ° °Therapeutic Interventions: Assess for all discharge needs, 1 to 1 time with Social worker, Explore available resources and support systems, Assess for adequacy in community support network, Educate family and significant other(s) on suicide prevention, Complete Psychosocial Assessment, Interpersonal group therapy. ° °Evaluation of Outcomes: Not Met ° ° °Progress in Treatment: °Attending groups: Yes. °Participating in groups: Yes. °Taking medication as prescribed: Yes. °Toleration medication: Yes. °Family/Significant other contact made: Yes, individual(s) contacted:  Daughter-In-Law °Patient understands diagnosis: Yes. °Discussing patient identified problems/goals with staff: Yes. °Medical problems stabilized or resolved: Yes. °Denies suicidal/homicidal ideation: Yes. °Issues/concerns per patient self-inventory: No. ° °New problem(s) identified: No, Describe:  None  ° °New Short Term/Long Term Goal(s): medication stabilization, elimination of SI thoughts, development of comprehensive mental wellness plan.  ° °Patient Goals: "To work on my depression and to feel better"  ° °Discharge Plan or Barriers: Patient recently admitted. CSW will continue to follow and assess for appropriate referrals and possible discharge planning.  ° °Reason for Continuation of Hospitalization: Depression °Hallucinations °Medical Issues °Medication stabilization °Suicidal ideation ° °Estimated Length of Stay: 3 to 5 days  ° ° °Scribe for Treatment Team: ° M , LCSWA °11/23/2021 °11:03 AM °

## 2021-11-23 NOTE — Progress Notes (Signed)
°   11/23/21 0530  Sleep  Number of Hours 7.25

## 2021-11-23 NOTE — Progress Notes (Signed)
Orthopedic Tech Progress Note Patient Details:  Cathy Hall [facility] [birth year] [id number]  Patient ID: [name], female   DOB: 1971-04-25, 51 y.o.   MRN: [id number]  [name] 11/23/2021, 5:44 PM Knee immobilizer delivered to [facility] to be applied to patient by PT staff who requested brace.

## 2021-11-23 NOTE — Group Note (Unsigned)
Date:  11/23/2021 °Time:  1:49 PM ° °Group Topic/Focus:  °Orientation:   The focus of this group is to educate the patient on the purpose and policies of crisis stabilization and provide a format to answer questions about their admission.  The group details unit policies and expectations of patients while admitted. ° ° ° ° °Participation Level:  {[facility] PARTICIPATION LEVEL:22264} ° °Participation Quality:  {[facility] PARTICIPATION QUALITY:22265} ° °Affect:  {[facility] AFFECT:22266} ° °Cognitive:  {[facility] COGNITIVE:22267} ° °Insight: {[facility] Insight2:20797} ° °Engagement in Group:  {[facility] ENGAGEMENT IN GROUP:22268} ° °Modes of Intervention:  {[facility] MODES OF INTERVENTION:22269} ° °Additional Comments:  *** ° °Cathy Hall D Shanieka Blea °11/23/2021, 1:49 PM ° °

## 2021-11-23 NOTE — Progress Notes (Signed)
Psychoeducational Group Note  Date:  11/23/2021 Time:  2218  Group Topic/Focus:  Wrap-Up Group:   The focus of this group is to help patients review their daily goal of treatment and discuss progress on daily workbooks.  Participation Level: Did Not Attend  Participation Quality:  Not Applicable  Affect:  Not Applicable  Cognitive:  Not Applicable  Insight:  Not Applicable  Engagement in Group: Not Applicable  Additional Comments:  The patient did not attend group this evening.   Hazle Coca S 11/23/2021, 10:18 PM

## 2021-11-23 NOTE — [facility] Suicide Risk Assessment (Signed)
[facility] INPATIENT:  Family/Significant Other Suicide Prevention Education  Suicide Prevention Education:  Education Completed; Orvan July,  [phone] (name of family member/significant other) has been identified by the patient as the family member/significant other with whom the patient will be residing, and identified as the person(s) who will aid the patient in the event of a mental health crisis (suicidal ideations/suicide attempt).  With written consent from the patient, the family member/significant other has been provided the following suicide prevention education, prior to the and/or following the discharge of the patient.  CSW spoke with patient daughter in law, Cathy Hall, who reports that patient has had the death of her sister and son which was also her boyfriend.  Cathy Hall reports that patient uses substances which exasperate the depression.  Cathy Hall reports that she has had suicide attempts in the past but that she usually only gets like that when she feels like her world is crumbling around her.  She reports that patient went to a treatment program/halfway house in [place] and was doing very well but then was kicked out.  When asked if the program was [facility], Cathy Hall confirmed that she believed that was the program.  Cathy Hall is unsure if returning back to her boyfriends is the best environment due to her boyfriend also smoking crack.  Cathy Hall also reports that she used to live with her son in [name] but moved to [place] to get away from drugs.  Cathy Hall confirmed that patient does not have access to guns/weapons.    The suicide prevention education provided includes the following: Suicide risk factors Suicide prevention and interventions National Suicide Hotline telephone number [facility] assessment telephone number [name] Emergency Assistance 911 [facility] and/or Residential Mobile Crisis Unit telephone number  Request made of  family/significant other to: Remove weapons (e.g., guns, rifles, knives), all items previously/currently identified as safety concern.   Remove drugs/medications (over-the-counter, prescriptions, illicit drugs), all items previously/currently identified as a safety concern.  The family member/significant other verbalizes understanding of the suicide prevention education information provided.  The family member/significant other agrees to remove the items of safety concern listed above.  Cathy Hall E Alphonso Gregson 11/23/2021, 2:56 PM

## 2021-11-23 NOTE — Progress Notes (Addendum)
[facility] MD Progress Note  11/23/2021 4:03 PM Cathy Hall  MRN:  [id number]  Chief Complaint: SI  Subjective:  Cathy Hall  a 51 yo patient w. PPH of MDD and PTSD who presented to [facility] endorsing SI with recent use of cocaine and BP of 200/100.During patient intake, patient's EKG resulted abnormal T waves concerning for ischemia and patient endorsed chest pain, patient transferred to [facility] via EMS for Medical Eval and clearance. Patient was evaluated for MI and was medically cleared and transported back to [facility].   Case was discussed in the multidisciplinary team. MAR was reviewed and patient was compliant with medications.  She did not require any PRN's for agitation.     Psychiatric Team made the following recommendations yesterday:  - Start Prozac 10mg  (previous home dose was 20mg ) - Restart home Vistaril 25mg  TID PRN anxiety - Continue home gabapentin 300mg  TID - Restart home medication Zyprexa 5mg  QHS  On assessment today patient reports that she did not sleep well last night and her appetite remains poor. Patient reports that she is having passive SI but would alert staff if she felt it was worsening and intended to act. Patient reports that she has not had AVH the last 2 days and is not sure if it was 2/2 to cocaine use. Patient denies that she has AVH frequently after cocaine use. Patient denies feelings of thought insertion or deletion, thought broadcasting, or ideas of reference. Patient also denies  HI.  Patient reports that it is difficult to accomplish ADLs like bathing without assistance, but she is up to doing this task if she has help. Patient endorses that her unilateral weakness 2/2 to CVA is what makes the tasks most difficult.   Patient endorses that she has a hx of PTSD related to her son's death as she saw her son die. Patient reports that she is still struggling with this and her twin's death. Patient reports she is also interested in substance use  treatment to gain sobriety. She denies current cravings or signs of withdrawal.  Principal Problem: MDD (major depressive disorder), recurrent, severe, with psychosis ([place]) Diagnosis: Principal Problem:   MDD (major depressive disorder), recurrent, severe, with psychosis ([place]) Active Problems:   Cocaine abuse ([place])  Total Time Spent in Direct Patient Care:  I personally spent 30 minutes on the unit in direct patient care. The direct patient care time included face-to-face time with the patient, reviewing the patient's chart, communicating with other professionals, and coordinating care. Greater than 50% of this time was spent in counseling or coordinating care with the patient regarding goals of hospitalization, psycho-education, and discharge planning needs.  Past Psychiatric History: See H&P  Past Medical History:  Past Medical History:  Diagnosis Date   Anxiety    Asthma    COPD (chronic obstructive pulmonary disease) (HCC)    Depression    Hypertension    Major depression    PTSD (post-traumatic stress disorder)     Past Surgical History:  Procedure Laterality Date   ECTOPIC PREGNANCY SURGERY     Family History:  Family History  Problem Relation Age of Onset   Cerebral aneurysm Mother    Diabetes Sister    Other Neg Hx    Family Psychiatric  History: Mom: depression Endorses hx of PTSD in some family, Mom and sister both have hx of self-harm, Multiple SA in family as well.  Social History:  Social History   Substance and Sexual Activity  Alcohol Use Yes  Comment: occasional     Social History   Substance and Sexual Activity  Drug Use Yes   Types: Cocaine, Marijuana   Comment: last cocaine use 3 days ago    Social History   Socioeconomic History   Marital status: Significant Other    Spouse name: Not on file   Number of children: Not on file   Years of education: Not on file   Highest education level: Not on file  Occupational History   Not on file   Tobacco Use   Smoking status: Every Day    Packs/day: 1.00    Types: Cigarettes   Smokeless tobacco: Never  Vaping Use   Vaping Use: Never used  Substance and Sexual Activity   Alcohol use: Yes    Comment: occasional   Drug use: Yes    Types: Cocaine, Marijuana    Comment: last cocaine use 3 days ago   Sexual activity: Not Currently  Other Topics Concern   Not on file  Social History Narrative   ** Merged History Encounter **       Social Determinants of Health   Financial Resource Strain: Not on file  Food Insecurity: Not on file  Transportation Needs: Not on file  Physical Activity: Not on file  Stress: Not on file  Social Connections: Not on file   Additional Social History:   Smokes 1/2 PPD tobacco                      Sleep: Poor per patient report - slept 7.25 hours per staff  Appetite:  Poor  Current Medications: Current Facility-Administered Medications  Medication Dose Route Frequency Provider Last Rate Last Admin   acetaminophen (TYLENOL) tablet 650 mg  650 mg Oral Q6H PRN Rankin, Shuvon B, NP   650 mg at 11/23/21 0800   albuterol (VENTOLIN HFA) 108 (90 Base) MCG/ACT inhaler 1-2 puff  1-2 puff Inhalation Q4H PRN Rankin, Shuvon B, NP   1 puff at 11/21/21 1717   alum & mag hydroxide-simeth (MAALOX/MYLANTA) 200-200-20 MG/5ML suspension 30 mL  30 mL Oral Q4H PRN Rankin, Shuvon B, NP       amLODipine (NORVASC) tablet 10 mg  10 mg Oral Daily Rankin, Shuvon B, NP   10 mg at 11/23/21 0756   aspirin EC tablet 325 mg  325 mg Oral Daily Rankin, Shuvon B, NP   325 mg at 11/23/21 0756   atorvastatin (LIPITOR) tablet 40 mg  40 mg Oral Daily Rankin, Shuvon B, NP   40 mg at 11/23/21 0756   cloNIDine (CATAPRES) tablet 0.1 mg  0.1 mg Oral Q8H PRN Nelda Marseille, Amond Speranza E, MD       clopidogrel (PLAVIX) tablet 75 mg  75 mg Oral Daily Rankin, Shuvon B, NP   75 mg at 11/23/21 0756   FLUoxetine (PROZAC) capsule 10 mg  10 mg Oral Daily Damita Dunnings B, MD   10 mg at 11/23/21 0756    gabapentin (NEURONTIN) capsule 300 mg  300 mg Oral TID Rankin, Shuvon B, NP   300 mg at 11/23/21 1215   hydrOXYzine (ATARAX) tablet 25 mg  25 mg Oral TID PRN Harlow Asa, MD   25 mg at 11/23/21 0759   influenza vac split quadrivalent PF (FLUARIX) injection 0.5 mL  0.5 mL Intramuscular Tomorrow-1000 Madalyn Legner E, MD       magnesium hydroxide (MILK OF MAGNESIA) suspension 30 mL  30 mL Oral Daily PRN Rankin, Shuvon B, NP       [  START ON 11/24/2021] metFORMIN (GLUCOPHAGE) tablet 500 mg  500 mg Oral Q breakfast McQuilla, Joyce Gross B, MD       mometasone-formoterol (DULERA) 100-5 MCG/ACT inhaler 2 puff  2 puff Inhalation BID Rankin, Shuvon B, NP   2 puff at 11/23/21 0755   montelukast (SINGULAIR) tablet 10 mg  10 mg Oral QHS Nelda Marseille, Edwyna Dangerfield E, MD   10 mg at 11/22/21 2124   multivitamin with minerals tablet 1 tablet  1 tablet Oral Daily Rankin, Shuvon B, NP   1 tablet at 11/23/21 0756   [START ON 11/24/2021] nicotine (NICODERM CQ - dosed in mg/24 hours) patch 14 mg  14 mg Transdermal Daily Damita Dunnings B, MD       OLANZapine (ZYPREXA) tablet 5 mg  5 mg Oral QHS Damita Dunnings B, MD   5 mg at 11/22/21 2124   pantoprazole (PROTONIX) EC tablet 40 mg  40 mg Oral Daily Rankin, Shuvon B, NP   40 mg at 11/23/21 0756   umeclidinium bromide (INCRUSE ELLIPTA) 62.5 MCG/ACT 1 puff  1 puff Inhalation Daily Harlow Asa, MD   1 puff at 11/23/21 0755    Lab Results:  Results for orders placed or performed during the hospital encounter of 11/21/21 (from the past 48 hour(s))  I-Stat Beta hCG blood, ED ([facility], [facility], [facility] only)     Status: None   Collection Time: 11/22/21 11:19 AM  Result Value Ref Range   I-stat hCG, quantitative <5.0 <5 mIU/mL   Comment 3            Comment:   GEST. AGE      CONC.  (mIU/mL)   <=1 WEEK        5 - 50     2 WEEKS       50 - 500     3 WEEKS       100 - 10,000     4 WEEKS     1,000 - 30,000        FEMALE AND NON-PREGNANT FEMALE:     LESS THAN 5 mIU/mL   TSH     Status: None    Collection Time: 11/22/21 11:27 AM  Result Value Ref Range   TSH 0.668 0.350 - 4.500 uIU/mL    Comment: Performed by a 3rd Generation assay with a functional sensitivity of <=0.01 uIU/mL. Performed at [facility], [place] [address] [place], [place] [address]   Comprehensive metabolic panel     Status: Abnormal   Collection Time: 11/22/21 11:27 AM  Result Value Ref Range   Sodium 143 135 - 145 mmol/L   Potassium 4.5 3.5 - 5.1 mmol/L   Chloride 107 98 - 111 mmol/L   CO2 23 22 - 32 mmol/L   Glucose, Bld 141 (H) 70 - 99 mg/dL    Comment: Glucose reference range applies only to samples taken after fasting for at least 8 hours.   BUN 13 6 - 20 mg/dL   Creatinine, Ser 0.89 0.44 - 1.00 mg/dL   Calcium 9.4 8.9 - 10.3 mg/dL   Total Protein 6.6 6.5 - 8.1 g/dL   Albumin 3.3 (L) 3.5 - 5.0 g/dL   AST 17 15 - 41 U/L   ALT 11 0 - 44 U/L   Alkaline Phosphatase 70 38 - 126 U/L   Total Bilirubin 0.4 0.3 - 1.2 mg/dL   GFR, Estimated >60 >60 mL/min    Comment: (NOTE) Calculated using the CKD-EPI Creatinine Equation (2021)  Anion gap 13 5 - 15    Comment: Performed at [facility] [address], [place], [place] [address]  CBC with Differential     Status: None   Collection Time: 11/22/21 11:27 AM  Result Value Ref Range   WBC 8.1 4.0 - 10.5 K/uL   RBC 4.65 3.87 - 5.11 MIL/uL   Hemoglobin 13.8 12.0 - 15.0 g/dL   HCT 43.7 36.0 - 46.0 %   MCV 94.0 80.0 - 100.0 fL   MCH 29.7 26.0 - 34.0 pg   MCHC 31.6 30.0 - 36.0 g/dL   RDW 14.2 11.5 - 15.5 %   Platelets 218 150 - 400 K/uL   nRBC 0.0 0.0 - 0.2 %   Neutrophils Relative % 62 %   Neutro Abs 5.0 1.7 - 7.7 K/uL   Lymphocytes Relative 28 %   Lymphs Abs 2.3 0.7 - 4.0 K/uL   Monocytes Relative 7 %   Monocytes Absolute 0.5 0.1 - 1.0 K/uL   Eosinophils Relative 2 %   Eosinophils Absolute 0.2 0.0 - 0.5 K/uL   Basophils Relative 1 %   Basophils Absolute 0.0 0.0 - 0.1 K/uL   Immature Granulocytes 0 %   Abs Immature Granulocytes 0.03  0.00 - 0.07 K/uL    Comment: Performed at [facility], [address], [place], [place] [address]  Troponin I (High Sensitivity)     Status: None   Collection Time: 11/22/21 11:27 AM  Result Value Ref Range   Troponin I (High Sensitivity) 15 <18 ng/L    Comment: (NOTE) Elevated high sensitivity troponin I (hsTnI) values and significant  changes across serial measurements may suggest ACS but many other  chronic and acute conditions are known to elevate hsTnI results.  Refer to the "Links" section for chest pain algorithms and additional  guidance. Performed at [facility], [place] [address], [place], [place] [address]   Lipid panel     Status: Abnormal   Collection Time: 11/22/21 11:28 AM  Result Value Ref Range   Cholesterol 161 0 - 200 mg/dL   Triglycerides 280 (H) <150 mg/dL   HDL 40 (L) >40 mg/dL   Total CHOL/HDL Ratio 4.0 RATIO   VLDL 56 (H) 0 - 40 mg/dL   LDL Cholesterol 65 0 - 99 mg/dL    Comment:        Total Cholesterol/HDL:CHD Risk Coronary Heart Disease Risk Table                     Men   Women  1/2 Average Risk   3.4   3.3  Average Risk       5.0   4.4  2 X Average Risk   9.6   7.1  3 X Average Risk  23.4   11.0        Use the calculated Patient Ratio above and the CHD Risk Table to determine the patient's CHD Risk.        ATP III CLASSIFICATION (LDL):  <100     mg/dL   Optimal  100-129  mg/dL   Near or Above                    Optimal  130-159  mg/dL   Borderline  160-189  mg/dL   High  >190     mg/dL   Very High Performed at [facility] [address], [place], [place] [address]  Hemoglobin A1c     Status: Abnormal   Collection Time: 11/22/21 11:29 AM  Result Value Ref Range   Hgb A1c MFr Bld 6.5 (H) 4.8 - 5.6 %    Comment: (NOTE) Pre diabetes:          5.7%-6.4%  Diabetes:              >6.4%  Glycemic control for   <7.0% adults with diabetes    Mean Plasma Glucose 139.85 mg/dL    Comment: Performed at [facility] [address], [place], [place] [address]  Urinalysis, Complete w Microscopic Urine, Clean Catch     Status: Abnormal   Collection Time: 11/22/21 11:44 AM  Result Value Ref Range   Color, Urine YELLOW YELLOW   APPearance CLEAR CLEAR   Specific Gravity, Urine 1.020 1.005 - 1.030   pH 6.0 5.0 - 8.0   Glucose, UA NEGATIVE NEGATIVE mg/dL   Hgb urine dipstick TRACE (A) NEGATIVE   Bilirubin Urine NEGATIVE NEGATIVE   Ketones, ur NEGATIVE NEGATIVE mg/dL   Protein, ur NEGATIVE NEGATIVE mg/dL   Nitrite NEGATIVE NEGATIVE   Leukocytes,Ua MODERATE (A) NEGATIVE   Squamous Epithelial / LPF 6-10 0 - 5   WBC, UA 6-10 0 - 5 WBC/hpf   RBC / HPF 6-10 0 - 5 RBC/hpf   Bacteria, UA RARE (A) NONE SEEN    Comment: Performed at [facility], [address], [place], [place] [address]  Rapid urine drug screen (hospital performed)     Status: Abnormal   Collection Time: 11/22/21 11:44 AM  Result Value Ref Range   Opiates NONE DETECTED NONE DETECTED   Cocaine POSITIVE (A) NONE DETECTED   Benzodiazepines NONE DETECTED NONE DETECTED   Amphetamines NONE DETECTED NONE DETECTED   Tetrahydrocannabinol NONE DETECTED NONE DETECTED   Barbiturates NONE DETECTED NONE DETECTED    Comment: (NOTE) DRUG SCREEN FOR MEDICAL PURPOSES ONLY.  IF CONFIRMATION IS NEEDED FOR ANY PURPOSE, NOTIFY LAB WITHIN 5 DAYS.  LOWEST DETECTABLE LIMITS FOR URINE DRUG SCREEN Drug Class                     Cutoff (ng/mL) Amphetamine and metabolites    1000 Barbiturate and metabolites    200 Benzodiazepine                 [id number] Tricyclics and metabolites     300 Opiates and metabolites        300 Cocaine and metabolites        300 THC                            50 Performed at [facility], [place] [address], [place], [place] [address]   Troponin I (High Sensitivity)     Status: None   Collection Time: 11/22/21  1:00 PM  Result Value Ref Range   Troponin I (High Sensitivity) 13 <18 ng/L    Comment: (NOTE) Elevated high  sensitivity troponin I (hsTnI) values and significant  changes across serial measurements may suggest ACS but many other  chronic and acute conditions are known to elevate hsTnI results.  Refer to the "Links" section for chest pain algorithms and additional  guidance. Performed at [facility], [place] [address] [place], [place] [address]   Urinalysis, Complete w Microscopic     Status: Abnormal   Collection Time: 11/22/21  6:06 PM  Result Value Ref Range   Color, Urine YELLOW YELLOW   APPearance CLEAR CLEAR   Specific Gravity, Urine 1.015 1.005 - 1.030   pH 6.0 5.0 - 8.0   Glucose, UA NEGATIVE NEGATIVE mg/dL   Hgb urine dipstick NEGATIVE NEGATIVE   Bilirubin Urine NEGATIVE NEGATIVE   Ketones, ur NEGATIVE NEGATIVE mg/dL   Protein, ur NEGATIVE NEGATIVE mg/dL   Nitrite NEGATIVE NEGATIVE   Leukocytes,Ua LARGE (A) NEGATIVE   RBC / HPF 6-10 0 - 5 RBC/hpf   WBC, UA 11-20 0 - 5 WBC/hpf   Bacteria, UA RARE (A) NONE SEEN   Squamous Epithelial / LPF 0-5 0 - 5    Comment: Performed at [facility], [place] [address] [place], [place] [address]  Rapid urine drug screen (hospital performed)     Status: Abnormal   Collection Time: 11/22/21  6:06 PM  Result Value Ref Range   Opiates NONE DETECTED NONE DETECTED   Cocaine POSITIVE (A) NONE DETECTED   Benzodiazepines NONE DETECTED NONE DETECTED   Amphetamines NONE DETECTED NONE DETECTED   Tetrahydrocannabinol NONE DETECTED NONE DETECTED   Barbiturates NONE DETECTED NONE DETECTED    Comment: (NOTE) DRUG SCREEN FOR MEDICAL PURPOSES ONLY.  IF CONFIRMATION IS NEEDED FOR ANY PURPOSE, NOTIFY LAB WITHIN 5 DAYS.  LOWEST DETECTABLE LIMITS FOR URINE DRUG SCREEN Drug Class                     Cutoff (ng/mL) Amphetamine and metabolites    1000 Barbiturate and metabolites    200 Benzodiazepine                 [id number] Tricyclics and metabolites     300 Opiates and metabolites        300 Cocaine and metabolites        300 THC                             50 Performed at [facility], [place] [address], [place], [place] [address]   TSH     Status: None   Collection Time: 11/22/21  6:42 PM  Result Value Ref Range   TSH 0.567 0.350 - 4.500 uIU/mL    Comment: Performed by a 3rd Generation assay with a functional sensitivity of <=0.01 uIU/mL. Performed at [facility], [place] [address] [place], [place] [address]   Lipid panel     Status: Abnormal   Collection Time: 11/22/21  6:42 PM  Result Value Ref Range   Cholesterol 169 0 - 200 mg/dL   Triglycerides 264 (H) <150 mg/dL   HDL 42 >40 mg/dL   Total CHOL/HDL Ratio 4.0 RATIO   VLDL 53 (H) 0 - 40 mg/dL   LDL Cholesterol 74 0 - 99 mg/dL    Comment:        Total Cholesterol/HDL:CHD Risk Coronary Heart Disease Risk Table                     Men   Women  1/2 Average Risk   3.4   3.3  Average Risk       5.0   4.4  2 X Average Risk   9.6   7.1  3 X Average Risk  23.4   11.0        Use the calculated Patient Ratio above and the CHD Risk Table to determine the patient's CHD Risk.  ATP III CLASSIFICATION (LDL):  <100     mg/dL   Optimal  592-924  mg/dL   Near or Above                    Optimal  130-159  mg/dL   Borderline  462-863  mg/dL   High  >817     mg/dL   Very High Performed at [facility], [address] [place], [place] [address]   Hemoglobin A1c     Status: Abnormal   Collection Time: 11/22/21  6:42 PM  Result Value Ref Range   Hgb A1c MFr Bld 6.5 (H) 4.8 - 5.6 %    Comment: (NOTE) Pre diabetes:          5.7%-6.4%  Diabetes:              >6.4%  Glycemic control for   <7.0% adults with diabetes    Mean Plasma Glucose 139.85 mg/dL    Comment: Performed at [facility], [address], [place], [place] [address]    Blood Alcohol level:  Lab Results  Component Value Date   [facility] <10 11/20/2021   ETH <10 11/05/2020    Metabolic Disorder Labs: Lab Results  Component Value Date    HGBA1C 6.5 (H) 11/22/2021   MPG 139.85 11/22/2021   MPG 139.85 11/22/2021   No results found for: PROLACTIN Lab Results  Component Value Date   CHOL 169 11/22/2021   TRIG 264 (H) 11/22/2021   HDL 42 11/22/2021   CHOLHDL 4.0 11/22/2021   VLDL 53 (H) 11/22/2021   LDLCALC 74 11/22/2021   LDLCALC 65 11/22/2021    Physical Findings: AIMS: Facial and Oral Movements Muscles of Facial Expression: None, normal Lips and Perioral Area: None, normal Jaw: None, normal Tongue: None, normal,Extremity Movements Upper (arms, wrists, hands, fingers): None, normal Lower (legs, knees, ankles, toes): None, normal, Trunk Movements Neck, shoulders, hips: None, normal, Overall Severity Severity of abnormal movements (highest score from questions above): None, normal Incapacitation due to abnormal movements: None, normal Patient's awareness of abnormal movements (rate only patient's report): No Awareness, Dental Status Current problems with teeth and/or dentures?: No Does patient usually wear dentures?: No     Musculoskeletal: Strength & Muscle Tone: decreased Gait & Station:  remains in bed, does not move much Patient leans: N/A  Psychiatric Specialty Exam:  Presentation  General Appearance: -- (wearing hospital gown and shower cap as if in a medical hospital, strong maldodor)  Eye Contact:Minimal  Speech:clear and coherent, normal rate  Speech Volume:Decreased  Mood and Affect  Mood:Dysphoric; Depressed  Affect:Depressed   Thought Process  Thought Processes:Linear, goal directed  Descriptions of Associations:Intact  Orientation:Full (Time, Place and Person)  Thought Content: Denies AVH, ideas of reference, first rank symptoms, or paranoia  History of Schizophrenia/Schizoaffective disorder:No  Duration of Psychotic Symptoms:Less than six months  Hallucinations:Hallucinations: None  Ideas of Reference:None  Suicidal Thoughts:Suicidal Thoughts: Yes, Passive SI Passive  Intent and/or Plan: Without Plan  Homicidal Thoughts:Homicidal Thoughts: No   Sensorium  Memory:Immediate Fair; Recent Fair; Remote Fair  Judgment:-- (Improving)  Insight:Shallow   Executive Functions  Concentration:Fair  Attention Span:Fair  Recall:Fair  Fund of Knowledge:Fair  Language:Fair   Psychomotor Activity  Psychomotor Activity:Psychomotor Activity: Psychomotor Retardation   Assets  Assets:Resilience   Sleep  7.25 hours per staff  Physical Exam Vitals reviewed.  Constitutional:      Appearance: She is well-developed. She is obese.  HENT:  Head: Normocephalic and atraumatic.  Pulmonary:     Effort: Pulmonary effort is normal.  Skin:    General: Skin is dry.  Neurological:     Mental Status: She is alert and oriented to person, place, and time.   Review of Systems  Respiratory:  Negative for shortness of breath.   Cardiovascular:  Negative for chest pain.  Gastrointestinal:  Positive for nausea. Negative for constipation, diarrhea and vomiting.  Neurological:  Positive for headaches.  Psychiatric/Behavioral:  Positive for suicidal ideas. Negative for hallucinations. The patient has insomnia.   Blood pressure (!) 134/91, pulse (!) 110, temperature 97.7 F (36.5 C), temperature source Oral, resp. rate 16, height 5\' 1"  (1.549 m), weight 106.6 kg, SpO2 100 %. Body mass index is 44.4 kg/m.   Treatment Plan Summary: Daily contact with patient to assess and evaluate symptoms and progress in treatment and Medication management  Cathy Hall is a 51 yo patient w. PPH of MDD and PTSD who appears to present with a current episode of MDD in the context of cocaine and THC use and recent anniversaries of losses. Patient continues to be very depressed and again confirmed previous success with her medication but months without it due to affordability. Patient's presentation is overall concerning as patient stays in the hospital bed, does not move  during assessments, patient does not really lift her head to look at providers when they come. Patient does not flip sides and providers have to accommodate patient. However, it was promising that patient is interested in showering if she has assistance. It appeared that some of patient's presentation is 2/2 to her debilitation since her last few strokes 4 and 6 months ago. Patient depression may be worsened by her decreased ability to take care of herself.    MDD, recurrent, severe w/ psychotic features ( R/O SIMD vs SIPD) Hx of PTSD - Continue Prozac 10mg  (previous home dose was 20mg ), intend to increase as tolerated - Continue home Vistaril 25mg  TID PRN anxiety - Continue home gabapentin 300mg  TID - Continue home medication Zyprexa 5mg  QHS - Shower assistance and prompts to attend to ADLs  Elevated A1c With patient's elevated A1c, current HLD, and obesity in conjunction with Zyprexa use we will start patient on Metformin. - Start Metformin 500mg  daily- Daily CBG's (she consents to start of medication)   Stimulant use disorder, severe, cocaine Cannabis use disorder - counseled on need to abstain from use - will discuss outpatient SA treatment options with SW - Monitoring for signs of withdrawal   CVA history:  - continue home Plavix 75mg , hx of CVA - Continue home ASA, 81 mg, hx of CVA   HLD -- Continue home Lipitor 40mg    Tobacco use disorder -  Decrease to 14mg  patch due to reported Tobacco use of 0.5ppd   GERD - Protonix 40mg  daily   COPD - Continue home Dulera 2 puffs bid - Continue home Incruse Ellipta 1 puff daily - Continue home Albuterol PRN - Continue home Singulair 10mg  daily    HTN - Norvasc 10mg  daily - Clonidine 0.1mg  q8 hours PRN SBP>160 or DBP>100; hold for HR <60   CP - resolved - Sent to ED with negative Troponin and CXR shows atelectasis; EKG unchanged compared to previous tracing - per ED no further w/u indicated at this time   Abnormal UA - Urine  culture ordered   Mobility issues - PT consult ordered; continue wheelchair and walker  PGY-2 Freida Busman, MD 11/23/2021, 4:03  PM

## 2021-11-23 NOTE — Progress Notes (Signed)
D:  Surina was isolative to her room this evening.  She did not attend evening wrap up group.  She denied SI/HI or AVH.  She reported her day as 7/10 (10 the best).  She rated her depression as 6-7/10 and anxiety is 6-7/10 (10 the worst).  She reported that she didn't eat all day and is starving.  Staff reported that she did eat today.  She was provided with extra snacks.  She did ask for sandwich tray but explained to her that that we give those to new admissions.  After that conversation, she asked three other staff members for a sandwich tray.  Explained to her that she needs to go to the cafeteria for meals.  She denied SI/HI or AVH.  She did come out of her room for medications.  She was able to transition to W/C with no assistance from staff.  PRN for anxiety requested with good relief. A:  1:1 with RN for support and encouragement.  Medications given as ordered.  Q 15 minute checks maintained for safety.  Encouraged participation in group and unit activities.   R:  She is currently resting with her eyes closed and appears to be asleep.  She remains safe on the unit.  We will continue to monitor the progress towards her goals.

## 2021-11-23 NOTE — Group Note (Signed)
Date:  11/23/2021 Time:  2:02 PM  Group Topic/Focus:  Orientation:   The focus of this group is to educate the patient on the purpose and policies of crisis stabilization and provide a format to answer questions about their admission.  The group details unit policies and expectations of patients while admitted.    Participation Level:  Did Not Attend  Participation Quality:      Affect:    Cognitive:    Insight:   Engagement in Group:    Modes of Intervention:    Additional Comments:    Reymundo Poll 11/23/2021, 2:02 PM
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
## 2021-11-23 NOTE — Progress Notes (Signed)
D- Patient alert and oriented x4. Patient in stable mood.  Verbalizes passive SI but verbally contracts for safety. Denies HI and AVH. Pt interacting more with peers and staff and attending group.    A- Scheduled medications administered to patient, per MD orders.Routine safety checks conducted every 15 minutes.  Patient informed to notify staff with problems or concerns.   R- Patient compliant with medications and verbalized understanding treatment plan. Patient receptive, calm, and cooperative.    11/22/21 2125  Psych Admission Type (Psych Patients Only)  Admission Status Voluntary  Psychosocial Assessment  Patient Complaints Sadness;Irritability  Eye Contact Fair  Facial Expression Sad  Affect Sad;Irritable  Speech Logical/coherent  Interaction Assertive  Motor Activity Unsteady;Slow  Appearance/Hygiene Disheveled;Poor hygiene  Behavior Characteristics Appropriate to situation;Irritable;Cooperative  Mood Sad  Thought Administrator, sports thinking  Content Blaming others;Blaming self  Delusions None reported or observed  Perception WDL  Hallucination None reported or observed  Judgment Poor  Confusion None  Danger to Self  Current suicidal ideation? Passive  Self-Injurious Behavior No self-injurious ideation or behavior indicators observed or expressed   Agreement Not to Harm Self Yes  Description of Agreement Verbal Contract  Danger to Others  Danger to Others None reported or observed

## 2021-11-23 NOTE — Progress Notes (Signed)
Pt denies SI/HI/AVH and verbally agrees to approach staff if these become apparent or before harming themselves/others. Rates depression 8/10. Rates anxiety 10/10. Rates pain 8/10 on right side.  PRNs given. Pt was breathing heavy when pt wheeled herself up to the med window. Scheduled inhalers given first and was better by end of assessment. Pt was seen by PT and PT recommends a walker and a knee immobilizer. Pt needs an order for both. Charge and MD notified of knee immobilizer currently. Pt has been complaining of knee pain throughout the day. PRNs given. Pt did take a shower. Scheduled medications administered to pt, per MD orders. RN provided support and encouragement to pt. Q15 min safety checks implemented and continued. Pt safe on the unit. RN will continue to monitor and intervene as needed.   11/23/21 0756  Psych Admission Type (Psych Patients Only)  Admission Status Voluntary  Psychosocial Assessment  Patient Complaints Depression;Anxiety  Eye Contact Fair  Facial Expression Sad;Pained  Affect Anxious;Depressed  Speech Logical/coherent  Interaction Assertive  Motor Activity Slow;Unsteady  Appearance/Hygiene Poor hygiene;Disheveled  Behavior Characteristics Cooperative;Anxious  Mood Depressed;Anxious  Thought Process  Coherency Concrete thinking  Content Blaming others;Blaming self  Delusions None reported or observed  Perception WDL  Hallucination None reported or observed  Judgment Poor  Confusion None  Danger to Self  Current suicidal ideation? Denies  Self-Injurious Behavior No self-injurious ideation or behavior indicators observed or expressed   Agreement Not to Harm Self Yes  Description of Agreement Verbal Contract  Danger to Others  Danger to Others None reported or observed

## 2021-11-24 LAB — GLUCOSE, CAPILLARY
Glucose-Capillary: 146 mg/dL — ABNORMAL HIGH (ref 70–99)
Glucose-Capillary: 180 mg/dL — ABNORMAL HIGH (ref 70–99)

## 2021-11-24 LAB — URINE CULTURE

## 2021-11-24 MED ORDER — METHOCARBAMOL 500 MG PO TABS
500.0000 mg | ORAL_TABLET | Freq: Three times a day (TID) | ORAL | Status: DC
  Administered 2021-11-24: 500 mg via ORAL
  Filled 2021-11-24 (×3): qty 1

## 2021-11-24 MED ORDER — GABAPENTIN 400 MG PO CAPS
400.0000 mg | ORAL_CAPSULE | Freq: Three times a day (TID) | ORAL | Status: DC
  Administered 2021-11-24 – 2021-11-29 (×15): 400 mg via ORAL
  Filled 2021-11-24 (×12): qty 1
  Filled 2021-11-24: qty 21
  Filled 2021-11-24 (×4): qty 1
  Filled 2021-11-24 (×2): qty 21
  Filled 2021-11-24 (×2): qty 1

## 2021-11-24 MED ORDER — FLUOXETINE HCL 20 MG PO CAPS
20.0000 mg | ORAL_CAPSULE | Freq: Every day | ORAL | Status: DC
  Administered 2021-11-25 – 2021-11-29 (×5): 20 mg via ORAL
  Filled 2021-11-24 (×3): qty 1
  Filled 2021-11-24: qty 7
  Filled 2021-11-24 (×3): qty 1

## 2021-11-24 MED ORDER — DICLOFENAC SODIUM 1 % EX GEL
2.0000 g | Freq: Four times a day (QID) | CUTANEOUS | Status: DC
  Administered 2021-11-24 – 2021-11-29 (×13): 2 g via TOPICAL
  Filled 2021-11-24 (×2): qty 100

## 2021-11-24 MED ORDER — ONDANSETRON 4 MG PO TBDP: 4.0000 mg | ORAL_TABLET | Freq: Three times a day (TID) | ORAL | Status: DC | PRN

## 2021-11-24 MED ORDER — MELATONIN 3 MG PO TABS
3.0000 mg | ORAL_TABLET | Freq: Every day | ORAL | Status: DC
  Administered 2021-11-24: 3 mg via ORAL
  Filled 2021-11-24 (×4): qty 1

## 2021-11-24 MED ORDER — FLUTICASONE PROPIONATE 50 MCG/ACT NA SUSP
1.0000 | Freq: Every day | NASAL | Status: DC
  Administered 2021-11-24 – 2021-11-29 (×6): 1 via NASAL
  Filled 2021-11-24 (×2): qty 16

## 2021-11-24 MED ORDER — PNEUMOCOCCAL VAC POLYVALENT 25 MCG/0.5ML IJ INJ
0.5000 mL | INJECTION | INTRAMUSCULAR | Status: DC
  Filled 2021-11-24: qty 0.5

## 2021-11-24 MED ORDER — METHOCARBAMOL 500 MG PO TABS
500.0000 mg | ORAL_TABLET | Freq: Three times a day (TID) | ORAL | Status: DC | PRN
  Administered 2021-11-25: 500 mg via ORAL

## 2021-11-24 NOTE — [facility] Group Notes (Signed)
Patient did not attend morning orientation/goal group. ?

## 2021-11-24 NOTE — Progress Notes (Signed)
Pt went to the cafeteria and was noted eating breakfast and requested seconds.

## 2021-11-24 NOTE — Progress Notes (Addendum)
[facility] MD Progress Note  11/24/2021 3:38 PM Cathy Hall  MRN:  [id number] Subjective:  Cathy Hall  a 51 yo patient w. PPH of MDD and PTSD who presented to [facility] endorsing SI with recent use of cocaine and BP of 200/100.During patient intake, patient's EKG resulted abnormal T waves concerning for ischemia and patient endorsed chest pain, patient transferred to [facility] via EMS for Medical Eval and clearance. Patient was evaluated for MI and was medically cleared and transported back to [facility].    Case was discussed in the multidisciplinary team. MAR was reviewed and patient was compliant with medications.  She did not require any PRN's for agitation.     Psychiatric Team made the following recommendations yesterday:  - Continue Prozac 10mg  (previous home dose was 20mg ), intend to increase as tolerated - Continue home Vistaril 25mg  TID PRN anxiety - Continue home gabapentin 300mg  TID - Continue home medication Zyprexa 5mg  QHS - Shower assistance and prompts to attend to ADLs  Patient has been very needy today. Patient has bathed and put on clothing.Patient endorses that she is also eating better.  Patient endorses that she is still depressed but doing better than yesterday. Patient reports that since yesterday AM she has been up and out of her room a little bit more and endorses that this makes her feel better. Patient reports that she is also happy to have showered and put on real clothes. Patient reports that she saw PT yesterday and is happy to have a brace and believes she would get around a bit better if she has leg rest to put on her wheelchair. Patient reports that she is having a lot of pain in her leg and does not feel that the Tylenol is enough for her. Patient reports that she is still struggling with her thoughts and feeling that she is "not where I  am supposed to be in life, right now." Patient reports that she also had a difficult time sleeping last night. Patient report  that she frequently heard the night time checks by staff, but was not aware that this is what they were. Patient reports that she started to become very afraid and worried that someone was in her room. Patient reports that she was eventually able to go to sleep, but was more tired this AM due to her anxiety last night and poor sleep. Patient reports that she is not having SI, HI or AVH today. She denies ideas of reference or first rank symptoms. Patient reports that she did have AH last night. Patient reports that she still feels that her anxiety is bad this AM, but that she will try Atarax again because it helped her calm down and rest last night.    Principal Problem: MDD (major depressive disorder), recurrent, severe, with psychosis ([place]) Diagnosis: Principal Problem:   MDD (major depressive disorder), recurrent, severe, with psychosis ([place]) Active Problems:   Cocaine abuse ([place])  Total Time spent with patient: I personally spent 30 minutes on the unit in direct patient care. The direct patient care time included face-to-face time with the patient, reviewing the patient's chart, communicating with other professionals, and coordinating care. Greater than 50% of this time was spent in counseling or coordinating care with the patient regarding goals of hospitalization, psycho-education, and discharge planning needs.  Past Psychiatric History: See H&P  Past Medical History:  Past Medical History:  Diagnosis Date   Anxiety    Asthma    COPD (chronic obstructive pulmonary  disease) ([place])    Depression    Hypertension    Major depression    PTSD (post-traumatic stress disorder)     Past Surgical History:  Procedure Laterality Date   ECTOPIC PREGNANCY SURGERY     Family History:  Family History  Problem Relation Age of Onset   Cerebral aneurysm Mother    Diabetes Sister    Other Neg Hx    Family Psychiatric  History: See H&P Social History:  Social History   Substance and Sexual  Activity  Alcohol Use Yes   Comment: occasional     Social History   Substance and Sexual Activity  Drug Use Yes   Types: Cocaine, Marijuana   Comment: last cocaine use 3 days ago    Social History   Socioeconomic History   Marital status: Significant Other    Spouse name: Not on file   Number of children: Not on file   Years of education: Not on file   Highest education level: Not on file  Occupational History   Not on file  Tobacco Use   Smoking status: Every Day    Packs/day: 1.00    Types: Cigarettes   Smokeless tobacco: Never  Vaping Use   Vaping Use: Never used  Substance and Sexual Activity   Alcohol use: Yes    Comment: occasional   Drug use: Yes    Types: Cocaine, Marijuana    Comment: last cocaine use 3 days ago   Sexual activity: Not Currently  Other Topics Concern   Not on file  Social History Narrative   ** Merged History Encounter **       Social Determinants of Health   Financial Resource Strain: Not on file  Food Insecurity: Not on file  Transportation Needs: Not on file  Physical Activity: Not on file  Stress: Not on file  Social Connections: Not on file    Sleep: Poor per patient report; 8.75 hours per staff  Appetite:  Good  Current Medications: Current Facility-Administered Medications  Medication Dose Route Frequency Provider Last Rate Last Admin   acetaminophen (TYLENOL) tablet 650 mg  650 mg Oral Q6H PRN Rankin, Shuvon B, NP   650 mg at 11/24/21 1529   albuterol (VENTOLIN HFA) 108 (90 Base) MCG/ACT inhaler 1-2 puff  1-2 puff Inhalation Q4H PRN Rankin, Shuvon B, NP   1 puff at 11/21/21 1717   alum & mag hydroxide-simeth (MAALOX/MYLANTA) 200-200-20 MG/5ML suspension 30 mL  30 mL Oral Q4H PRN Rankin, Shuvon B, NP       amLODipine (NORVASC) tablet 10 mg  10 mg Oral Daily Rankin, Shuvon B, NP   10 mg at 11/24/21 0802   aspirin EC tablet 325 mg  325 mg Oral Daily Rankin, Shuvon B, NP   325 mg at 11/24/21 0801   atorvastatin (LIPITOR)  tablet 40 mg  40 mg Oral Daily Rankin, Shuvon B, NP   40 mg at 11/24/21 0801   cloNIDine (CATAPRES) tablet 0.1 mg  0.1 mg Oral Q8H PRN Nelda Marseille, Asahel Risden E, MD   0.1 mg at 11/24/21 1226   clopidogrel (PLAVIX) tablet 75 mg  75 mg Oral Daily Rankin, Shuvon B, NP   75 mg at 11/24/21 0801   diclofenac Sodium (VOLTAREN) 1 % topical gel 2 g  2 g Topical QID Damita Dunnings B, MD       FLUoxetine (PROZAC) capsule 10 mg  10 mg Oral Daily Damita Dunnings B, MD   10 mg at 11/24/21 [phone]  fluticasone (FLONASE) 50 MCG/ACT nasal spray 1 spray  1 spray Each Nare Daily Damita Dunnings B, MD   1 spray at 11/24/21 1228   gabapentin (NEURONTIN) capsule 300 mg  300 mg Oral TID Rankin, Shuvon B, NP   300 mg at 11/24/21 1137   hydrOXYzine (ATARAX) tablet 25 mg  25 mg Oral TID PRN Harlow Asa, MD   25 mg at 11/24/21 1226   influenza vac split quadrivalent PF (FLUARIX) injection 0.5 mL  0.5 mL Intramuscular Tomorrow-1000 Nelda Marseille, Jezelle Gullick E, MD       magnesium hydroxide (MILK OF MAGNESIA) suspension 30 mL  30 mL Oral Daily PRN Rankin, Shuvon B, NP       metFORMIN (GLUCOPHAGE) tablet 500 mg  500 mg Oral Q breakfast Damita Dunnings B, MD   500 mg at 11/24/21 0801   methocarbamol (ROBAXIN) tablet 500 mg  500 mg Oral TID Freida Busman, MD       mometasone-formoterol (DULERA) 100-5 MCG/ACT inhaler 2 puff  2 puff Inhalation BID Rankin, Shuvon B, NP   2 puff at 11/24/21 0800   montelukast (SINGULAIR) tablet 10 mg  10 mg Oral QHS Nelda Marseille, Ereka Brau E, MD   10 mg at 11/23/21 2204   multivitamin with minerals tablet 1 tablet  1 tablet Oral Daily Rankin, Shuvon B, NP   1 tablet at 11/24/21 0801   nicotine (NICODERM CQ - dosed in mg/24 hours) patch 14 mg  14 mg Transdermal Daily Damita Dunnings B, MD   14 mg at 11/24/21 0806   OLANZapine (ZYPREXA) tablet 5 mg  5 mg Oral QHS Damita Dunnings B, MD   5 mg at 11/23/21 2204   pantoprazole (PROTONIX) EC tablet 40 mg  40 mg Oral Daily Rankin, Shuvon B, NP   40 mg at 11/24/21 0801   umeclidinium bromide  (INCRUSE ELLIPTA) 62.5 MCG/ACT 1 puff  1 puff Inhalation Daily Harlow Asa, MD   1 puff at 11/24/21 0800    Lab Results:  Results for orders placed or performed during the hospital encounter of 11/21/21 (from the past 48 hour(s))  Urine Culture     Status: Abnormal   Collection Time: 11/22/21  6:06 PM   Specimen: Urine, Clean Catch  Result Value Ref Range   Specimen Description      URINE, CLEAN CATCH Performed at [facility], [place] [address], [place], [place] [address]    Special Requests      NONE Performed at [facility], [place] [address], [place], [place] [address]    Culture MULTIPLE SPECIES PRESENT, SUGGEST RECOLLECTION (A)    Report Status 11/24/2021 FINAL   Urinalysis, Complete w Microscopic     Status: Abnormal   Collection Time: 11/22/21  6:06 PM  Result Value Ref Range   Color, Urine YELLOW YELLOW   APPearance CLEAR CLEAR   Specific Gravity, Urine 1.015 1.005 - 1.030   pH 6.0 5.0 - 8.0   Glucose, UA NEGATIVE NEGATIVE mg/dL   Hgb urine dipstick NEGATIVE NEGATIVE   Bilirubin Urine NEGATIVE NEGATIVE   Ketones, ur NEGATIVE NEGATIVE mg/dL   Protein, ur NEGATIVE NEGATIVE mg/dL   Nitrite NEGATIVE NEGATIVE   Leukocytes,Ua LARGE (A) NEGATIVE   RBC / HPF 6-10 0 - 5 RBC/hpf   WBC, UA 11-20 0 - 5 WBC/hpf   Bacteria, UA RARE (A) NONE SEEN   Squamous Epithelial / LPF 0-5 0 - 5    Comment: Performed at [facility],  [place] [address] [place], [place] [address]  Rapid urine drug screen (hospital performed)     Status: Abnormal   Collection Time: 11/22/21  6:06 PM  Result Value Ref Range   Opiates NONE DETECTED NONE DETECTED   Cocaine POSITIVE (A) NONE DETECTED   Benzodiazepines NONE DETECTED NONE DETECTED   Amphetamines NONE DETECTED NONE DETECTED   Tetrahydrocannabinol NONE DETECTED NONE DETECTED   Barbiturates NONE DETECTED NONE DETECTED    Comment: (NOTE) DRUG SCREEN FOR MEDICAL PURPOSES ONLY.  IF CONFIRMATION  IS NEEDED FOR ANY PURPOSE, NOTIFY LAB WITHIN 5 DAYS.  LOWEST DETECTABLE LIMITS FOR URINE DRUG SCREEN Drug Class                     Cutoff (ng/mL) Amphetamine and metabolites    1000 Barbiturate and metabolites    200 Benzodiazepine                 [id number] Tricyclics and metabolites     300 Opiates and metabolites        300 Cocaine and metabolites        300 THC                            50 Performed at [facility], [place] [address], [place], [place] [address]   TSH     Status: None   Collection Time: 11/22/21  6:42 PM  Result Value Ref Range   TSH 0.567 0.350 - 4.500 uIU/mL    Comment: Performed by a 3rd Generation assay with a functional sensitivity of <=0.01 uIU/mL. Performed at [facility], [place] [address] [place], [place] [address]   Lipid panel     Status: Abnormal   Collection Time: 11/22/21  6:42 PM  Result Value Ref Range   Cholesterol 169 0 - 200 mg/dL   Triglycerides 264 (H) <150 mg/dL   HDL 42 >40 mg/dL   Total CHOL/HDL Ratio 4.0 RATIO   VLDL 53 (H) 0 - 40 mg/dL   LDL Cholesterol 74 0 - 99 mg/dL    Comment:        Total Cholesterol/HDL:CHD Risk Coronary Heart Disease Risk Table                     Men   Women  1/2 Average Risk   3.4   3.3  Average Risk       5.0   4.4  2 X Average Risk   9.6   7.1  3 X Average Risk  23.4   11.0        Use the calculated Patient Ratio above and the CHD Risk Table to determine the patient's CHD Risk.        ATP III CLASSIFICATION (LDL):  <100     mg/dL   Optimal  100-129  mg/dL   Near or Above                    Optimal  130-159  mg/dL   Borderline  160-189  mg/dL   High  >190     mg/dL   Very High Performed at [place] [address] [place], [place] [address]   Hemoglobin A1c     Status: Abnormal   Collection Time: 11/22/21  6:42 PM  Result Value Ref Range   Hgb A1c MFr Bld 6.5 (H) 4.8 - 5.6 %  Comment: (NOTE) Pre diabetes:           5.7%-6.4%  Diabetes:              >6.4%  Glycemic control for   <7.0% adults with diabetes    Mean Plasma Glucose 139.85 mg/dL    Comment: Performed at [facility], [address] [place], [place] [address]  Glucose, capillary     Status: Abnormal   Collection Time: 11/24/21  6:28 AM  Result Value Ref Range   Glucose-Capillary 146 (H) 70 - 99 mg/dL    Comment: Glucose reference range applies only to samples taken after fasting for at least 8 hours.    Blood Alcohol level:  Lab Results  Component Value Date   ETH <10 11/20/2021   ETH <10 11/05/2020    Metabolic Disorder Labs: Lab Results  Component Value Date   HGBA1C 6.5 (H) 11/22/2021   MPG 139.85 11/22/2021   MPG 139.85 11/22/2021   No results found for: PROLACTIN Lab Results  Component Value Date   CHOL 169 11/22/2021   TRIG 264 (H) 11/22/2021   HDL 42 11/22/2021   CHOLHDL 4.0 11/22/2021   VLDL 53 (H) 11/22/2021   LDLCALC 74 11/22/2021   LDLCALC 65 11/22/2021    Physical Findings: AIMS: Facial and Oral Movements Muscles of Facial Expression: None, normal Lips and Perioral Area: None, normal Jaw: None, normal Tongue: None, normal,Extremity Movements Upper (arms, wrists, hands, fingers): None, normal Lower (legs, knees, ankles, toes): None, normal, Trunk Movements Neck, shoulders, hips: None, normal, Overall Severity Severity of abnormal movements (highest score from questions above): None, normal Incapacitation due to abnormal movements: None, normal Patient's awareness of abnormal movements (rate only patient's report): No Awareness, Dental Status Current problems with teeth and/or dentures?: No Does patient usually wear dentures?: No    Musculoskeletal: Strength & Muscle Tone: untested Gait & Station: using wheelchair for ambulation Patient leans: N/A  Psychiatric Specialty Exam:  Presentation  General Appearance: -- Obese, wearing clothes and bonnet, the front of clothing is covered in  food stains   Eye Contact:Fair   Speech:clear and coherent, normal rate   Speech Volume:Decreased   Mood and Affect  Mood:Dysphoric   Affect:constricted     Thought Process  Thought Processes:Linear, goal directed   Descriptions of Associations:Intact   Orientation:Full (Time, Place and Person)   Thought Content: Denies ideas of reference, first rank symptoms, or paranoia today; reports some paranoia with room checks last night and reports AH last night - denies current AVH; is not grossly responding to internal/external stimuli on exam   Suicidal Thoughts:Passive but contracts for safety; no intent or plan  Homicidal Thoughts:Homicidal Thoughts: No     Sensorium  Memory:Immediate Fair; Recent Fair; Remote Fair   Judgment:-- (Improving)   Insight:Shallow     Executive Functions  Concentration:Fair   Attention Span:Fair   Recall:Fair   Fund of Knowledge:Fair   Language:Fair     Psychomotor Activity  Psychomotor Activity:Using wheelchair - no tremors or restlessness noted     Assets  Assets:Resilience     Sleep  8.75 hours   Physical Exam HENT:     Head: Normocephalic and atraumatic.  Pulmonary:     Effort: Pulmonary effort is normal.  Skin:    General: Skin is dry.  Neurological:     Mental Status: She is alert.   Review of Systems  Cardiovascular:  Negative for chest pain.  Gastrointestinal:  Negative for nausea.  Musculoskeletal:  Positive for joint pain and myalgias.  Neurological:  Positive for headaches.  Psychiatric/Behavioral:  Negative for hallucinations. The patient is nervous/anxious.   Blood pressure (!) 141/82, pulse 82, temperature 97.8 F (36.6 C), temperature source Oral, resp. rate 16, height 5\' 1"  (1.549 m), weight 106.6 kg, SpO2 95 %. Body mass index is 44.4 kg/m.   Treatment Plan Summary: Daily contact with patient to assess and evaluate symptoms and progress in treatment and Medication management  Cathy Samara-  [name] is a 51 yo patient w. PPH of MDD and PTSD who appears to present with a current episode of MDD in the context of cocaine and THC use and recent anniversaries of losses. Patient endorses having a headache this AM and last night some nausea but this has improved. Patient's headache was likely 2/2 to her OSA or could be from restarting Prozac. Despite this patient has improved and is asymptomatic. Patient would benefit from going up on her Prozac. Patient is endorsing some anxiety. Patient is moving on the unit more, but still having a hard time completing some tasks to help with care and endorses feeling hopeless about her life. Patient has numerous medical concerns she wants addressed and has been using this as her motivation to get out of her room. Boundaries will have to be placed with the patient to help patient understand the services offered and better address the reasons that patient was admitted.   MDD, recurrent, severe w/ psychotic features ( R/O SIMD vs SIPD) Hx of PTSD - Increase Prozac to 20mg  for depression/anxiety - Continue home Vistaril 25mg  TID PRN anxiety - Increase home gabapentin to 400mg  TID to help with anxiety and residual pain issues - Continue home medication Zyprexa 5mg  QHS for psychosis - Shower assistance and prompts to attend to ADLs - Start melatonin 3mg  QHS for sleep   Elevated A1c With patient's elevated A1c, current HLD, and obesity in conjunction with Zyprexa use we will start patient on Metformin. - Start Metformin 500mg  daily- Daily CBG's    Stimulant use disorder, severe, cocaine Cannabis use disorder - counseled on need to abstain from use - will discuss outpatient SA treatment options with SW - Monitoring for signs of withdrawal   CVA history:  - continue home Plavix 75mg , hx of CVA - Continue home ASA, 81 mg, hx of CVA   HLD -- Continue home Lipitor 40mg    Tobacco use disorder -  Decrease to 14mg  patch due to reported Tobacco use of 0.5ppd    GERD - Protonix 40mg  daily   COPD - Continue home Dulera 2 puffs bid - Continue home Incruse Ellipta 1 puff daily - Continue home Albuterol PRN - Continue home Singulair 10mg  daily    HTN - Norvasc 10mg  daily - Clonidine 0.1mg  q8 hours PRN SBP>160 or DBP>100; hold for HR <60   CP - resolved - Sent to ED with negative Troponin and CXR shows atelectasis; EKG unchanged compared to previous tracing - per ED no further w/u indicated at this time   Abnormal UA - Urine culture - multiple species will recollect   Mobility issues/R knee pain  - continue wheelchair and walker and knee immobilizer - Leg rest ordered for wheelchair , for day time use ONLY - Voltaren gel and Robaxin 500mg  TID PRN for pain - SW request for PCP to address knee issues     PGY-2 Freida Busman, MD 11/24/2021, 3:38 PM

## 2021-11-24 NOTE — BHH Counselor (Signed)
CSW contacted patient boyfriend, Ebony Hail, who was unaware that patient was in the hospital.  Ebony Hail reports that he is now leaving with his father and is unsure if patient would be able to stay with them.  CSW provided patient the unit number to get in contact with patient.  CSW also let patient know that he wanted a phone call from patient and assisted patient in contacting him.  It went to voicemail and patient tried to leave a voicemail.    Karema Tocci, LCSW, [place] Social Worker  [facility]

## 2021-11-24 NOTE — Group Note (Signed)
LCSW Group Therapy Note   Group Date: 11/24/2021 Start Time: 1300 End Time: 84  LCSW Aftercare Discharge Planning Group Note   11/24/2021   Type of Group and Topic: Psychoeducational Group: Discharge Planning   Participation Level: Minimal    Description of Group Discharge planning group reviews patients anticipated discharge plans and assists patients to anticipate and address any barriers to wellness/recovery in the community. Suicide prevention education is reviewed with patients in group.   Therapeutic Goals 1. Patients will state their anticipated discharge plan and mental health aftercare 2. Patients will identify potential barriers to wellness in the community setting 3. Patients will engage in problem solving, solution focused discussion of ways to anticipate and address barriers to wellness/recovery   Summary of Patient Progress: Pt was attentive during discussion.  Pt came into group late.   Therapeutic Modalities: Motivational Interviewing  Mliss Fritz, Latanya Presser 11/24/2021  1:51 PM

## 2021-11-24 NOTE — BHH Group Notes (Signed)
Adult Psychoeducational Group Note  Date:  11/24/2021 Time:  2:38 PM  Group Topic/Focus:  Wellness Toolbox:   The focus of this group is to discuss various aspects of wellness, balancing those aspects and exploring ways to increase the ability to experience wellness.  Patients will create a wellness toolbox for use upon discharge.  Participation Level:  Active  Participation Quality:  Attentive  Affect:  Appropriate  Cognitive:  Alert  Insight: Appropriate  Engagement in Group:  Engaged  Modes of Intervention:  Activity  Additional Comments:  Patient attended and participated in the relaxation group activity.  Jearl Klinefelter 11/24/2021, 2:38 PM
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
## 2021-11-24 NOTE — Progress Notes (Signed)
Pt denies SI/HI/AVH and verbally agrees to approach staff if these become apparent or before harming themselves/others. Rates depression 9/10. Rates anxiety 7/10. Rates pain 8/10 in right leg. Pt has been extremely needy throughout the day. Pt has been requesting a leg rest for the wheelchair, which we do not have. Pt has been asking for more clothes. Pt has been asking for more books. Pt states that she is lightheaded. BP was high. PRNs given. MD notified. BP rechecked and has improved. MD notified. Pt has had congestion, PRN ordered and given. Pt states she wants a shower chair to take a shower. Pt took a shower with chair. Scheduled medications administered to pt, per MD orders. RN provided support and encouragement to pt. Q15 min safety checks implemented and continued. Pt safe on the unit. RN will continue to monitor and intervene as needed.   11/24/21 0801  Psych Admission Type (Psych Patients Only)  Admission Status Voluntary  Psychosocial Assessment  Patient Complaints Anxiety;Depression;Other (Comment) (pain)  Eye Contact Fair  Facial Expression Anxious;Pained  Affect Depressed;Anxious  Speech Logical/coherent  Interaction Needy;Attention-seeking  Motor Activity Slow;Unsteady  Appearance/Hygiene Poor hygiene;Disheveled  Behavior Characteristics Cooperative;Anxious  Mood Depressed;Anxious  Thought Process  Coherency Concrete thinking  Content Blaming others;Blaming self  Delusions None reported or observed  Perception WDL  Hallucination None reported or observed  Judgment Poor  Confusion None  Danger to Self  Current suicidal ideation? Denies  Self-Injurious Behavior No self-injurious ideation or behavior indicators observed or expressed   Agreement Not to Harm Self Yes  Description of Agreement Verbal Contract  Danger to Others  Danger to Others None reported or observed

## 2021-11-24 NOTE — BHH Group Notes (Signed)
Patent attended the AA group.

## 2021-11-24 NOTE — Group Note (Signed)
Recreation Therapy Group Note   Group Topic:Stress Management  Group Date: 11/24/2021 Start Time: 0945 End Time: 1000 Facilitators: Victorino Sparrow, [name] Location: [address] Dayroom   Goal Area(s) Addresses:  Patient will identify positive stress management techniques. Patient will identify benefits of using stress management post d/c.   Group Description:  Meditation.  LRT played a meditation that focused on being resilient in the face of adversity.  Patients were encouraged to get comfortable and allow their breathing to relax them as much as possible.  Patients were to listen and follow along as meditation played to fully engage in the practice.   Affect/Mood: N/A   Participation Level: Did not attend    Clinical Observations/Individualized Feedback:     Plan: Continue to engage patient in RT group sessions 2-3x/week.   Victorino Sparrow, LRT,CTRS 11/24/2021 12:01 PM

## 2021-11-25 LAB — GLUCOSE, CAPILLARY
Glucose-Capillary: 155 mg/dL — ABNORMAL HIGH (ref 70–99)
Glucose-Capillary: 167 mg/dL — ABNORMAL HIGH (ref 70–99)

## 2021-11-25 MED ORDER — MELATONIN 3 MG PO TABS
6.0000 mg | ORAL_TABLET | Freq: Every day | ORAL | Status: DC
  Administered 2021-11-25 – 2021-11-28 (×4): 6 mg via ORAL
  Filled 2021-11-25: qty 1
  Filled 2021-11-25: qty 14
  Filled 2021-11-25 (×5): qty 2

## 2021-11-25 MED ORDER — METHOCARBAMOL 500 MG PO TABS
500.0000 mg | ORAL_TABLET | Freq: Four times a day (QID) | ORAL | Status: DC | PRN
  Administered 2021-11-25 – 2021-11-29 (×7): 500 mg via ORAL
  Filled 2021-11-25 (×6): qty 1

## 2021-11-25 MED ORDER — LORATADINE 10 MG PO TABS
10.0000 mg | ORAL_TABLET | Freq: Every day | ORAL | Status: DC
  Administered 2021-11-25 – 2021-11-29 (×5): 10 mg via ORAL
  Filled 2021-11-25: qty 7
  Filled 2021-11-25 (×7): qty 1

## 2021-11-25 NOTE — [facility] Group Notes (Signed)
Goals Group 11/25/21    Group Focus: affirmation, clarity of thought, and goals/reality orientation Treatment Modality:  Psychoeducation Interventions utilized were assignment, group exercise, and support Purpose: To be able to understand and verbalize the reason for their admission to the hospital. To understand that the medication helps with their chemical imbalance but they also need to work on their choices in life. To be challenged to develop a list of 30 positives about themselves. Also introduce the concept that "feelings" are not reality.  Participation Level:  Did not attend  Cathy Hall

## 2021-11-25 NOTE — Progress Notes (Signed)
Physical Therapy Treatment Patient Details Name: Cathy Hall MRN: [id number] DOB: [birth year] Today's Date: 11/25/2021   History of Present Illness pt at [facility] with attempts of SI with cocaine, and elevated BPs. Pt reports last year a year ago was in hosptial from [place] with injuries to R knee with ligamnets instability and wears a bledsoe brace anytime she is up mobilizing. Pt has had several strokes 1 year ago resulting in some weakness . uses RW and WC at home.    PT Comments    Pt in bed when I arrived. Was excited that I was able to find a R Leg rest for keeping her R LE off floor while propelling the WC. Pt stated she was not having a great day and declined walking in hallway, however she did walk to Bathroom with RW today for toileting and then back to her bed. Her KI was on however it was very far down her leg not supplying much support at all, however pt did not mention pain or instability while walking and was weight shifting on that leg with no difficulty.   R leg rest was donned to the [facility] and fit to her liking. She was much more congested sounding today with difficulty breathing during activity. I encouraged pt and nurse to walk with pt with RW a few times a day to keep her strength up. Pt tolerates walking well but limited with distance dur to endurance and SOB.      Recommendations for follow up therapy are one component of a multi-disciplinary discharge planning process, led by the attending physician.  Recommendations may be updated based on patient status, additional functional criteria and insurance authorization.  Follow Up Recommendations  Other (comment) (would beenfit from f/u with ortho MD for R knee and then see if OPPT could then follow)     Assistance Recommended at Discharge Intermittent Supervision/Assistance  Patient can return home with the following Help with stairs or ramp for entrance;Assist for transportation   Equipment Recommendations        Recommendations for Other Services       Precautions / Restrictions       Mobility  Bed Mobility Overal bed mobility: Independent                  Transfers Overall transfer level: Modified independent Equipment used: Rolling walker (2 wheels)               General transfer comment: used RW and used grab bar in batrhoom for stand pivot from [facility] to bathroom . Cues again  to lock the brakes on the WC and WC safety    Ambulation/Gait Ambulation/Gait assistance: Min guard Gait Distance (Feet): 20 Feet Assistive device: Rolling walker (2 wheels) Gait Pattern/deviations: Step-to pattern           Stairs             Wheelchair Mobility    Modified Rankin (Stroke Patients Only)       Balance                                            Cognition  Exercises      General Comments        Pertinent Vitals/Pain      Home Living                          Prior Function            PT Goals (current goals can now be found in the care plan section) Acute Rehab PT Goals Patient Stated Goal: I want to get better and get back to working at [employer] PT Goal Formulation: With patient Time For Goal Achievement: 11/30/21 Potential to Achieve Goals: Good Progress towards PT goals: Progressing toward goals    Frequency    Min 3X/week      PT Plan      Co-evaluation              AM-PAC PT "6 Clicks" Mobility   Outcome Measure  Help needed turning from your back to your side while in a flat bed without using bedrails?: None Help needed moving from lying on your back to sitting on the side of a flat bed without using bedrails?: None Help needed moving to and from a bed to a chair (including a wheelchair)?: None Help needed standing up from a chair using your arms (e.g., wheelchair or bedside chair)?: None Help needed to walk in hospital  room?: A Little Help needed climbing 3-5 steps with a railing? : A Little 6 Click Score: 22    End of Session   Activity Tolerance: Patient tolerated treatment well Patient left: in chair Nurse Communication: Mobility status PT Visit Diagnosis: Unsteadiness on feet (R26.81);Muscle weakness (generalized) (M62.81)     Time: [id number] PT Time Calculation (min) (ACUTE ONLY): 24 min  Charges:  $Gait Training: 8-22 mins                     Keyarah Mcroy, PT, MPT Acute Rehabilitation Services Office: [phone] Pager: [phone] 11/25/2021    Clide Dales 11/25/2021, 1:15 PM

## 2021-11-25 NOTE — Progress Notes (Signed)
Patient has been up in the dayroom interacting with peers. She was compliant with her  meds.  She appears winded at times while in the wheelchair moving around.   Encouraged to move slower instead

## 2021-11-25 NOTE — [facility] Group Notes (Signed)
.  Psychoeducational Group Note ° ° ° °Date:11/25/21 °Time: 1300-1400 ° ° ° °Purpose of Group: . The group focus' on teaching patients on how to identify their needs and their Life Skills:  Hall group where two lists are made. What people need and what are things that we do that are unhealthy. The lists are developed by the patients and it is explained that we often do the actions that are not healthy to get our list of needs met. ° °Goal:: to develop the coping skills needed to get their needs met ° °Participation Level:  Did not attend °Cathy Hall ° °

## 2021-11-25 NOTE — Progress Notes (Signed)
Pt has been tired today and spent breakfast and lunch time in her room.  Pt eventually came out of her room after lunch and was seen in the dayroom and going to the cafeteria for dinner. Pt c/o of shortness of breath.  O2 sats 99 percent and HR 88l at 1915.  Pt taking deep breaths and appears to be responding well to therapeutic touch.

## 2021-11-25 NOTE — Progress Notes (Signed)
[facility] Group Notes:  (Nursing/MHT/Case Management/Adjunct)  Date:  11/25/2021  Time:  2015  Type of Therapy:   wrap up group  Participation Level:  Active  Participation Quality:  Appropriate, Attentive, Sharing, and Supportive  Affect:  Depressed  Cognitive:  Alert  Insight:  Improving  Engagement in Group:  Engaged  Modes of Intervention:  Clarification, Education, and Support  Summary of Progress/Problems: Positive thinking and positive change were discussed.   Marcille Buffy 11/25/2021, 9:27 PM

## 2021-11-25 NOTE — Progress Notes (Addendum)
[facility] MD Progress Note  11/25/2021 3:33 PM Cathy Hall  MRN:  [id number] Subjective:  Cathy Hall reports "I feel a little out of whack. I just don't feel good in my body".    Daily Notes, 11/25/2021: Patient's chart reviewed, case discussed with her treatment team. Pt with flat affect and depressed mood, attention to personal hygiene and grooming is poor, eye contact is fair, speech is clear & coherent. Thought contents are organized and logical, and pt currently denies SI/HI/AVH or paranoia. There is no evidence of delusional thoughts.    Patient reports that her sleep quality last night fair, she reports that it took her some time to fall asleep, and that the Melatonin was not helpful. Pt reports that she has a history of sleep apnea, and was told in the past that she needs to use a CPAP machine. Pt unable to tell which provider gave her this information, and is unable to tell what the settings of her CPAP machine are supposed to be, and states that she has never been compliant with using the machine. Pt is on a hospital type bed, so will let her nursing staff know to elevate the head of the bed to a 20-30 degree angle at night when pt is sleeping. Pt reports that her appetite is "up and down", and reports pain of 8 (10 being worst) to her right leg. She currently has Tylenol and Robaxin ordered as needed for pain. LLE with an immobilizer, but pt states that she is able to transfer herself from bed to wheelchair and wheel herself to use the bathroom. Pt educated to call for assistance using call bell in the room as needed, and verbalizes understanding.   V/S are WNL, will increase Melatonin to 6 mg nightly for insomnia. Will abstain from medications for insomnia that are might cause oversedation due to history of sleep apnea.  Will continue other current medications.  Reason for Admission: Cathy Hall is a 51 y.o. female with history of depression, cocaine abuse,  hypertension, COPD, and stroke with left-sided weakness.  Patient presented voluntarily to [facility] with complaint of worsening depression, +SI with a plan to overdose on medications. BP at the [facility] was 222/134 and pt was symptomatic with dizziness. She reported not taking her antihypertensives x 2 weeks. She was transferred to [facility] ED, was medically stabilized, and transferred to [facility] on 11/22/2019 for treatment and stabilization of her mood.   Principal Problem: MDD (major depressive disorder), recurrent, severe, with psychosis ([place]) Diagnosis: Principal Problem:   MDD (major depressive disorder), recurrent, severe, with psychosis ([place]) Active Problems:   Cocaine abuse ([place])  Past Psychiatric History: See H&P  Past Medical History:  Past Medical History:  Diagnosis Date   Anxiety    Asthma    COPD (chronic obstructive pulmonary disease) ([place])    Depression    Hypertension    Major depression    PTSD (post-traumatic stress disorder)     Past Surgical History:  Procedure Laterality Date   ECTOPIC PREGNANCY SURGERY     Family History:  Family History  Problem Relation Age of Onset   Cerebral aneurysm Mother    Diabetes Sister    Other Neg Hx    Family Psychiatric  History: See H&P Social History:  Social History   Substance and Sexual Activity  Alcohol Use Yes   Comment: occasional     Social History   Substance and Sexual Activity  Drug Use Yes   Types: Cocaine, Marijuana  Comment: last cocaine use 3 days ago    Social History   Socioeconomic History   Marital status: Significant Other    Spouse name: Not on file   Number of children: Not on file   Years of education: Not on file   Highest education level: Not on file  Occupational History   Not on file  Tobacco Use   Smoking status: Every Day    Packs/day: 1.00    Types: Cigarettes   Smokeless tobacco: Never  Vaping Use   Vaping Use: Never used  Substance and Sexual Activity   Alcohol use: Yes     Comment: occasional   Drug use: Yes    Types: Cocaine, Marijuana    Comment: last cocaine use 3 days ago   Sexual activity: Not Currently  Other Topics Concern   Not on file  Social History Narrative   ** Merged History Encounter **       Social Determinants of Health   Financial Resource Strain: Not on file  Food Insecurity: Not on file  Transportation Needs: Not on file  Physical Activity: Not on file  Stress: Not on file  Social Connections: Not on file    Sleep: Poor per patient report; 8.75 hours per staff  Appetite:  Good  Current Medications: Current Facility-Administered Medications  Medication Dose Route Frequency Provider Last Rate Last Admin   acetaminophen (TYLENOL) tablet 650 mg  650 mg Oral Q6H PRN Rankin, Shuvon B, NP   650 mg at 11/24/21 2137   albuterol (VENTOLIN HFA) 108 (90 Base) MCG/ACT inhaler 1-2 puff  1-2 puff Inhalation Q4H PRN Rankin, Shuvon B, NP   1 puff at 11/21/21 1717   alum & mag hydroxide-simeth (MAALOX/MYLANTA) 200-200-20 MG/5ML suspension 30 mL  30 mL Oral Q4H PRN Rankin, Shuvon B, NP       amLODipine (NORVASC) tablet 10 mg  10 mg Oral Daily Rankin, Shuvon B, NP   10 mg at 11/25/21 1017   aspirin EC tablet 325 mg  325 mg Oral Daily Rankin, Shuvon B, NP   325 mg at 11/25/21 1017   atorvastatin (LIPITOR) tablet 40 mg  40 mg Oral Daily Rankin, Shuvon B, NP   40 mg at 11/25/21 1017   cloNIDine (CATAPRES) tablet 0.1 mg  0.1 mg Oral Q8H PRN Nelda Marseille, Eugene Zeiders E, MD   0.1 mg at 11/24/21 1226   clopidogrel (PLAVIX) tablet 75 mg  75 mg Oral Daily Rankin, Shuvon B, NP   75 mg at 11/25/21 1021   diclofenac Sodium (VOLTAREN) 1 % topical gel 2 g  2 g Topical QID Damita Dunnings B, MD   2 g at 11/25/21 1019   FLUoxetine (PROZAC) capsule 20 mg  20 mg Oral Daily McQuilla, Joyce Gross B, MD   20 mg at 11/25/21 1017   fluticasone (FLONASE) 50 MCG/ACT nasal spray 1 spray  1 spray Each Nare Daily Damita Dunnings B, MD   1 spray at 11/25/21 1018   gabapentin (NEURONTIN) capsule  400 mg  400 mg Oral TID Damita Dunnings B, MD   400 mg at 11/25/21 1305   hydrOXYzine (ATARAX) tablet 25 mg  25 mg Oral TID PRN Harlow Asa, MD   25 mg at 11/25/21 1313   influenza vac split quadrivalent PF (FLUARIX) injection 0.5 mL  0.5 mL Intramuscular Tomorrow-1000 Gunnison Chahal E, MD       magnesium hydroxide (MILK OF MAGNESIA) suspension 30 mL  30 mL Oral Daily PRN Rankin, Shuvon B,  NP       melatonin tablet 6 mg  6 mg Oral QHS Nkwenti, Doris, NP       metFORMIN (GLUCOPHAGE) tablet 500 mg  500 mg Oral Q breakfast Damita Dunnings B, MD   500 mg at 11/25/21 1017   methocarbamol (ROBAXIN) tablet 500 mg  500 mg Oral Q8H PRN Nelda Marseille, Javonna Balli E, MD   500 mg at 11/25/21 1017   mometasone-formoterol (DULERA) 100-5 MCG/ACT inhaler 2 puff  2 puff Inhalation BID Rankin, Shuvon B, NP   2 puff at 11/25/21 1020   montelukast (SINGULAIR) tablet 10 mg  10 mg Oral QHS Nelda Marseille, Shayley Medlin E, MD   10 mg at 11/24/21 2137   multivitamin with minerals tablet 1 tablet  1 tablet Oral Daily Rankin, Shuvon B, NP   1 tablet at 11/25/21 1017   nicotine (NICODERM CQ - dosed in mg/24 hours) patch 14 mg  14 mg Transdermal Daily Damita Dunnings B, MD   14 mg at 11/25/21 1016   OLANZapine (ZYPREXA) tablet 5 mg  5 mg Oral QHS Damita Dunnings B, MD   5 mg at 11/24/21 2137   ondansetron (ZOFRAN-ODT) disintegrating tablet 4 mg  4 mg Oral Q8H PRN Damita Dunnings B, MD       pantoprazole (PROTONIX) EC tablet 40 mg  40 mg Oral Daily Rankin, Shuvon B, NP   40 mg at 11/25/21 1017   umeclidinium bromide (INCRUSE ELLIPTA) 62.5 MCG/ACT 1 puff  1 puff Inhalation Daily Harlow Asa, MD   1 puff at 11/25/21 1018    Lab Results:  Results for orders placed or performed during the hospital encounter of 11/21/21 (from the past 48 hour(s))  Glucose, capillary     Status: Abnormal   Collection Time: 11/24/21  6:28 AM  Result Value Ref Range   Glucose-Capillary 146 (H) 70 - 99 mg/dL    Comment: Glucose reference range applies only to samples taken  after fasting for at least 8 hours.  Glucose, capillary     Status: Abnormal   Collection Time: 11/24/21  9:24 PM  Result Value Ref Range   Glucose-Capillary 180 (H) 70 - 99 mg/dL    Comment: Glucose reference range applies only to samples taken after fasting for at least 8 hours.  Glucose, capillary     Status: Abnormal   Collection Time: 11/25/21  5:56 AM  Result Value Ref Range   Glucose-Capillary 155 (H) 70 - 99 mg/dL    Comment: Glucose reference range applies only to samples taken after fasting for at least 8 hours.    Blood Alcohol level:  Lab Results  Component Value Date   ETH <10 11/20/2021   ETH <10 0000000    Metabolic Disorder Labs: Lab Results  Component Value Date   HGBA1C 6.5 (H) 11/22/2021   MPG 139.85 11/22/2021   MPG 139.85 11/22/2021   No results found for: PROLACTIN Lab Results  Component Value Date   CHOL 169 11/22/2021   TRIG 264 (H) 11/22/2021   HDL 42 11/22/2021   CHOLHDL 4.0 11/22/2021   VLDL 53 (H) 11/22/2021   LDLCALC 74 11/22/2021   LDLCALC 65 11/22/2021    Physical Findings: AIMS: Facial and Oral Movements Muscles of Facial Expression: None, normal Lips and Perioral Area: None, normal Jaw: None, normal Tongue: None, normal,Extremity Movements Upper (arms, wrists, hands, fingers): None, normal Lower (legs, knees, ankles, toes): None, normal, Trunk Movements Neck, shoulders, hips: None, normal, Overall Severity Severity of abnormal movements (highest score  from questions above): None, normal Incapacitation due to abnormal movements: None, normal Patient's awareness of abnormal movements (rate only patient's report): No Awareness, Dental Status Current problems with teeth and/or dentures?: No Does patient usually wear dentures?: No    Musculoskeletal: Strength & Muscle Tone: untested Gait & Station: using wheelchair for ambulation Patient leans: N/A  Psychiatric Specialty Exam:  Presentation  General Appearance: -- Obese,  disheveled   Eye Contact:Fair   Speech:clear and coherent, normal rate   Speech Volume:Decreased   Mood and Affect  Mood: Depressed   Affect: Flat     Thought Process  Thought Processes:Coherent   Descriptions of Associations:Intact   Orientation:Full (Time, Place and Person)   Thought Content: WNL   Suicidal Thoughts:Denies  Homicidal Thoughts:Homicidal Thoughts: No    Sensorium  Memory:Immediate Fair; Recent Fair; Remote Fair   Judgment:-- Fair    Insight:Shallow    Executive Functions  Concentration:Fair   Attention Span:Fair   [place]    Psychomotor Activity  Psychomotor Activity:Using wheelchair - no tremors or restlessness noted    Assets  Assets:Resilience    Sleep  Poor   Physical Exam HENT:     Head: Normocephalic and atraumatic.  Pulmonary:     Effort: Pulmonary effort is normal.  Skin:    General: Skin is dry.  Neurological:     Mental Status: She is alert.   Review of Systems  Constitutional: Negative.  Negative for fever.  HENT: Negative.  Negative for congestion and sore throat.   Eyes: Negative.   Respiratory: Negative.  Negative for cough, hemoptysis, sputum production and shortness of breath.   Cardiovascular:  Negative for chest pain.  Gastrointestinal: Negative.  Negative for nausea.  Genitourinary: Negative.   Musculoskeletal:  Positive for joint pain and myalgias.  Skin: Negative.  Negative for itching and rash.  Neurological: Negative.  Negative for headaches.  Psychiatric/Behavioral:  Positive for depression and substance abuse (cocaine abuse). Negative for hallucinations, memory loss and suicidal ideas. The patient is nervous/anxious and has insomnia.   Blood pressure 124/74, pulse 81, temperature 98.2 F (36.8 C), temperature source Oral, resp. rate 18, height 5\' 1"  (1.549 m), weight 106.6 kg, SpO2 97 %. Body mass index is 44.4 kg/m.  Treatment Plan Summary: Daily  contact with patient to assess and evaluate symptoms and progress in treatment and Medication management  MDD, recurrent, severe w/ psychotic features ( R/O SIMD vs SIPD) Hx of PTSD - Continue Prozac 20mg  for depression/anxiety - Continue home Vistaril 25mg  TID PRN anxiety - Continue gabapentin 400mg  TID to help with anxiety and residual pain issues - Continue home medication Zyprexa 5mg  QHS for psychosis - Shower assistance and prompts to attend to ADLs   Elevated A1c With patient's elevated A1c, current HLD, and obesity in conjunction with Zyprexa use we will start patient on Metformin. - Continue Metformin 500mg  daily- Daily CBG's    Stimulant use disorder, severe, cocaine Cannabis use disorder - counseled on need to abstain from use - will discuss outpatient SA treatment options with SW - Monitoring for signs of withdrawal   CVA history:  - continue home Plavix 75mg , hx of CVA - Continue home ASA, 81 mg, hx of CVA   HLD -- Continue home Lipitor 40mg    Tobacco use disorder -  Continue Nicoderm patch 14mg  patch daily   GERD - Protonix 40mg  daily   COPD - Continue home Dulera 2 puffs bid - Continue home Incruse Ellipta  1 puff daily - Continue home Albuterol PRN - Continue home Singulair 10mg  daily   Insomnia -Increase Melatonin to 6 mg nightly   HTN - Norvasc 10mg  daily - Clonidine 0.1mg  q8 hours PRN SBP>160 or DBP>100; hold for HR <60   CP - resolved - Sent to ED with negative Troponin and CXR shows atelectasis; EKG unchanged compared to previous tracing - per ED no further w/u indicated at this time   Abnormal UA - Urine culture - multiple species will recollect   Mobility issues/R knee pain  - continue wheelchair and walker and knee immobilizer - Leg rest ordered for wheelchair , for day time use ONLY - Voltaren gel and Robaxin 500mg  TID PRN for pain - SW request for PCP to address knee issues   Nicholes Rough, NP 11/25/2021, 3:33 PM Patient ID: Leola Brazil, female   DOB: 02-10-1971, 51 y.o.   MRN: [id number]

## 2021-11-25 NOTE — Progress Notes (Signed)
OT Cancellation Note  Patient Details Name: Betheny Suchecki MRN: [id number] DOB: [birth year]   Cancelled Treatment:    Reason Eval/Treat Not Completed: Other (comment) PT to come address leg rest issue on this date. OT signing off at this time.  Sharyn Blitz OTR/L, MS Acute Rehabilitation Department Office# [phone] Pager# [phone]   11/25/2021, 10:55 AM

## 2021-11-25 NOTE — Group Note (Signed)
[facility] Group Notes: (Clinical Social Work)   11/25/2021      Type of Therapy:  Group Therapy   Participation Level:  Did Not Attend - was invited both individually by MHT and by overhead announcement, chose not to attend.   Ambrose Mantle, LCSW 11/25/2021, 3:58 PM

## 2021-11-26 LAB — RESP PANEL BY RT-PCR (FLU A&B, COVID) ARPGX2
Influenza A by PCR: NEGATIVE
Influenza B by PCR: NEGATIVE
SARS Coronavirus 2 by RT PCR: NEGATIVE

## 2021-11-26 LAB — GLUCOSE, CAPILLARY
Glucose-Capillary: 147 mg/dL — ABNORMAL HIGH (ref 70–99)
Glucose-Capillary: 211 mg/dL — ABNORMAL HIGH (ref 70–99)

## 2021-11-26 MED ORDER — OLANZAPINE 7.5 MG PO TABS
7.5000 mg | ORAL_TABLET | Freq: Every day | ORAL | Status: DC
  Administered 2021-11-26 – 2021-11-28 (×3): 7.5 mg via ORAL
  Filled 2021-11-26: qty 7
  Filled 2021-11-26 (×4): qty 1

## 2021-11-26 MED ORDER — TRAZODONE HCL 50 MG PO TABS
50.0000 mg | ORAL_TABLET | Freq: Every day | ORAL | Status: DC
  Administered 2021-11-26 – 2021-11-28 (×3): 50 mg via ORAL
  Filled 2021-11-26: qty 1
  Filled 2021-11-26: qty 7
  Filled 2021-11-26 (×3): qty 1

## 2021-11-26 NOTE — Progress Notes (Signed)
[facility] Group Notes:  (Nursing/MHT/Case Management/Adjunct)  Date:  11/26/2021  Time:  2015  Type of Therapy:   wrap up group  Participation Level:  Active  Participation Quality:  Appropriate, Attentive, Sharing, and Supportive  Affect:  Depressed  Cognitive:  Alert  Insight:  Improving  Engagement in Group:  Engaged  Modes of Intervention:  Clarification, Education, and Socialization  Summary of Progress/Problems: Positive thinking and self=care were discussed.   Marcille Buffy 11/26/2021, 9:47 PM

## 2021-11-26 NOTE — [facility] Group Notes (Signed)
Psychoeducational Group Note ° °Date:  11/12/2021 °Time:  1300-1400 ° ° °Group Topic/Focus: This is a continuation of the group from Saturday. Pt's have been asked to formulate a list of 30 positives about themselves. This list is to be read 2 times a day for 30 days, looking in a mirror. Changing patterns of negative self talk. Also discussed is the fact that there have been some people who hurt us in the past. We keep that memory alive within us. Ways to cope with this are discused ° ° °Participation Level: did not attend °Kuba Shepherd A ° °

## 2021-11-26 NOTE — Group Note (Signed)
Date:  11/26/2021 °Time:  9:58 AM ° °Group Topic/Focus:  °Orientation:   The focus of this group is to educate the patient on the purpose and policies of crisis stabilization and provide a format to answer questions about their admission.  The group details unit policies and expectations of patients while admitted. ° ° ° °Participation Level:  Did Not Attend ° °Participation Quality:   ° °Affect:   ° °Cognitive:   ° °Insight:  ° °Engagement in Group:   ° °Modes of Intervention:   ° °Additional Comments:   ° °Vin Yonke Lashawn Valerio Pinard °11/26/2021, 9:58 AM ° °

## 2021-11-26 NOTE — Progress Notes (Addendum)
[facility] MD Progress Note  11/26/2021 12:47 PM Cathy Hall  MRN:  [id number] Subjective:  Aseel reports "I feel a little out of whack. I just don't feel good in my body".    Daily Notes, 11/26/2021: Patient's chart reviewed, case discussed with her treatment team. Pt with flat affect and depressed mood, attention to personal hygiene and grooming is poor, eye contact is fair, speech is clear & coherent. Thought contents are organized and pt currently denies SI/HI, but endorses +AH of voices stating nonspecific things earlier today. Pt reports  visual hallucinations of seeing shadows but denies paranoia, and there is no evidence of delusional thoughts.    Patient reports that her sleep quality last night was poor, she reports a good appetite, and up until this morning, had been reclusive to her room. Pt became tearful and argumentative when attending Physician stressed the importance of attending groups, and not lying in bed all day. The treatment team has considered locking pt's room during the day so that she can go to group sessions, but she is currently agreeable to attending group sessions and has attended one group session so far.   HR slightly elevated at 102, rest of V/S are WNL, pt reports poor sleep last night on Melatonin to 6 mg nightly for insomnia. Will add Trazodone 50 mg nightly. Pt educated on the benefits, rational and possible side effects of Trazodone, and is agreeable to trying it at 50 mg starting tonight.  Will increase Zyprexa to 7.5 mg nightly to better manage pt's psychosis. Respiratory panel ordered due to congestion. Pt states that congestion is due to seasonal allergies. Claritin 10 mg daily also ordered.  Reason for Admission: Cathy Hall is a 51 y.o. female with history of depression, cocaine abuse, hypertension, COPD, and stroke with left-sided weakness.  Patient presented voluntarily to [facility] with complaint of worsening depression, +SI with a  plan to overdose on medications. BP at the [facility] was 222/134 and pt was symptomatic with dizziness. She reported not taking her antihypertensives x 2 weeks. She was transferred to [facility] ED, was medically stabilized, and transferred to [facility] on 11/22/2019 for treatment and stabilization of her mood.   Principal Problem: MDD (major depressive disorder), recurrent, severe, with psychosis (HCC) Diagnosis: Principal Problem:   MDD (major depressive disorder), recurrent, severe, with psychosis (HCC) Active Problems:   Cocaine abuse (HCC)  Past Psychiatric History: See H&P  Past Medical History:  Past Medical History:  Diagnosis Date   Anxiety    Asthma    COPD (chronic obstructive pulmonary disease) (HCC)    Depression    Hypertension    Major depression    PTSD (post-traumatic stress disorder)     Past Surgical History:  Procedure Laterality Date   ECTOPIC PREGNANCY SURGERY     Family History:  Family History  Problem Relation Age of Onset   Cerebral aneurysm Mother    Diabetes Sister    Other Neg Hx    Family Psychiatric  History: See H&P Social History:  Social History   Substance and Sexual Activity  Alcohol Use Yes   Comment: occasional     Social History   Substance and Sexual Activity  Drug Use Yes   Types: Cocaine, Marijuana   Comment: last cocaine use 3 days ago    Social History   Socioeconomic History   Marital status: Significant Other    Spouse name: Not on file   Number of children: Not on file  Years of education: Not on file   Highest education level: Not on file  Occupational History   Not on file  Tobacco Use   Smoking status: Every Day    Packs/day: 1.00    Types: Cigarettes   Smokeless tobacco: Never  Vaping Use   Vaping Use: Never used  Substance and Sexual Activity   Alcohol use: Yes    Comment: occasional   Drug use: Yes    Types: Cocaine, Marijuana    Comment: last cocaine use 3 days ago   Sexual activity: Not Currently  Other  Topics Concern   Not on file  Social History Narrative   ** Merged History Encounter **       Social Determinants of Health   Financial Resource Strain: Not on file  Food Insecurity: Not on file  Transportation Needs: Not on file  Physical Activity: Not on file  Stress: Not on file  Social Connections: Not on file    Sleep: Poor per patient report; 8.75 hours per staff  Appetite:  Good  Current Medications: Current Facility-Administered Medications  Medication Dose Route Frequency Provider Last Rate Last Admin   acetaminophen (TYLENOL) tablet 650 mg  650 mg Oral Q6H PRN Rankin, Shuvon B, NP   650 mg at 11/25/21 1552   albuterol (VENTOLIN HFA) 108 (90 Base) MCG/ACT inhaler 1-2 puff  1-2 puff Inhalation Q4H PRN Rankin, Shuvon B, NP   2 puff at 11/26/21 1011   alum & mag hydroxide-simeth (MAALOX/MYLANTA) 200-200-20 MG/5ML suspension 30 mL  30 mL Oral Q4H PRN Rankin, Shuvon B, NP       amLODipine (NORVASC) tablet 10 mg  10 mg Oral Daily Rankin, Shuvon B, NP   10 mg at 11/26/21 1006   aspirin EC tablet 325 mg  325 mg Oral Daily Rankin, Shuvon B, NP   325 mg at 11/26/21 1007   atorvastatin (LIPITOR) tablet 40 mg  40 mg Oral Daily Rankin, Shuvon B, NP   40 mg at 11/26/21 1006   cloNIDine (CATAPRES) tablet 0.1 mg  0.1 mg Oral Q8H PRN Nelda Marseille, Hennie Gosa E, MD   0.1 mg at 11/24/21 1226   clopidogrel (PLAVIX) tablet 75 mg  75 mg Oral Daily Rankin, Shuvon B, NP   75 mg at 11/26/21 1006   diclofenac Sodium (VOLTAREN) 1 % topical gel 2 g  2 g Topical QID Damita Dunnings B, MD   2 g at 11/25/21 2137   FLUoxetine (PROZAC) capsule 20 mg  20 mg Oral Daily Damita Dunnings B, MD   20 mg at 11/26/21 1006   fluticasone (FLONASE) 50 MCG/ACT nasal spray 1 spray  1 spray Each Nare Daily Damita Dunnings B, MD   1 spray at 11/26/21 1012   gabapentin (NEURONTIN) capsule 400 mg  400 mg Oral TID Damita Dunnings B, MD   400 mg at 11/26/21 1006   hydrOXYzine (ATARAX) tablet 25 mg  25 mg Oral TID PRN Harlow Asa, MD   25  mg at 11/25/21 2140   influenza vac split quadrivalent PF (FLUARIX) injection 0.5 mL  0.5 mL Intramuscular Tomorrow-1000 Nelda Marseille, Bernadett Milian E, MD       loratadine (CLARITIN) tablet 10 mg  10 mg Oral Daily Nkwenti, Doris, NP   10 mg at 11/26/21 1007   magnesium hydroxide (MILK OF MAGNESIA) suspension 30 mL  30 mL Oral Daily PRN Rankin, Shuvon B, NP       melatonin tablet 6 mg  6 mg Oral QHS Nkwenti, Doris,  NP   6 mg at 11/25/21 2137   metFORMIN (GLUCOPHAGE) tablet 500 mg  500 mg Oral Q breakfast Damita Dunnings B, MD   500 mg at 11/26/21 1006   methocarbamol (ROBAXIN) tablet 500 mg  500 mg Oral Q6H PRN Nicholes Rough, NP   500 mg at 11/26/21 1006   mometasone-formoterol (DULERA) 100-5 MCG/ACT inhaler 2 puff  2 puff Inhalation BID Rankin, Shuvon B, NP   2 puff at 11/26/21 1011   montelukast (SINGULAIR) tablet 10 mg  10 mg Oral QHS Nelda Marseille, Levana Minetti E, MD   10 mg at 11/25/21 2137   multivitamin with minerals tablet 1 tablet  1 tablet Oral Daily Rankin, Shuvon B, NP   1 tablet at 11/26/21 1006   nicotine (NICODERM CQ - dosed in mg/24 hours) patch 14 mg  14 mg Transdermal Daily Damita Dunnings B, MD   14 mg at 11/26/21 1010   OLANZapine (ZYPREXA) tablet 7.5 mg  7.5 mg Oral QHS Nkwenti, Doris, NP       ondansetron (ZOFRAN-ODT) disintegrating tablet 4 mg  4 mg Oral Q8H PRN Damita Dunnings B, MD       pantoprazole (PROTONIX) EC tablet 40 mg  40 mg Oral Daily Rankin, Shuvon B, NP   40 mg at 11/26/21 1006   traZODone (DESYREL) tablet 50 mg  50 mg Oral QHS Nkwenti, Doris, NP       umeclidinium bromide (INCRUSE ELLIPTA) 62.5 MCG/ACT 1 puff  1 puff Inhalation Daily Nelda Marseille, Jasmaine Rochel E, MD   1 puff at 11/26/21 1012    Lab Results:  Results for orders placed or performed during the hospital encounter of 11/21/21 (from the past 48 hour(s))  Glucose, capillary     Status: Abnormal   Collection Time: 11/24/21  9:24 PM  Result Value Ref Range   Glucose-Capillary 180 (H) 70 - 99 mg/dL    Comment: Glucose reference range applies  only to samples taken after fasting for at least 8 hours.  Glucose, capillary     Status: Abnormal   Collection Time: 11/25/21  5:56 AM  Result Value Ref Range   Glucose-Capillary 155 (H) 70 - 99 mg/dL    Comment: Glucose reference range applies only to samples taken after fasting for at least 8 hours.  Glucose, capillary     Status: Abnormal   Collection Time: 11/25/21  8:22 PM  Result Value Ref Range   Glucose-Capillary 167 (H) 70 - 99 mg/dL    Comment: Glucose reference range applies only to samples taken after fasting for at least 8 hours.   Comment 1 Notify RN    Comment 2 Document in Chart   Glucose, capillary     Status: Abnormal   Collection Time: 11/26/21  6:33 AM  Result Value Ref Range   Glucose-Capillary 147 (H) 70 - 99 mg/dL    Comment: Glucose reference range applies only to samples taken after fasting for at least 8 hours.    Blood Alcohol level:  Lab Results  Component Value Date   ETH <10 11/20/2021   ETH <10 0000000    Metabolic Disorder Labs: Lab Results  Component Value Date   HGBA1C 6.5 (H) 11/22/2021   MPG 139.85 11/22/2021   MPG 139.85 11/22/2021   No results found for: PROLACTIN Lab Results  Component Value Date   CHOL 169 11/22/2021   TRIG 264 (H) 11/22/2021   HDL 42 11/22/2021   CHOLHDL 4.0 11/22/2021   VLDL 53 (H) 11/22/2021   LDLCALC 74  11/22/2021   LDLCALC 65 11/22/2021    Physical Findings: AIMS: Facial and Oral Movements Muscles of Facial Expression: None, normal Lips and Perioral Area: None, normal Jaw: None, normal Tongue: None, normal,Extremity Movements Upper (arms, wrists, hands, fingers): None, normal Lower (legs, knees, ankles, toes): None, normal, Trunk Movements Neck, shoulders, hips: None, normal, Overall Severity Severity of abnormal movements (highest score from questions above): None, normal Incapacitation due to abnormal movements: None, normal Patient's awareness of abnormal movements (rate only patient's  report): No Awareness, Dental Status Current problems with teeth and/or dentures?: No Does patient usually wear dentures?: No    Musculoskeletal: Strength & Muscle Tone: untested Gait & Station: using wheelchair for ambulation Patient leans: N/A  Psychiatric Specialty Exam:  Presentation  General Appearance: -- Obese, disheveled   Eye Contact:Fair   Speech:clear and coherent, normal rate   Speech Volume:Normal   Mood and Affect  Mood: Depressed   Affect: Flat     Thought Process  Thought Processes:Coherent   Descriptions of Associations:Intact   Orientation:Full (Time, Place and Person)   Thought Content: Denies SI or HI; reports nonspecific AH - will not discuss content and VH of shadows; ruminative about various somatic issues; is not grossly responding to internal/external stimuli on exam; no delusional statements and denies ideas of reference or first rank symptoms   Suicidal Thoughts:Denies  Homicidal Thoughts:Homicidal Thoughts: No    Sensorium  Memory:Immediate Fair; Recent Fair; Remote Fair   Judgment:-- Poor   Insight: Poor    Executive Functions  Concentration:Fair   Attention Span:Fair   [place]    Psychomotor Activity  Psychomotor Activity:Using wheelchair - no tremors or restlessness noted    Assets  Assets:Resilience    Sleep  Total time unrecorded - noted to be sleeping during the day   Physical Exam HENT:     Head: Normocephalic and atraumatic.  Pulmonary:     Effort: Pulmonary effort is normal. No tachypnea.  Chest:     Chest wall: No mass or deformity.  Musculoskeletal:     Right lower leg: No edema.  Skin:    General: Skin is dry.     Coloration: Skin is not cyanotic.  Neurological:     Mental Status: She is alert and oriented to person, place, and time.   Review of Systems  Constitutional: Negative.  Negative for fever.  HENT: Negative.  Negative for congestion and sore  throat.   Eyes: Negative.   Respiratory: Negative.  Negative for cough, hemoptysis, sputum production and shortness of breath.   Cardiovascular:  Negative for chest pain.  Gastrointestinal: Negative.  Negative for nausea.  Genitourinary: Negative.   Musculoskeletal:  Positive for joint pain and myalgias.  Skin: Negative.  Negative for itching and rash.  Neurological: Negative.  Negative for headaches.  Psychiatric/Behavioral:  Positive for depression and substance abuse (cocaine abuse). Negative for hallucinations, memory loss and suicidal ideas. The patient is nervous/anxious and has insomnia.   Blood pressure 115/84, pulse (!) 102, temperature 97.9 F (36.6 C), temperature source Oral, resp. rate 18, height 5\' 1"  (1.549 m), weight 106.6 kg, SpO2 97 %. Body mass index is 44.4 kg/m.  Treatment Plan Summary: Daily contact with patient to assess and evaluate symptoms and progress in treatment and Medication management  MDD, recurrent, severe w/ psychotic features ( R/O SIMD vs SIPD) Hx of PTSD - Continue Prozac 20mg  for depression/anxiety - Continue home Vistaril 25mg  TID PRN anxiety - Continue gabapentin  400mg  TID to help with anxiety and residual pain issues - Increase home medication Zyprexa 7.5 mg QHS for psychosis - Shower assistance and prompts to attend to ADLs   Elevated A1c With patient's elevated A1c, current HLD, and obesity in conjunction with Zyprexa use we will start patient on Metformin. - Continue Metformin 500mg  daily- Daily CBG's    Stimulant use disorder, severe, cocaine Cannabis use disorder - counseled on need to abstain from use - will discuss outpatient SA treatment options with SW - Monitoring for signs of withdrawal   CVA history:  - continue home Plavix 75mg , hx of CVA - Continue home ASA, 81 mg, hx of CVA   HLD -- Continue home Lipitor 40mg    Tobacco use disorder -  Continue Nicoderm patch 14mg  patch daily   GERD - Protonix 40mg  daily   COPD -  Continue home Dulera 2 puffs bid - Continue home Incruse Ellipta 1 puff daily - Continue home Albuterol PRN - Continue home Singulair 10mg  daily   Insomnia -Continue Melatonin to 6 mg nightly -Start Trazodone 50 mg nightly    HTN - Norvasc 10mg  daily - Clonidine 0.1mg  q8 hours PRN SBP>160 or DBP>100; hold for HR <60   Seasonal Allergies Start Claritin 10 mg daily  CP - resolved - Sent to ED with negative Troponin and CXR shows atelectasis; EKG unchanged compared to previous tracing - per ED no further w/u indicated at this time   Abnormal UA - Urine culture - multiple species will recollect   Mobility issues/R knee pain  - continue wheelchair and walker and knee immobilizer - Leg rest ordered for wheelchair , for day time use ONLY - Voltaren gel and Robaxin 500mg  TID PRN for pain - SW request for PCP to address knee issues   Nicholes Rough, NP 11/26/2021, 12:47 PM Patient ID: Cathy Hall, female   DOB: Jun 25, 1971, 51 y.o.   MRN: [id number]

## 2021-11-26 NOTE — Progress Notes (Signed)
Patient has been up in the dayroom watching tv with peers. She was compliant with her medications. She continues to ask staff about metformin and what it is used for. Writer has explained the last 2 nights the use of this medication. Patient may need a consult for diabetes.

## 2021-11-26 NOTE — Progress Notes (Signed)
°   11/26/21 1000  Psych Admission Type (Psych Patients Only)  Admission Status Voluntary  Psychosocial Assessment  Patient Complaints Anxiety;Depression;Isolation  Eye Contact Fair  Facial Expression Anxious;Pained  Affect Depressed;Anxious  Speech Logical/coherent  Interaction Needy;Attention-seeking;Assertive  Motor Activity Slow;Unsteady  Appearance/Hygiene Poor hygiene;Disheveled  Behavior Characteristics Anxious  Mood Depressed;Anxious  Thought Process  Coherency WDL  Content WDL  Delusions None reported or observed  Perception WDL  Hallucination None reported or observed  Judgment WDL  Confusion None  Danger to Self  Current suicidal ideation? Denies  Self-Injurious Behavior No self-injurious ideation or behavior indicators observed or expressed   Agreement Not to Harm Self Yes  Description of Agreement Verbal Contract  Danger to Others  Danger to Others None reported or observed

## 2021-11-26 NOTE — B[facility] Group Notes (Addendum)
Adult Psychoeducational Group Not °Date:  11/26/2021 °Time:  0900-1045 °Group Topic/Focus: PROGRESSIVE RELAXATION. A group where deep breathing is taught and tensing and relaxation muscle groups is used. Imagery is used as well.  Pts are asked to imagine 3 pillars that hold them up when they are not able to hold themselves up. ° °Participation Level: did not attend ° °Jese Comella A ° ° °

## 2021-11-27 ENCOUNTER — Encounter (HOSPITAL_COMMUNITY): Payer: Self-pay

## 2021-11-27 LAB — URINE CULTURE

## 2021-11-27 LAB — GLUCOSE, CAPILLARY
Glucose-Capillary: 136 mg/dL — ABNORMAL HIGH (ref 70–99)
Glucose-Capillary: 217 mg/dL — ABNORMAL HIGH (ref 70–99)

## 2021-11-27 MED ORDER — HYDROXYZINE HCL 25 MG PO TABS
ORAL_TABLET | ORAL | Status: AC
  Filled 2021-11-27: qty 1

## 2021-11-27 MED ORDER — MAGIC MOUTHWASH
5.0000 mL | Freq: Three times a day (TID) | ORAL | Status: DC | PRN
  Administered 2021-11-27 – 2021-11-28 (×2): 5 mL via ORAL
  Filled 2021-11-27 (×3): qty 5

## 2021-11-27 MED ORDER — MAGIC MOUTHWASH
5.0000 mL | Freq: Three times a day (TID) | ORAL | Status: DC | PRN
  Filled 2021-11-27: qty 5

## 2021-11-27 MED ORDER — HYDROXYZINE HCL 25 MG PO TABS
25.0000 mg | ORAL_TABLET | Freq: Three times a day (TID) | ORAL | Status: DC | PRN
  Administered 2021-11-27: 25 mg via ORAL

## 2021-11-27 NOTE — Group Note (Signed)
Recreation Therapy Group Note   Group Topic:Stress Management  Group Date: 11/27/2021 Start Time: 0935 End Time: 0950 Facilitators: Caroll Rancher, [place] Location: [address] Dayroom   Goal Area(s) Addresses:  Patient will identify positive stress management techniques. Patient will identify benefits of using stress management post d/c.  Group Description:  Meditation.  LRT played a meditation that focused on bringing positive energy into the day.  Patients were to listen, follow along and mentally repeat the affirmations that were being presented during the meditation.     Affect/Mood: Appropriate   Participation Level: Active   Participation Quality: Independent   Behavior: Appropriate   Speech/Thought Process: Focused   Insight: Good   Judgement: Good   Modes of Intervention: Meditation   Patient Response to Interventions:  Engaged   Education Outcome:  Acknowledges education and In group clarification offered    Clinical Observations/Individualized Feedback: Pt attended and participated in activity.    Plan: Continue to engage patient in RT group sessions 2-3x/week.   Caroll Rancher, LRT,CTRS 11/27/2021 11:50 AM

## 2021-11-27 NOTE — BHH Counselor (Addendum)
9:45am: CSW attempted to call patient son, [name], [phone], regarding patient disposition and ability to live with him.  Son did not pick up. CSW left a voicemail for a call back regarding patient disposition.   12:50pm: CSW attempted to call again regarding disposition for patient. Son did not answer. CSW left a HIPPA compliant message.  CSW will continue to update about disposition as updates develop.   1:26pm: CSW spoke with patient who reported that she spoke with son the previous night.  Patient reports that she confirmed that she can go and live with son upon discharge.     Cathy Aslin, LCSW, LCAS Clincal Social Worker  [facility]

## 2021-11-27 NOTE — Progress Notes (Signed)
Pt visible on the unit much of the evening, pt stated she felt a little better, pt was encouraged to talk to the doctor about D/C plan    11/27/21 2200  Psych Admission Type (Psych Patients Only)  Admission Status Voluntary  Psychosocial Assessment  Patient Complaints Anxiety  Eye Contact Fair  Facial Expression Anxious;Pained  Affect Anxious;Flat  Speech Logical/coherent  Interaction Attention-seeking;Assertive;Needy  Motor Activity Unsteady;Slow  Appearance/Hygiene Improved;Disheveled  Behavior Characteristics Cooperative;Anxious  Mood Sad;Anxious  Thought Process  Coherency WDL  Content WDL  Delusions None reported or observed  Perception WDL  Hallucination None reported or observed  Judgment WDL  Confusion None  Danger to Self  Current suicidal ideation? Denies  Self-Injurious Behavior No self-injurious ideation or behavior indicators observed or expressed   Agreement Not to Harm Self Yes  Description of Agreement Verbal Contract  Danger to Others  Danger to Others None reported or observed

## 2021-11-27 NOTE — B[facility] Counselor (Signed)
[facility] intake coordinator, Bre, called this CSW back and left message to CSW know that patient was a client of theirs previously.  They report they have no transitional beds opening but may be able to connect her with outpatient SAIOP services.  CSW called back and left message to discuss options of what services patient can get set up with with a request for a call back.    Cimberly Stoffel, LCSW, LCAS Clincal Social Worker  [facility]

## 2021-11-27 NOTE — Group Note (Signed)
LCSW Group Therapy Note   Group Date: 11/27/2021 Start Time: 1300 End Time: 1400    Type of Therapy and Topic:  Group Therapy:  identifying Supports and their Affects  Participation Level:  Did Not Attend   Description of Group:  Patients in this group were introduced to the idea of adding a variety of healthy supports to address the various needs in their lives.Patients discussed what additional healthy supports could be helpful in their recovery and wellness after discharge in order to prevent future hospitalizations.   An emphasis was placed on using counselor, doctor, therapy groups, 12-step groups, and problem-specific support groups to expand supports.  They also worked as a group on developing a specific plan for several patients to deal with unhealthy supports through [place], psychoeducation with loved ones, and even termination of relationships.   Therapeutic Goals:   1)  discuss importance of adding supports to stay well once out of the hospital  2)  complete ecomap  3)  generate ideas and descriptions of healthy supports that can be added  4)  offer mutual support during discussion  5)  encourage active participation in and adherence to discharge plan    Summary of Patient Progress:  Did Not Attend   Therapeutic Modalities:   [place], [place] 11/27/2021  1:35 PM

## 2021-11-27 NOTE — BH IP Treatment Plan (Signed)
Interdisciplinary Treatment and Diagnostic Plan Update  11/27/2021 Time of Session: 9:35am Cathy Hall MRN: [id number]  Principal Diagnosis: MDD (major depressive disorder), recurrent, severe, with psychosis ([place])  Secondary Diagnoses: Principal Problem:   MDD (major depressive disorder), recurrent, severe, with psychosis ([place]) Active Problems:   Cocaine abuse ([place])   Current Medications:  Current Facility-Administered Medications  Medication Dose Route Frequency Provider Last Rate Last Admin   acetaminophen (TYLENOL) tablet 650 mg  650 mg Oral Q6H PRN Rankin, Shuvon B, NP   650 mg at 11/25/21 1552   albuterol (VENTOLIN HFA) 108 (90 Base) MCG/ACT inhaler 1-2 puff  1-2 puff Inhalation Q4H PRN Rankin, Shuvon B, NP   2 puff at 11/27/21 0737   alum & mag hydroxide-simeth (MAALOX/MYLANTA) 200-200-20 MG/5ML suspension 30 mL  30 mL Oral Q4H PRN Rankin, Shuvon B, NP       amLODipine (NORVASC) tablet 10 mg  10 mg Oral Daily Rankin, Shuvon B, NP   10 mg at 11/27/21 0741   aspirin EC tablet 325 mg  325 mg Oral Daily Rankin, Shuvon B, NP   325 mg at 11/27/21 0741   atorvastatin (LIPITOR) tablet 40 mg  40 mg Oral Daily Rankin, Shuvon B, NP   40 mg at 11/27/21 0740   cloNIDine (CATAPRES) tablet 0.1 mg  0.1 mg Oral Q8H PRN Nelda Marseille, Amy E, MD   0.1 mg at 11/24/21 1226   clopidogrel (PLAVIX) tablet 75 mg  75 mg Oral Daily Rankin, Shuvon B, NP   75 mg at 11/27/21 0741   diclofenac Sodium (VOLTAREN) 1 % topical gel 2 g  2 g Topical QID Damita Dunnings B, MD   2 g at 11/26/21 2125   FLUoxetine (PROZAC) capsule 20 mg  20 mg Oral Daily Damita Dunnings B, MD   20 mg at 11/27/21 0740   fluticasone (FLONASE) 50 MCG/ACT nasal spray 1 spray  1 spray Each Nare Daily Damita Dunnings B, MD   1 spray at 11/27/21 0738   gabapentin (NEURONTIN) capsule 400 mg  400 mg Oral TID Damita Dunnings B, MD   400 mg at 11/27/21 0741   hydrOXYzine (ATARAX) tablet 25 mg  25 mg Oral TID PRN Harlow Asa, MD   25 mg at  11/26/21 2124   influenza vac split quadrivalent PF (FLUARIX) injection 0.5 mL  0.5 mL Intramuscular Tomorrow-1000 Nelda Marseille, Amy E, MD       loratadine (CLARITIN) tablet 10 mg  10 mg Oral Daily Nicholes Rough, NP   10 mg at 11/27/21 0740   magnesium hydroxide (MILK OF MAGNESIA) suspension 30 mL  30 mL Oral Daily PRN Rankin, Shuvon B, NP       melatonin tablet 6 mg  6 mg Oral QHS Nkwenti, Doris, NP   6 mg at 11/26/21 2124   metFORMIN (GLUCOPHAGE) tablet 500 mg  500 mg Oral Q breakfast Damita Dunnings B, MD   500 mg at 11/27/21 0740   methocarbamol (ROBAXIN) tablet 500 mg  500 mg Oral Q6H PRN Nicholes Rough, NP   500 mg at 11/26/21 2124   mometasone-formoterol (DULERA) 100-5 MCG/ACT inhaler 2 puff  2 puff Inhalation BID Rankin, Shuvon B, NP   2 puff at 11/27/21 0939   montelukast (SINGULAIR) tablet 10 mg  10 mg Oral QHS Viann Fish E, MD   10 mg at 11/26/21 2124   multivitamin with minerals tablet 1 tablet  1 tablet Oral Daily Rankin, Shuvon B, NP   1 tablet at 11/27/21 0740  nicotine (NICODERM CQ - dosed in mg/24 hours) patch 14 mg  14 mg Transdermal Daily Damita Dunnings B, MD   14 mg at 11/27/21 0743   OLANZapine (ZYPREXA) tablet 7.5 mg  7.5 mg Oral QHS Nkwenti, Doris, NP   7.5 mg at 11/26/21 2124   ondansetron (ZOFRAN-ODT) disintegrating tablet 4 mg  4 mg Oral Q8H PRN Freida Busman, MD       pantoprazole (PROTONIX) EC tablet 40 mg  40 mg Oral Daily Rankin, Shuvon B, NP   40 mg at 11/27/21 0740   traZODone (DESYREL) tablet 50 mg  50 mg Oral QHS Nkwenti, Doris, NP   50 mg at 11/26/21 2124   umeclidinium bromide (INCRUSE ELLIPTA) 62.5 MCG/ACT 1 puff  1 puff Inhalation Daily Harlow Asa, MD   1 puff at 11/27/21 0737   PTA Medications: Medications Prior to Admission  Medication Sig Dispense Refill Last Dose   acetaminophen (TYLENOL) 325 MG tablet TAKE 2 TABLETS (650 MG TOTAL) BY MOUTH EVERY FOUR HOURS AS NEEDED FOR MILD PAIN (OR TEMP > 37.5 C (99.5 F)). (Patient taking differently: Take 650  mg by mouth every 4 (four) hours as needed for mild pain or fever.) 30 tablet 0    albuterol (VENTOLIN HFA) 108 (90 Base) MCG/ACT inhaler INHALE 2 PUFFS INTO THE LUNGS EVERY FOUR HOURS AS NEEDED FOR WHEEZING OR SHORTNESS OF BREATH. (Patient taking differently: Inhale 1-2 puffs into the lungs every 4 (four) hours as needed for wheezing or shortness of breath.) 18 g 0    amLODipine (NORVASC) 10 MG tablet Take 10 mg by mouth daily.      amLODipine (NORVASC) 5 MG tablet TAKE 1 TABLET (5 MG TOTAL) BY MOUTH DAILY. (Patient not taking: Reported on 11/20/2021) 90 tablet 0    aspirin 325 MG EC tablet TAKE 1 TABLET (325 MG TOTAL) BY MOUTH DAILY. (Patient taking differently: Take 325 mg by mouth daily.) 30 tablet 0    aspirin 325 MG tablet Take 1 tablet (325 mg total) by mouth daily. (Patient not taking: Reported on 11/20/2021) 30 tablet 0    atorvastatin (LIPITOR) 40 MG tablet TAKE 1 TABLET (40 MG TOTAL) BY MOUTH DAILY. (Patient taking differently: Take 40 mg by mouth daily.) 90 tablet 0    benzocaine (ORAJEL) 10 % mucosal gel USE AS DIRECTED IN THE MOUTH OR THROAT FOUR TIMES DAILY AS NEEDED FOR MOUTH PAIN. (Patient taking differently: Use as directed 1 application in the mouth or throat 4 (four) times daily as needed for mouth pain.) 9 g 0    Benzocaine-Menthol 15-3.6 MG LOZG TAKE 1 LOZENGE (3 MG TOTAL) BY MOUTH AS NEEDED FOR SORE THROAT. (Patient taking differently: Take 1 lozenge by mouth 4 (four) times daily as needed (sore throat).) 100 lozenge 12    clopidogrel (PLAVIX) 75 MG tablet TAKE 1 TABLET (75 MG TOTAL) BY MOUTH DAILY. (Patient taking differently: Take 75 mg by mouth daily.) 30 tablet 0    diclofenac Sodium (VOLTAREN) 1 % GEL APPLY 1 APPLICATION TOPICALLY FOUR TIMES DAILY. (Patient taking differently: Apply 4 g topically 4 (four) times daily.) 100 g 0    FLUoxetine (PROZAC) 10 MG capsule TAKE 1 CAPSULE (10 MG TOTAL) BY MOUTH DAILY. (Patient taking differently: Take 10 mg by mouth daily.) 30 capsule 0     fluticasone (FLONASE) 50 MCG/ACT nasal spray PLACE 2 SPRAYS INTO BOTH NOSTRILS DAILY. 16 g 0    gabapentin (NEURONTIN) 300 MG capsule TAKE 1 CAPSULE (300 MG TOTAL) BY  MOUTH THREE TIMES DAILY. (Patient taking differently: Take 300 mg by mouth 3 (three) times daily.) 90 capsule 0    Glycerin-Hypromellose-PEG 400 0.2-0.2-1 % SOLN PLACE 1 DROP INTO BOTH EYES DAILY AS NEEDED FOR DRY EYES. (Patient taking differently: Place 1 drop into both eyes 2 (two) times daily as needed (dry eyes).) 15 mL 0    hydrOXYzine (ATARAX/VISTARIL) 25 MG tablet TAKE 1 TABLET (25 MG TOTAL) BY MOUTH THREE TIMES DAILY AS NEEDED FOR ANXIETY. (Patient taking differently: Take 25 mg by mouth 3 (three) times daily.) 90 tablet 0    loratadine (CLARITIN) 10 MG tablet TAKE 1 TABLET (10 MG TOTAL) BY MOUTH DAILY. (Patient taking differently: Take 10 mg by mouth daily.) 30 tablet 0    Maltodextrin-Xanthan Gum (RESOURCE THICKENUP CLEAR) POWD TAKE BY MOUTH AS DIRECTED (Patient not taking: Reported on 11/20/2021) 125 g 0    menthol-cetylpyridinium (CEPACOL) 3 MG lozenge Take 1 lozenge (3 mg total) by mouth as needed for sore throat. (Patient not taking: Reported on 11/20/2021) 100 tablet 12    mometasone-formoterol (DULERA) 100-5 MCG/ACT AERO INHALE 2 PUFFS INTO THE LUNGS TWO TIMES DAILY. 13 g 0    montelukast (SINGULAIR) 10 MG tablet TAKE 1 TABLET (10 MG TOTAL) BY MOUTH AT BEDTIME. (Patient taking differently: Take 10 mg by mouth at bedtime.) 30 tablet 0    Multiple Vitamin (MULTIVITAMIN WITH MINERALS) TABS tablet Take 1 tablet by mouth daily. 30 tablet 0    nystatin (MYCOSTATIN) 100000 UNIT/ML suspension TAKE 5 MLS (500,000 UNITS TOTAL) BY MOUTH FOUR TIMES DAILY. (Patient taking differently: Use as directed 5 mLs in the mouth or throat 4 (four) times daily.) 60 mL 0    OLANZapine (ZYPREXA) 5 MG tablet TAKE 1 TABLET (5 MG TOTAL) BY MOUTH AT BEDTIME. (Patient taking differently: Take 5 mg by mouth at bedtime.) 30 tablet 0    pantoprazole (PROTONIX)  40 MG tablet TAKE 1 TABLET (40 MG TOTAL) BY MOUTH DAILY. (Patient taking differently: Take 40 mg by mouth daily.) 90 tablet 0    polyvinyl alcohol (LIQUIFILM TEARS) 1.4 % ophthalmic solution Place 1 drop into both eyes daily as needed for dry eyes. 15 mL 0    sodium chloride (OCEAN) 0.65 % nasal spray PLACE 1 SPRAY INTO BOTH NOSTRILS AS NEEDED FOR CONGESTION. (Patient taking differently: Place 1 spray into the nose daily as needed for congestion.) 88 mL 0    sodium chloride (OCEAN) 0.65 % SOLN nasal spray Place 1 spray into both nostrils as needed for congestion. (Patient not taking: Reported on 11/20/2021) 88 mL 0    umeclidinium bromide (INCRUSE ELLIPTA) 62.5 MCG/INH AEPB INHALE 1 PUFF INTO THE LUNGS DAILY. (Patient taking differently: Inhale 1 puff into the lungs daily.) 30 each 0     Patient Stressors: Financial difficulties   Health problems   Medication change or noncompliance   Substance abuse   Traumatic event    Patient Strengths: Ability for insight  Average or above average intelligence  Motivation for treatment/growth  Supportive family/friends   Treatment Modalities: Medication Management, Group therapy, Case management,  1 to 1 session with clinician, Psychoeducation, Recreational therapy.   Physician Treatment Plan for Primary Diagnosis: MDD (major depressive disorder), recurrent, severe, with psychosis ([place]) Long Term Goal(s): Improvement in symptoms so as ready for discharge   Short Term Goals: Ability to identify changes in lifestyle to reduce recurrence of condition will improve Ability to verbalize feelings will improve Ability to disclose and discuss suicidal ideas Ability to  demonstrate self-control will improve Ability to identify and develop effective coping behaviors will improve Ability to maintain clinical measurements within normal limits will improve Compliance with prescribed medications will improve Ability to identify triggers associated with substance  abuse/mental health issues will improve  Medication Management: Evaluate patient's response, side effects, and tolerance of medication regimen.  Therapeutic Interventions: 1 to 1 sessions, Unit Group sessions and Medication administration.  Evaluation of Outcomes: Progressing  Physician Treatment Plan for Secondary Diagnosis: Principal Problem:   MDD (major depressive disorder), recurrent, severe, with psychosis ([place]) Active Problems:   Cocaine abuse ([place])  Long Term Goal(s): Improvement in symptoms so as ready for discharge   Short Term Goals: Ability to identify changes in lifestyle to reduce recurrence of condition will improve Ability to verbalize feelings will improve Ability to disclose and discuss suicidal ideas Ability to demonstrate self-control will improve Ability to identify and develop effective coping behaviors will improve Ability to maintain clinical measurements within normal limits will improve Compliance with prescribed medications will improve Ability to identify triggers associated with substance abuse/mental health issues will improve     Medication Management: Evaluate patient's response, side effects, and tolerance of medication regimen.  Therapeutic Interventions: 1 to 1 sessions, Unit Group sessions and Medication administration.  Evaluation of Outcomes: Progressing   RN Treatment Plan for Primary Diagnosis: MDD (major depressive disorder), recurrent, severe, with psychosis ([place]) Long Term Goal(s): Knowledge of disease and therapeutic regimen to maintain health will improve  Short Term Goals: Ability to remain free from injury will improve, Ability to verbalize frustration and anger appropriately will improve, Ability to identify and develop effective coping behaviors will improve, and Compliance with prescribed medications will improve  Medication Management: RN will administer medications as ordered by provider, will assess and evaluate patient's  response and provide education to patient for prescribed medication. RN will report any adverse and/or side effects to prescribing provider.  Therapeutic Interventions: 1 on 1 counseling sessions, Psychoeducation, Medication administration, Evaluate responses to treatment, Monitor vital signs and CBGs as ordered, Perform/monitor CIWA, COWS, AIMS and Fall Risk screenings as ordered, Perform wound care treatments as ordered.  Evaluation of Outcomes: Progressing   LCSW Treatment Plan for Primary Diagnosis: MDD (major depressive disorder), recurrent, severe, with psychosis ([place]) Long Term Goal(s): Safe transition to appropriate next level of care at discharge, Engage patient in therapeutic group addressing interpersonal concerns.  Short Term Goals: Engage patient in aftercare planning with referrals and resources, Increase social support, Increase ability to appropriately verbalize feelings, Identify triggers associated with mental health/substance abuse issues, and Increase skills for wellness and recovery  Therapeutic Interventions: Assess for all discharge needs, 1 to 1 time with Social worker, Explore available resources and support systems, Assess for adequacy in community support network, Educate family and significant other(s) on suicide prevention, Complete Psychosocial Assessment, Interpersonal group therapy.  Evaluation of Outcomes: Progressing   PProgress in Treatment: Attending groups: Yes. Participating in groups: Yes. Taking medication as prescribed: Yes. Toleration medication: Yes. Family/Significant other contact made: Yes, individual(s) contacted:  Daughter-In-Law Patient understands diagnosis: Yes. Discussing patient identified problems/goals with staff: Yes. Medical problems stabilized or resolved: Yes. Denies suicidal/homicidal ideation: Yes. Issues/concerns per patient self-inventory: No.   New problem(s) identified: No, Describe:  None    New Short Term/Long Term  Goal(s): medication stabilization, elimination of SI thoughts, development of comprehensive mental wellness plan.    Patient Goals: "To work on my depression and to feel better"    Discharge Plan or Barriers: Patient  is to live with brother at discharge. Pt is to follow up at [facility]   Reason for Continuation of Hospitalization: Depression Medical Issues Medication stabilization    Estimated Length of Stay: 3 to 5 days    Scribe for Treatment Team: Vassie Moselle, LCSW 11/27/2021 11:00 AM

## 2021-11-27 NOTE — Plan of Care (Addendum)
Per nursing, patient wants to have her Vistaril back and understands she cannot mix it with magic mouthwash since benadryl is in the magic mouthwash. She tells nurses she would prefer her Vistaril for anxiety and will wash mouth with water after using inhalers rather than using magic mouthwash at this time for tongue pain.  Bartholomew Crews, MD, FAPA  Patient later found me while at dinner and requested to stop Vistaril and try mouthwash. Orders were updated to reflect change.  Bartholomew Crews, MD, [facility]ene Skeen

## 2021-11-27 NOTE — Progress Notes (Signed)
The patient attended the evening A.A.meeting and was appropriate.  

## 2021-11-27 NOTE — Progress Notes (Addendum)
[facility] MD Progress Note  11/27/2021 3:13 PM Cathy Hall  MRN:  [id number] Subjective:  Cathy Hall is a 51 y.o. female with history of depression, cocaine abuse, hypertension, COPD, and stroke with left-sided weakness.  Patient presented voluntarily to [facility] with complaint of worsening depression, +SI with a plan to overdose on medications. BP at the [facility] was 222/134 and pt was symptomatic with dizziness. She reported not taking her antihypertensives x 2 weeks. She was transferred to [facility] ED, was medically stabilized, and transferred to [facility] on 11/22/2019 for treatment and stabilization of her mood.  Case was discussed in the multidisciplinary team. MAR was reviewed and patient was compliant with medications.  She did not require any PRN's for agitation.     Psychiatric Team made the following recommendations yesterday:  - Continue Prozac 20mg  for depression/anxiety - Continue home Vistaril 25mg  TID PRN anxiety - Continue gabapentin 400mg  TID to help with anxiety and residual pain issues - Increase home medication Zyprexa 7.5 mg QHS for psychosis - Shower assistance and prompts to attend to ADLs  Subjectively, patient reports that she is feeling "a little anxious."  Patient reports that she is not quite sure why she is anxious but she is doing her best to stay out of her bed and out of her room.  Patient endorses that she is able to get up and dress herself this morning.  Patient reports that she is sleeping better, but reports it is still difficult for her to fall asleep.  Patient reports that she does think the medication is helping her sleep through the night.  Patient reports that she is eating well and has no cravings for illicit substances.  Patient reports that she does feel that her depression has subsided some as she is less isolative and attending more group therapy.  Patient endorses that she has also been reaching out to family and has successfully retrieved  her son's number in an effort to begin planning for her dispo.  Otherwise patient is denying SI HI and AVH. She denies delusions, ideas of reference, or first rank symptoms. Patient does not endorse any significant adverse side effects from her medication. She denies physical complaints other than intermittent superficial tongue pain. She denies trouble swallowing, patches in mouth, or oral movements. She states she is now swishing her mouth after using her inhalers.   Principal Problem: MDD (major depressive disorder), recurrent, severe, with psychosis ([place]) Diagnosis: Principal Problem:   MDD (major depressive disorder), recurrent, severe, with psychosis ([place]) Active Problems:   Cocaine abuse ([place])  Total Time Spent in Direct Patient Care:  I personally spent 25 minutes on the unit in direct patient care. The direct patient care time included face-to-face time with the patient, reviewing the patient's chart, communicating with other professionals, and coordinating care. Greater than 50% of this time was spent in counseling or coordinating care with the patient regarding goals of hospitalization, psycho-education, and discharge planning needs.   Past Psychiatric History: See H&P  Past Medical History:  Past Medical History:  Diagnosis Date   Anxiety    Asthma    COPD (chronic obstructive pulmonary disease) ([place])    Depression    Hypertension    Major depression    PTSD (post-traumatic stress disorder)     Past Surgical History:  Procedure Laterality Date   ECTOPIC PREGNANCY SURGERY     Family History:  Family History  Problem Relation Age of Onset   Cerebral aneurysm Mother  Diabetes Sister    Other Neg Hx    Family Psychiatric  History: See H&P Social History:  Social History   Substance and Sexual Activity  Alcohol Use Yes   Comment: occasional     Social History   Substance and Sexual Activity  Drug Use Yes   Types: Cocaine, Marijuana   Comment: last cocaine use  3 days ago    Social History   Socioeconomic History   Marital status: Significant Other    Spouse name: Not on file   Number of children: Not on file   Years of education: Not on file   Highest education level: Not on file  Occupational History   Not on file  Tobacco Use   Smoking status: Every Day    Packs/day: 1.00    Types: Cigarettes   Smokeless tobacco: Never  Vaping Use   Vaping Use: Never used  Substance and Sexual Activity   Alcohol use: Yes    Comment: occasional   Drug use: Yes    Types: Cocaine, Marijuana    Comment: last cocaine use 3 days ago   Sexual activity: Not Currently  Other Topics Concern   Not on file  Social History Narrative   ** Merged History Encounter **       Social Determinants of Health   Financial Resource Strain: Not on file  Food Insecurity: Not on file  Transportation Needs: Not on file  Physical Activity: Not on file  Stress: Not on file  Social Connections: Not on file   Sleep: Fair  Appetite:  Good  Current Medications: Current Facility-Administered Medications  Medication Dose Route Frequency Provider Last Rate Last Admin   acetaminophen (TYLENOL) tablet 650 mg  650 mg Oral Q6H PRN Rankin, Shuvon B, NP   650 mg at 11/25/21 1552   albuterol (VENTOLIN HFA) 108 (90 Base) MCG/ACT inhaler 1-2 puff  1-2 puff Inhalation Q4H PRN Rankin, Shuvon B, NP   2 puff at 11/27/21 0737   alum & mag hydroxide-simeth (MAALOX/MYLANTA) 200-200-20 MG/5ML suspension 30 mL  30 mL Oral Q4H PRN Rankin, Shuvon B, NP       amLODipine (NORVASC) tablet 10 mg  10 mg Oral Daily Rankin, Shuvon B, NP   10 mg at 11/27/21 0741   aspirin EC tablet 325 mg  325 mg Oral Daily Rankin, Shuvon B, NP   325 mg at 11/27/21 0741   atorvastatin (LIPITOR) tablet 40 mg  40 mg Oral Daily Rankin, Shuvon B, NP   40 mg at 11/27/21 0740   cloNIDine (CATAPRES) tablet 0.1 mg  0.1 mg Oral Q8H PRN Nelda Marseille, Jamen Loiseau E, MD   0.1 mg at 11/24/21 1226   clopidogrel (PLAVIX) tablet 75 mg  75  mg Oral Daily Rankin, Shuvon B, NP   75 mg at 11/27/21 0741   diclofenac Sodium (VOLTAREN) 1 % topical gel 2 g  2 g Topical QID Damita Dunnings B, MD   2 g at 11/27/21 1220   FLUoxetine (PROZAC) capsule 20 mg  20 mg Oral Daily Damita Dunnings B, MD   20 mg at 11/27/21 0740   fluticasone (FLONASE) 50 MCG/ACT nasal spray 1 spray  1 spray Each Nare Daily Damita Dunnings B, MD   1 spray at 11/27/21 0738   gabapentin (NEURONTIN) capsule 400 mg  400 mg Oral TID Damita Dunnings B, MD   400 mg at 11/27/21 1219   hydrOXYzine (ATARAX) tablet 25 mg  25 mg Oral TID PRN Harlow Asa,  MD   25 mg at 11/26/21 2124   influenza vac split quadrivalent PF (FLUARIX) injection 0.5 mL  0.5 mL Intramuscular Tomorrow-1000 Mason Jim, Dawson Albers E, MD       loratadine (CLARITIN) tablet 10 mg  10 mg Oral Daily Starleen Blue, NP   10 mg at 11/27/21 0740   magnesium hydroxide (MILK OF MAGNESIA) suspension 30 mL  30 mL Oral Daily PRN Rankin, Shuvon B, NP       melatonin tablet 6 mg  6 mg Oral QHS Nkwenti, Doris, NP   6 mg at 11/26/21 2124   metFORMIN (GLUCOPHAGE) tablet 500 mg  500 mg Oral Q breakfast Eliseo Gum B, MD   500 mg at 11/27/21 0740   methocarbamol (ROBAXIN) tablet 500 mg  500 mg Oral Q6H PRN Starleen Blue, NP   500 mg at 11/26/21 2124   mometasone-formoterol (DULERA) 100-5 MCG/ACT inhaler 2 puff  2 puff Inhalation BID Rankin, Shuvon B, NP   2 puff at 11/27/21 0939   montelukast (SINGULAIR) tablet 10 mg  10 mg Oral QHS Mason Jim, Malita Ignasiak E, MD   10 mg at 11/26/21 2124   multivitamin with minerals tablet 1 tablet  1 tablet Oral Daily Rankin, Shuvon B, NP   1 tablet at 11/27/21 0740   nicotine (NICODERM CQ - dosed in mg/24 hours) patch 14 mg  14 mg Transdermal Daily Eliseo Gum B, MD   14 mg at 11/27/21 0743   OLANZapine (ZYPREXA) tablet 7.5 mg  7.5 mg Oral QHS Nkwenti, Doris, NP   7.5 mg at 11/26/21 2124   ondansetron (ZOFRAN-ODT) disintegrating tablet 4 mg  4 mg Oral Q8H PRN Eliseo Gum B, MD       pantoprazole (PROTONIX)  EC tablet 40 mg  40 mg Oral Daily Rankin, Shuvon B, NP   40 mg at 11/27/21 0740   traZODone (DESYREL) tablet 50 mg  50 mg Oral QHS Nkwenti, Doris, NP   50 mg at 11/26/21 2124   umeclidinium bromide (INCRUSE ELLIPTA) 62.5 MCG/ACT 1 puff  1 puff Inhalation Daily Comer Locket, MD   1 puff at 11/27/21 7116    Lab Results:  Results for orders placed or performed during the hospital encounter of 11/21/21 (from the past 48 hour(s))  Glucose, capillary     Status: Abnormal   Collection Time: 11/25/21  8:22 PM  Result Value Ref Range   Glucose-Capillary 167 (H) 70 - 99 mg/dL    Comment: Glucose reference range applies only to samples taken after fasting for at least 8 hours.   Comment 1 Notify RN    Comment 2 Document in Chart   Glucose, capillary     Status: Abnormal   Collection Time: 11/26/21  6:33 AM  Result Value Ref Range   Glucose-Capillary 147 (H) 70 - 99 mg/dL    Comment: Glucose reference range applies only to samples taken after fasting for at least 8 hours.  Resp Panel by RT-PCR (Flu A&B, Covid) Nasopharyngeal Swab     Status: None   Collection Time: 11/26/21  3:39 PM   Specimen: Nasopharyngeal Swab; Nasopharyngeal(NP) swabs in vial transport medium  Result Value Ref Range   SARS Coronavirus 2 by RT PCR NEGATIVE NEGATIVE    Comment: (NOTE) SARS-CoV-2 target nucleic acids are NOT DETECTED.  The SARS-CoV-2 RNA is generally detectable in upper respiratory specimens during the acute phase of infection. The lowest concentration of SARS-CoV-2 viral copies this assay can detect is 138 copies/mL. A negative result does not  preclude SARS-Cov-2 infection and should not be used as the sole basis for treatment or other patient management decisions. A negative result may occur with  improper specimen collection/handling, submission of specimen other than nasopharyngeal swab, presence of viral mutation(s) within the areas targeted by this assay, and inadequate number of viral copies(<138  copies/mL). A negative result must be combined with clinical observations, patient history, and epidemiological information. The expected result is Negative.  Fact Sheet for Patients:  EntrepreneurPulse.com.au  Fact Sheet for Healthcare Providers:  IncredibleEmployment.be  This test is no t yet approved or cleared by the [place] FDA and  has been authorized for detection and/or diagnosis of SARS-CoV-2 by FDA under an Emergency Use Authorization (EUA). This EUA will remain  in effect (meaning this test can be used) for the duration of the COVID-19 declaration under Section 564(b)(1) of the Act, 21 U.S.C.section 360bbb-3(b)(1), unless the authorization is terminated  or revoked sooner.       Influenza A by PCR NEGATIVE NEGATIVE   Influenza B by PCR NEGATIVE NEGATIVE    Comment: (NOTE) The Xpert Xpress SARS-CoV-2/FLU/RSV plus assay is intended as an aid in the diagnosis of influenza from Nasopharyngeal swab specimens and should not be used as a sole basis for treatment. Nasal washings and aspirates are unacceptable for Xpert Xpress SARS-CoV-2/FLU/RSV testing.  Fact Sheet for Patients: EntrepreneurPulse.com.au  Fact Sheet for Healthcare Providers: IncredibleEmployment.be  This test is not yet approved or cleared by the [place] FDA and has been authorized for detection and/or diagnosis of SARS-CoV-2 by FDA under an Emergency Use Authorization (EUA). This EUA will remain in effect (meaning this test can be used) for the duration of the COVID-19 declaration under Section 564(b)(1) of the Act, 21 U.S.C. section 360bbb-3(b)(1), unless the authorization is terminated or revoked.  Performed at [facility], [place] [address], [place], [place] [address]   Glucose, capillary     Status: Abnormal   Collection Time: 11/26/21  9:21 PM  Result Value Ref Range   Glucose-Capillary 211 (H)  70 - 99 mg/dL    Comment: Glucose reference range applies only to samples taken after fasting for at least 8 hours.   Comment 1 Notify RN   Glucose, capillary     Status: Abnormal   Collection Time: 11/27/21  5:10 AM  Result Value Ref Range   Glucose-Capillary 136 (H) 70 - 99 mg/dL    Comment: Glucose reference range applies only to samples taken after fasting for at least 8 hours.   Comment 1 Notify RN     Blood Alcohol level:  Lab Results  Component Value Date   ETH <10 11/20/2021   ETH <10 0000000    Metabolic Disorder Labs: Lab Results  Component Value Date   HGBA1C 6.5 (H) 11/22/2021   MPG 139.85 11/22/2021   MPG 139.85 11/22/2021   No results found for: PROLACTIN Lab Results  Component Value Date   CHOL 169 11/22/2021   TRIG 264 (H) 11/22/2021   HDL 42 11/22/2021   CHOLHDL 4.0 11/22/2021   VLDL 53 (H) 11/22/2021   LDLCALC 74 11/22/2021   LDLCALC 65 11/22/2021    Physical Findings: AIMS: Facial and Oral Movements Muscles of Facial Expression: None, normal Lips and Perioral Area: None, normal Jaw: None, normal Tongue: None, normal,Extremity Movements Upper (arms, wrists, hands, fingers): None, normal Lower (legs, knees, ankles, toes): None, normal, Trunk Movements Neck, shoulders, hips: None, normal, Overall Severity Severity of abnormal movements (highest score from questions  above): None, normal Incapacitation due to abnormal movements: None, normal Patient's awareness of abnormal movements (rate only patient's report): No Awareness, Dental Status Current problems with teeth and/or dentures?: No Does patient usually wear dentures?: No   Musculoskeletal: Strength & Muscle Tone: decreased Gait & Station:  in wheelchair Patient leans: N/A   Psychiatric Specialty Exam:   Presentation  General Appearance: -- Obese, appears to have groomed today appropriately dressed   Eye Contact:Fair   Speech:clear and coherent, normal rate   Speech Volume:  Normal   Mood and Affect  Mood: "A little anxious"   Affect: Congruent    Thought Process  Thought Processes:Coherent, goal directed   Descriptions of Associations:Intact   Orientation:Full (Time, Place and Person)   Thought Content: Denies SI, HI, AVH, paranoia, delusions    Suicidal Thoughts:Denies   Homicidal Thoughts:Denies    Sensorium  Memory:Immediate Fair; Recent Fair; Remote Fair   Judgment:-- Fair    Insight:Shallow    Executive Functions  Concentration:Fair   Attention Span:Fair   [place]    Psychomotor Activity  Psychomotor Activity:Using wheelchair - no tremors or restlessness noted    Assets  Assets:Resilience    Sleep  Total time unrecorded   Physical Exam Vitals reviewed.  Constitutional:      Appearance: She is well-developed.  HENT:     Head: Normocephalic and atraumatic.     Comments: No patches or yeast on cheeks or tongue, no redness or tongue swelling and no tongue lesions noted; no tongue tremor or involuntary tongue or mouth movements noted Skin:    General: Skin is dry.  Neurological:     Mental Status: She is alert.   Review of Systems  HENT:         Top of tongue sore  Cardiovascular:  Negative for chest pain.  Gastrointestinal:  Negative for constipation, diarrhea, nausea and vomiting.  Genitourinary:  Negative for dysuria, frequency and urgency.  Psychiatric/Behavioral:  Negative for hallucinations and suicidal ideas.   Blood pressure (!) 140/95, pulse (!) 103, temperature 97.7 F (36.5 C), temperature source Oral, resp. rate 20, height 5\' 1"  (1.549 m), weight 114.2 kg, SpO2 100 %. Body mass index is 47.58 kg/m.   Treatment Plan Summary: Daily contact with patient to assess and evaluate symptoms and progress in treatment and Medication management  Cathy Hall is a 51 yo patient w. PPH of MDD and PTSD who appears to present with a current episode of MDD in the  context of cocaine and THC use and recent anniversaries of losses.  Patient appears to be responding well to medication adjustments.  Patient is less isolative and is willing around on the unit.  Patient is beginning to attend groups and endorses feeling that she is benefiting from group therapy.  Patient has also shown more motivation regarding getting her life to where she would like for her to be.  MDD, recurrent, severe w/ psychotic features ( R/O SIMD vs SIPD) Hx of PTSD - Continue Prozac 20mg  daily - Hold PRN Vistaril with start of magic mouthwash for tongue pain which has benadryl in it - Continue gabapentin 400mg  TID to help with anxiety and residual pain issues - Continue Zyprexa 7.5mg  QHS for psychosis - Shower assistance and prompts to tend to ADLs   Urine Culture (multiple species present - recollection suggested) - Repeat urine culture ordered when she will give specimen - presently asymptomatic  Elevated A1c With patient's elevated A1c,  current HLD, and obesity in conjunction with Zyprexa use we will start patient on Metformin. -Continue metformin 500mg  daily- Daily CBG's - Will need PCP f/u after discharge   Stimulant use disorder, severe, cocaine Cannabis use disorder - counseled on need to abstain from use - will discuss outpatient SA treatment options with SW - Monitoring for signs of withdrawal   CVA history:  - continue home Plavix 75mg , hx of CVA - Continue home ASA, 81 mg, hx of CVA   HLD -- Continue home Lipitor 40mg    Tobacco use disorder -  Decrease to 14mg  patch due to reported Tobacco use of 0.5ppd   GERD - Protonix 40mg  daily   COPD - Continue home Dulera 2 puffs bid - Continue home Incruse Ellipta 1 puff daily - Continue home Albuterol PRN - Continue home Singulair 10mg  daily    HTN - Norvasc 10mg  daily - Clonidine 0.1mg  q8 hours PRN SBP>160 or DBP>100; hold for HR <60   CP - resolved - Sent to ED with negative Troponin and CXR shows  atelectasis; EKG unchanged compared to previous tracing - per ED no further w/u indicated at this time     Mobility issues - PT consult ordered; continue wheelchair and walker  - Leg rest ordered for wheelchair , for day time use ONLY - Voltaren gel and Robaxin 500mg  TID PRN for pain - SW request for PCP to address knee issues  Tongue Pain - Magic mouthwash tid PRN ordered - advised to swish mouth after use of inhaled steroid and will monitor   PGY-2 Freida Busman, MD 11/27/2021, 3:13 PM

## 2021-11-27 NOTE — Progress Notes (Signed)
Patient has been up in the dayroom after showering. She asked that staff assist her while taking a bath and writer encouraged her to use her shower chair which she did. Writer praised her for taking care of her hygiene needs. She attended group and remained in the dayroom until shortly before it closed. Support given and safety maintained with 15 min checks.

## 2021-11-27 NOTE — BHH Counselor (Addendum)
CSW received a call from from [place], Counsellor, from [place].  She requested additional PT notes regarding if patient can get an appointment for admit into program.  CSW sent information.  If nurse accepts her for appt., patient will need to be able to bring proof of residency (ID with [facility] proof) and blood pressure will need to be controlled.  Patient will also need to be completely independent without assistance with mobility.    They also are requesting a 30 day suppoly of medications with 1 month refill.  They will also need all her inhalers filled prior to seeking admission.   Marcelino Duster agreed to call CSW back for update on admission status.    Anibal Quinby, LCSW, LCAS Clincal Social Worker  [facility]

## 2021-11-27 NOTE — Progress Notes (Signed)
Pt denies SI/HI/AVH and verbally agrees to approach staff if these become apparent or before harming themselves/others. Rates depression 5/10. Rates anxiety 9/10. Rates pain 9/10. Pt is still needy and is changing her mind frequently on things that she wants. Pt has asked to have her CBG checked but five minutes prior asked if she could have cookies. Pt is asking for food a lot during the day. Scheduled medications administered to pt, per MD orders. RN provided support and encouragement to pt. Q15 min safety checks implemented and continued. Pt safe on the unit. RN will continue to monitor and intervene as needed.   11/27/21 0741  Psych Admission Type (Psych Patients Only)  Admission Status Voluntary  Psychosocial Assessment  Patient Complaints Anxiety;Other (Comment) (pain)  Eye Contact Fair  Facial Expression Anxious;Pained  Affect Anxious;Flat  Speech Logical/coherent  Interaction Attention-seeking;Assertive;Needy  Motor Activity Unsteady;Slow  Appearance/Hygiene Improved;Disheveled  Behavior Characteristics Anxious;Cooperative  Mood Anxious;Sad  Thought Process  Coherency WDL  Content WDL  Delusions None reported or observed  Perception WDL  Hallucination None reported or observed  Judgment WDL  Confusion None  Danger to Self  Current suicidal ideation? Denies  Self-Injurious Behavior No self-injurious ideation or behavior indicators observed or expressed   Agreement Not to Harm Self Yes  Description of Agreement Verbal Contract  Danger to Others  Danger to Others None reported or observed

## 2021-11-28 DIAGNOSIS — R0789 Other chest pain: Secondary | ICD-10-CM | POA: Insufficient documentation

## 2021-11-28 DIAGNOSIS — R262 Difficulty in walking, not elsewhere classified: Secondary | ICD-10-CM | POA: Diagnosis present

## 2021-11-28 LAB — GLUCOSE, CAPILLARY
Glucose-Capillary: 148 mg/dL — ABNORMAL HIGH (ref 70–99)
Glucose-Capillary: 179 mg/dL — ABNORMAL HIGH (ref 70–99)

## 2021-11-28 NOTE — Progress Notes (Signed)
The patient rated her day as a 8 out of10 since she had a good talk with her son and daughter-in-law. She states that she is nervous about her upcoming discharge. Her goal for tomorrow is to prepare herself to go home.

## 2021-11-28 NOTE — Progress Notes (Signed)
°   11/28/21 0500  Sleep  Number of Hours 6.75

## 2021-11-28 NOTE — Progress Notes (Signed)
Progress note  Pt found in bed; compliant with medication administration. Pt still has complaints of knee pain. Pt continues to use their wheelchair. Pt pt has been seen in bed during groups. Pt continues to have worries about discharge planning and substance use/abuse. Pt is pleasant. Pt denies si/hi/ah/vh and verbally agrees to approach staff if these become apparent or before harming themselves/others while at [facility].  A: Pt provided support and encouragement. Pt given medication per protocol and standing orders. Q33m safety checks implemented and continued.  R: Pt safe on the unit. Will continue to monitor.

## 2021-11-28 NOTE — Group Note (Signed)
Date:  11/28/2021 Time:  9:53 AM  Group Topic/Focus:  Goals Group:   The focus of this group is to help patients establish daily goals to achieve during treatment and discuss how the patient can incorporate goal setting into their daily lives to aide in recovery.    Participation Level:  Did Not Attend  Margaret Pyle 11/28/2021, 9:53 AM
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
## 2021-11-28 NOTE — Group Note (Signed)
Recreation Therapy Group Note   Group Topic:Animal Assisted Therapy   Group Date: 11/28/2021 Start Time: 1430 End Time: 1515 Facilitators: Caroll Rancher, LRT,CTRS Location: [address]   AAA/T Program Assumption of Risk Form signed by Patient/ or Parent Legal Guardian YES  Patient understands their participation is voluntary YES  Group Description: Patients provided opportunity to interact with trained and credentialed [employer] dog and the community volunteer/dog handler. Patients practiced appropriate animal interaction and were educated on dog safety outside of the hospital in common community settings. Patients were allowed to use dog toys and other items to practice commands, engage the dog in play, and/or complete routine aspects of animal care.    Affect/Mood: N/A   Participation Level: Did not attend    Clinical Observations/Individualized Feedback:     Plan: Continue to engage patient in RT group sessions 2-3x/week.   Caroll Rancher, Antonietta Jewel 11/28/2021 3:45 PM

## 2021-11-28 NOTE — Plan of Care (Signed)
  Problem: Education: Goal: Ability to state activities that reduce stress will improve Outcome: Progressing   Problem: Coping: Goal: Ability to identify and develop effective coping behavior will improve Outcome: Progressing   Problem: Self-Concept: Goal: Ability to identify factors that promote anxiety will improve Outcome: Progressing   

## 2021-11-28 NOTE — Progress Notes (Signed)
[facility] MD Progress Note  11/28/2021 3:33 PM Boni Downie  MRN:  [id number]  Subjective: Khayla reports: "I don't feel so good. I'm congested. No matter what I do, you' all complain. I've been through a lot. I lost my son and my sister".  Daily Notes 11/28/2021: Pt with flat affect with irritable & depressed mood. Attention to personal hygiene and grooming is poor, eye contact is fair, speech is clear & coherent. Thought contents are organized and logical, and pt currently denies SI/HI/AVH. Pt endorses paranoia, states that every one on the unit is out to get her. Pt also became irritable when this NP attempted to positively reinforce her to get out of bed and attend group activities. Pt with very poor motivation to do so, and rambled about losing her son and sister. Empathy was provided, and pt was educated that she is expected to get out of bed and attend group sessions so that she can learn coping mechanisms for her depression & anxiety. Assistance was offered to pt to get out of bed but she declined.   Pt reports a fair appetite, and reports a poor sleep quality last night.  As per nursing documentation however, she slept a total of 6.75 hours last night. Pt has been educated that Trazodone dose will remain at 50 mg nightly PRN due to concerns of oversedation since she has a history of sleep apnea. Pt is in a hospital type bed, and nursing is keeping the head of the bed elevated at between 20 to 30 degree angle when she is sleeping. Pt had mentioned that a CPAP machine was recommended for her in the past, but she does not know the settings, and does not know who recommended it. Pt was educated that a CPAP machine cannot be provided for her here at [facility] since we do not have the settings. She is agreeable to completing another sleep study after discharge. Pt reports that she is tolerating her medications well, and denies having any side effects. Will keep patient at current medications with no  changes. V/S are WNL. Discharge planning is ongoing by pt's assigned Education officer, museum.  Reason for Admission: Crystalee Neuzil Nile Riggs is a 51 y.o. female with history of depression, cocaine abuse, hypertension, COPD, and stroke with left-sided weakness.  Patient presented voluntarily to [facility] with complaint of worsening depression, +SI with a plan to overdose on medications. BP at the [facility] was 222/134 and pt was symptomatic with dizziness. She reported not taking her antihypertensives x 2 weeks. She was transferred to [facility] ED, was medically stabilized, and transferred to [facility] on 11/22/2019 for treatment and stabilization of her mood.  Principal Problem: MDD (major depressive disorder), recurrent, severe, with psychosis ([place]) Diagnosis: Principal Problem:   MDD (major depressive disorder), recurrent, severe, with psychosis ([place]) Active Problems:   Cocaine abuse ([place])  Total Time Spent in Direct Patient Care:  I personally spent 25 minutes on the unit in direct patient care. The direct patient care time included face-to-face time with the patient, reviewing the patient's chart, communicating with other professionals, and coordinating care. Greater than 50% of this time was spent in counseling or coordinating care with the patient regarding goals of hospitalization, psycho-education, and discharge planning needs.   Past Psychiatric History: See H&P  Past Medical History:  Past Medical History:  Diagnosis Date   Anxiety    Asthma    COPD (chronic obstructive pulmonary disease) ([place])    Depression    Hypertension  Major depression    PTSD (post-traumatic stress disorder)     Past Surgical History:  Procedure Laterality Date   ECTOPIC PREGNANCY SURGERY     Family History:  Family History  Problem Relation Age of Onset   Cerebral aneurysm Mother    Diabetes Sister    Other Neg Hx    Family Psychiatric  History: See H&P Social History:  Social History   Substance and Sexual  Activity  Alcohol Use Yes   Comment: occasional     Social History   Substance and Sexual Activity  Drug Use Yes   Types: Cocaine, Marijuana   Comment: last cocaine use 3 days ago    Social History   Socioeconomic History   Marital status: Significant Other    Spouse name: Not on file   Number of children: Not on file   Years of education: Not on file   Highest education level: Not on file  Occupational History   Not on file  Tobacco Use   Smoking status: Every Day    Packs/day: 1.00    Types: Cigarettes   Smokeless tobacco: Never  Vaping Use   Vaping Use: Never used  Substance and Sexual Activity   Alcohol use: Yes    Comment: occasional   Drug use: Yes    Types: Cocaine, Marijuana    Comment: last cocaine use 3 days ago   Sexual activity: Not Currently  Other Topics Concern   Not on file  Social History Narrative   ** Merged History Encounter **       Social Determinants of Health   Financial Resource Strain: Not on file  Food Insecurity: Not on file  Transportation Needs: Not on file  Physical Activity: Not on file  Stress: Not on file  Social Connections: Not on file   Sleep: Fair  Appetite:  Good  Current Medications: Current Facility-Administered Medications  Medication Dose Route Frequency Provider Last Rate Last Admin   acetaminophen (TYLENOL) tablet 650 mg  650 mg Oral Q6H PRN Rankin, Shuvon B, NP   650 mg at 11/27/21 1605   albuterol (VENTOLIN HFA) 108 (90 Base) MCG/ACT inhaler 1-2 puff  1-2 puff Inhalation Q4H PRN Rankin, Shuvon B, NP   2 puff at 11/27/21 0737   amLODipine (NORVASC) tablet 10 mg  10 mg Oral Daily Rankin, Shuvon B, NP   10 mg at 11/28/21 0741   aspirin EC tablet 325 mg  325 mg Oral Daily Rankin, Shuvon B, NP   325 mg at 11/28/21 0740   atorvastatin (LIPITOR) tablet 40 mg  40 mg Oral Daily Rankin, Shuvon B, NP   40 mg at 11/28/21 0740   cloNIDine (CATAPRES) tablet 0.1 mg  0.1 mg Oral Q8H PRN Nelda Marseille, Amy E, MD   0.1 mg at  11/24/21 1226   clopidogrel (PLAVIX) tablet 75 mg  75 mg Oral Daily Rankin, Shuvon B, NP   75 mg at 11/28/21 0741   diclofenac Sodium (VOLTAREN) 1 % topical gel 2 g  2 g Topical QID Damita Dunnings B, MD   2 g at 11/28/21 1246   FLUoxetine (PROZAC) capsule 20 mg  20 mg Oral Daily Damita Dunnings B, MD   20 mg at 11/28/21 0741   fluticasone (FLONASE) 50 MCG/ACT nasal spray 1 spray  1 spray Each Nare Daily Damita Dunnings B, MD   1 spray at 11/28/21 0741   gabapentin (NEURONTIN) capsule 400 mg  400 mg Oral TID Freida Busman, MD  400 mg at 11/28/21 1246   influenza vac split quadrivalent PF (FLUARIX) injection 0.5 mL  0.5 mL Intramuscular Tomorrow-1000 Nelda Marseille, Amy E, MD       loratadine (CLARITIN) tablet 10 mg  10 mg Oral Daily Nicholes Rough, NP   10 mg at 11/28/21 [id number]   magic mouthwash  5 mL Oral TID PRN Harlow Asa, MD   5 mL at 11/27/21 2127   magnesium hydroxide (MILK OF MAGNESIA) suspension 30 mL  30 mL Oral Daily PRN Rankin, Shuvon B, NP       melatonin tablet 6 mg  6 mg Oral QHS Rob Mciver, NP   6 mg at 11/27/21 2126   metFORMIN (GLUCOPHAGE) tablet 500 mg  500 mg Oral Q breakfast Damita Dunnings B, MD   500 mg at 11/28/21 0741   methocarbamol (ROBAXIN) tablet 500 mg  500 mg Oral Q6H PRN Nicholes Rough, NP   500 mg at 11/27/21 1605   mometasone-formoterol (DULERA) 100-5 MCG/ACT inhaler 2 puff  2 puff Inhalation BID Harlow Asa, MD   2 puff at 11/28/21 0741   montelukast (SINGULAIR) tablet 10 mg  10 mg Oral QHS Nelda Marseille, Amy E, MD   10 mg at 11/27/21 2126   multivitamin with minerals tablet 1 tablet  1 tablet Oral Daily Rankin, Shuvon B, NP   1 tablet at 11/28/21 0741   nicotine (NICODERM CQ - dosed in mg/24 hours) patch 14 mg  14 mg Transdermal Daily Damita Dunnings B, MD   14 mg at 11/28/21 0740   OLANZapine (ZYPREXA) tablet 7.5 mg  7.5 mg Oral QHS Shela Esses, NP   7.5 mg at 11/27/21 2126   ondansetron (ZOFRAN-ODT) disintegrating tablet 4 mg  4 mg Oral Q8H PRN Damita Dunnings B,  MD       pantoprazole (PROTONIX) EC tablet 40 mg  40 mg Oral Daily Rankin, Shuvon B, NP   40 mg at 11/28/21 0740   traZODone (DESYREL) tablet 50 mg  50 mg Oral QHS Ilah Boule, NP   50 mg at 11/27/21 2126   umeclidinium bromide (INCRUSE ELLIPTA) 62.5 MCG/ACT 1 puff  1 puff Inhalation Daily Harlow Asa, MD   1 puff at 11/28/21 [id number]   Lab Results:  Results for orders placed or performed during the hospital encounter of 11/21/21 (from the past 48 hour(s))  Resp Panel by RT-PCR (Flu A&B, Covid) Nasopharyngeal Swab     Status: None   Collection Time: 11/26/21  3:39 PM   Specimen: Nasopharyngeal Swab; Nasopharyngeal(NP) swabs in vial transport medium  Result Value Ref Range   SARS Coronavirus 2 by RT PCR NEGATIVE NEGATIVE    Comment: (NOTE) SARS-CoV-2 target nucleic acids are NOT DETECTED.  The SARS-CoV-2 RNA is generally detectable in upper respiratory specimens during the acute phase of infection. The lowest concentration of SARS-CoV-2 viral copies this assay can detect is 138 copies/mL. A negative result does not preclude SARS-Cov-2 infection and should not be used as the sole basis for treatment or other patient management decisions. A negative result may occur with  improper specimen collection/handling, submission of specimen other than nasopharyngeal swab, presence of viral mutation(s) within the areas targeted by this assay, and inadequate number of viral copies(<138 copies/mL). A negative result must be combined with clinical observations, patient history, and epidemiological information. The expected result is Negative.  Fact Sheet for Patients:  EntrepreneurPulse.com.au  Fact Sheet for Healthcare Providers:  IncredibleEmployment.be  This test is no t yet approved or cleared  by the [place] and  has been authorized for detection and/or diagnosis of SARS-CoV-2 by FDA under an Emergency Use Authorization (EUA). This EUA will  remain  in effect (meaning this test can be used) for the duration of the COVID-19 declaration under Section 564(b)(1) of the Act, 21 U.S.C.section 360bbb-3(b)(1), unless the authorization is terminated  or revoked sooner.       Influenza A by PCR NEGATIVE NEGATIVE   Influenza B by PCR NEGATIVE NEGATIVE    Comment: (NOTE) The Xpert Xpress SARS-CoV-2/FLU/RSV plus assay is intended as an aid in the diagnosis of influenza from Nasopharyngeal swab specimens and should not be used as a sole basis for treatment. Nasal washings and aspirates are unacceptable for Xpert Xpress SARS-CoV-2/FLU/RSV testing.  Fact Sheet for Patients: EntrepreneurPulse.com.au  Fact Sheet for Healthcare Providers: IncredibleEmployment.be  This test is not yet approved or cleared by the [place] FDA and has been authorized for detection and/or diagnosis of SARS-CoV-2 by FDA under an Emergency Use Authorization (EUA). This EUA will remain in effect (meaning this test can be used) for the duration of the COVID-19 declaration under Section 564(b)(1) of the Act, 21 U.S.C. section 360bbb-3(b)(1), unless the authorization is terminated or revoked.  Performed at [facility], [place] [address], [place], [place] [address]   Glucose, capillary     Status: Abnormal   Collection Time: 11/26/21  9:21 PM  Result Value Ref Range   Glucose-Capillary 211 (H) 70 - 99 mg/dL    Comment: Glucose reference range applies only to samples taken after fasting for at least 8 hours.   Comment 1 Notify RN   Glucose, capillary     Status: Abnormal   Collection Time: 11/27/21  5:10 AM  Result Value Ref Range   Glucose-Capillary 136 (H) 70 - 99 mg/dL    Comment: Glucose reference range applies only to samples taken after fasting for at least 8 hours.   Comment 1 Notify RN   Glucose, capillary     Status: Abnormal   Collection Time: 11/27/21  6:38 PM  Result Value Ref Range    Glucose-Capillary 217 (H) 70 - 99 mg/dL    Comment: Glucose reference range applies only to samples taken after fasting for at least 8 hours.  Glucose, capillary     Status: Abnormal   Collection Time: 11/28/21  5:49 AM  Result Value Ref Range   Glucose-Capillary 148 (H) 70 - 99 mg/dL    Comment: Glucose reference range applies only to samples taken after fasting for at least 8 hours.    Blood Alcohol level:  Lab Results  Component Value Date   ETH <10 11/20/2021   ETH <10 0000000    Metabolic Disorder Labs: Lab Results  Component Value Date   HGBA1C 6.5 (H) 11/22/2021   MPG 139.85 11/22/2021   MPG 139.85 11/22/2021   No results found for: PROLACTIN Lab Results  Component Value Date   CHOL 169 11/22/2021   TRIG 264 (H) 11/22/2021   HDL 42 11/22/2021   CHOLHDL 4.0 11/22/2021   VLDL 53 (H) 11/22/2021   LDLCALC 74 11/22/2021   LDLCALC 65 11/22/2021    Physical Findings: AIMS: Facial and Oral Movements Muscles of Facial Expression: None, normal Lips and Perioral Area: None, normal Jaw: None, normal Tongue: None, normal,Extremity Movements Upper (arms, wrists, hands, fingers): None, normal Lower (legs, knees, ankles, toes): None, normal, Trunk Movements Neck, shoulders, hips: None, normal, Overall Severity Severity of abnormal movements (highest score  from questions above): None, normal Incapacitation due to abnormal movements: None, normal Patient's awareness of abnormal movements (rate only patient's report): No Awareness, Dental Status Current problems with teeth and/or dentures?: No Does patient usually wear dentures?: No   Musculoskeletal: Strength & Muscle Tone: decreased Gait & Station:  in wheelchair Patient leans: N/A  Psychiatric Specialty Exam:   Presentation  General Appearance: -- Disheveled, personal hygiene encouraged   Eye Contact: Fair   Speech:clear and coherent, normal rate   Speech Volume: Normal   Mood and Affect  Mood: Depressed    Affect: Congruent    Thought Process  Thought Processes:Coherent, goal directed   Descriptions of Associations:Intact   Orientation:Full (Time, Place and Person)   Thought Content: Denies SI, HI, AVH, paranoia, delusions    Suicidal Thoughts:Denies   Homicidal Thoughts:Denies    Sensorium  Memory:Immediate Fair; Recent Fair; Remote Fair   Judgment:-- Fair    Insight:Shallow    Executive Functions  Concentration:Fair   Attention Span:Fair   [place]    Psychomotor Activity  Psychomotor Activity:Using wheelchair - no tremors or restlessness noted    Assets  Assets:Resilience    Sleep  Total time unrecorded   Physical Exam Vitals reviewed.  Constitutional:      Appearance: She is well-developed.  HENT:     Head: Normocephalic and atraumatic.     Comments: No patches or yeast on cheeks or tongue, no redness or tongue swelling and no tongue lesions noted; no tongue tremor or involuntary tongue or mouth movements noted Pulmonary:     Effort: No tachypnea.     Breath sounds: No decreased breath sounds.  Chest:     Chest wall: No mass or deformity.  Musculoskeletal:     Cervical back: Normal range of motion.  Lymphadenopathy:     Cervical: No cervical adenopathy.  Skin:    General: Skin is dry.  Neurological:     Mental Status: She is alert.   Review of Systems  Constitutional: Negative.  Negative for fever.  HENT: Negative.         Top of tongue sore  Eyes: Negative.   Respiratory:  Negative for cough and shortness of breath.   Cardiovascular: Negative.  Negative for chest pain.  Gastrointestinal: Negative.  Negative for constipation, diarrhea, nausea and vomiting.  Genitourinary:  Negative for dysuria, frequency and urgency.  Musculoskeletal:  Positive for myalgias (RLE pain, wears immobilizer).  Skin: Negative.   Neurological: Negative.   Psychiatric/Behavioral:  Positive for depression (resolving with  current medications) and substance abuse (history of cocaine abuse). Negative for hallucinations and suicidal ideas. The patient is nervous/anxious (reports that medications are helping).   Blood pressure 137/85, pulse 83, temperature 97.9 F (36.6 C), temperature source Oral, resp. rate 20, height 5\' 1"  (1.549 m), weight 114.2 kg, SpO2 99 %. Body mass index is 47.58 kg/m.  Treatment Plan Summary: Daily contact with patient to assess and evaluate symptoms and progress in treatment and Medication management  MDD, recurrent, severe w/ psychotic features ( R/O SIMD vs SIPD) Hx of PTSD - Continue Prozac 20mg  daily - Hold PRN Vistaril with start of magic mouthwash for tongue pain which has benadryl in it - Continue gabapentin 400mg  TID to help with anxiety and residual pain issues - Continue Zyprexa 7.5mg  QHS for psychosis - Shower assistance and prompts to tend to ADLs   Urine Culture (multiple species present - recollection suggested) - Repeat urine culture ordered  when she will give specimen - presently asymptomatic  Elevated A1c With patient's elevated A1c, current HLD, and obesity in conjunction with Zyprexa use we will start patient on Metformin. -Continue metformin 500mg  daily- Daily CBG's - Will need PCP f/u after discharge   Stimulant use disorder, severe, cocaine Cannabis use disorder - counseled on need to abstain from use - will discuss outpatient SA treatment options with SW - Monitoring for signs of withdrawal   CVA history:  - continue home Plavix 75mg , hx of CVA - Continue home ASA, 81 mg, hx of CVA   HLD -- Continue home Lipitor 40mg    Tobacco use disorder -  Decrease to 14mg  patch due to reported Tobacco use of 0.5ppd   GERD - Protonix 40mg  daily   COPD - Continue home Dulera 2 puffs bid - Continue home Incruse Ellipta 1 puff daily - Continue home Albuterol PRN - Continue home Singulair 10mg  daily    HTN - Norvasc 10mg  daily - Clonidine 0.1mg  q8 hours  PRN SBP>160 or DBP>100; hold for HR <60   CP - resolved - Sent to ED with negative Troponin and CXR shows atelectasis; EKG unchanged compared to previous tracing - per ED no further w/u indicated at this time     Mobility issues - PT consult ordered; continue wheelchair and walker  - Leg rest ordered for wheelchair , for day time use ONLY - Voltaren gel and Robaxin 500mg  TID PRN for pain - SW request for PCP to address knee issues  Tongue Pain - Magic mouthwash tid PRN ordered - advised to swish mouth after use of inhaled steroid and will monitor  Nicholes Rough, NP 11/28/2021, 3:33 PM Patient ID: Cathy Hall, female   DOB: [birth year], 51 y.o.   MRN: [id number]

## 2021-11-28 NOTE — BHH Counselor (Signed)
CSW met with patient per patient request.  She asked if social worker was able to get in contact with son which social worker denied.  Patient reports that he works during the day and may not answer the phone til later.  Patient discussed agreement to go to [facility] as a discharge plan and was preoccupied about when her discharge date would be.  Patient requested if she could get transportation from the hospital and problem solved ways to get her clothes.  Patient requested that this CSW call boyfriend to see if he would drop off her clothes which CSW discussed how patient would need to arrange to get personal items delivered and that was something that CSW could do.    Katilynn Sinkler, LCSW, [place] Social Worker  [facility]

## 2021-11-28 NOTE — BHH Group Notes (Signed)
Spiritual care group on grief and loss facilitated by chaplain Katy Dillon Mcreynolds, BCC   Group Goal:   Support / Education around grief and loss   Members engage in facilitated group support and psycho-social education.   Group Description:   Following introductions and group rules, group members engaged in facilitated group dialog and support around topic of loss, with particular support around experiences of loss in their lives. Group Identified types of loss (relationships / self / things) and identified patterns, circumstances, and changes that precipitate losses. Reflected on thoughts / feelings around loss, normalized grief responses, and recognized variety in grief experience. Group noted Worden's four tasks of grief in discussion.   Group drew on Adlerian / Rogerian, narrative, MI,   Patient Progress: Did not attend.  

## 2021-11-29 LAB — GLUCOSE, CAPILLARY: Glucose-Capillary: 122 mg/dL — ABNORMAL HIGH (ref 70–99)

## 2021-11-29 MED ORDER — TRAZODONE HCL 50 MG PO TABS: 50.0000 mg | ORAL_TABLET | Freq: Every day | ORAL | 0 refills | Status: DC

## 2021-11-29 MED ORDER — METFORMIN HCL 500 MG PO TABS: 500.0000 mg | ORAL_TABLET | Freq: Every day | ORAL | 0 refills | Status: DC

## 2021-11-29 MED ORDER — OLANZAPINE 7.5 MG PO TABS: 7.5000 mg | ORAL_TABLET | Freq: Every day | ORAL | 0 refills | Status: DC

## 2021-11-29 MED ORDER — GABAPENTIN 400 MG PO CAPS: 400.0000 mg | ORAL_CAPSULE | Freq: Three times a day (TID) | ORAL | 0 refills | Status: DC

## 2021-11-29 MED ORDER — HYDROXYZINE HCL 50 MG PO TABS
50.0000 mg | ORAL_TABLET | Freq: Once | ORAL | Status: AC
  Administered 2021-11-29: 12:00:00 50 mg via ORAL
  Filled 2021-11-29 (×2): qty 1

## 2021-11-29 MED ORDER — NICOTINE 14 MG/24HR TD PT24: 14.0000 mg | MEDICATED_PATCH | Freq: Every day | TRANSDERMAL | 0 refills | Status: DC

## 2021-11-29 MED ORDER — FLUOXETINE HCL 20 MG PO CAPS: 20.0000 mg | ORAL_CAPSULE | Freq: Every day | ORAL | 0 refills | Status: DC

## 2021-11-29 MED ORDER — MAGIC MOUTHWASH: 5.0000 mL | Freq: Three times a day (TID) | ORAL | 0 refills | Status: DC | PRN

## 2021-11-29 MED ORDER — MELATONIN 3 MG PO TABS: 6.0000 mg | ORAL_TABLET | Freq: Every day | ORAL | 0 refills | Status: DC

## 2021-11-29 MED ORDER — MELATONIN 3 MG PO TABS: 6.0000 mg | ORAL_TABLET | Freq: Every day | ORAL | 0 refills | Status: AC

## 2021-11-29 MED ORDER — METHOCARBAMOL 500 MG PO TABS: 500.0000 mg | ORAL_TABLET | Freq: Four times a day (QID) | ORAL | 0 refills | Status: DC | PRN

## 2021-11-29 NOTE — Group Note (Signed)
Recreation Therapy Group Note   Group Topic:Stress Management  Group Date: 11/29/2021 Start Time: 0930 End Time: 1000 Facilitators: Victorino Sparrow, LRT,CTRS Location: [address] Dayroom   Goal Area(s) Addresses:  Patient will actively participate in stress management techniques presented during session.  Patient will successfully identify benefit of practicing stress management post d/c.   Group Description:  Guided Imagery. LRT provided education, instruction, and demonstration on practice of visualization via guided imagery. Patient was asked to participate in the technique introduced during session. LRT debriefed including topics of mindfulness, stress management and specific scenarios each patient could use these techniques. Patients were given suggestions of ways to access scripts post d/c and encouraged to explore Youtube and other apps available on smartphones, tablets, and computers.   Clinical Observations/Individualized Feedback:  Unable to do group due to MHT group going over time.     Plan: Continue to engage patient in RT group sessions 2-3x/week.   Victorino Sparrow, LRT,CTRS 11/29/2021 12:50 PM

## 2021-11-29 NOTE — Progress Notes (Signed)
°  [facility] Adult Case Management Discharge Plan :  Will you be returning to the same living situation after discharge:  No. Patient will be going to stay with her son and his wife in [place], [place] At discharge, do you have transportation home?: No. Will need to utilize safe transport Do you have the ability to pay for your medications: Yes,  patient provided resources for discounted prescriptions and offered appointments at clinics that assist with financial assistance and connecting to resources  Release of information consent forms completed and in the chart;  Patient's signature needed at discharge.  Patient to Follow up at:  [facility] Follow up.   Specialty: Behavioral Health Why: You may go to this provider for therapy and medication management services during walk in hours:  Monday through Wednesday, from 7:45 am to 11:00 am.  Services are provided on a first come, first served basis. Contact information: [place]. Call.   Specialty: Hospice and Palliative Medicine Why: Please call to personally schedule an appointment for grief/bereavement therapy services. Contact information: [place]. Go on 12/11/2021.   Why: You have an appointment for for primary care services on  12/11/21 at 1:30 pm.  This appointment will be held in person. Contact information: [address] [place], [place] [address]  [place] Fax: [phone]        Services, [facility] Follow up.   Why: A referral has been made to this provider for inpatient substance use intensive therapy and medication management services. Contact information: [place] [address] [phone]         [facility]. Go to.   Why: If you want to get  connected to services please call to get set up and learn more about what they offer.  [facility] is a recovery education center offering a wide variety of classes and activities to adults interested in learning more about recovery from mental health or substance use challenges. [facility] is staffed by [employer] Specialists who are available to provide one-on-one recovery coaching. The Peer Support Staff are able to use their own lived experience of recovery to empower others on their recovery journeys. [facility] services are free and open to the public. No insurance, referral or diagnosis are required. Contact information: [place], [place] [address] [phone] Open: 10am-5pm                Next level of care provider has access to [place] and Suicide Prevention discussed: Yes,  Daughter in law, Malvin Johns, and Daughter in [place], [name]     Has patient been referred to the Quitline?: Yes, faxed on 11/29/2021  Patient has been referred for addiction treatment: Yes  Joeanthony Seeling E Jaira Canady, LCSW 11/29/2021, 10:06 AM

## 2021-11-29 NOTE — Progress Notes (Signed)
°   11/29/21 0500  °Sleep  °Number of Hours 7.75  ° ° °

## 2021-11-29 NOTE — Discharge Summary (Signed)
Physician Discharge Summary Note  Patient:  Cathy Hall is an 51 y.o., female MRN:  [id number] DOB:  [birth year] Patient phone:  [phone] (home)  Patient address:   [address] [place] [address],  Total Time spent with patient: 15 minutes  Date of Admission:  11/21/2021 Date of Discharge: 11/29/2021  Reason for Admission: Endorsing SI with recent cocaine use  Principal Problem: MDD (major depressive disorder), recurrent, severe, with psychosis ([place]) Discharge Diagnoses: Principal Problem:   MDD (major depressive disorder), recurrent, severe, with psychosis ([place]) Active Problems:   Cocaine use disorder, moderate, dependence ([name])   Complex posttraumatic stress disorder   Cocaine abuse ([place])   Impaired ambulation   Past Psychiatric History:  Endorses at least 1 prior hospitalization around the death of son. Had OP psych in [name]. Has been to [facility] 08/2020.    Previous medications: Wellbutrin (success), Prozac ( success), Vistaril (success), trazodone (nightmares), Gabapentin (does not recall what this was for)  Past Medical History:  Past Medical History:  Diagnosis Date   Anxiety    Asthma    COPD (chronic obstructive pulmonary disease) ([place])    Depression    Hypertension    Major depression    PTSD (post-traumatic stress disorder)     Past Surgical History:  Procedure Laterality Date   ECTOPIC PREGNANCY SURGERY     Family History:  Family History  Problem Relation Age of Onset   Cerebral aneurysm Mother    Diabetes Sister    Other Neg Hx    Family Psychiatric  History: Unknown Social History:  Social History   Substance and Sexual Activity  Alcohol Use Yes   Comment: occasional     Social History   Substance and Sexual Activity  Drug Use Yes   Types: Cocaine, Marijuana   Comment: last cocaine use 3 days ago    Social History   Socioeconomic History   Marital status: Significant Other    Spouse name: Not on file   Number of  children: Not on file   Years of education: Not on file   Highest education level: Not on file  Occupational History   Not on file  Tobacco Use   Smoking status: Every Day    Packs/day: 1.00    Types: Cigarettes   Smokeless tobacco: Never  Vaping Use   Vaping Use: Never used  Substance and Sexual Activity   Alcohol use: Yes    Comment: occasional   Drug use: Yes    Types: Cocaine, Marijuana    Comment: last cocaine use 3 days ago   Sexual activity: Not Currently  Other Topics Concern   Not on file  Social History Narrative   ** Merged History Encounter **       Social Determinants of Health   Financial Resource Strain: Not on file  Food Insecurity: Not on file  Transportation Needs: Not on file  Physical Activity: Not on file  Stress: Not on file  Social Connections: Not on file    Hospital Course:  Cathy Hall is a 51 yo patient w. PPH of MDD and PTSD who appears to present with a current episode of MDD in the context of cocaine and THC use and recent anniversaries of losses.  On admission patient began endorsing chest pain with an abnormal EKG concerning for ischemia.  Patient was briefly transferred back to the ED however, was later medically cleared and returned to behavioral health Hospital.  Patient was noted to be very  isolative with severe psychomotor retardation and poor hygiene.  Patient was encouraged to participate in daily hygiene.  Patient was restarted on Prozac and her home Zyprexa.  Due to patient's elevated A1c and chronic obesity patient was also started on metformin.  It was decided that low-dose Zyprexa would be continued as patient had success on this medication in the past and would not be starting metformin.  PT consult was made as patient endorsed that her knee was making it difficult for her to interact.  PT assessed patient and continued therapy with patient throughout her hospitalization.  Patient had both a walker and wheelchair in order to  assist with patient's ability to get around on the unit.  Patient was also prescribed muscle relaxants and Voltaren gel for knee pain.  Patient's gabapentin was also increased to address both anxiety and neuropathic pains.  With significant encouragement patient began daily hygiene, attending groups, and spending less time isolating to her room.  Patient began to endorse improvement in mood as her Prozac was titrated up to previous home dose of 20 mg.  Patient endorsed sleeping a bit better however, she endorsed that getting her OSA treated would likely benefit her more.  Patient reached out to her family during hospitalization who continue to support patient and offered her a place to stay at discharge.  At time of discharge patient endorse feeling more motivated than at admission.  Patient endorsed intent to remain compliant with medications and to continue to practice skills that she learned in therapy to help her cope with the loss of family members that have continue to weigh on her.  Patient denied SI, HI and AVH for at least 48 hours prior to discharge.  Physical Findings: AIMS: Facial and Oral Movements Muscles of Facial Expression: None, normal Lips and Perioral Area: None, normal Jaw: None, normal Tongue: None, normal,Extremity Movements Upper (arms, wrists, hands, fingers): None, normal Lower (legs, knees, ankles, toes): None, normal, Trunk Movements Neck, shoulders, hips: None, normal, Overall Severity Severity of abnormal movements (highest score from questions above): None, normal Incapacitation due to abnormal movements: None, normal Patient's awareness of abnormal movements (rate only patient's report): No Awareness, Dental Status Current problems with teeth and/or dentures?: No Does patient usually wear dentures?: No  CIWA:    COWS:     Musculoskeletal: Strength & Muscle Tone: decreased Gait & Station:  In wheelchair Patient leans: N/A   Psychiatric Specialty  Exam:  Presentation  General Appearance: Casual (up in wheelchair, dressed)  Eye Contact:Good  Speech:Clear and Coherent  Speech Volume:Normal  Handedness:Right   Mood and Affect  Mood:-- ("I'm ok.")  Affect:Appropriate   Thought Process  Thought Processes:Coherent  Descriptions of Associations:Intact  Orientation:Full (Time, Place and Person)  Thought Content:Logical  History of Schizophrenia/Schizoaffective disorder:No  Duration of Psychotic Symptoms:N/A  Hallucinations:Hallucinations: None  Ideas of Reference:None  Suicidal Thoughts:Suicidal Thoughts: No  Homicidal Thoughts:Homicidal Thoughts: No   Sensorium  Memory:Immediate Good; Recent Good  Judgment:Fair  Insight:Fair   Executive Functions  Concentration:Good  Attention Span:Good  [place] of Knowledge:Good  Language:Good   Psychomotor Activity  Psychomotor Activity:Psychomotor Activity: Normal   Assets  Assets:Communication Skills; Resilience; Housing; Desire for Improvement   Sleep  Sleep:Sleep: Good    Physical Exam: Physical Exam Constitutional:      Appearance: She is obese.  HENT:     Head: Normocephalic and atraumatic.  Skin:    General: Skin is dry.  Neurological:     Mental Status: She is  alert and oriented to person, place, and time.   Review of Systems  Psychiatric/Behavioral:  Negative for depression, hallucinations and suicidal ideas.   Blood pressure 134/84, pulse 84, temperature 97.9 F (36.6 C), temperature source Oral, resp. rate 20, height 5\' 1"  (1.549 m), weight 114.2 kg, SpO2 99 %. Body mass index is 47.58 kg/m.   Social History   Tobacco Use  Smoking Status Every Day   Packs/day: 1.00   Types: Cigarettes  Smokeless Tobacco Never   Tobacco Cessation:  A prescription for an FDA-approved tobacco cessation medication provided at discharge   Blood Alcohol level:  Lab Results  Component Value Date   [facility] <10 11/20/2021   ETH <10  0000000    Metabolic Disorder Labs:  Lab Results  Component Value Date   HGBA1C 6.5 (H) 11/22/2021   MPG 139.85 11/22/2021   MPG 139.85 11/22/2021   No results found for: PROLACTIN Lab Results  Component Value Date   CHOL 169 11/22/2021   TRIG 264 (H) 11/22/2021   HDL 42 11/22/2021   CHOLHDL 4.0 11/22/2021   VLDL 53 (H) 11/22/2021   LDLCALC 74 11/22/2021   LDLCALC 65 11/22/2021    See Psychiatric Specialty Exam and Suicide Risk Assessment completed by Attending Physician prior to discharge.  Discharge destination:  Home  Is patient on multiple antipsychotic therapies at discharge:  No   Has Patient had three or more failed trials of antipsychotic monotherapy by history:  No  Recommended Plan for Multiple Antipsychotic Therapies: NA   Allergies as of 11/29/2021       Reactions   Nsaids Other (See Comments)   Other reaction(s): Kidney Disorder        Medication List     STOP taking these medications    menthol-cetylpyridinium 3 MG lozenge Commonly known as: CEPACOL   Resource ThickenUp Clear Powd       TAKE these medications      Indication  acetaminophen 325 MG tablet Commonly known as: TYLENOL TAKE 2 TABLETS (650 MG TOTAL) BY MOUTH EVERY FOUR HOURS AS NEEDED FOR MILD PAIN (OR TEMP > 37.5 C (99.5 F)). What changed:  how much to take how to take this when to take this reasons to take this  Indication: Pain   albuterol 108 (90 Base) MCG/ACT inhaler Commonly known as: VENTOLIN HFA INHALE 2 PUFFS INTO THE LUNGS EVERY FOUR HOURS AS NEEDED FOR WHEEZING OR SHORTNESS OF BREATH. What changed:  how much to take how to take this when to take this reasons to take this  Indication: Chronic Obstructive Lung Disease   amLODipine 10 MG tablet Commonly known as: NORVASC Take 10 mg by mouth daily. What changed: Another medication with the same name was removed. Continue taking this medication, and follow the directions you see here.  Indication: High  Blood Pressure Disorder   Artificial Tears 0.2-0.2-1 % Soln Generic drug: Glycerin-Hypromellose-PEG 400 PLACE 1 DROP INTO BOTH EYES DAILY AS NEEDED FOR DRY EYES. What changed:  how much to take how to take this when to take this reasons to take this  Indication: Excessive Cornea and Conjunctiva Dryness   atorvastatin 40 MG tablet Commonly known as: LIPITOR TAKE 1 TABLET (40 MG TOTAL) BY MOUTH DAILY. What changed: how much to take  Indication: High Amount of Fats in the Blood   Cepacol Sore Throat Max Numb 15-3.6 MG Lozg Generic drug: Benzocaine-Menthol TAKE 1 LOZENGE (3 MG TOTAL) BY MOUTH AS NEEDED FOR SORE THROAT. What changed:  how  much to take how to take this when to take this reasons to take this  Indication: Oral pain   clopidogrel 75 MG tablet Commonly known as: PLAVIX TAKE 1 TABLET (75 MG TOTAL) BY MOUTH DAILY. What changed: how much to take  Indication: Cerebrovascular Accident or Stroke   diclofenac Sodium 1 % Gel Commonly known as: VOLTAREN APPLY 1 APPLICATION TOPICALLY FOUR TIMES DAILY. What changed:  how much to take how to take this when to take this  Indication: Joint Damage causing Pain and Loss of Function   Dulera 100-5 MCG/ACT Aero Generic drug: mometasone-formoterol INHALE 2 PUFFS INTO THE LUNGS TWO TIMES DAILY.  Indication: Chronic Obstructive Lung Disease   FLUoxetine 20 MG capsule Commonly known as: PROZAC Take 1 capsule (20 mg total) by mouth daily. Start taking on: November 30, 2021 What changed:  medication strength how much to take  Indication: Depression   fluticasone 50 MCG/ACT nasal spray Commonly known as: FLONASE PLACE 2 SPRAYS INTO BOTH NOSTRILS DAILY.  Indication: Stuffy Nose   gabapentin 400 MG capsule Commonly known as: NEURONTIN Take 1 capsule (400 mg total) by mouth 3 (three) times daily. What changed:  medication strength how much to take how to take this when to take this  Indication: Anxiety   hydrOXYzine  25 MG tablet Commonly known as: ATARAX TAKE 1 TABLET (25 MG TOTAL) BY MOUTH THREE TIMES DAILY AS NEEDED FOR ANXIETY. What changed:  how much to take how to take this when to take this  Indication: Feeling Anxious   Incruse Ellipta 62.5 MCG/ACT Aepb Generic drug: umeclidinium bromide INHALE 1 PUFF INTO THE LUNGS DAILY. What changed: when to take this  Indication: Chronic Obstructive Lung Disease   loratadine 10 MG tablet Commonly known as: CLARITIN TAKE 1 TABLET (10 MG TOTAL) BY MOUTH DAILY. What changed: how much to take  Indication: Allergic Conjunctivitis   magic mouthwash Soln Take 5 mLs by mouth 3 (three) times daily as needed for mouth pain. Shake Well. Swish and swallow. Do not give within 2 hours of Cipro, Avelox, Levaquin or tetracycline.  Indication: Oral pain   melatonin 3 MG Tabs tablet Take 2 tablets (6 mg total) by mouth at bedtime.  Indication: Trouble Sleeping   metFORMIN 500 MG tablet Commonly known as: GLUCOPHAGE Take 1 tablet (500 mg total) by mouth daily with breakfast. Start taking on: November 30, 2021  Indication: Type 2 Diabetes   methocarbamol 500 MG tablet Commonly known as: ROBAXIN Take 1 tablet (500 mg total) by mouth every 6 (six) hours as needed for muscle spasms.  Indication: Musculoskeletal Pain   montelukast 10 MG tablet Commonly known as: SINGULAIR TAKE 1 TABLET (10 MG TOTAL) BY MOUTH AT BEDTIME. What changed: how much to take  Indication: Asthma   multivitamin with minerals Tabs tablet Take 1 tablet by mouth daily.  Indication: Nutritional support   nicotine 14 mg/24hr patch Commonly known as: NICODERM CQ - dosed in mg/24 hours Place 1 patch (14 mg total) onto the skin daily. Start taking on: November 30, 2021  Indication: Nicotine Addiction   nystatin 100000 UNIT/ML suspension Commonly known as: MYCOSTATIN TAKE 5 MLS (500,000 UNITS TOTAL) BY MOUTH FOUR TIMES DAILY. What changed:  how much to take how to take this when to  take this  Indication: Candidiasis Fungal Infection of the Oropharynx   OLANZapine 7.5 MG tablet Commonly known as: ZYPREXA Take 1 tablet (7.5 mg total) by mouth at bedtime. What changed:  medication strength how much  to take  Indication: Major Depressive Disorder, Psychosis   Orajel 10 % mucosal gel Generic drug: benzocaine USE AS DIRECTED IN THE MOUTH OR THROAT FOUR TIMES DAILY AS NEEDED FOR MOUTH PAIN. What changed:  how much to take how to take this when to take this reasons to take this  Indication: Oral pain   pantoprazole 40 MG tablet Commonly known as: PROTONIX TAKE 1 TABLET (40 MG TOTAL) BY MOUTH DAILY. What changed: how much to take  Indication: Heartburn   polyvinyl alcohol 1.4 % ophthalmic solution Commonly known as: LIQUIFILM TEARS Place 1 drop into both eyes daily as needed for dry eyes.  Indication: Dry eyes   sodium chloride 0.65 % Soln nasal spray Commonly known as: OCEAN Place 1 spray into both nostrils as needed for congestion. What changed: Another medication with the same name was changed. Make sure you understand how and when to take each.  Indication: Historical support   V-R NASAL SPRAY SALINE 0.65 % nasal spray Generic drug: sodium chloride PLACE 1 SPRAY INTO BOTH NOSTRILS AS NEEDED FOR CONGESTION. What changed:  how much to take how to take this when to take this reasons to take this  Indication: Historical med   traZODone 50 MG tablet Commonly known as: DESYREL Take 1 tablet (50 mg total) by mouth at bedtime.  Indication: Trouble Sleeping   V-R ASPIRIN EC 325 MG EC tablet Generic drug: aspirin TAKE 1 TABLET (325 MG TOTAL) BY MOUTH DAILY. What changed:  how much to take Another medication with the same name was removed. Continue taking this medication, and follow the directions you see here.  Indication: Hx of CVA        Follow-up Information     [facility] Follow up.   Specialty: Behavioral  Health Why: You may go to this provider for therapy and medication management services during walk in hours:  Monday through Wednesday, from 7:45 am to 11:00 am.  Services are provided on a first come, first served basis. Contact information: [place]. Call.   Specialty: Hospice and Palliative Medicine Why: Please call to personally schedule an appointment for grief/bereavement therapy services. Contact information: [place]. Go on 12/11/2021.   Why: You have an appointment for for primary care services on  12/11/21 at 1:30 pm.  This appointment will be held in person. Contact information: [address] [place], [place] [address]  [place] Fax: [place]. Go to.   Why: If you want to get connected to services please call to get set up and learn more about what they offer.  [place] is a recovery education center offering a wide variety of classes and activities to adults interested in learning more about recovery from mental health or substance use challenges. [facility] is staffed by [employer] Specialists who are available to provide one-on-one recovery coaching. The Peer Support Staff are able to use their own lived experience of recovery to empower others on their recovery journeys. [place] services are free and open to the public. No insurance, referral or diagnosis are required. Contact information: [address] [place] [place], [place] [address] [phone] Open: 10am-5pm  Follow-up recommendations:  Follow up recommendations: - Activity as tolerated. - Diet as recommended by PCP. - Keep all scheduled follow-up appointments as recommended.   Comments:  Patient is instructed to take all prescribed  medications as recommended. Report any side effects or adverse reactions to your outpatient psychiatrist. Patient is instructed to abstain from alcohol and illegal drugs while on prescription medications. In the event of worsening symptoms, patient is instructed to call the crisis hotline, 911, or go to the nearest emergency department for evaluation and treatment.    Signed:  PGY 2 Freida Busman, MD 11/29/2021, 12:23 PM

## 2021-11-29 NOTE — BHH Suicide Risk Assessment (Signed)
Suicide Risk Assessment  Discharge Assessment    [facility] Discharge Suicide Risk Assessment   Principal Problem: MDD (major depressive disorder), recurrent, severe, with psychosis ([place]) Discharge Diagnoses: Principal Problem:   MDD (major depressive disorder), recurrent, severe, with psychosis ([place]) Active Problems:   Cocaine use disorder, moderate, dependence ([place])   Complex posttraumatic stress disorder   Cocaine abuse ([place])   Impaired ambulation   Total Time spent with patient: 20 minutes Cathy DukesJosepha Hall is a 51 yo patient w. PPH of MDD and PTSD who appears to present with a current episode of MDD in the context of substance use and recent anniversaries of losses. Patient was encouraged to interact on the unit and attend group therapy. Patient received PT eval due to difficulty ambulating and had continued therapy and received a wheelchair and walker to help patient integrate better onto the unit. Patient was restarted on her medications and titrated back up to her previous Prozac 20mg . Patient was continued on her home medications for chronic medical conditions. Patient responded well to medications and therapy intervention. At time of discharge patient denied SI, HI and AVH for 48 hrs. Patient endorsed sleeping better than on admission and agreed that having treatment for her OSA, would likely benefit her sleep, when she is discharged. Patient endorsed feeling that hospitalization and interventions were beneficial and she felt prepared to transition to outpatient care for her continued mood treatment and sobriety.    Musculoskeletal: Strength & Muscle Tone: within normal limits Gait & Station: normal Patient leans: N/A  Psychiatric Specialty Exam  Presentation  General Appearance: Casual (up in wheelchair, dressed)  Eye Contact:Good  Speech:Clear and Coherent  Speech Volume:Normal  Handedness:Right   Mood and Affect  Mood:-- ("I'm ok.")  Duration of Depression  Symptoms: Greater than two weeks  Affect:Appropriate   Thought Process  Thought Processes:Coherent  Descriptions of Associations:Intact  Orientation:Full (Time, Place and Person)  Thought Content:Logical  History of Schizophrenia/Schizoaffective disorder:No  Duration of Psychotic Symptoms:N/A  Hallucinations:Hallucinations: None  Ideas of Reference:None  Suicidal Thoughts:Suicidal Thoughts: No  Homicidal Thoughts:Homicidal Thoughts: No   Sensorium  Memory:Immediate Good; Recent Good  Judgment:Fair  Insight:Fair   Executive Functions  Concentration:Good  Attention Span:Good  [place] of Knowledge:Good  Language:Good   Psychomotor Activity  Psychomotor Activity:Psychomotor Activity: Normal   Assets  Assets:Communication Skills; Resilience; Housing; Desire for Improvement   Sleep  Sleep:Sleep: Good   Physical Exam: Physical Exam Constitutional:      Appearance: She is well-developed. She is obese.  Eyes:     Extraocular Movements: Extraocular movements intact.     Pupils: Pupils are equal, round, and reactive to light.  Skin:    General: Skin is dry.  Neurological:     Mental Status: She is alert.   Review of Systems  Psychiatric/Behavioral:  Negative for depression, hallucinations and suicidal ideas.   Blood pressure 134/84, pulse 84, temperature 97.9 F (36.6 C), temperature source Oral, resp. rate 20, height 5\' 1"  (1.549 m), weight 114.2 kg, SpO2 99 %. Body mass index is 47.58 kg/m.  Mental Status Per Nursing Assessment::   On Admission:  Suicidal ideation indicated by patient, Self-harm thoughts, Self-harm behaviors  Demographic Factors:  Low socioeconomic status  Loss Factors: Loss of significant relationship  Historical Factors: NA  Risk Reduction Factors:   Sense of responsibility to family and Living with another person, especially a relative  Continued Clinical Symptoms:  NA  Cognitive Features That  Contribute To Risk:  None  Suicide Risk:  Minimal: No identifiable suicidal ideation.  Patients presenting with no risk factors but with morbid ruminations; may be classified as minimal risk based on the severity of the depressive symptoms   Follow-up [facility] Follow up.   Specialty: Behavioral Health Why: You may go to this provider for therapy and medication management services during walk in hours:  Monday through Wednesday, from 7:45 am to 11:00 am.  Services are provided on a first come, first served basis. Contact information: [place]. Call.   Specialty: Hospice and Palliative Medicine Why: Please call to personally schedule an appointment for grief/bereavement therapy services. Contact information: [place] [address] [place]. Go on 12/11/2021.   Why: You have an appointment for for primary care services on  12/11/21 at 1:30 pm.  This appointment will be held in person. Contact information: [address] [place], [place] [address]  [place] Fax: [place]. Go to.   Why: If you want to get connected to services please call to get set up and learn more about what they offer.  [place] is a recovery education center offering a wide variety of classes and activities to adults interested in learning more about recovery from mental health or substance use challenges. [place] is staffed by [employer] Specialists who are available to provide one-on-one recovery coaching. The Peer Support Staff are able to use their own lived experience of recovery to empower others on their recovery journeys. [place] services are free and open to the public. No insurance, referral or diagnosis are required. Contact  information: [address] [place],  [address] [phone] Open: 10am-5pm                Plan Of Care/Follow-up recommendations:  Patient is instructed to take all prescribed medications as recommended. Report any side effects or adverse reactions to your outpatient psychiatrist. Patient is instructed to abstain from alcohol and illegal drugs while on prescription medications. In the event of worsening symptoms, patient is instructed to call the crisis hotline, 988, 911, or go to the nearest emergency department for evaluation and treatment.     PGY-2 Freida Busman, MD 11/29/2021, 12:08 PM

## 2021-11-29 NOTE — BHH Group Notes (Signed)
Patient did not attend Orientation group.  °

## 2021-11-29 NOTE — Plan of Care (Signed)
Discharge note  Patient verbalizes readiness for discharge. Follow up plan explained, AVS, Transition record and SRA given. Prescriptions and teaching provided. Belongings returned and signed for. Suicide safety plan completed and signed. Patient verbalizes understanding. Patient denies SI/HI and assures this Clinical research associate they will seek assistance should that change. Patient discharged to lobby where safe transport was waiting.  Problem: Education: Goal: Ability to state activities that reduce stress will improve Outcome: Adequate for Discharge   Problem: Coping: Goal: Ability to identify and develop effective coping behavior will improve Outcome: Adequate for Discharge   Problem: Self-Concept: Goal: Ability to identify factors that promote anxiety will improve Outcome: Adequate for Discharge Goal: Level of anxiety will decrease 11/29/2021 1256 by Raylene Miyamoto, RN Outcome: Adequate for Discharge 11/29/2021 0820 by Raylene Miyamoto, RN Outcome: Progressing Goal: Ability to modify response to factors that promote anxiety will improve 11/29/2021 1256 by Raylene Miyamoto, RN Outcome: Adequate for Discharge 11/29/2021 0820 by Raylene Miyamoto, RN Outcome: Progressing   Problem: Education: Goal: Utilization of techniques to improve thought processes will improve Outcome: Adequate for Discharge Goal: Knowledge of the prescribed therapeutic regimen will improve 11/29/2021 1256 by Raylene Miyamoto, RN Outcome: Adequate for Discharge 11/29/2021 0820 by Raylene Miyamoto, RN Outcome: Progressing   Problem: Activity: Goal: Interest or engagement in leisure activities will improve Outcome: Adequate for Discharge Goal: Imbalance in normal sleep/wake cycle will improve Outcome: Adequate for Discharge   Problem: Coping: Goal: Coping ability will improve Outcome: Adequate for Discharge Goal: Will verbalize feelings Outcome: Adequate for Discharge   Problem: Health Behavior/Discharge  Planning: Goal: Ability to make decisions will improve Outcome: Adequate for Discharge Goal: Compliance with therapeutic regimen will improve Outcome: Adequate for Discharge   Problem: Role Relationship: Goal: Will demonstrate positive changes in social behaviors and relationships Outcome: Adequate for Discharge   Problem: Safety: Goal: Ability to disclose and discuss suicidal ideas will improve Outcome: Adequate for Discharge Goal: Ability to identify and utilize support systems that promote safety will improve Outcome: Adequate for Discharge   Problem: Self-Concept: Goal: Will verbalize positive feelings about self Outcome: Adequate for Discharge Goal: Level of anxiety will decrease Outcome: Adequate for Discharge   Problem: Education: Goal: Knowledge of [facility] Education information/materials will improve Outcome: Adequate for Discharge Goal: Emotional status will improve Outcome: Adequate for Discharge Goal: Mental status will improve Outcome: Adequate for Discharge Goal: Verbalization of understanding the information provided will improve Outcome: Adequate for Discharge   Problem: Activity: Goal: Interest or engagement in activities will improve Outcome: Adequate for Discharge Goal: Sleeping patterns will improve Outcome: Adequate for Discharge   Problem: Coping: Goal: Ability to verbalize frustrations and anger appropriately will improve Outcome: Adequate for Discharge Goal: Ability to demonstrate self-control will improve Outcome: Adequate for Discharge   Problem: Health Behavior/Discharge Planning: Goal: Identification of resources available to assist in meeting health care needs will improve Outcome: Adequate for Discharge Goal: Compliance with treatment plan for underlying cause of condition will improve Outcome: Adequate for Discharge   Problem: Physical Regulation: Goal: Ability to maintain clinical measurements within normal limits will  improve Outcome: Adequate for Discharge   Problem: Safety: Goal: Periods of time without injury will increase Outcome: Adequate for Discharge   Problem: Education: Goal: Verbalization of understanding the information provided will improve Outcome: Adequate for Discharge   Problem: Coping: Goal: Level of anxiety will decrease Outcome: Adequate for Discharge Goal: Ability to identify and utilize appropriate coping strategies will improve Outcome: Adequate  for Discharge Goal: Ability to identify and utilize available resources and services will improve Outcome: Adequate for Discharge Goal: Will verbalize feelings Outcome: Adequate for Discharge   Problem: Activity: Goal: Will identify at least one activity in which they can participate Outcome: Adequate for Discharge   Problem: Coping: Goal: Ability to identify and develop effective coping behavior will improve Outcome: Adequate for Discharge Goal: Ability to interact with others will improve Outcome: Adequate for Discharge Goal: Demonstration of participation in decision-making regarding own care will improve Outcome: Adequate for Discharge Goal: Ability to use eye contact when communicating with others will improve Outcome: Adequate for Discharge   Problem: Health Behavior/Discharge Planning: Goal: Identification of resources available to assist in meeting health care needs will improve Outcome: Adequate for Discharge   Problem: Self-Concept: Goal: Will verbalize positive feelings about self Outcome: Adequate for Discharge   Problem: Education: Goal: Ability to make informed decisions regarding treatment will improve Outcome: Adequate for Discharge   Problem: Coping: Goal: Coping ability will improve Outcome: Adequate for Discharge   Problem: Health Behavior/Discharge Planning: Goal: Identification of resources available to assist in meeting health care needs will improve Outcome: Adequate for Discharge    Problem: Medication: Goal: Compliance with prescribed medication regimen will improve Outcome: Adequate for Discharge   Problem: Self-Concept: Goal: Ability to disclose and discuss suicidal ideas will improve Outcome: Adequate for Discharge Goal: Will verbalize positive feelings about self Outcome: Adequate for Discharge

## 2021-11-29 NOTE — Plan of Care (Signed)
  Problem: Self-Concept: Goal: Level of anxiety will decrease Outcome: Progressing Goal: Ability to modify response to factors that promote anxiety will improve Outcome: Progressing   Problem: Education: Goal: Knowledge of the prescribed therapeutic regimen will improve Outcome: Progressing   

## 2021-11-29 NOTE — Progress Notes (Signed)
CSW spoke with patient daughter in law, Aggie Cosier, wife of patient son whom she will be staying with.  Daughter in law reports that she will be assisting patient with getting to appointments.  She reports no additional safety concerns at this time and they plan to assist her with getting back on her feet.  Provided address to location to send patient to   [id number] [place], [place]    Arthor Gorter [place], Catherine, Retail buyer Social Worker  [facility]

## 2021-12-01 ENCOUNTER — Telehealth (HOSPITAL_COMMUNITY): Payer: Self-pay | Admitting: Emergency Medicine

## 2021-12-01 NOTE — BH Assessment (Signed)
Care Management - Follow Up [facility] Discharges   Patient has been placed in an inpatient psychiatric hospital [facility]) on 11-21-2021.

## 2021-12-02 NOTE — Group Note (Signed)
Late Entry Music Therapy Patient attended and stated she was glad she did, although she had actually not wanted to.

## 2023-01-31 ENCOUNTER — Emergency Department (HOSPITAL_COMMUNITY)
Admission: EM | Admit: 2023-01-31 | Discharge: 2023-01-31 | Disposition: A | Discharge status: Home / Self Care | Payer: Self-pay | Attending: Emergency Medicine | Admitting: Emergency Medicine

## 2023-01-31 ENCOUNTER — Emergency Department (HOSPITAL_COMMUNITY): Payer: Self-pay

## 2023-01-31 ENCOUNTER — Other Ambulatory Visit: Payer: Self-pay

## 2023-01-31 DIAGNOSIS — R1084 Generalized abdominal pain: Secondary | ICD-10-CM

## 2023-01-31 LAB — CBC WITH DIFFERENTIAL/PLATELET
Abs Immature Granulocytes: 0.03 10*3/uL (ref 0.00–0.07)
Basophils Absolute: 0 10*3/uL (ref 0.0–0.1)
Basophils Relative: 0 %
Eosinophils Absolute: 0.2 10*3/uL (ref 0.0–0.5)
Eosinophils Relative: 3 %
HCT: 45.4 % (ref 36.0–46.0)
Hemoglobin: 14.6 g/dL (ref 12.0–15.0)
Immature Granulocytes: 0 %
Lymphocytes Relative: 37 %
Lymphs Abs: 3 10*3/uL (ref 0.7–4.0)
MCH: 30 pg (ref 26.0–34.0)
MCHC: 32.2 g/dL (ref 30.0–36.0)
MCV: 93.2 fL (ref 80.0–100.0)
Monocytes Absolute: 0.6 10*3/uL (ref 0.1–1.0)
Monocytes Relative: 7 %
Neutro Abs: 4.2 10*3/uL (ref 1.7–7.7)
Neutrophils Relative %: 53 %
Platelets: 196 10*3/uL (ref 150–400)
RBC: 4.87 MIL/uL (ref 3.87–5.11)
RDW: 14.1 % (ref 11.5–15.5)
WBC: 8 10*3/uL (ref 4.0–10.5)
nRBC: 0 % (ref 0.0–0.2)

## 2023-01-31 LAB — BASIC METABOLIC PANEL
Anion gap: 10 (ref 5–15)
BUN: 12 mg/dL (ref 6–20)
CO2: 26 mmol/L (ref 22–32)
Calcium: 9.1 mg/dL (ref 8.9–10.3)
Chloride: 102 mmol/L (ref 98–111)
Creatinine, Ser: 0.84 mg/dL (ref 0.44–1.00)
GFR, Estimated: >60 mL/min (ref >60)
Glucose, Bld: 151 mg/dL — ABNORMAL HIGH (ref 70–99)
Potassium: 3.8 mmol/L (ref 3.5–5.1)
Sodium: 138 mmol/L (ref 135–145)

## 2023-01-31 LAB — URINALYSIS, ROUTINE W REFLEX MICROSCOPIC
Bilirubin Urine: NEGATIVE
Glucose, UA: NEGATIVE mg/dL
Hgb urine dipstick: NEGATIVE
Ketones, ur: NEGATIVE mg/dL
Nitrite: NEGATIVE
Protein, ur: NEGATIVE mg/dL
Specific Gravity, Urine: 1.016 (ref 1.005–1.030)
pH: 5 (ref 5.0–8.0)

## 2023-01-31 LAB — LIPASE, BLOOD: Lipase: 42 U/L (ref 11–51)

## 2023-01-31 LAB — PREGNANCY, URINE: Preg Test, Ur: NEGATIVE

## 2023-01-31 MED ORDER — OMEPRAZOLE 20 MG PO CPDR: 20.0000 mg | DELAYED_RELEASE_CAPSULE | Freq: Every day | ORAL | 0 refills | Status: DC

## 2023-01-31 MED ORDER — ONDANSETRON HCL 4 MG/2ML IJ SOLN
4.0000 mg | Freq: Once | INTRAMUSCULAR | Status: AC
  Administered 2023-01-31: 4 mg via INTRAVENOUS
  Filled 2023-01-31: qty 2

## 2023-01-31 MED ORDER — DICYCLOMINE HCL 20 MG PO TABS: 20.0000 mg | ORAL_TABLET | Freq: Two times a day (BID) | ORAL | 0 refills | Status: DC

## 2023-01-31 MED ORDER — MORPHINE SULFATE (PF) 4 MG/ML IV SOLN
6.0000 mg | Freq: Once | INTRAVENOUS | Status: AC
  Administered 2023-01-31: 6 mg via INTRAVENOUS
  Filled 2023-01-31: qty 2

## 2023-01-31 MED ORDER — OXYCODONE HCL 5 MG PO TABS
5.0000 mg | ORAL_TABLET | Freq: Once | ORAL | Status: AC
  Administered 2023-01-31: 5 mg via ORAL
  Filled 2023-01-31: qty 1

## 2023-01-31 MED ORDER — LACTATED RINGERS IV BOLUS
1000.0000 mL | Freq: Once | INTRAVENOUS | Status: AC
  Administered 2023-01-31: 1000 mL via INTRAVENOUS

## 2023-01-31 MED ORDER — IOHEXOL 300 MG/ML  SOLN
100.0000 mL | Freq: Once | INTRAMUSCULAR | Status: AC | PRN
  Administered 2023-01-31: 100 mL via INTRAVENOUS

## 2023-01-31 MED ORDER — ACETAMINOPHEN 500 MG PO TABS
1000.0000 mg | ORAL_TABLET | Freq: Once | ORAL | Status: AC
  Administered 2023-01-31: 1000 mg via ORAL
  Filled 2023-01-31: qty 2
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
## 2023-01-31 NOTE — ED Provider Notes (Signed)
[facility] Provider Note   CSN: [id number] Arrival date & time: 01/31/23  1943     History Chief Complaint  Patient presents with   Abdominal Pain    HPI Cathy Hall is a 52 y.o. female presenting for a multitude of complaints.  She is endorsing nausea vomiting, abdominal pain, flank pain, myalgias, fatigue all of which is been present for 2 months but seems worse tonight.  Otherwise ambulatory tolerating p.o. intake.  Follow-up with a PCP from out of town and has not informed them of her symptoms over the past 2 months.  States she has a history of chronic abdominal pain a year ago where she actually ingested insecticide and had gastritis for months afterwards needing a long course of omeprazole.  States that her pain feels more severe today than it did back then.  Otherwise ambulatory tolerating p.o. intake..   Patient's recorded medical, surgical, social, medication list and allergies were reviewed in the Snapshot window as part of the initial history.   Review of Systems   Review of Systems  Constitutional:  Positive for appetite change and fatigue. Negative for chills and fever.  HENT:  Negative for ear pain and sore throat.   Eyes:  Negative for pain and visual disturbance.  Respiratory:  Negative for cough and shortness of breath.   Cardiovascular:  Negative for chest pain and palpitations.  Gastrointestinal:  Positive for abdominal pain and nausea. Negative for vomiting.  Genitourinary:  Positive for decreased urine volume and flank pain. Negative for dysuria and hematuria.  Musculoskeletal:  Positive for myalgias. Negative for arthralgias and back pain.  Skin:  Negative for color change and rash.  Neurological:  Negative for seizures and syncope.  All other systems reviewed and are negative.   Physical Exam Updated Vital Signs BP (!) 160/130   Pulse 91   Temp 98.4 F (36.9 C) (Oral)   Resp 16   Ht 5\' 1"   (1.549 m)   Wt 112 kg   SpO2 94%   BMI 46.67 kg/m  Physical Exam Vitals and nursing note reviewed.  Constitutional:      General: She is not in acute distress.    Appearance: She is well-developed.  HENT:     Head: Normocephalic and atraumatic.  Eyes:     Conjunctiva/sclera: Conjunctivae normal.  Cardiovascular:     Rate and Rhythm: Normal rate and regular rhythm.     Heart sounds: No murmur heard. Pulmonary:     Effort: Pulmonary effort is normal. No respiratory distress.     Breath sounds: Normal breath sounds.  Abdominal:     General: There is no distension.     Palpations: Abdomen is soft.     Tenderness: There is generalized abdominal tenderness. There is no right CVA tenderness, left CVA tenderness or guarding. Negative signs include Murphy's sign.  Musculoskeletal:        General: No swelling or tenderness. Normal range of motion.     Cervical back: Neck supple.  Skin:    General: Skin is warm and dry.  Neurological:     General: No focal deficit present.     Mental Status: She is alert and oriented to person, place, and time. Mental status is at baseline.     Cranial Nerves: No cranial nerve deficit.      ED Course/ Medical Decision Making/ A&P    Procedures Procedures   Medications Ordered in ED Medications  oxyCODONE (Oxy  IR/ROXICODONE) immediate release tablet 5 mg (has no administration in time range)  lactated ringers bolus 1,000 mL (1,000 mLs Intravenous New Bag/Given 01/31/23 2023)  acetaminophen (TYLENOL) tablet 1,000 mg (1,000 mg Oral Given 01/31/23 2023)  morphine (PF) 4 MG/ML injection 6 mg (6 mg Intravenous Given 01/31/23 2024)  ondansetron (ZOFRAN) injection 4 mg (4 mg Intravenous Given 01/31/23 2024)  iohexol (OMNIPAQUE) 300 MG/ML solution 100 mL (100 mLs Intravenous Contrast Given 01/31/23 2257)   Medical Decision Making:   Cathy Hall is a 52 y.o. female who presented to the ED today with abdominal pain, detailed above.     Patient's presentation is complicated by their history of multiple comorbid medical problems.  Patient placed on continuous vitals and telemetry monitoring while in ED which was reviewed periodically.  Complete initial physical exam performed, notably the patient  was hemodynamically stable in no acute distress.     Reviewed and confirmed nursing documentation for past medical history, family history, social history.    Initial Assessment:   With the patient's presentation of abdominal pain, most likely diagnosis is nonspecific etiology. Other diagnoses were considered including (but not limited to) gastroenteritis, colitis, small bowel obstruction, appendicitis, cholecystitis, pancreatitis, nephrolithiasis, UTI, pyleonephritis, ruptured ectopic pregnancy, PID, ovarian torsion. These are considered less likely due to history of present illness and physical exam findings.   This is most consistent with an acute life/limb threatening illness complicated by underlying chronic conditions.   Initial Plan:  CBC/CMP to evaluate for underlying infectious/metabolic etiology for patient's abdominal pain  Lipase to evaluate for pancreatitis  EKG to evaluate for cardiac source of pain  CTAB/Pelvis with contrast to evaluate for structural/surgical etiology of patients' severe abdominal pain.  Urinalysis and repeat physical assessment to evaluate for UTI/Pyelonpehritis  Empiric management of symptoms with escalating pain control and antiemetics as needed.   Initial Study Results:   Laboratory  All laboratory results reviewed without evidence of clinically relevant pathology.   EKG EKG was reviewed independently. Rate, rhythm, axis, intervals all examined and without medically relevant abnormality. ST segments without concerns for elevations.    Radiology All images reviewed independently. Agree with radiology report at this time.   CT ABDOMEN PELVIS W CONTRAST  Result Date: 01/31/2023 CLINICAL DATA:   Acute abdominal pain. EXAM: CT ABDOMEN AND PELVIS WITH CONTRAST TECHNIQUE: Multidetector CT imaging of the abdomen and pelvis was performed using the standard protocol following bolus administration of intravenous contrast. RADIATION DOSE REDUCTION: This exam was performed according to the departmental dose-optimization program which includes automated exposure control, adjustment of the mA and/or kV according to patient size and/or use of iterative reconstruction technique. CONTRAST:  15mL OMNIPAQUE IOHEXOL 300 MG/ML  SOLN COMPARISON:  CT abdomen and pelvis 10/22/2019 FINDINGS: Lower chest: No acute abnormality. Hepatobiliary: There is diffuse fatty infiltration of the liver. Of the liver is enlarged. Gallbladder and bile ducts are within normal limits. There are subcentimeter cysts are hemangiomas in the left lobe, unchanged. Pancreas: Unremarkable. No pancreatic ductal dilatation or surrounding inflammatory changes. Spleen: Normal in size without focal abnormality. Adrenals/Urinary Tract: Adrenal glands are unremarkable. Kidneys are normal, without renal calculi, focal lesion, or hydronephrosis. Bladder is unremarkable. Stomach/Bowel: Stomach is within normal limits. Appendix appears normal. No evidence of bowel wall thickening, distention, or inflammatory changes. There is sigmoid and descending colon diverticulosis. Vascular/Lymphatic: Aortic atherosclerosis. No enlarged abdominal or pelvic lymph nodes. Reproductive: IUD is again seen in the uterus. Multiple uterine fibroids are identified measuring up to 5.5 cm. Ovaries  are within normal limits. Other: No abdominal wall hernia or abnormality. No abdominopelvic ascites. Musculoskeletal: No fracture is seen. IMPRESSION: 1. No acute localizing process in the abdomen or pelvis. 2. Hepatomegaly with fatty infiltration of the liver. 3. Fibroid uterus. 4. Sigmoid and descending colon diverticulitis. Aortic Atherosclerosis (ICD10-I70.0). Electronically Signed   By:  Ronney Asters M.D.   On: 01/31/2023 23:19     Final Reassessment and Plan:   On reassessment, pain is well under control.  Vital signs within normal limits.  CT scan with no focal pathology identified.  Given resolution of pain, well appearance: Patient is stable for outpatient care and management.  Will restart patient on PPI/Bentyl and have patient follow-up with primary care provider with plan for gastroenterologic outpatient follow-up per PCP.  She also request an orthopedic referral for her bilateral knee pain.  Disposition:  I have considered need for hospitalization, however, considering all of the above, I believe this patient is stable for discharge at this time.  Patient/family educated about specific return precautions for given chief complaint and symptoms.  Patient/family educated about follow-up with PCP.     Patient/family expressed understanding of return precautions and need for follow-up. Patient spoken to regarding all imaging and laboratory results and appropriate follow up for these results. All education provided in verbal form with additional information in written form. Time was allowed for answering of patient questions. Patient discharged.    Emergency Department Medication Summary:   Medications  oxyCODONE (Oxy IR/ROXICODONE) immediate release tablet 5 mg (has no administration in time range)  lactated ringers bolus 1,000 mL (1,000 mLs Intravenous New Bag/Given 01/31/23 2023)  acetaminophen (TYLENOL) tablet 1,000 mg (1,000 mg Oral Given 01/31/23 2023)  morphine (PF) 4 MG/ML injection 6 mg (6 mg Intravenous Given 01/31/23 2024)  ondansetron (ZOFRAN) injection 4 mg (4 mg Intravenous Given 01/31/23 2024)  iohexol (OMNIPAQUE) 300 MG/ML solution 100 mL (100 mLs Intravenous Contrast Given 01/31/23 2257)            Clinical Impression:  1. Generalized abdominal pain      Discharge   Final Clinical Impression(s) / ED Diagnoses Final diagnoses:  Generalized  abdominal pain    Rx / DC Orders ED Discharge Orders          Ordered    omeprazole (PRILOSEC) 20 MG capsule  Daily        01/31/23 2323    dicyclomine (BENTYL) 20 MG tablet  2 times daily        01/31/23 2323              Tretha Sciara, MD 01/31/23 2323

## 2023-04-10 ENCOUNTER — Emergency Department (HOSPITAL_COMMUNITY): Payer: Medicaid Other

## 2023-04-10 ENCOUNTER — Inpatient Hospital Stay (HOSPITAL_COMMUNITY)
Admission: EM | Admit: 2023-04-10 | Discharge: 2023-04-21 | DRG: 193 | Disposition: A | Discharge status: Home Health | Payer: Medicaid Other | Attending: Internal Medicine | Admitting: Internal Medicine

## 2023-04-10 ENCOUNTER — Other Ambulatory Visit: Payer: Self-pay

## 2023-04-10 ENCOUNTER — Encounter (HOSPITAL_COMMUNITY): Payer: Self-pay | Admitting: Emergency Medicine

## 2023-04-10 DIAGNOSIS — I10 Essential (primary) hypertension: Secondary | ICD-10-CM | POA: Diagnosis present

## 2023-04-10 DIAGNOSIS — I110 Hypertensive heart disease with heart failure: Secondary | ICD-10-CM | POA: Diagnosis present

## 2023-04-10 DIAGNOSIS — Z6841 Body Mass Index (BMI) 40.0 and over, adult: Secondary | ICD-10-CM | POA: Diagnosis present

## 2023-04-10 DIAGNOSIS — Z7951 Long term (current) use of inhaled steroids: Secondary | ICD-10-CM

## 2023-04-10 DIAGNOSIS — J122 Parainfluenza virus pneumonia: Principal | ICD-10-CM

## 2023-04-10 DIAGNOSIS — I739 Peripheral vascular disease, unspecified: Secondary | ICD-10-CM | POA: Diagnosis present

## 2023-04-10 DIAGNOSIS — J9621 Acute and chronic respiratory failure with hypoxia: Secondary | ICD-10-CM

## 2023-04-10 DIAGNOSIS — T380X5A Adverse effect of glucocorticoids and synthetic analogues, initial encounter: Secondary | ICD-10-CM | POA: Diagnosis present

## 2023-04-10 DIAGNOSIS — Z7984 Long term (current) use of oral hypoglycemic drugs: Secondary | ICD-10-CM

## 2023-04-10 DIAGNOSIS — R7989 Other specified abnormal findings of blood chemistry: Secondary | ICD-10-CM | POA: Diagnosis present

## 2023-04-10 DIAGNOSIS — I5033 Acute on chronic diastolic (congestive) heart failure: Secondary | ICD-10-CM | POA: Insufficient documentation

## 2023-04-10 DIAGNOSIS — E1151 Type 2 diabetes mellitus with diabetic peripheral angiopathy without gangrene: Secondary | ICD-10-CM | POA: Diagnosis present

## 2023-04-10 DIAGNOSIS — K219 Gastro-esophageal reflux disease without esophagitis: Secondary | ICD-10-CM | POA: Diagnosis present

## 2023-04-10 DIAGNOSIS — Z8673 Personal history of transient ischemic attack (TIA), and cerebral infarction without residual deficits: Secondary | ICD-10-CM

## 2023-04-10 DIAGNOSIS — T501X6A Underdosing of loop [high-ceiling] diuretics, initial encounter: Secondary | ICD-10-CM | POA: Diagnosis present

## 2023-04-10 DIAGNOSIS — Z79899 Other long term (current) drug therapy: Secondary | ICD-10-CM

## 2023-04-10 DIAGNOSIS — Z886 Allergy status to analgesic agent status: Secondary | ICD-10-CM

## 2023-04-10 DIAGNOSIS — J449 Chronic obstructive pulmonary disease, unspecified: Secondary | ICD-10-CM | POA: Diagnosis present

## 2023-04-10 DIAGNOSIS — Z91120 Patient's intentional underdosing of medication regimen due to financial hardship: Secondary | ICD-10-CM

## 2023-04-10 DIAGNOSIS — F32A Depression, unspecified: Secondary | ICD-10-CM | POA: Diagnosis present

## 2023-04-10 DIAGNOSIS — E66813 Obesity, class 3: Secondary | ICD-10-CM | POA: Diagnosis present

## 2023-04-10 DIAGNOSIS — Z1152 Encounter for screening for COVID-19: Secondary | ICD-10-CM

## 2023-04-10 DIAGNOSIS — J441 Chronic obstructive pulmonary disease with (acute) exacerbation: Secondary | ICD-10-CM | POA: Diagnosis present

## 2023-04-10 DIAGNOSIS — E662 Morbid (severe) obesity with alveolar hypoventilation: Secondary | ICD-10-CM | POA: Diagnosis present

## 2023-04-10 DIAGNOSIS — Z794 Long term (current) use of insulin: Secondary | ICD-10-CM

## 2023-04-10 DIAGNOSIS — F17210 Nicotine dependence, cigarettes, uncomplicated: Secondary | ICD-10-CM | POA: Diagnosis present

## 2023-04-10 DIAGNOSIS — J209 Acute bronchitis, unspecified: Secondary | ICD-10-CM | POA: Diagnosis present

## 2023-04-10 DIAGNOSIS — E1142 Type 2 diabetes mellitus with diabetic polyneuropathy: Secondary | ICD-10-CM | POA: Diagnosis present

## 2023-04-10 DIAGNOSIS — E1165 Type 2 diabetes mellitus with hyperglycemia: Secondary | ICD-10-CM | POA: Diagnosis present

## 2023-04-10 DIAGNOSIS — N179 Acute kidney failure, unspecified: Secondary | ICD-10-CM | POA: Diagnosis not present

## 2023-04-10 DIAGNOSIS — E875 Hyperkalemia: Secondary | ICD-10-CM | POA: Diagnosis present

## 2023-04-10 DIAGNOSIS — J9622 Acute and chronic respiratory failure with hypercapnia: Secondary | ICD-10-CM | POA: Diagnosis present

## 2023-04-10 DIAGNOSIS — G894 Chronic pain syndrome: Secondary | ICD-10-CM | POA: Diagnosis present

## 2023-04-10 DIAGNOSIS — Z597 Insufficient social insurance and welfare support: Secondary | ICD-10-CM

## 2023-04-10 DIAGNOSIS — G9341 Metabolic encephalopathy: Secondary | ICD-10-CM | POA: Diagnosis present

## 2023-04-10 DIAGNOSIS — F4310 Post-traumatic stress disorder, unspecified: Secondary | ICD-10-CM | POA: Diagnosis present

## 2023-04-10 DIAGNOSIS — E785 Hyperlipidemia, unspecified: Secondary | ICD-10-CM | POA: Diagnosis present

## 2023-04-10 DIAGNOSIS — F40240 Claustrophobia: Secondary | ICD-10-CM | POA: Diagnosis present

## 2023-04-10 DIAGNOSIS — F1910 Other psychoactive substance abuse, uncomplicated: Secondary | ICD-10-CM | POA: Diagnosis present

## 2023-04-10 DIAGNOSIS — J45901 Unspecified asthma with (acute) exacerbation: Secondary | ICD-10-CM | POA: Diagnosis present

## 2023-04-10 DIAGNOSIS — Z833 Family history of diabetes mellitus: Secondary | ICD-10-CM

## 2023-04-10 DIAGNOSIS — T501X5A Adverse effect of loop [high-ceiling] diuretics, initial encounter: Secondary | ICD-10-CM | POA: Diagnosis not present

## 2023-04-10 DIAGNOSIS — Z5320 Procedure and treatment not carried out because of patient's decision for unspecified reasons: Secondary | ICD-10-CM | POA: Diagnosis not present

## 2023-04-10 DIAGNOSIS — J440 Chronic obstructive pulmonary disease with acute lower respiratory infection: Secondary | ICD-10-CM | POA: Diagnosis present

## 2023-04-10 DIAGNOSIS — J189 Pneumonia, unspecified organism: Secondary | ICD-10-CM | POA: Diagnosis present

## 2023-04-10 LAB — CBG MONITORING, ED
Glucose-Capillary: 165 mg/dL — ABNORMAL HIGH (ref 70–99)
Glucose-Capillary: 165 mg/dL — ABNORMAL HIGH (ref 70–99)

## 2023-04-10 LAB — RESP PANEL BY RT-PCR (RSV, FLU A&B, COVID)  RVPGX2
Influenza A by PCR: NEGATIVE
Influenza B by PCR: NEGATIVE
Resp Syncytial Virus by PCR: NEGATIVE
SARS Coronavirus 2 by RT PCR: NEGATIVE

## 2023-04-10 LAB — BASIC METABOLIC PANEL
Anion gap: 14 (ref 5–15)
BUN: 12 mg/dL (ref 6–20)
CO2: 23 mmol/L (ref 22–32)
Calcium: 9.3 mg/dL (ref 8.9–10.3)
Chloride: 101 mmol/L (ref 98–111)
Creatinine, Ser: 0.96 mg/dL (ref 0.44–1.00)
GFR, Estimated: >60 mL/min (ref >60)
Glucose, Bld: 172 mg/dL — ABNORMAL HIGH (ref 70–99)
Potassium: 3.9 mmol/L (ref 3.5–5.1)
Sodium: 138 mmol/L (ref 135–145)

## 2023-04-10 LAB — CBC
HCT: 41.3 % (ref 36.0–46.0)
Hemoglobin: 13.4 g/dL (ref 12.0–15.0)
MCH: 29.7 pg (ref 26.0–34.0)
MCHC: 32.4 g/dL (ref 30.0–36.0)
MCV: 91.6 fL (ref 80.0–100.0)
Platelets: 302 10*3/uL (ref 150–400)
RBC: 4.51 MIL/uL (ref 3.87–5.11)
RDW: 13.9 % (ref 11.5–15.5)
WBC: 10.6 10*3/uL — ABNORMAL HIGH (ref 4.0–10.5)
nRBC: 0.2 % (ref 0.0–0.2)

## 2023-04-10 LAB — TROPONIN I (HIGH SENSITIVITY)
Troponin I (High Sensitivity): 24 ng/L — ABNORMAL HIGH (ref <18)
Troponin I (High Sensitivity): 22 ng/L — ABNORMAL HIGH (ref <18)

## 2023-04-10 LAB — BRAIN NATRIURETIC PEPTIDE: B Natriuretic Peptide: 74.8 pg/mL (ref 0.0–100.0)

## 2023-04-10 LAB — I-STAT BETA HCG BLOOD, ED (MC, WL, AP ONLY): I-stat hCG, quantitative: <5 m[IU]/mL (ref <5)

## 2023-04-10 MED ORDER — FENTANYL CITRATE PF 50 MCG/ML IJ SOSY
50.0000 ug | PREFILLED_SYRINGE | Freq: Once | INTRAMUSCULAR | Status: AC
  Administered 2023-04-10: 50 ug via INTRAVENOUS
  Filled 2023-04-10: qty 1

## 2023-04-10 MED ORDER — FUROSEMIDE 10 MG/ML IJ SOLN
20.0000 mg | Freq: Once | INTRAMUSCULAR | Status: AC
  Administered 2023-04-10: 20 mg via INTRAVENOUS
  Filled 2023-04-10: qty 2

## 2023-04-10 NOTE — ED Triage Notes (Addendum)
Pt in with cough, congestion and hyperglycemia. Hx of COPD and asthma, states she feels like she has fluid on her lungs and was dx with CHF back in [place] a month ago. Has been w/o Lasix x 5 days, and recently taken off of her HCTZ. Pt sob, c/o central cp. Frequently travels to [place], lives in [place]. Also states she has been without Ozempic x 1.5wks

## 2023-04-11 ENCOUNTER — Emergency Department (HOSPITAL_COMMUNITY): Payer: Medicaid Other

## 2023-04-11 DIAGNOSIS — I5033 Acute on chronic diastolic (congestive) heart failure: Secondary | ICD-10-CM | POA: Insufficient documentation

## 2023-04-11 DIAGNOSIS — J122 Parainfluenza virus pneumonia: Secondary | ICD-10-CM | POA: Diagnosis not present

## 2023-04-11 DIAGNOSIS — J189 Pneumonia, unspecified organism: Secondary | ICD-10-CM | POA: Diagnosis present

## 2023-04-11 LAB — RAPID URINE DRUG SCREEN, HOSP PERFORMED
Amphetamines: NOT DETECTED
Barbiturates: NOT DETECTED
Benzodiazepines: NOT DETECTED
Cocaine: NOT DETECTED
Opiates: POSITIVE — AB
Tetrahydrocannabinol: POSITIVE — AB

## 2023-04-11 LAB — RESPIRATORY PANEL BY PCR

## 2023-04-11 LAB — CBC
HCT: 39.8 % (ref 36.0–46.0)
Hemoglobin: 12.8 g/dL (ref 12.0–15.0)
MCH: 29.6 pg (ref 26.0–34.0)
MCHC: 32.2 g/dL (ref 30.0–36.0)
MCV: 91.9 fL (ref 80.0–100.0)
Platelets: 280 10*3/uL (ref 150–400)
RBC: 4.33 MIL/uL (ref 3.87–5.11)
RDW: 14 % (ref 11.5–15.5)
WBC: 10.6 10*3/uL — ABNORMAL HIGH (ref 4.0–10.5)
nRBC: 0 % (ref 0.0–0.2)

## 2023-04-11 LAB — HIV ANTIBODY (ROUTINE TESTING W REFLEX): HIV Screen 4th Generation wRfx: NONREACTIVE

## 2023-04-11 LAB — BLOOD GAS, ARTERIAL
Acid-Base Excess: 5.2 mmol/L — ABNORMAL HIGH (ref 0.0–2.0)
Bicarbonate: 31.5 mmol/L — ABNORMAL HIGH (ref 20.0–28.0)
O2 Saturation: 94.5 %
Patient temperature: 36.3
pCO2 arterial: 51 mmHg — ABNORMAL HIGH (ref 32–48)
pH, Arterial: 7.4 (ref 7.35–7.45)
pO2, Arterial: 63 mmHg — ABNORMAL LOW (ref 83–108)

## 2023-04-11 LAB — CREATININE, SERUM
Creatinine, Ser: 1.01 mg/dL — ABNORMAL HIGH (ref 0.44–1.00)
GFR, Estimated: >60 mL/min (ref >60)

## 2023-04-11 LAB — GLUCOSE, CAPILLARY
Glucose-Capillary: 190 mg/dL — ABNORMAL HIGH (ref 70–99)
Glucose-Capillary: 186 mg/dL — ABNORMAL HIGH (ref 70–99)
Glucose-Capillary: 195 mg/dL — ABNORMAL HIGH (ref 70–99)
Glucose-Capillary: 200 mg/dL — ABNORMAL HIGH (ref 70–99)

## 2023-04-11 LAB — C-REACTIVE PROTEIN: CRP: 5.5 mg/dL — ABNORMAL HIGH (ref <1.0)

## 2023-04-11 LAB — PROCALCITONIN: Procalcitonin: <0.1 ng/mL

## 2023-04-11 MED ORDER — BUDESONIDE 0.25 MG/2ML IN SUSP
0.2500 mg | Freq: Two times a day (BID) | RESPIRATORY_TRACT | Status: DC
  Administered 2023-04-11 – 2023-04-21 (×21): 0.25 mg via RESPIRATORY_TRACT
  Filled 2023-04-11 (×22): qty 2

## 2023-04-11 MED ORDER — SODIUM CHLORIDE 0.9 % IV SOLN
1.0000 g | Freq: Once | INTRAVENOUS | Status: AC
  Administered 2023-04-11: 1 g via INTRAVENOUS
  Filled 2023-04-11: qty 10

## 2023-04-11 MED ORDER — PREGABALIN 25 MG PO CAPS
25.0000 mg | ORAL_CAPSULE | Freq: Two times a day (BID) | ORAL | Status: DC
  Administered 2023-04-11 (×2): 25 mg via ORAL
  Filled 2023-04-11 (×3): qty 1

## 2023-04-11 MED ORDER — ATORVASTATIN CALCIUM 40 MG PO TABS
40.0000 mg | ORAL_TABLET | Freq: Every day | ORAL | Status: DC
  Administered 2023-04-11 – 2023-04-21 (×11): 40 mg via ORAL
  Filled 2023-04-11 (×11): qty 1

## 2023-04-11 MED ORDER — IRBESARTAN 75 MG PO TABS
75.0000 mg | ORAL_TABLET | Freq: Every day | ORAL | Status: DC
  Administered 2023-04-12 – 2023-04-20 (×9): 75 mg via ORAL
  Filled 2023-04-11 (×10): qty 1

## 2023-04-11 MED ORDER — FUROSEMIDE 10 MG/ML IJ SOLN
40.0000 mg | Freq: Two times a day (BID) | INTRAMUSCULAR | Status: DC
  Administered 2023-04-11 (×2): 40 mg via INTRAVENOUS
  Filled 2023-04-11 (×3): qty 4

## 2023-04-11 MED ORDER — SODIUM CHLORIDE 0.9 % IV SOLN
1.0000 g | INTRAVENOUS | Status: DC
  Administered 2023-04-12: 1 g via INTRAVENOUS
  Filled 2023-04-11: qty 10

## 2023-04-11 MED ORDER — INSULIN ASPART 100 UNIT/ML IJ SOLN
0.0000 [IU] | Freq: Every day | INTRAMUSCULAR | Status: DC
  Administered 2023-04-12: 2 [IU] via SUBCUTANEOUS
  Administered 2023-04-13: 5 [IU] via SUBCUTANEOUS
  Administered 2023-04-14: 4 [IU] via SUBCUTANEOUS
  Administered 2023-04-18: 2 [IU] via SUBCUTANEOUS
  Administered 2023-04-20: 3 [IU] via SUBCUTANEOUS

## 2023-04-11 MED ORDER — INSULIN DEGLUDEC FLEXTOUCH 100 UNIT/ML ~~LOC~~ SOPN: 20.0000 [IU] | PEN_INJECTOR | Freq: Every day | SUBCUTANEOUS | 0 refills | Status: DC

## 2023-04-11 MED ORDER — ENOXAPARIN SODIUM 40 MG/0.4ML IJ SOSY
40.0000 mg | PREFILLED_SYRINGE | INTRAMUSCULAR | Status: DC
  Administered 2023-04-11 – 2023-04-21 (×11): 40 mg via SUBCUTANEOUS
  Filled 2023-04-11 (×11): qty 0.4

## 2023-04-11 MED ORDER — IOHEXOL 350 MG/ML SOLN
150.0000 mL | Freq: Once | INTRAVENOUS | Status: AC | PRN
  Administered 2023-04-11: 150 mL via INTRAVENOUS

## 2023-04-11 MED ORDER — INSULIN GLARGINE-YFGN 100 UNIT/ML ~~LOC~~ SOLN
20.0000 [IU] | Freq: Every day | SUBCUTANEOUS | Status: DC
  Administered 2023-04-11 – 2023-04-12 (×2): 20 [IU] via SUBCUTANEOUS
  Filled 2023-04-11 (×5): qty 0.2

## 2023-04-11 MED ORDER — TRAZODONE HCL 50 MG PO TABS
50.0000 mg | ORAL_TABLET | Freq: Every day | ORAL | Status: DC
  Administered 2023-04-11: 50 mg via ORAL
  Filled 2023-04-11: qty 1

## 2023-04-11 MED ORDER — ONDANSETRON HCL 4 MG PO TABS: 4.0000 mg | ORAL_TABLET | Freq: Four times a day (QID) | ORAL | Status: DC | PRN

## 2023-04-11 MED ORDER — AMOXICILLIN-POT CLAVULANATE 875-125 MG PO TABS: 1.0000 | ORAL_TABLET | Freq: Two times a day (BID) | ORAL | 0 refills | Status: DC

## 2023-04-11 MED ORDER — SODIUM CHLORIDE 0.9% FLUSH
3.0000 mL | Freq: Two times a day (BID) | INTRAVENOUS | Status: DC
  Administered 2023-04-11 – 2023-04-21 (×21): 3 mL via INTRAVENOUS

## 2023-04-11 MED ORDER — MOMETASONE FURO-FORMOTEROL FUM 200-5 MCG/ACT IN AERO
2.0000 | INHALATION_SPRAY | Freq: Two times a day (BID) | RESPIRATORY_TRACT | Status: DC
  Administered 2023-04-16 – 2023-04-21 (×10): 2 via RESPIRATORY_TRACT
  Filled 2023-04-11 (×2): qty 8.8

## 2023-04-11 MED ORDER — GABAPENTIN 300 MG PO CAPS: 900.0000 mg | ORAL_CAPSULE | Freq: Three times a day (TID) | ORAL | Status: DC

## 2023-04-11 MED ORDER — FUROSEMIDE 20 MG PO TABS: 20.0000 mg | ORAL_TABLET | Freq: Every day | ORAL | Status: DC

## 2023-04-11 MED ORDER — IPRATROPIUM-ALBUTEROL 0.5-2.5 (3) MG/3ML IN SOLN
3.0000 mL | Freq: Four times a day (QID) | RESPIRATORY_TRACT | Status: DC
  Administered 2023-04-11 (×3): 3 mL via RESPIRATORY_TRACT
  Filled 2023-04-11 (×3): qty 3

## 2023-04-11 MED ORDER — ENSURE ENLIVE PO LIQD
237.0000 mL | Freq: Two times a day (BID) | ORAL | Status: DC
  Administered 2023-04-15 – 2023-04-21 (×8): 237 mL via ORAL

## 2023-04-11 MED ORDER — ONDANSETRON HCL 4 MG/2ML IJ SOLN
4.0000 mg | Freq: Four times a day (QID) | INTRAMUSCULAR | Status: DC | PRN
  Administered 2023-04-15: 4 mg via INTRAVENOUS
  Filled 2023-04-11: qty 2

## 2023-04-11 MED ORDER — LIVING WELL WITH DIABETES BOOK
Freq: Once | Status: AC
  Filled 2023-04-11: qty 1

## 2023-04-11 MED ORDER — MONTELUKAST SODIUM 10 MG PO TABS
10.0000 mg | ORAL_TABLET | Freq: Every day | ORAL | Status: DC
  Administered 2023-04-11 – 2023-04-20 (×9): 10 mg via ORAL
  Filled 2023-04-11 (×9): qty 1

## 2023-04-11 MED ORDER — AZITHROMYCIN 250 MG PO TABS: 250.0000 mg | ORAL_TABLET | Freq: Every day | ORAL | 0 refills | Status: DC

## 2023-04-11 MED ORDER — GUAIFENESIN ER 600 MG PO TB12
600.0000 mg | ORAL_TABLET | Freq: Two times a day (BID) | ORAL | Status: DC
  Administered 2023-04-11 – 2023-04-21 (×20): 600 mg via ORAL
  Filled 2023-04-11 (×20): qty 1

## 2023-04-11 MED ORDER — FLUOXETINE HCL 20 MG PO CAPS
40.0000 mg | ORAL_CAPSULE | Freq: Every day | ORAL | Status: DC
  Administered 2023-04-11 – 2023-04-21 (×11): 40 mg via ORAL
  Filled 2023-04-11 (×11): qty 2

## 2023-04-11 MED ORDER — MORPHINE SULFATE (PF) 4 MG/ML IV SOLN
4.0000 mg | Freq: Once | INTRAVENOUS | Status: AC
  Administered 2023-04-11: 4 mg via INTRAVENOUS
  Filled 2023-04-11: qty 1

## 2023-04-11 MED ORDER — INSULIN ASPART 100 UNIT/ML IJ SOLN
0.0000 [IU] | Freq: Three times a day (TID) | INTRAMUSCULAR | Status: DC
  Administered 2023-04-11 (×2): 4 [IU] via SUBCUTANEOUS
  Administered 2023-04-12: 7 [IU] via SUBCUTANEOUS
  Administered 2023-04-12 (×2): 4 [IU] via SUBCUTANEOUS
  Administered 2023-04-13: 11 [IU] via SUBCUTANEOUS
  Administered 2023-04-13: 20 [IU] via SUBCUTANEOUS
  Administered 2023-04-13: 7 [IU] via SUBCUTANEOUS
  Administered 2023-04-14: 11 [IU] via SUBCUTANEOUS
  Administered 2023-04-14 (×2): 15 [IU] via SUBCUTANEOUS
  Administered 2023-04-15: 4 [IU] via SUBCUTANEOUS
  Administered 2023-04-15: 11 [IU] via SUBCUTANEOUS
  Administered 2023-04-15: 7 [IU] via SUBCUTANEOUS
  Administered 2023-04-16: 3 [IU] via SUBCUTANEOUS
  Administered 2023-04-16: 7 [IU] via SUBCUTANEOUS
  Administered 2023-04-16: 11 [IU] via SUBCUTANEOUS
  Administered 2023-04-17: 7 [IU] via SUBCUTANEOUS
  Administered 2023-04-17: 4 [IU] via SUBCUTANEOUS
  Administered 2023-04-18: 7 [IU] via SUBCUTANEOUS
  Administered 2023-04-18: 3 [IU] via SUBCUTANEOUS
  Administered 2023-04-19 (×3): 4 [IU] via SUBCUTANEOUS

## 2023-04-11 MED ORDER — FUROSEMIDE 20 MG PO TABS: 20.0000 mg | ORAL_TABLET | Freq: Every day | ORAL | 0 refills | Status: DC

## 2023-04-11 MED ORDER — ONDANSETRON HCL 4 MG/2ML IJ SOLN
4.0000 mg | Freq: Once | INTRAMUSCULAR | Status: AC
  Administered 2023-04-11: 4 mg via INTRAVENOUS
  Filled 2023-04-11: qty 2

## 2023-04-11 MED ORDER — ACETAMINOPHEN 650 MG RE SUPP: 650.0000 mg | Freq: Four times a day (QID) | RECTAL | Status: DC | PRN

## 2023-04-11 MED ORDER — ACETAMINOPHEN 325 MG PO TABS
650.0000 mg | ORAL_TABLET | Freq: Four times a day (QID) | ORAL | Status: DC | PRN
  Administered 2023-04-11 – 2023-04-19 (×7): 650 mg via ORAL
  Filled 2023-04-11 (×7): qty 2

## 2023-04-11 MED ORDER — CLOPIDOGREL BISULFATE 75 MG PO TABS
75.0000 mg | ORAL_TABLET | Freq: Every day | ORAL | Status: DC
  Administered 2023-04-11 – 2023-04-21 (×11): 75 mg via ORAL
  Filled 2023-04-11 (×11): qty 1

## 2023-04-11 MED ORDER — INSULIN DEGLUDEC 100 UNIT/ML ~~LOC~~ SOPN
20.0000 [IU] | PEN_INJECTOR | Freq: Every day | SUBCUTANEOUS | Status: DC
  Filled 2023-04-11: qty 3

## 2023-04-11 MED ORDER — PANTOPRAZOLE SODIUM 40 MG PO TBEC
40.0000 mg | DELAYED_RELEASE_TABLET | Freq: Every day | ORAL | Status: DC
  Administered 2023-04-11 – 2023-04-21 (×11): 40 mg via ORAL
  Filled 2023-04-11 (×11): qty 1

## 2023-04-11 MED ORDER — SODIUM CHLORIDE 0.9 % IV SOLN
500.0000 mg | INTRAVENOUS | Status: DC
  Administered 2023-04-12: 500 mg via INTRAVENOUS
  Filled 2023-04-11: qty 5

## 2023-04-11 MED ORDER — SODIUM CHLORIDE 0.9 % IV SOLN
500.0000 mg | Freq: Once | INTRAVENOUS | Status: AC
  Administered 2023-04-11: 500 mg via INTRAVENOUS
  Filled 2023-04-11: qty 5

## 2023-04-11 NOTE — Discharge Instructions (Addendum)
You were evaluated today for shortness of breath.  Your workup revealed a left upper lobe pneumonia.  I have prescribed antibiotics.  Please take as directed until they are complete.  Please follow-up with your primary care for further evaluation and management you will likely need a repeat chest x-ray within the next 4 weeks or so to check for resolution of symptoms.  I have refilled your Evaristo Bury and refilled your Lasix.  Please contact your primary team for further refills as needed.  If you develop any life-threatening symptoms please return to the emergency department for further evaluation

## 2023-04-11 NOTE — Progress Notes (Signed)
This RN was completing patient's admission history questions. Patient was frequently falling asleep mid-conversation. When awake, she was A&Ox4, but she stated that she felt very drowsy and that she did not feel that this was normal for her. This RN notified Dr. Maryfrances Bunnell, and he ordered an ABG.

## 2023-04-11 NOTE — ED Notes (Signed)
ED TO INPATIENT HANDOFF REPORT  ED Nurse Name and Phone #: [phone] Wanda Cellucci Y   S Name/Age/Gender Cathy Hall 51 y.o. female Room/Bed: 039C/039C  Code Status   Code Status: Full Code  Home/SNF/Other Home Patient oriented to: self, place, time, and situation Is this baseline? Yes   Triage Complete: Triage complete  Chief Complaint Left upper lobe pneumonia [J18.9]  Triage Note Pt in with cough, congestion and hyperglycemia. Hx of COPD and asthma, states she feels like she has fluid on her lungs and was dx with CHF back in [place] a month ago. Has been w/o Lasix x 5 days, and recently taken off of her HCTZ. Pt sob, c/o central cp. Frequently travels to [place], lives in [place]. Also states she has been without Ozempic x 1.5wks   Allergies Allergies  Allergen Reactions   Nsaids Other (See Comments)     Kidney Disorder    Level of Care/Admitting Diagnosis ED Disposition     ED Disposition  Admit   Condition  --   Comment  Hospital Area: [facility] [[id number]]  Level of Care: Telemetry Cardiac [[id number]]  May place patient in observation at [facility] or [facility] if equivalent level of care is available:: Yes  Covid Evaluation: Confirmed COVID Negative  Diagnosis: Left upper lobe pneumonia [[id number]]  Admitting Physician: Alberteen Sam [[id number]]  Attending Physician: Alberteen Sam [[id number]]          B Medical/Surgery History Past Medical History:  Diagnosis Date   Anxiety    Asthma    COPD (chronic obstructive pulmonary disease) (HCC)    Depression    Hypertension    Major depression    PTSD (post-traumatic stress disorder)    Past Surgical History:  Procedure Laterality Date   ECTOPIC PREGNANCY SURGERY       A IV Location/Drains/Wounds Patient Lines/Drains/Airways Status     Active Line/Drains/Airways     Name Placement date Placement time Site Days   Peripheral IV 04/10/23 20 G 1" Posterior;Left Hand  04/10/23  2302  Hand  1   Peripheral IV 04/11/23 20 G 1" Anterior;Left;Proximal Forearm 04/11/23  0041  Forearm  less than 1            Intake/Output Last 24 hours  Intake/Output Summary (Last 24 hours) at 04/11/2023 1046 Last data filed at 04/11/2023 0335 Gross per 24 hour  Intake 348.83 ml  Output 800 ml  Net -451.17 ml    Labs/Imaging Results for orders placed or performed during the hospital encounter of 04/10/23 (from the past 48 hour(s))  Resp panel by RT-PCR (RSV, Flu A&B, Covid) Anterior Nasal Swab     Status: None   Collection Time: 04/10/23  9:06 PM   Specimen: Anterior Nasal Swab  Result Value Ref Range   SARS Coronavirus 2 by RT PCR NEGATIVE NEGATIVE   Influenza A by PCR NEGATIVE NEGATIVE   Influenza B by PCR NEGATIVE NEGATIVE    Comment: (NOTE) The Xpert Xpress SARS-CoV-2/FLU/RSV plus assay is intended as an aid in the diagnosis of influenza from Nasopharyngeal swab specimens and should not be used as a sole basis for treatment. Nasal washings and aspirates are unacceptable for Xpert Xpress SARS-CoV-2/FLU/RSV testing.  Fact Sheet for Patients: BloggerCourse.com  Fact Sheet for Healthcare Providers: SeriousBroker.it  This test is not yet approved or cleared by the [place] FDA and has been authorized for detection and/or diagnosis of SARS-CoV-2 by FDA under an Emergency Use Authorization (EUA).  This EUA will remain in effect (meaning this test can be used) for the duration of the COVID-19 declaration under Section 564(b)(1) of the Act, 21 U.S.C. section 360bbb-3(b)(1), unless the authorization is terminated or revoked.     Resp Syncytial Virus by PCR NEGATIVE NEGATIVE    Comment: (NOTE) Fact Sheet for Patients: BloggerCourse.com  Fact Sheet for Healthcare Providers: SeriousBroker.it  This test is not yet approved or cleared by the [place] FDA  and has been authorized for detection and/or diagnosis of SARS-CoV-2 by FDA under an Emergency Use Authorization (EUA). This EUA will remain in effect (meaning this test can be used) for the duration of the COVID-19 declaration under Section 564(b)(1) of the Act, 21 U.S.C. section 360bbb-3(b)(1), unless the authorization is terminated or revoked.  Performed at [facility], [address], [place], [place] [address]   CBG monitoring, ED     Status: Abnormal   Collection Time: 04/10/23  9:11 PM  Result Value Ref Range   Glucose-Capillary 165 (H) 70 - 99 mg/dL    Comment: Glucose reference range applies only to samples taken after fasting for at least 8 hours.  Basic metabolic panel     Status: Abnormal   Collection Time: 04/10/23  9:12 PM  Result Value Ref Range   Sodium 138 135 - 145 mmol/L   Potassium 3.9 3.5 - 5.1 mmol/L   Chloride 101 98 - 111 mmol/L   CO2 23 22 - 32 mmol/L   Glucose, Bld 172 (H) 70 - 99 mg/dL    Comment: Glucose reference range applies only to samples taken after fasting for at least 8 hours.   BUN 12 6 - 20 mg/dL   Creatinine, Ser 9.14 0.44 - 1.00 mg/dL   Calcium 9.3 8.9 - 78.2 mg/dL   GFR, Estimated >95 >62 mL/min    Comment: (NOTE) Calculated using the CKD-EPI Creatinine Equation (2021)    Anion gap 14 5 - 15    Comment: Performed at [facility], [address] [place], [place] [address]  CBC     Status: Abnormal   Collection Time: 04/10/23  9:12 PM  Result Value Ref Range   WBC 10.6 (H) 4.0 - 10.5 K/uL   RBC 4.51 3.87 - 5.11 MIL/uL   Hemoglobin 13.4 12.0 - 15.0 g/dL   HCT 57.8 46.9 - 62.9 %   MCV 91.6 80.0 - 100.0 fL   MCH 29.7 26.0 - 34.0 pg   MCHC 32.4 30.0 - 36.0 g/dL   RDW 52.8 41.3 - 24.4 %   Platelets 302 150 - 400 K/uL   nRBC 0.2 0.0 - 0.2 %    Comment: Performed at [facility], [address] [place], [place] [address]  Troponin I (High Sensitivity)     Status: Abnormal   Collection Time: 04/10/23  9:12 PM   Result Value Ref Range   Troponin I (High Sensitivity) 24 (H) <18 ng/L    Comment: (NOTE) Elevated high sensitivity troponin I (hsTnI) values and significant  changes across serial measurements may suggest ACS but many other  chronic and acute conditions are known to elevate hsTnI results.  Refer to the "Links" section for chest pain algorithms and additional  guidance. Performed at [facility], [address] [place], [place] [address]   Brain natriuretic peptide     Status: None   Collection Time: 04/10/23  9:12 PM  Result Value Ref Range   B Natriuretic Peptide 74.8  0.0 - 100.0 pg/mL    Comment: Performed at [facility], [address] [place], [place] [address]  I-Stat beta hCG blood, ED     Status: None   Collection Time: 04/10/23  9:13 PM  Result Value Ref Range   I-stat hCG, quantitative <5.0 <5 mIU/mL   Comment 3            Comment:   GEST. AGE      CONC.  (mIU/mL)   <=1 WEEK        5 - 50     2 WEEKS       50 - 500     3 WEEKS       100 - 10,000     4 WEEKS     1,000 - 30,000        FEMALE AND NON-PREGNANT FEMALE:     LESS THAN 5 mIU/mL   CBG monitoring, ED     Status: Abnormal   Collection Time: 04/10/23  9:52 PM  Result Value Ref Range   Glucose-Capillary 165 (H) 70 - 99 mg/dL    Comment: Glucose reference range applies only to samples taken after fasting for at least 8 hours.  Troponin I (High Sensitivity)     Status: Abnormal   Collection Time: 04/10/23 11:02 PM  Result Value Ref Range   Troponin I (High Sensitivity) 22 (H) <18 ng/L    Comment: (NOTE) Elevated high sensitivity troponin I (hsTnI) values and significant  changes across serial measurements may suggest ACS but many other  chronic and acute conditions are known to elevate hsTnI results.  Refer to the "Links" section for chest pain algorithms and additional  guidance. Performed at [facility], [address] [place], [place] [address]    CT Angio Chest PE W and/or Wo  Contrast  Result Date: 04/11/2023 CLINICAL DATA:  Cough, congestion, hyperglycemia. History of COPD and asthma. Recently diagnosed with CHF. EXAM: CT ANGIOGRAPHY CHEST WITH CONTRAST TECHNIQUE: Multidetector CT imaging of the chest was performed using the standard protocol during bolus administration of intravenous contrast. Multiplanar CT image reconstructions and MIPs were obtained to evaluate the vascular anatomy. RADIATION DOSE REDUCTION: This exam was performed according to the departmental dose-optimization program which includes automated exposure control, adjustment of the mA and/or kV according to patient size and/or use of iterative reconstruction technique. CONTRAST:  OMNIPAQUE IOHEXOL 350 MG/ML SOLN COMPARISON:  Chest radiograph 04/10/2023 and CT chest 11/28/2020 FINDINGS: Cardiovascular: Satisfactory opacification of the pulmonary arteries to the segmental level. No evidence of pulmonary embolism. Heart size upper limits of normal. No pericardial effusion. Mediastinum/Nodes: No enlarged mediastinal, hilar, or axillary lymph nodes. Thyroid gland, trachea, and esophagus demonstrate no significant findings. Lungs/Pleura: Predominantly expiratory phase scan. Patchy bilateral ground-glass opacities greatest in the left upper lobe. Left lower lobe atelectasis. No pleural effusion or pneumothorax. Upper Abdomen: No acute abnormality. Musculoskeletal: No chest wall abnormality. No acute osseous findings. Review of the MIP images confirms the above findings. IMPRESSION: 1. No evidence of pulmonary embolism. 2. Patchy bilateral ground-glass opacities greatest in the left upper lobe, favor atypical infection versus edema. Electronically Signed   By: Minerva Fester M.D.   On: 04/11/2023 02:29   DG Chest 2 View  Result Date: 04/10/2023 CLINICAL DATA:  Shortness of breath EXAM: CHEST - 2 VIEW COMPARISON:  Chest radiograph dated 11/22/2021 FINDINGS: Normal lung volumes. Bibasilar patchy opacities. No pleural  effusion or pneumothorax. The heart size and mediastinal contours  are within normal limits. No acute osseous abnormality. IMPRESSION: Bibasilar patchy opacities, which may represent atelectasis or infection. Electronically Signed   By: Agustin Cree M.D.   On: 04/10/2023 21:31    Pending Labs Unresulted Labs (From admission, onward)     Start     Ordered   04/18/23 0500  Creatinine, serum  (enoxaparin (LOVENOX)    CrCl >/= 30 ml/min)  Weekly,   R     Comments: while on enoxaparin therapy    04/11/23 0742   04/12/23 0500  Basic metabolic panel  Tomorrow morning,   R        04/11/23 0742   04/12/23 0500  CBC  Tomorrow morning,   R        04/11/23 0742   04/11/23 0817  Hemoglobin A1c  Once,   R       Comments: To assess prior glycemic control    04/11/23 0817   04/11/23 0741  HIV Antibody (routine testing w rflx)  (HIV Antibody (Routine testing w reflex) panel)  Once,   R        04/11/23 0742   04/11/23 0741  CBC  (enoxaparin (LOVENOX)    CrCl >/= 30 ml/min)  Once,   R       Comments: Baseline for enoxaparin therapy IF NOT ALREADY DRAWN.  Notify MD if PLT < 100 K.    04/11/23 0742   04/11/23 0741  Creatinine, serum  (enoxaparin (LOVENOX)    CrCl >/= 30 ml/min)  Once,   R       Comments: Baseline for enoxaparin therapy IF NOT ALREADY DRAWN.    04/11/23 0742   04/11/23 0735  C-reactive protein  Add-on,   AD        04/11/23 0735   04/11/23 0735  Procalcitonin  Add-on,   AD       References:    Procalcitonin Lower Respiratory Tract Infection AND Sepsis Procalcitonin Algorithm   04/11/23 0735   04/11/23 0733  Rapid urine drug screen (hospital performed)  ONCE - STAT,   STAT        04/11/23 0732   04/11/23 0730  Respiratory (~20 pathogens) panel by PCR  (Respiratory panel by PCR (~20 pathogens, ~24 hr TAT)  w precautions)  Once,   URGENT       Comments: Please note this a different panel that the one to check for covid, etc    04/11/23 0730            Vitals/Pain Today's Vitals    04/11/23 0830 04/11/23 0850 04/11/23 1030 04/11/23 1040  BP: (!) 156/98     Pulse: 93  99   Resp: (!) 24  15   Temp:    98.6 F (37 C)  TempSrc:    Oral  SpO2: 97%  100%   Weight:      PainSc:  8       Isolation Precautions Droplet precaution  Medications Medications  ipratropium-albuterol (DUONEB) 0.5-2.5 (3) MG/3ML nebulizer solution 3 mL (3 mLs Nebulization Given 04/11/23 0851)  cefTRIAXone (ROCEPHIN) 1 g in sodium chloride 0.9 % 100 mL IVPB (has no administration in time range)  azithromycin (ZITHROMAX) 500 mg in sodium chloride 0.9 % 250 mL IVPB (has no administration in time range)  irbesartan (AVAPRO) tablet 75 mg (has no administration in time range)  atorvastatin (LIPITOR) tablet 40 mg (has no administration in time range)  clopidogrel (PLAVIX) tablet 75 mg (75 mg Oral Given 04/11/23  0840)  FLUoxetine (PROZAC) capsule 40 mg (has no administration in time range)  mometasone-formoterol (DULERA) 200-5 MCG/ACT inhaler 2 puff (2 puffs Inhalation Not Given 04/11/23 0928)  montelukast (SINGULAIR) tablet 10 mg (has no administration in time range)  pantoprazole (PROTONIX) EC tablet 40 mg (has no administration in time range)  traZODone (DESYREL) tablet 50 mg (has no administration in time range)  enoxaparin (LOVENOX) injection 40 mg (40 mg Subcutaneous Given 04/11/23 0841)  sodium chloride flush (NS) 0.9 % injection 3 mL (3 mLs Intravenous Given 04/11/23 1036)  acetaminophen (TYLENOL) tablet 650 mg (650 mg Oral Given 04/11/23 0849)    Or  acetaminophen (TYLENOL) suppository 650 mg ( Rectal See Alternative 04/11/23 0849)  ondansetron (ZOFRAN) tablet 4 mg (has no administration in time range)    Or  ondansetron (ZOFRAN) injection 4 mg (has no administration in time range)  furosemide (LASIX) injection 40 mg (40 mg Intravenous Given 04/11/23 0840)  pregabalin (LYRICA) capsule 25 mg (has no administration in time range)  guaiFENesin (MUCINEX) 12 hr tablet 600 mg (has no administration in time  range)  budesonide (PULMICORT) nebulizer solution 0.25 mg (0.25 mg Nebulization Given 04/11/23 0841)  insulin aspart (novoLOG) injection 0-20 Units (has no administration in time range)  insulin aspart (novoLOG) injection 0-5 Units (has no administration in time range)  insulin glargine-yfgn (SEMGLEE) injection 20 Units (has no administration in time range)  furosemide (LASIX) injection 20 mg (20 mg Intravenous Given 04/10/23 2329)  fentaNYL (SUBLIMAZE) injection 50 mcg (50 mcg Intravenous Given 04/10/23 2329)  cefTRIAXone (ROCEPHIN) 1 g in sodium chloride 0.9 % 100 mL IVPB (0 g Intravenous Stopped 04/11/23 0215)  azithromycin (ZITHROMAX) 500 mg in sodium chloride 0.9 % 250 mL IVPB (0 mg Intravenous Stopped 04/11/23 0225)  iohexol (OMNIPAQUE) 350 MG/ML injection 150 mL (150 mLs Intravenous Contrast Given 04/11/23 0210)  morphine (PF) 4 MG/ML injection 4 mg (4 mg Intravenous Given 04/11/23 0227)  ondansetron (ZOFRAN) injection 4 mg (4 mg Intravenous Given 04/11/23 0227)    Mobility walks with person assist     Focused Assessments    R Recommendations: See Admitting Provider Note  Report given to:   Additional Notes:

## 2023-04-11 NOTE — ED Provider Notes (Addendum)
[place] EMERGENCY DEPARTMENT AT [facility] Provider Note   CSN: [id number] Arrival date & time: 04/10/23  2045     History  Chief Complaint  Patient presents with   Shortness of Breath   Cough    Cathy Hall is a 52 y.o. female.  Patient presents to the emergency room complaining of cough and congestion which been ongoing for the past 3 to 4 days.  Patient states that she was recently diagnosed with congestive heart failure and has not had Lasix for at least 5 days.  She has also recently had her HCTZ discontinued.  She is currently complaining of shortness of breath and pain with coughing.  The patient also complains of intermittent hyperglycemia due to being out of her Trelegy.  She currently denies abdominal pain, nausea, vomiting, urinary symptoms.  Patient is borderline tachycardic upon my assessment with heart rates in the low 100s.  She is tachypneic with respiratory rates in the mid to upper 20s.  Past medical history significant for asthma, COPD, hypertension, heart failure, GERD  HPI     Home Medications Prior to Admission medications   Medication Sig Start Date End Date Taking? Authorizing Provider  amoxicillin-clavulanate (AUGMENTIN) 875-125 MG tablet Take 1 tablet by mouth every 12 (twelve) hours. 04/11/23  Yes Darrick Grinder, PA-C  azithromycin (ZITHROMAX) 250 MG tablet Take 1 tablet (250 mg total) by mouth daily. Take first 2 tablets together, then 1 every day until finished. 04/11/23  Yes Darrick Grinder, PA-C  furosemide (LASIX) 20 MG tablet Take 1 tablet (20 mg total) by mouth daily. 04/11/23  Yes Darrick Grinder, PA-C  Insulin Degludec FlexTouch 100 UNIT/ML SOPN Inject 20 Units into the skin daily. 04/11/23  Yes Barrie Dunker B, PA-C  albuterol (VENTOLIN HFA) 108 (90 Base) MCG/ACT inhaler INHALE 2 PUFFS INTO THE LUNGS EVERY FOUR HOURS AS NEEDED FOR WHEEZING OR SHORTNESS OF BREATH. Patient taking differently: Inhale 1-2 puffs into the  lungs every 4 (four) hours as needed for wheezing or shortness of breath. 11/30/20 11/30/21  Marguerita Merles Latif, DO  amLODipine (NORVASC) 10 MG tablet Take 10 mg by mouth daily. 01/29/20   [provider]  atorvastatin (LIPITOR) 40 MG tablet TAKE 1 TABLET (40 MG TOTAL) BY MOUTH DAILY. Patient taking differently: Take 40 mg by mouth daily. 11/30/20 11/30/21  Marguerita Merles Latif, DO  dicyclomine (BENTYL) 20 MG tablet Take 1 tablet (20 mg total) by mouth 2 (two) times daily. 01/31/23   Glyn Ade, MD  FLUoxetine (PROZAC) 20 MG capsule Take 1 capsule (20 mg total) by mouth daily. 11/30/21   Bobbye Morton, MD  fluticasone (FLONASE) 50 MCG/ACT nasal spray PLACE 2 SPRAYS INTO BOTH NOSTRILS DAILY. 11/30/20 11/30/21  Marguerita Merles Latif, DO  gabapentin (NEURONTIN) 400 MG capsule Take 1 capsule (400 mg total) by mouth 3 (three) times daily. 11/29/21   Bobbye Morton, MD  loratadine (CLARITIN) 10 MG tablet TAKE 1 TABLET (10 MG TOTAL) BY MOUTH DAILY. Patient taking differently: Take 10 mg by mouth daily. 11/30/20 11/30/21  Marguerita Merles Latif, DO  magic mouthwash SOLN Take 5 mLs by mouth 3 (three) times daily as needed for mouth pain. Shake Well. Swish and swallow. Do not give within 2 hours of Cipro, Avelox, Levaquin or tetracycline. 11/29/21   Bobbye Morton, MD  melatonin 3 MG TABS tablet Take 2 tablets (6 mg total) by mouth at bedtime. 11/29/21   Bobbye Morton, MD  metFORMIN (GLUCOPHAGE) 500  MG tablet Take 1 tablet (500 mg total) by mouth daily with breakfast. 11/30/21   Bobbye Morton, MD  methocarbamol (ROBAXIN) 500 MG tablet Take 1 tablet (500 mg total) by mouth every 6 (six) hours as needed for muscle spasms. 11/29/21   Bobbye Morton, MD  mometasone-formoterol (DULERA) 100-5 MCG/ACT AERO INHALE 2 PUFFS INTO THE LUNGS TWO TIMES DAILY. 11/30/20 11/30/21  Marguerita Merles Latif, DO  montelukast (SINGULAIR) 10 MG tablet TAKE 1 TABLET (10 MG TOTAL) BY MOUTH AT BEDTIME. Patient taking differently: Take 10  mg by mouth at bedtime. 11/30/20 11/30/21  Marguerita Merles Latif, DO  Multiple Vitamin (MULTIVITAMIN WITH MINERALS) TABS tablet Take 1 tablet by mouth daily. 11/30/20   Sheikh, Kateri Mc Latif, DO  nicotine (NICODERM CQ - DOSED IN MG/24 HOURS) 14 mg/24hr patch Place 1 patch (14 mg total) onto the skin daily. 11/30/21   Bobbye Morton, MD  OLANZapine (ZYPREXA) 7.5 MG tablet Take 1 tablet (7.5 mg total) by mouth at bedtime. 11/29/21   Bobbye Morton, MD  omeprazole (PRILOSEC) 20 MG capsule Take 1 capsule (20 mg total) by mouth daily. 01/31/23   Glyn Ade, MD  pantoprazole (PROTONIX) 40 MG tablet TAKE 1 TABLET (40 MG TOTAL) BY MOUTH DAILY. Patient taking differently: Take 40 mg by mouth daily. 11/30/20 11/30/21  Marguerita Merles Latif, DO  polyvinyl alcohol (LIQUIFILM TEARS) 1.4 % ophthalmic solution Place 1 drop into both eyes daily as needed for dry eyes. 11/30/20   Marguerita Merles Latif, DO  sodium chloride (OCEAN) 0.65 % nasal spray PLACE 1 SPRAY INTO BOTH NOSTRILS AS NEEDED FOR CONGESTION. Patient taking differently: Place 1 spray into the nose daily as needed for congestion. 11/30/20 11/30/21  Marguerita Merles Latif, DO  sodium chloride (OCEAN) 0.65 % SOLN nasal spray Place 1 spray into both nostrils as needed for congestion. Patient not taking: Reported on 11/20/2021 11/30/20   Marguerita Merles Latif, DO  traZODone (DESYREL) 50 MG tablet Take 1 tablet (50 mg total) by mouth at bedtime. 11/29/21   Bobbye Morton, MD      Allergies    Nsaids    Review of Systems   Review of Systems  Physical Exam Updated Vital Signs BP (!) 151/77   Pulse 79   Temp 98.7 F (37.1 C) (Oral)   Resp (!) 23   Wt 122.5 kg   SpO2 95%   BMI 51.02 kg/m  Physical Exam Vitals and nursing note reviewed.  Constitutional:      General: She is not in acute distress.    Appearance: She is well-developed.  HENT:     Head: Normocephalic and atraumatic.  Eyes:     Conjunctiva/sclera: Conjunctivae normal.  Cardiovascular:      Rate and Rhythm: Regular rhythm. Tachycardia present.     Heart sounds: No murmur heard. Pulmonary:     Effort: Pulmonary effort is normal. No respiratory distress.     Breath sounds: Examination of the left-upper field reveals rhonchi. Examination of the left-middle field reveals rhonchi. Examination of the right-lower field reveals rhonchi. Examination of the left-lower field reveals rhonchi. Rhonchi present.  Chest:     Chest wall: No tenderness.  Abdominal:     Palpations: Abdomen is soft.     Tenderness: There is no abdominal tenderness.  Musculoskeletal:        General: No swelling.     Cervical back: Neck supple.     Right lower leg: Edema present.     Left lower leg:  Edema present.     Comments: Mild edema in bilateral lower extremities  Skin:    General: Skin is warm and dry.     Capillary Refill: Capillary refill takes less than 2 seconds.  Neurological:     Mental Status: She is alert.  Psychiatric:        Mood and Affect: Mood normal.     ED Results / Procedures / Treatments   Labs (all labs ordered are listed, but only abnormal results are displayed) Labs Reviewed  BASIC METABOLIC PANEL - Abnormal; Notable for the following components:      Result Value   Glucose, Bld 172 (*)    All other components within normal limits  CBC - Abnormal; Notable for the following components:   WBC 10.6 (*)    All other components within normal limits  CBG MONITORING, ED - Abnormal; Notable for the following components:   Glucose-Capillary 165 (*)    All other components within normal limits  CBG MONITORING, ED - Abnormal; Notable for the following components:   Glucose-Capillary 165 (*)    All other components within normal limits  TROPONIN I (HIGH SENSITIVITY) - Abnormal; Notable for the following components:   Troponin I (High Sensitivity) 24 (*)    All other components within normal limits  TROPONIN I (HIGH SENSITIVITY) - Abnormal; Notable for the following components:    Troponin I (High Sensitivity) 22 (*)    All other components within normal limits  RESP PANEL BY RT-PCR (RSV, FLU A&B, COVID)  RVPGX2  BRAIN NATRIURETIC PEPTIDE  I-STAT BETA HCG BLOOD, ED ([facility], [facility], [facility] ONLY)    EKG None  Radiology CT Angio Chest PE W and/or Wo Contrast  Result Date: 04/11/2023 CLINICAL DATA:  Cough, congestion, hyperglycemia. History of COPD and asthma. Recently diagnosed with CHF. EXAM: CT ANGIOGRAPHY CHEST WITH CONTRAST TECHNIQUE: Multidetector CT imaging of the chest was performed using the standard protocol during bolus administration of intravenous contrast. Multiplanar CT image reconstructions and MIPs were obtained to evaluate the vascular anatomy. RADIATION DOSE REDUCTION: This exam was performed according to the departmental dose-optimization program which includes automated exposure control, adjustment of the mA and/or kV according to patient size and/or use of iterative reconstruction technique. CONTRAST:  OMNIPAQUE IOHEXOL 350 MG/ML SOLN COMPARISON:  Chest radiograph 04/10/2023 and CT chest 11/28/2020 FINDINGS: Cardiovascular: Satisfactory opacification of the pulmonary arteries to the segmental level. No evidence of pulmonary embolism. Heart size upper limits of normal. No pericardial effusion. Mediastinum/Nodes: No enlarged mediastinal, hilar, or axillary lymph nodes. Thyroid gland, trachea, and esophagus demonstrate no significant findings. Lungs/Pleura: Predominantly expiratory phase scan. Patchy bilateral ground-glass opacities greatest in the left upper lobe. Left lower lobe atelectasis. No pleural effusion or pneumothorax. Upper Abdomen: No acute abnormality. Musculoskeletal: No chest wall abnormality. No acute osseous findings. Review of the MIP images confirms the above findings. IMPRESSION: 1. No evidence of pulmonary embolism. 2. Patchy bilateral ground-glass opacities greatest in the left upper lobe, favor atypical infection versus edema. Electronically Signed    By: Minerva Fester M.D.   On: 04/11/2023 02:29   DG Chest 2 View  Result Date: 04/10/2023 CLINICAL DATA:  Shortness of breath EXAM: CHEST - 2 VIEW COMPARISON:  Chest radiograph dated 11/22/2021 FINDINGS: Normal lung volumes. Bibasilar patchy opacities. No pleural effusion or pneumothorax. The heart size and mediastinal contours are within normal limits. No acute osseous abnormality. IMPRESSION: Bibasilar patchy opacities, which may represent atelectasis or infection. Electronically Signed   By: Benjaman Kindler  Xu M.D.   On: 04/10/2023 21:31    Procedures Procedures    Medications Ordered in ED Medications  furosemide (LASIX) injection 20 mg (20 mg Intravenous Given 04/10/23 2329)  fentaNYL (SUBLIMAZE) injection 50 mcg (50 mcg Intravenous Given 04/10/23 2329)  cefTRIAXone (ROCEPHIN) 1 g in sodium chloride 0.9 % 100 mL IVPB (0 g Intravenous Stopped 04/11/23 0215)  azithromycin (ZITHROMAX) 500 mg in sodium chloride 0.9 % 250 mL IVPB (0 mg Intravenous Stopped 04/11/23 0225)  iohexol (OMNIPAQUE) 350 MG/ML injection 150 mL (150 mLs Intravenous Contrast Given 04/11/23 0210)  morphine (PF) 4 MG/ML injection 4 mg (4 mg Intravenous Given 04/11/23 0227)  ondansetron (ZOFRAN) injection 4 mg (4 mg Intravenous Given 04/11/23 0227)    ED Course/ Medical Decision Making/ A&P                             Medical Decision Making Amount and/or Complexity of Data Reviewed Radiology: ordered.  Risk Prescription drug management. Decision regarding hospitalization.   This patient presents to the ED for concern of shortness of breath, this involves an extensive number of treatment options, and is a complaint that carries with it a high risk of complications and morbidity.  The differential diagnosis includes COPD exacerbation, pneumonia, PE, others   Co morbidities that complicate the patient evaluation  History of heart failure, COPD, asthma, medication noncompliance   Additional history obtained:  Additional history  obtained from family at bedside External records from outside source obtained and reviewed including notes from her hospitalization in April of this year   Lab Tests:  I Ordered, and personally interpreted labs.  The pertinent results include: Grossly unremarkable BMP, CBC, negative respiratory panel, negative pregnancy test, BNP 74.8, initial troponin 24, repeat troponin 22, glucose 165   Imaging Studies ordered:  I ordered imaging studies including chest x-ray and CT angio chest PE study I independently visualized and interpreted imaging which showed  Bibasilar patchy opacities, which may represent atelectasis or  infection.  CT angio chest PE study shows no pulmonary embolism.  Likely pneumonia in left upper lobe. I agree with the radiologist interpretation   Cardiac Monitoring: / EKG:  The patient was maintained on a cardiac monitor.  I personally viewed and interpreted the cardiac monitored which showed an underlying rhythm of: Sinus tachycardia    Problem List / ED Course / Critical interventions / Medication management   I ordered medication including fentanyl and morphine for pain, Zofran for nausea, Lasix for possible fluid overload, Rocephin and Zithromax for community-acquired pneumonia Reevaluation of the patient after these medicines showed that the patient improved I have reviewed the patients home medicines and have made adjustments as needed   Social Determinants of Health:  Patient has no reported health insurance   Test / Admission - Considered:  Patient with imaging consistent with left upper lobe pneumonia.  No significant signs of fluid overload at this time.  Glucose below 200, nonemergent hyperglycemia.  Plan to discharge at this time with prescriptions for antibiotics for community-acquired pneumonia, will refill Tresiba and Lasix based on discharge summary from previous hospitalization.     Prior to discharge patient had a drop in her oxygen  saturation.  Patient had oxygen saturations in the 86% range on room air.  Patient was placed on 2 L nasal cannula.  Patient will need admission at this time for continued management and oxygen therapy.  Requested consultation with the medicine team.  Dr. Julian Reil agreed to place the patient on list for admission.  States the day team will see the patient         Final Clinical Impression(s) / ED Diagnoses Final diagnoses:  Community acquired pneumonia of left upper lobe of lung  Hyperglycemia due to diabetes mellitus (HCC)    Rx / DC Orders ED Discharge Orders          Ordered    azithromycin (ZITHROMAX) 250 MG tablet  Daily        04/11/23 0252    amoxicillin-clavulanate (AUGMENTIN) 875-125 MG tablet  Every 12 hours        04/11/23 0252    Insulin Degludec FlexTouch 100 UNIT/ML SOPN  Daily        04/11/23 0252    furosemide (LASIX) 20 MG tablet  Daily        04/11/23 0252              Darrick Grinder, PA-C 04/11/23 0253    Darrick Grinder, PA-C 04/11/23 0316    Pricilla Loveless, MD 04/16/23 [phone]

## 2023-04-11 NOTE — H&P (Addendum)
History and Physical    Patient: Cathy Hall ZOX:[id number] DOB: 1971/03/05 DOA: 04/10/2023 DOS: the patient was seen and examined on 04/11/2023 PCP: Patient, No Pcp Per  Patient coming from: Home  Chief Complaint:  Chief Complaint  Patient presents with   Shortness of Breath   Cough   HPI: Cathy Hall is a 52 y.o. female with medical history significant of morbid obesity with a BMI of 51, COPD/asthma, diabetes, diastolic dysfunction, hypertension, remote CVA and peripheral neuropathy.  Patient presented to the ER with cough congestion and elevated blood glucose.  She was hospitalized with exacerbation of HFpEF April 2024 in [place].  Patient was discharged on appropriate medications but has not taken Lasix for 5 days and most of her other medications due to lack of insurance or funds to pay for medicines out-of-pocket.  She states she is now a resident of [address] and living in [place].  Due to the death of a recent family member she has been traveling back and forth between [place] and [place] to handle the estate.  She reports she has been having a strong productive cough with status of nasal secretions and congestion for about 1-1/2 months.  She was instructed to follow-up as an outpatient to obtain a polysomnogram due to significant concerns over having underlying sleep apnea.  She notes that she is aware she stops breathing while sleeping and had much better respiratory symptoms after CPAP was utilized during previous hospitalization.  She reports she is trying to get disability.  Upon presentation to the ER she was afebrile, slightly tachypneic and tachycardic with elevated blood pressure 179/100 room air sats were 96% but dropped down to 87% when supine or while asleep.  Labs were unremarkable except modestly elevated troponin of 24 and 22.  Viral PCR for flu, RSV and COVID were negative.  hCG was less than 5.  Chest x-ray revealed  bibasilar patchy opacities which could be either atelectasis or infection.  Given her history of recurrent car travel in context of morbid obesity CTA of the chest to rule out PE was completed.  No evidence of PE but there were patchy bilateral groundglass opacities greatest in the left upper lobe favoring atypical infection versus edema.  Hospitalist service was asked to consult and evaluate the patient for admission.  Upon my evaluation of the patient she was lying on her right side and coughing repeatedly with wet sounding cough and excessive nasal congestion noted.  History as noted above confirmed.  She reports that she needs a polysomnogram test but has been unable to arrange due to traveling back and forth to [place] as noted above.  She also needs a new glucometer.   Review of Systems: As mentioned in the history of present illness. All other systems reviewed and are negative. Past Medical History:  Diagnosis Date   Anxiety    Asthma    COPD (chronic obstructive pulmonary disease) (HCC)    Depression    Hypertension    Major depression    PTSD (post-traumatic stress disorder)    Past Surgical History:  Procedure Laterality Date   ECTOPIC PREGNANCY SURGERY     Social History:  reports that she has been smoking cigarettes. She has been smoking an average of 1 pack per day. She has never used smokeless tobacco. She reports current alcohol use. She reports current drug use. Drugs: Cocaine and Marijuana.  Allergies  Allergen Reactions   Nsaids Other (See Comments)  Kidney Disorder    Family History  Problem Relation Age of Onset   Cerebral aneurysm Mother    Diabetes Sister    Other Neg Hx     Prior to Admission medications   Medication Sig Start Date End Date Taking? Authorizing Provider  amoxicillin-clavulanate (AUGMENTIN) 875-125 MG tablet Take 1 tablet by mouth every 12 (twelve) hours. 04/11/23  Yes Darrick Grinder, PA-C  azithromycin (ZITHROMAX) 250 MG tablet  Take 1 tablet (250 mg total) by mouth daily. Take first 2 tablets together, then 1 every day until finished. 04/11/23  Yes Darrick Grinder, PA-C  furosemide (LASIX) 20 MG tablet Take 1 tablet (20 mg total) by mouth daily. 04/11/23  Yes Darrick Grinder, PA-C  Insulin Degludec FlexTouch 100 UNIT/ML SOPN Inject 20 Units into the skin daily. 04/11/23  Yes Barrie Dunker B, PA-C  valsartan (DIOVAN) 40 MG tablet Take by mouth. 03/10/23  Yes [provider]  albuterol (VENTOLIN HFA) 108 (90 Base) MCG/ACT inhaler INHALE 2 PUFFS INTO THE LUNGS EVERY FOUR HOURS AS NEEDED FOR WHEEZING OR SHORTNESS OF BREATH. Patient taking differently: Inhale 1-2 puffs into the lungs every 4 (four) hours as needed for wheezing or shortness of breath. 11/30/20 11/30/21  Marguerita Merles Latif, DO  amLODipine (NORVASC) 10 MG tablet Take 10 mg by mouth daily. 01/29/20   [provider]  atorvastatin (LIPITOR) 40 MG tablet TAKE 1 TABLET (40 MG TOTAL) BY MOUTH DAILY. Patient taking differently: Take 40 mg by mouth daily. 11/30/20 11/30/21  Marguerita Merles Latif, DO  dicyclomine (BENTYL) 20 MG tablet Take 1 tablet (20 mg total) by mouth 2 (two) times daily. 01/31/23   Glyn Ade, MD  FLUoxetine (PROZAC) 20 MG capsule Take 1 capsule (20 mg total) by mouth daily. 11/30/21   Bobbye Morton, MD  fluticasone (FLONASE) 50 MCG/ACT nasal spray PLACE 2 SPRAYS INTO BOTH NOSTRILS DAILY. 11/30/20 11/30/21  Marguerita Merles Latif, DO  gabapentin (NEURONTIN) 400 MG capsule Take 1 capsule (400 mg total) by mouth 3 (three) times daily. 11/29/21   Bobbye Morton, MD  loratadine (CLARITIN) 10 MG tablet TAKE 1 TABLET (10 MG TOTAL) BY MOUTH DAILY. Patient taking differently: Take 10 mg by mouth daily. 11/30/20 11/30/21  Marguerita Merles Latif, DO  magic mouthwash SOLN Take 5 mLs by mouth 3 (three) times daily as needed for mouth pain. Shake Well. Swish and swallow. Do not give within 2 hours of Cipro, Avelox, Levaquin or tetracycline. 11/29/21    Bobbye Morton, MD  melatonin 3 MG TABS tablet Take 2 tablets (6 mg total) by mouth at bedtime. 11/29/21   Bobbye Morton, MD  metFORMIN (GLUCOPHAGE) 500 MG tablet Take 1 tablet (500 mg total) by mouth daily with breakfast. 11/30/21   Bobbye Morton, MD  methocarbamol (ROBAXIN) 500 MG tablet Take 1 tablet (500 mg total) by mouth every 6 (six) hours as needed for muscle spasms. 11/29/21   Bobbye Morton, MD  mometasone-formoterol (DULERA) 100-5 MCG/ACT AERO INHALE 2 PUFFS INTO THE LUNGS TWO TIMES DAILY. 11/30/20 11/30/21  Marguerita Merles Latif, DO  montelukast (SINGULAIR) 10 MG tablet TAKE 1 TABLET (10 MG TOTAL) BY MOUTH AT BEDTIME. Patient taking differently: Take 10 mg by mouth at bedtime. 11/30/20 11/30/21  Marguerita Merles Latif, DO  Multiple Vitamin (MULTIVITAMIN WITH MINERALS) TABS tablet Take 1 tablet by mouth daily. 11/30/20   Sheikh, Kateri Mc Latif, DO  nicotine (NICODERM CQ - DOSED IN MG/24 HOURS) 14 mg/24hr patch Place 1 patch (14  mg total) onto the skin daily. 11/30/21   Bobbye Morton, MD  OLANZapine (ZYPREXA) 7.5 MG tablet Take 1 tablet (7.5 mg total) by mouth at bedtime. 11/29/21   Bobbye Morton, MD  omeprazole (PRILOSEC) 20 MG capsule Take 1 capsule (20 mg total) by mouth daily. 01/31/23   Glyn Ade, MD  pantoprazole (PROTONIX) 40 MG tablet TAKE 1 TABLET (40 MG TOTAL) BY MOUTH DAILY. Patient taking differently: Take 40 mg by mouth daily. 11/30/20 11/30/21  Marguerita Merles Latif, DO  polyvinyl alcohol (LIQUIFILM TEARS) 1.4 % ophthalmic solution Place 1 drop into both eyes daily as needed for dry eyes. 11/30/20   Marguerita Merles Latif, DO  sodium chloride (OCEAN) 0.65 % nasal spray PLACE 1 SPRAY INTO BOTH NOSTRILS AS NEEDED FOR CONGESTION. Patient taking differently: Place 1 spray into the nose daily as needed for congestion. 11/30/20 11/30/21  Marguerita Merles Latif, DO  sodium chloride (OCEAN) 0.65 % SOLN nasal spray Place 1 spray into both nostrils as needed for congestion. Patient not taking:  Reported on 11/20/2021 11/30/20   Marguerita Merles Latif, DO  traZODone (DESYREL) 50 MG tablet Take 1 tablet (50 mg total) by mouth at bedtime. 11/29/21   Bobbye Morton, MD    Physical Exam: Vitals:   04/11/23 0430 04/11/23 0637 04/11/23 0700 04/11/23 0730  BP: 124/83  (!) 139/98 (!) 152/105  Pulse: 90  94 95  Resp: (!) 21  (!) 21 (!) 25  Temp:  98.7 F (37.1 C)    TempSrc:  Oral    SpO2: 97%  93% 96%  Weight:       Constitutional: NAD, calm, clinically stable, morbidly obese Eyes: PERRL, lids and conjunctivae normal ENMT: Mucous membranes are moist. Posterior pharynx clear of any exudate or lesions.Normal dentition.  Significant nasal congestion Neck: normal, supple, no masses, no thyromegaly Respiratory: Significant amount of upper airway congestion with recurrent coughing.  Posterior lung sounds with a combination of crackles and a few expiratory rhonchi.  No increased work of breathing. No accessory muscle use.  Cardiovascular: Regular rate and rhythm, no murmurs / rubs / gallops. No extremity edema. 2+ pedal pulses.   Abdomen: no tenderness, no masses palpated.  Due to abdominal girth unable to accurately appreciate if any hepatosplenomegaly.  Bowel sounds positive.  Musculoskeletal: no clubbing / cyanosis. No joint deformity upper and lower extremities. Good ROM, no contractures. Normal muscle tone.  Skin: no rashes, lesions, ulcers. No induration Neurologic: CN 2-12 grossly intact. Sensation intact,  Strength 5/5 x all 4 extremities.  Psychiatric: Normal judgment and insight. Alert and oriented x 3. Normal mood.     Data Reviewed:  Sodium 138, potassium 3.9, chloride 101, CO2 23, glucose 172, BUN 12, creatinine 0.96  BNP 74.8 High-sensitivity troponin 2422  WBC 10,600, differential not obtained, hemoglobin 13.4, platelets 302,000  Influenza, RSV and COVID PCR negative  Assessment and Plan: Acute hypoxemic respiratory failure likely multifactorial A) left upper lobe  pneumonia vs B) asymmetric edema from diastolic heart failure exacerbation Symptom management and treat underlying causes Patient noted to have paroxysmal coughing so in addition to DuoNebs will add budesonide nebulizers twice daily (history of asthma/COPD) and will also continue home Symbicort No evidence of exacerbation of asthma/COPD-continue Singulair as well Check CRP and procalcitonin Continue Rocephin and Zithromax for possible community-acquired pneumonia but suspect viral in etiology Check full respiratory viral panel ++ FOR PARAINFLUENZA  Acute on chronic exacerbation of HFpEF As above Had been given 1 dose of IV Lasix  in the ED and will continue 40 mg IV every 12 hours Strict I/O with daily weights BNP not elevated but not unusual in the context of exacerbation of diastolic heart failure Echocardiogram in April 2024 at outside hospital revealed preserved EF and evidence of diastolic dysfunction but unable to visualize if any underlying pulmonary hypertension Repeat echocardiogram here especially with mildly elevated troponin Continue  Probably exacerbated due to nonadherence to medication regimen with patient reporting unable to obtain any medication since discharge from hospital in April in [place]  Elevated troponin EKG unremarkable and elevation not significant Suspect demand ischemia from hypoxemia  Diabetes mellitus on insulin Patient prescribed generic Evaristo Bury but has been unable to obtain and patient does not have any payer source including Medicaid Continue therapeutic substitution of Semglee insulin-prior to discharge will need to review most cost effective insulin to prescribe in the event patient has to pay out-of-pocket Check hemoglobin A1c Follow CBGs and provide SSI  Peripheral neuropathy and chronic pain syndrome Patient reported intolerance to high-dose Neurontin that she was taken previously therefore we will discontinue and begin low-dose  Lyrica Continue trazodone as a sleep aid Continue Prozac to help treat associated chronic pain related depression  Physical deconditioning and multiple medical problems in context of morbid obesity with BMI greater than 50 Patient in the process of applying for disability but has been unsuccessful and has obtained the assistance of a local law practice Patient has no PCP therefore TOC consulted to assist in accessing one of our local clinics PT/OT evaluation Patient would like to obtain a Medicaid application prior to discharge  History of polysubstance abuse (tobacco, cocaine, alcohol) Check urine drug screen      Advance Care Planning:   Code Status: Full Code   DVT prophylaxis: Lovenox  Consults: None  Family Communication: Patient and family member at bedside  Severity of Illness: The appropriate patient status for this patient is OBSERVATION. Observation status is judged to be reasonable and necessary in order to provide the required intensity of service to ensure the patient's safety. The patient's presenting symptoms, physical exam findings, and initial radiographic and laboratory data in the context of their medical condition is felt to place them at decreased risk for further clinical deterioration. Furthermore, it is anticipated that the patient will be medically stable for discharge from the hospital within 2 midnights of admission.   Author: Junious Silk, NP 04/11/2023 9:26 AM  For on call review [email or web address].

## 2023-04-11 NOTE — ED Notes (Signed)
Placed patient on 2L O2 d/t sats dropping down to 87% RA, per MD order.
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
## 2023-04-11 NOTE — ED Notes (Signed)
Pt was ambulated with no desaturation in O2. Pt's O2 saturation was maintained around 97%. MD notified

## 2023-04-11 NOTE — ED Notes (Signed)
Pt to CT

## 2023-04-11 NOTE — Hospital Course (Addendum)
51yof w/ diastolic CHF, polysubstance abuse, morbid obesity, COPD, CVA, HTN, DM 2 peripheral neuropathy presented to the ED with cough congestion elevated blood glucose.  He recently hospitalized for CHF exacerbation and epilepticus and 25 in [place] and discharged-but has not taken Lasix x 5 days and most of her medication due to lack of insurance In the ED hypertensive saturation hypoxic up to 87% when supine or when asleep, labs showed mildly elevated troponin viral PCR positive for parainfluenza hCG less than 5 chest x-ray bibasilar patchy opacities CT of the chest no PE but patchy bilateral groundglass opacities greatest in the left upper lobe favoring atypical infection versus edema. Patient is admitted and treated for acute respiratory distress due to COPD/asthma exacerbation acute on chronic hypoxic hypercapnic respiratory failure from parainfluenza pneumonia in the setting of smoking.  Seen by pulmonary, now needing BiPAP.  She had uncontrolled hyperglycemia in the setting of steroid use needing insulin

## 2023-04-12 DIAGNOSIS — J122 Parainfluenza virus pneumonia: Principal | ICD-10-CM

## 2023-04-12 LAB — CBC
HCT: 40.7 % (ref 36.0–46.0)
Hemoglobin: 12.8 g/dL (ref 12.0–15.0)
MCH: 30 pg (ref 26.0–34.0)
MCHC: 31.4 g/dL (ref 30.0–36.0)
MCV: 95.5 fL (ref 80.0–100.0)
Platelets: 281 10*3/uL (ref 150–400)
RBC: 4.26 MIL/uL (ref 3.87–5.11)
RDW: 14.1 % (ref 11.5–15.5)
WBC: 9.4 10*3/uL (ref 4.0–10.5)
nRBC: 0.2 % (ref 0.0–0.2)

## 2023-04-12 LAB — HEMOGLOBIN A1C
Hgb A1c MFr Bld: 8.2 % — ABNORMAL HIGH (ref 4.8–5.6)
Mean Plasma Glucose: 189 mg/dL

## 2023-04-12 LAB — BASIC METABOLIC PANEL
Anion gap: 11 (ref 5–15)
BUN: 12 mg/dL (ref 6–20)
CO2: 27 mmol/L (ref 22–32)
Calcium: 8.7 mg/dL — ABNORMAL LOW (ref 8.9–10.3)
Chloride: 99 mmol/L (ref 98–111)
Creatinine, Ser: 1.07 mg/dL — ABNORMAL HIGH (ref 0.44–1.00)
GFR, Estimated: >60 mL/min (ref >60)
Glucose, Bld: 200 mg/dL — ABNORMAL HIGH (ref 70–99)
Potassium: 4 mmol/L (ref 3.5–5.1)
Sodium: 137 mmol/L (ref 135–145)

## 2023-04-12 LAB — GLUCOSE, CAPILLARY
Glucose-Capillary: 202 mg/dL — ABNORMAL HIGH (ref 70–99)
Glucose-Capillary: 153 mg/dL — ABNORMAL HIGH (ref 70–99)
Glucose-Capillary: 164 mg/dL — ABNORMAL HIGH (ref 70–99)
Glucose-Capillary: 234 mg/dL — ABNORMAL HIGH (ref 70–99)

## 2023-04-12 LAB — PROCALCITONIN: Procalcitonin: <0.1 ng/mL

## 2023-04-12 MED ORDER — SENNOSIDES-DOCUSATE SODIUM 8.6-50 MG PO TABS: 1.0000 | ORAL_TABLET | Freq: Every evening | ORAL | Status: DC | PRN

## 2023-04-12 MED ORDER — TRAZODONE HCL 50 MG PO TABS
50.0000 mg | ORAL_TABLET | Freq: Every evening | ORAL | Status: DC | PRN
  Administered 2023-04-12 – 2023-04-17 (×5): 50 mg via ORAL
  Filled 2023-04-12 (×5): qty 1

## 2023-04-12 MED ORDER — IPRATROPIUM-ALBUTEROL 0.5-2.5 (3) MG/3ML IN SOLN
3.0000 mL | Freq: Two times a day (BID) | RESPIRATORY_TRACT | Status: DC
  Administered 2023-04-12 – 2023-04-13 (×3): 3 mL via RESPIRATORY_TRACT
  Filled 2023-04-12 (×3): qty 3

## 2023-04-12 MED ORDER — FLUOXETINE HCL 20 MG PO CAPS: 40.0000 mg | ORAL_CAPSULE | Freq: Every day | ORAL | Status: DC

## 2023-04-12 MED ORDER — PREDNISONE 20 MG PO TABS
40.0000 mg | ORAL_TABLET | Freq: Every day | ORAL | Status: DC
  Administered 2023-04-12 – 2023-04-13 (×2): 40 mg via ORAL
  Filled 2023-04-12 (×2): qty 2

## 2023-04-12 MED ORDER — FUROSEMIDE 20 MG PO TABS
20.0000 mg | ORAL_TABLET | Freq: Every day | ORAL | Status: DC
  Administered 2023-04-12 – 2023-04-14 (×3): 20 mg via ORAL
  Filled 2023-04-12 (×3): qty 1

## 2023-04-12 MED ORDER — PHENOL 1.4 % MT LIQD
1.0000 | OROMUCOSAL | Status: DC | PRN
  Administered 2023-04-12: 1 via OROMUCOSAL
  Filled 2023-04-12: qty 177

## 2023-04-12 MED ORDER — IRBESARTAN 75 MG PO TABS: 75.0000 mg | ORAL_TABLET | Freq: Every day | ORAL | Status: DC

## 2023-04-12 MED ORDER — METOPROLOL TARTRATE 5 MG/5ML IV SOLN: 5.0000 mg | INTRAVENOUS | Status: DC | PRN

## 2023-04-12 MED ORDER — ATORVASTATIN CALCIUM 40 MG PO TABS: 40.0000 mg | ORAL_TABLET | Freq: Every day | ORAL | Status: DC

## 2023-04-12 MED ORDER — GABAPENTIN 300 MG PO CAPS
900.0000 mg | ORAL_CAPSULE | Freq: Three times a day (TID) | ORAL | Status: DC
  Administered 2023-04-12 – 2023-04-15 (×10): 900 mg via ORAL
  Filled 2023-04-12 (×10): qty 3

## 2023-04-12 MED ORDER — HYDRALAZINE HCL 20 MG/ML IJ SOLN
10.0000 mg | INTRAMUSCULAR | Status: DC | PRN
  Filled 2023-04-12: qty 1

## 2023-04-12 MED ORDER — TRAMADOL HCL 50 MG PO TABS
50.0000 mg | ORAL_TABLET | Freq: Two times a day (BID) | ORAL | Status: DC | PRN
  Administered 2023-04-12 – 2023-04-14 (×4): 50 mg via ORAL
  Filled 2023-04-12 (×4): qty 1

## 2023-04-12 MED ORDER — GUAIFENESIN 100 MG/5ML PO LIQD
5.0000 mL | ORAL | Status: DC | PRN
  Administered 2023-04-12 – 2023-04-17 (×5): 5 mL via ORAL
  Filled 2023-04-12 (×7): qty 10

## 2023-04-12 MED ORDER — HYDROXYZINE HCL 25 MG PO TABS
50.0000 mg | ORAL_TABLET | Freq: Four times a day (QID) | ORAL | Status: DC | PRN
  Administered 2023-04-12 – 2023-04-21 (×9): 50 mg via ORAL
  Filled 2023-04-12 (×9): qty 2

## 2023-04-12 MED ORDER — PANTOPRAZOLE SODIUM 40 MG PO TBEC: 40.0000 mg | DELAYED_RELEASE_TABLET | Freq: Every day | ORAL | Status: DC

## 2023-04-12 MED ORDER — IPRATROPIUM-ALBUTEROL 0.5-2.5 (3) MG/3ML IN SOLN: 3.0000 mL | RESPIRATORY_TRACT | Status: DC | PRN

## 2023-04-12 MED ORDER — CLOPIDOGREL BISULFATE 75 MG PO TABS: 75.0000 mg | ORAL_TABLET | Freq: Every evening | ORAL | Status: DC

## 2023-04-12 NOTE — Plan of Care (Signed)
  RD consulted for nutrition education regarding diabetes and congestive heart failure .   Lab Results  Component Value Date   HGBA1C 8.2 (H) 04/11/2023    RD provided "Heart Healthy Consistent Carbohydrate Nutrition Therapy and Carbohydrate Counting for People with Diabetes " handout from the Academy of Nutrition and Dietetics. Discussed different food groups and their effects on blood sugar and heart failure, emphasizing carbohydrate, high saturated fat, and sodium-containing foods. Provided list of carbohydrates, salt and fat and recommended serving sizes of common foods.  Discussed importance of controlled and consistent carbohydrate intake throughout the day. Patient began to fall asleep during education. RD left handouts at bedside and encouraged her to read it and write down questions for follow up.   Expect minimal to moderate compliance.  Body mass index is 50.64 kg/m. Pt meets criteria for morbid obesity based on current BMI.  Current diet order is heart/carb, patient is consuming approximately N/A % of meals at this time. Labs and medications reviewed. No further nutrition interventions warranted at this time. RD contact information provided. If additional nutrition issues arise, please re-consult RD.  Leodis Rains, RDN, LDN  Clinical Nutrition

## 2023-04-12 NOTE — Progress Notes (Signed)
Pharmacy note - antimicrobial stewardship  Respiratory panel positive for parainfluenza.  Procalcitonin <0.1 yesterday and today.  Ceftriaxone + azithromycin started empirically.  Asked Dr. Nelson Chimes if we could stop antibiotic today and he agreed.  Celedonio Miyamoto, PharmD, BCIDP Clinical Pharmacist Phone [phone]

## 2023-04-12 NOTE — Inpatient Diabetes Management (Addendum)
Inpatient Diabetes Program Recommendations  AACE/ADA: New Consensus Statement on Inpatient Glycemic Control (2015)  Target Ranges:  Prepandial:   less than 140 mg/dL      Peak postprandial:   less than 180 mg/dL (1-2 hours)      Critically ill patients:  140 - 180 mg/dL   Lab Results  Component Value Date   GLUCAP 202 (H) 04/12/2023   HGBA1C 8.2 (H) 04/11/2023    Review of Glycemic Control  Latest Reference Range & Units 04/11/23 12:17 04/11/23 16:03 04/11/23 18:01 04/11/23 21:31 04/12/23 07:58  Glucose-Capillary 70 - 99 mg/dL 841 (H) 324 (H) 401 (H) 200 (H) 202 (H)   Diabetes history: DM 2 Outpatient Diabetes medications: Victoza 1.8 mg Daily Current orders for Inpatient glycemic control:  Semglee 20 units qhs Novolog 0-20 units tid + hs  A1c 8.2%  Inpatient Diabetes Program Recommendations:     -   Consider increasing Semglee to 25 units  Spoke with pt at bedside regarding A1c of 8.2% and glucose control at home. Pt reports only being on Victoza but has not injected it for about a week. Pt reports needing a glucometer at home. Pt reports following up with her doctor every month. Pt has had some weight loss on the victoza medication. Patient last remembers her A1c being lower than the 8.2% this admission. Pt does not have insurance and is in need of glucometer. Discussed A1c and glucose goals. Encouraged follow up  I provided pt a glucose meter kit from [employer] via [employer].  Thanks,  [name] RN, MSN, BC-ADM Inpatient Diabetes Coordinator Team Pager [phone] (8a-5p)

## 2023-04-12 NOTE — Progress Notes (Signed)
   04/12/23 2331  BiPAP/CPAP/SIPAP  $ Non-Invasive Ventilator  Non-Invasive Vent Set Up;Non-Invasive Vent Initial  $ Face Mask Medium Yes  BiPAP/CPAP/SIPAP Pt Type Adult  BiPAP/CPAP/SIPAP DREAMSTATIOND  Mask Type Full face mask  Mask Size Medium  Respiratory Rate 20 breaths/min  Flow Rate 3 lpm  Patient Home Equipment No  Auto Titrate Yes  CPAP/SIPAP surface wiped down Yes  BiPAP/CPAP /SiPAP Vitals  Bilateral Breath Sounds Diminished

## 2023-04-12 NOTE — Progress Notes (Signed)
   04/12/23 0118  BiPAP/CPAP/SIPAP  BiPAP/CPAP/SIPAP Pt Type Adult  Reason BIPAP/CPAP not in use Non-compliant   RN made aware to contact RT if any changes, pt refusing CPAP at this time.

## 2023-04-12 NOTE — TOC Initial Note (Signed)
Transition of Care [employer] [facility]) - Initial/Assessment Note    Patient Details  Name: Cathy Hall MRN: [id number] Date of Birth: 01/25/1971  Transition of Care [facility]) CM/SW Contact:    Gala Lewandowsky, RN Phone Number: 04/12/2023, 2:27 PM  Clinical Narrative:  Patient presented for shortness of breath and cough. PTA patient was from home with S/O. Case Manager spoke with patient and she is without insurance and she is not currently working. Patient does not have a PCP and is agreeable for Case Manager to call the [facility] Medicine [facility], Appointment placed on the AVS. Patient states she has filed for disability and awaiting confirmation. Patient states she has transportation to appointments. SDOH completed and resources provided for food banks. Diabetes coordinator spoke with patient and provided the patient with a glucose meter kit from [facility] via [employer]. Patient will need assistance from [facility] as she progresses. Case Manager will continue to follow for additional transition of care needs.                  Expected Discharge Plan: Home/Self Care Barriers to Discharge: Continued Medical Work up   Patient Goals and CMS Choice     Choice offered to / list presented to : NA      Expected Discharge Plan and Services In-house Referral: Clinical Social Work Discharge Planning Services: CM Consult, Follow-up appt scheduled, Indigent Health Clinic, [facility] Program, Medication Assistance Post Acute Care Choice: NA Living arrangements for the past 2 months: Apartment                   DME Agency: NA       HH Arranged: NA          Prior Living Arrangements/Services Living arrangements for the past 2 months: Apartment Lives with:: Significant Other Patient language and need for interpreter reviewed:: Yes Do you feel safe going back to the place where you live?: Yes      Need for Family Participation in Patient Care: No (Comment) Care giver support system in  place?: No (comment)   Criminal Activity/Legal Involvement Pertinent to Current Situation/Hospitalization: No - Comment as needed  Activities of Daily Living Home Assistive Devices/Equipment: Environmental consultant (specify type), Wheelchair ADL Screening (condition at time of admission) Patient's cognitive ability adequate to safely complete daily activities?: Yes Is the patient deaf or have difficulty hearing?: No Does the patient have difficulty seeing, even when wearing glasses/contacts?: No Does the patient have difficulty concentrating, remembering, or making decisions?: No Patient able to express need for assistance with ADLs?: Yes Does the patient have difficulty dressing or bathing?: No Independently performs ADLs?: Yes (appropriate for developmental age) Does the patient have difficulty walking or climbing stairs?: Yes Weakness of Legs: Both Weakness of Arms/Hands: Both  Permission Sought/Granted Permission sought to share information with : Case Manager, Photographer granted to share info w AGENCY: Internal Medicine Clinic.        Emotional Assessment Appearance:: Appears stated age Attitude/Demeanor/Rapport: Engaged Affect (typically observed): Appropriate Orientation: : Oriented to Situation, Oriented to  Time, Oriented to Place, Oriented to Self Alcohol / Substance Use: Not Applicable Psych Involvement: No (comment)  Admission diagnosis:  Left upper lobe pneumonia [J18.9] Community acquired pneumonia of left upper lobe of lung [J18.9] Hyperglycemia due to diabetes mellitus (HCC) [E11.65] Patient Active Problem List   Diagnosis Date Noted   Left upper lobe pneumonia 04/11/2023   Parainfluenza pneumonia 04/11/2023   Acute on  chronic diastolic CHF (congestive heart failure) (HCC) 04/11/2023   Atypical chest pain 11/28/2021   Impaired ambulation 11/28/2021   Cocaine abuse (HCC) 11/22/2021   MDD (major depressive disorder), recurrent, severe,  with psychosis (HCC)    Suicidal ideation    PAD (peripheral artery disease) mild, left LE (HCC) 11/23/2020   Right knee pain    Torn ACL    PVD (peripheral vascular disease) (HCC)    Chronic obstructive pulmonary disease (HCC)    Gastroesophageal reflux disease    Obesity, Class III, BMI 40-49.9 (morbid obesity) (HCC) 11/13/2020   ARF (acute renal failure) (HCC) 11/11/2020   Acute CVA (cerebrovascular accident) (HCC) 11/10/2020   Epigastric pain 11/05/2020   Palpitations 11/05/2020   Essential hypertension 11/05/2020   Cocaine use disorder, moderate, dependence (HCC) 08/09/2020   Severe episode of recurrent major depressive disorder, without psychotic features (HCC)    Feeling suicidal 12/31/2019   Bereavement due to life event 12/31/2019   Hypertensive emergency 12/27/2019   Anxiety 07/28/2019   Dyslipidemia (high LDL; low HDL) 11/06/2018   Polysubstance abuse (HCC) 11/06/2018   Intractable vomiting with nausea 11/05/2018   Abnormal kidney function 03/18/2017   Cervical muscle strain 07/27/2016   Constipation 03/06/2016   Hypokalemia 03/06/2016   Intrauterine device surveillance 10/03/2015   Urinary leakage 10/03/2015   Cigarette smoker 10/21/2014   Carpal tunnel syndrome, bilateral 06/03/2014   Chronic leg pain 05/10/2014   Trigger thumb 04/23/2014   Ovarian cyst, complex 10/21/2013   Alcohol abuse 10/21/2013   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, [address] [place] [address] Phone: [phone] Fax: [phone]  [id number] [place], [place] - [address] AT [facility] [address] [place] [address] Phone: [phone] Fax: [phone]  [facility] [address] [place] [address] Phone: [phone] Fax: [phone]  [id number] - [place], [place] - [address] AT [facility] [address] [facility] [address] [place] [address] Phone: [phone] Fax: [phone]     Social Determinants of Health (SDOH) Social History: SDOH Screenings   Food Insecurity: Food Insecurity Present (04/11/2023)  Housing: Medium Risk (04/11/2023)  Transportation Needs: Unmet Transportation Needs (04/11/2023)  Utilities: Not At Risk (04/11/2023)  Alcohol Screen: Low Risk  (11/21/2021)  Depression (PHQ2-9): Medium Risk (08/07/2020)  Tobacco Use: High Risk (04/10/2023)   SDOH Interventions: Food Insecurity Interventions: Other (Comment) (Food Pantry resources provided.)   Readmission Risk Interventions    11/16/2020   11:53 AM  Readmission Risk Prevention Plan  Transportation Screening Complete  PCP or Specialist Appt within 3-5 Days Complete  HRI or Home Care Consult Complete  Social Work Consult for Recovery Care Planning/Counseling Complete  Palliative Care Screening Complete  Medication Review Oceanographer) Complete

## 2023-04-12 NOTE — Progress Notes (Signed)
PROGRESS NOTE    Cathy Hall  ZOX:[id number] DOB: June 19, 1971 DOA: 04/10/2023 PCP: Patient, No Pcp Per   Brief Narrative:  52 year old with history of diastolic CHF, polysubstance abuse, morbid obesity, COPD, CVA, HTN, DM 2 admitted to the hospital for shortness of breath.  Patient was found to have parainfluenza a positive.  Procalcitonin negative therefore stopping antibiotics.   Assessment & Plan:  Principal Problem:   Parainfluenza pneumonia Active Problems:   Acute on chronic diastolic CHF (congestive heart failure) (HCC)   Essential hypertension   Obesity, Class III, BMI 40-49.9 (morbid obesity) (HCC)   PVD (peripheral vascular disease) (HCC)   Chronic obstructive pulmonary disease (HCC)   Dyslipidemia (high LDL; low HDL)   Polysubstance abuse (HCC)   Left upper lobe pneumonia    Acute respiratory distress Parainfluenza pneumonia Acute  COPD/asthma Procalcitonin is negative.  Respiratory panel positive for parainfluenza infection.  Will discontinue antibiotics.  Supportive care with bronchodilators, I-S and flutter valve. Start Prednisone.      Acute on chronic diastolic CHF, resolved - Somewhat appears to be euvolemic, transition to home daily p.o. Lasix.      Cerebrovascular disease Hypertension Hyperlipidemia - Continue atorvasatatin, Plavix.  Continue home ARB     Diabetes mellitus type II, insulin-dependent Peripheral neuropathy Holding metformin.  Continue Semglee 20 units daily.  Sliding scale and Accu-Cheks. Continue gabapentin  History of depression/anxiety - Continue home Prozac  Will benefit outpatient sleep study   DVT prophylaxis: Lovenox Code Status: Full code Family Communication:   Still has significant abnormal breath sounds.  Continue hospital stay      Diet Orders (From admission, onward)     Start     Ordered   04/11/23 0743  Diet heart healthy/carb modified Room service appropriate? Yes; Fluid consistency: Thin   Diet effective now       Question Answer Comment  Diet-HS Snack? Nothing   Room service appropriate? Yes   Fluid consistency: Thin      04/11/23 0742            Subjective: Seen and examined at bedside.  Still having quite a bit of coughing and congestion.   Examination:  General exam: Appears calm and comfortable, morbid obesity Respiratory system: Diffuse bilateral rhonchi Cardiovascular system: S1 & S2 heard, RRR. No JVD, murmurs, rubs, gallops or clicks. No pedal edema. Gastrointestinal system: Abdomen is nondistended, soft and nontender. No organomegaly or masses felt. Normal bowel sounds heard. Central nervous system: Alert and oriented. No focal neurological deficits. Extremities: Symmetric 5 x 5 power. Skin: No rashes, lesions or ulcers Psychiatry: Judgement and insight appear normal. Mood & affect appropriate.  Objective: Vitals:   04/11/23 1928 04/12/23 0002 04/12/23 0359 04/12/23 0400  BP: 129/79 127/83 114/72 108/77  Pulse: 96 93 (!) 118 99  Resp: 20 18 20 20   Temp: 97.8 F (36.6 C) 98.1 F (36.7 C) 98.2 F (36.8 C) 98.2 F (36.8 C)  TempSrc: Oral Oral Oral Oral  SpO2: 100% 97% 95% 97%  Weight:   121.6 kg     Intake/Output Summary (Last 24 hours) at 04/12/2023 0812 Last data filed at 04/12/2023 0540 Gross per 24 hour  Intake 1553 ml  Output 2975 ml  Net -1422 ml   Filed Weights   04/10/23 2102 04/12/23 0359  Weight: 122.5 kg 121.6 kg    Scheduled Meds:  atorvastatin  40 mg Oral Daily   budesonide (PULMICORT) nebulizer solution  0.25 mg Nebulization BID   clopidogrel  75  mg Oral Daily   enoxaparin (LOVENOX) injection  40 mg Subcutaneous Q24H   feeding supplement  237 mL Oral BID BM   FLUoxetine  40 mg Oral Daily   furosemide  40 mg Intravenous Q12H   guaiFENesin  600 mg Oral BID   insulin aspart  0-20 Units Subcutaneous TID WC   insulin aspart  0-5 Units Subcutaneous QHS   insulin glargine-yfgn  20 Units Subcutaneous Q2200    ipratropium-albuterol  3 mL Nebulization BID   irbesartan  75 mg Oral Daily   living well with diabetes book   Does not apply Once   mometasone-formoterol  2 puff Inhalation BID   montelukast  10 mg Oral QHS   pantoprazole  40 mg Oral Daily   pregabalin  25 mg Oral BID   sodium chloride flush  3 mL Intravenous Q12H   traZODone  50 mg Oral QHS   Continuous Infusions:  Nutritional status     Body mass index is 50.64 kg/m.  Data Reviewed:   CBC: Recent Labs  Lab 04/10/23 2112 04/11/23 1258 04/12/23 0238  WBC 10.6* 10.6* 9.4  HGB 13.4 12.8 12.8  HCT 41.3 39.8 40.7  MCV 91.6 91.9 95.5  PLT 302 280 281   Basic Metabolic Panel: Recent Labs  Lab 04/10/23 2112 04/11/23 1258 04/12/23 0238  NA 138  --  137  K 3.9  --  4.0  CL 101  --  99  CO2 23  --  27  GLUCOSE 172*  --  200*  BUN 12  --  12  CREATININE 0.96 1.01* 1.07*  CALCIUM 9.3  --  8.7*   GFR: Estimated Creatinine Clearance: 76.8 mL/min (A) (by C-G formula based on SCr of 1.07 mg/dL (H)). Liver Function Tests: No results for input(s): "AST", "ALT", "ALKPHOS", "BILITOT", "PROT", "ALBUMIN" in the last 168 hours. No results for input(s): "LIPASE", "AMYLASE" in the last 168 hours. No results for input(s): "AMMONIA" in the last 168 hours. Coagulation Profile: No results for input(s): "INR", "PROTIME" in the last 168 hours. Cardiac Enzymes: No results for input(s): "CKTOTAL", "CKMB", "CKMBINDEX", "TROPONINI" in the last 168 hours. BNP (last 3 results) No results for input(s): "PROBNP" in the last 8760 hours. HbA1C: Recent Labs    04/11/23 1258  HGBA1C 8.2*   CBG: Recent Labs  Lab 04/11/23 1217 04/11/23 1603 04/11/23 1801 04/11/23 2131 04/12/23 0758  GLUCAP 190* 186* 195* 200* 202*   Lipid Profile: No results for input(s): "CHOL", "HDL", "LDLCALC", "TRIG", "CHOLHDL", "LDLDIRECT" in the last 72 hours. Thyroid Function Tests: No results for input(s): "TSH", "T4TOTAL", "FREET4", "T3FREE", "THYROIDAB" in  the last 72 hours. Anemia Panel: No results for input(s): "VITAMINB12", "FOLATE", "FERRITIN", "TIBC", "IRON", "RETICCTPCT" in the last 72 hours. Sepsis Labs: Recent Labs  Lab 04/11/23 1258 04/12/23 0238  PROCALCITON <0.10 <0.10    Recent Results (from the past 240 hour(s))  Resp panel by RT-PCR (RSV, Flu A&B, Covid) Anterior Nasal Swab     Status: None   Collection Time: 04/10/23  9:06 PM   Specimen: Anterior Nasal Swab  Result Value Ref Range Status   SARS Coronavirus 2 by RT PCR NEGATIVE NEGATIVE Final   Influenza A by PCR NEGATIVE NEGATIVE Final   Influenza B by PCR NEGATIVE NEGATIVE Final    Comment: (NOTE) The Xpert Xpress SARS-CoV-2/FLU/RSV plus assay is intended as an aid in the diagnosis of influenza from Nasopharyngeal swab specimens and should not be used as a sole basis for treatment. Nasal washings  and aspirates are unacceptable for Xpert Xpress SARS-CoV-2/FLU/RSV testing.  Fact Sheet for Patients: BloggerCourse.com  Fact Sheet for Healthcare Providers: SeriousBroker.it  This test is not yet approved or cleared by the [place] FDA and has been authorized for detection and/or diagnosis of SARS-CoV-2 by FDA under an Emergency Use Authorization (EUA). This EUA will remain in effect (meaning this test can be used) for the duration of the COVID-19 declaration under Section 564(b)(1) of the Act, 21 U.S.C. section 360bbb-3(b)(1), unless the authorization is terminated or revoked.     Resp Syncytial Virus by PCR NEGATIVE NEGATIVE Final    Comment: (NOTE) Fact Sheet for Patients: BloggerCourse.com  Fact Sheet for Healthcare Providers: SeriousBroker.it  This test is not yet approved or cleared by the [place] FDA and has been authorized for detection and/or diagnosis of SARS-CoV-2 by FDA under an Emergency Use Authorization (EUA). This EUA will remain in  effect (meaning this test can be used) for the duration of the COVID-19 declaration under Section 564(b)(1) of the Act, 21 U.S.C. section 360bbb-3(b)(1), unless the authorization is terminated or revoked.  Performed at [facility], [address], [place], [place] [address]   Respiratory (~20 pathogens) panel by PCR     Status: Abnormal   Collection Time: 04/10/23  9:06 PM   Specimen: Nasopharyngeal Swab; Respiratory  Result Value Ref Range Status   Adenovirus NOT DETECTED NOT DETECTED Final   Coronavirus 229E NOT DETECTED NOT DETECTED Final    Comment: (NOTE) The Coronavirus on the Respiratory Panel, DOES NOT test for the novel  Coronavirus (2019 nCoV)    Coronavirus HKU1 NOT DETECTED NOT DETECTED Final   Coronavirus NL63 NOT DETECTED NOT DETECTED Final   Coronavirus OC43 NOT DETECTED NOT DETECTED Final   Metapneumovirus NOT DETECTED NOT DETECTED Final   Rhinovirus / Enterovirus NOT DETECTED NOT DETECTED Final   Influenza A NOT DETECTED NOT DETECTED Final   Influenza B NOT DETECTED NOT DETECTED Final   Parainfluenza Virus 1 NOT DETECTED NOT DETECTED Final   Parainfluenza Virus 2 NOT DETECTED NOT DETECTED Final   Parainfluenza Virus 3 DETECTED (A) NOT DETECTED Final   Parainfluenza Virus 4 NOT DETECTED NOT DETECTED Final   Respiratory Syncytial Virus NOT DETECTED NOT DETECTED Final   Bordetella pertussis NOT DETECTED NOT DETECTED Final   Bordetella Parapertussis NOT DETECTED NOT DETECTED Final   Chlamydophila pneumoniae NOT DETECTED NOT DETECTED Final   Mycoplasma pneumoniae NOT DETECTED NOT DETECTED Final    Comment: Performed at [facility], [address] [place], [place] [address]         Radiology Studies: CT Angio Chest PE W and/or Wo Contrast  Result Date: 04/11/2023 CLINICAL DATA:  Cough, congestion, hyperglycemia. History of COPD and asthma. Recently diagnosed with CHF. EXAM: CT ANGIOGRAPHY CHEST WITH CONTRAST TECHNIQUE: Multidetector CT imaging of  the chest was performed using the standard protocol during bolus administration of intravenous contrast. Multiplanar CT image reconstructions and MIPs were obtained to evaluate the vascular anatomy. RADIATION DOSE REDUCTION: This exam was performed according to the departmental dose-optimization program which includes automated exposure control, adjustment of the mA and/or kV according to patient size and/or use of iterative reconstruction technique. CONTRAST:  OMNIPAQUE IOHEXOL 350 MG/ML SOLN COMPARISON:  Chest radiograph 04/10/2023 and CT chest 11/28/2020 FINDINGS: Cardiovascular: Satisfactory opacification of the pulmonary arteries to the segmental level. No evidence of pulmonary embolism. Heart size upper limits of normal. No pericardial effusion. Mediastinum/Nodes: No enlarged mediastinal, hilar, or  axillary lymph nodes. Thyroid gland, trachea, and esophagus demonstrate no significant findings. Lungs/Pleura: Predominantly expiratory phase scan. Patchy bilateral ground-glass opacities greatest in the left upper lobe. Left lower lobe atelectasis. No pleural effusion or pneumothorax. Upper Abdomen: No acute abnormality. Musculoskeletal: No chest wall abnormality. No acute osseous findings. Review of the MIP images confirms the above findings. IMPRESSION: 1. No evidence of pulmonary embolism. 2. Patchy bilateral ground-glass opacities greatest in the left upper lobe, favor atypical infection versus edema. Electronically Signed   By: Minerva Fester M.D.   On: 04/11/2023 02:29   DG Chest 2 View  Result Date: 04/10/2023 CLINICAL DATA:  Shortness of breath EXAM: CHEST - 2 VIEW COMPARISON:  Chest radiograph dated 11/22/2021 FINDINGS: Normal lung volumes. Bibasilar patchy opacities. No pleural effusion or pneumothorax. The heart size and mediastinal contours are within normal limits. No acute osseous abnormality. IMPRESSION: Bibasilar patchy opacities, which may represent atelectasis or infection.  Electronically Signed   By: Agustin Cree M.D.   On: 04/10/2023 21:31           LOS: 0 days   Time spent= 35 mins    Fantashia Shupert Joline Maxcy, MD [facility]  If 7PM-7AM, please contact night-coverage  04/12/2023, 8:12 AM

## 2023-04-13 DIAGNOSIS — J122 Parainfluenza virus pneumonia: Secondary | ICD-10-CM | POA: Diagnosis not present

## 2023-04-13 LAB — CBC
HCT: 40.4 % (ref 36.0–46.0)
Hemoglobin: 12.6 g/dL (ref 12.0–15.0)
MCH: 29.8 pg (ref 26.0–34.0)
MCHC: 31.2 g/dL (ref 30.0–36.0)
MCV: 95.5 fL (ref 80.0–100.0)
Platelets: 305 10*3/uL (ref 150–400)
RBC: 4.23 MIL/uL (ref 3.87–5.11)
RDW: 13.9 % (ref 11.5–15.5)
WBC: 12 10*3/uL — ABNORMAL HIGH (ref 4.0–10.5)
nRBC: 0 % (ref 0.0–0.2)

## 2023-04-13 LAB — BASIC METABOLIC PANEL
Anion gap: 10 (ref 5–15)
BUN: 12 mg/dL (ref 6–20)
CO2: 29 mmol/L (ref 22–32)
Calcium: 8.8 mg/dL — ABNORMAL LOW (ref 8.9–10.3)
Chloride: 95 mmol/L — ABNORMAL LOW (ref 98–111)
Creatinine, Ser: 0.97 mg/dL (ref 0.44–1.00)
GFR, Estimated: >60 mL/min (ref >60)
Glucose, Bld: 341 mg/dL — ABNORMAL HIGH (ref 70–99)
Potassium: 5.5 mmol/L — ABNORMAL HIGH (ref 3.5–5.1)
Sodium: 134 mmol/L — ABNORMAL LOW (ref 135–145)

## 2023-04-13 LAB — GLUCOSE, CAPILLARY
Glucose-Capillary: 232 mg/dL — ABNORMAL HIGH (ref 70–99)
Glucose-Capillary: 252 mg/dL — ABNORMAL HIGH (ref 70–99)
Glucose-Capillary: 432 mg/dL — ABNORMAL HIGH (ref 70–99)
Glucose-Capillary: 392 mg/dL — ABNORMAL HIGH (ref 70–99)

## 2023-04-13 LAB — MAGNESIUM: Magnesium: 2 mg/dL (ref 1.7–2.4)

## 2023-04-13 MED ORDER — SODIUM ZIRCONIUM CYCLOSILICATE 10 G PO PACK
10.0000 g | PACK | Freq: Two times a day (BID) | ORAL | Status: AC
  Administered 2023-04-13: 10 g via ORAL
  Filled 2023-04-13 (×2): qty 1

## 2023-04-13 MED ORDER — IPRATROPIUM-ALBUTEROL 0.5-2.5 (3) MG/3ML IN SOLN
3.0000 mL | Freq: Three times a day (TID) | RESPIRATORY_TRACT | Status: DC
  Administered 2023-04-13 – 2023-04-14 (×3): 3 mL via RESPIRATORY_TRACT
  Filled 2023-04-13 (×2): qty 3

## 2023-04-13 MED ORDER — INSULIN GLARGINE-YFGN 100 UNIT/ML ~~LOC~~ SOLN
30.0000 [IU] | Freq: Every day | SUBCUTANEOUS | Status: DC
  Administered 2023-04-13: 30 [IU] via SUBCUTANEOUS
  Filled 2023-04-13 (×2): qty 0.3

## 2023-04-13 MED ORDER — METHYLPREDNISOLONE SODIUM SUCC 125 MG IJ SOLR
80.0000 mg | Freq: Every day | INTRAMUSCULAR | Status: DC
  Administered 2023-04-13 – 2023-04-15 (×3): 80 mg via INTRAVENOUS
  Filled 2023-04-13 (×3): qty 2

## 2023-04-13 MED ORDER — INSULIN ASPART 100 UNIT/ML IJ SOLN
5.0000 [IU] | Freq: Three times a day (TID) | INTRAMUSCULAR | Status: DC
  Administered 2023-04-13 (×2): 5 [IU] via SUBCUTANEOUS

## 2023-04-13 NOTE — Progress Notes (Signed)
PROGRESS NOTE    Cathy Hall  XBJ:[id number] DOB: 1971-03-10 DOA: 04/10/2023 PCP: Patient, No Pcp Per   Brief Narrative:  52 year old with history of diastolic CHF, polysubstance abuse, morbid obesity, COPD, CVA, HTN, DM 2 admitted to the hospital for shortness of breath.  Patient was found to have parainfluenza a positive.  Procalcitonin negative therefore stopping antibiotics.   Assessment & Plan:  Principal Problem:   Parainfluenza pneumonia Active Problems:   Acute on chronic diastolic CHF (congestive heart failure) (HCC)   Essential hypertension   Obesity, Class III, BMI 40-49.9 (morbid obesity) (HCC)   PVD (peripheral vascular disease) (HCC)   Chronic obstructive pulmonary disease (HCC)   Dyslipidemia (high LDL; low HDL)   Polysubstance abuse (HCC)   Left upper lobe pneumonia    Acute respiratory distress Parainfluenza pneumonia, acute bronchitis Acute  COPD/asthma Still has significant rhonchi with exertional dyspnea.  Procalcitonin is negative.  Respiratory panel positive for parainfluenza infection.  Discontinue antibiotic.  I-S/flutter valve.  Aggressive bronchodilators scheduled and as needed.  Change prednisone to IV Solu-Medrol  Hyperkalemia - Likely from steroids.  Will check EKG.  Lokelma X2 doses   Acute on chronic diastolic CHF, resolved - Somewhat appears to be euvolemic, transition to home daily p.o. Lasix.      Cerebrovascular disease Hypertension Hyperlipidemia - Continue atorvasatatin, Plavix.  Continue home ARB     Diabetes mellitus type II, insulin-dependent, uncontrolled hyperglycemia secondary to steroids Peripheral neuropathy Holding metformin.  Increase Semglee to 30 units daily.  Add Premeal NovoLog..  Sliding scale and Accu-Cheks.  Will continue to adjust as needed Continue gabapentin  History of depression/anxiety - Continue home Prozac  Will benefit outpatient sleep study   DVT prophylaxis: Lovenox Code Status: Full  code Family Communication:   Still has significant abnormal breath sounds.  Continue hospital stay Given patient's comorbidities including morbid obesity, very slow to improve     Diet Orders (From admission, onward)     Start     Ordered   04/11/23 0743  Diet heart healthy/carb modified Room service appropriate? Yes; Fluid consistency: Thin  Diet effective now       Question Answer Comment  Diet-HS Snack? Nothing   Room service appropriate? Yes   Fluid consistency: Thin      04/11/23 0742            Subjective: Seen and examined at bedside.  Still has significant abnormal breath sounds and coughing.  Reports of some exertional dyspnea.  Examination: Constitutional: Not in acute distress, morbid obesity Respiratory: Diffuse bilateral rhonchi Cardiovascular: Normal sinus rhythm, no rubs Abdomen: Nontender nondistended good bowel sounds Musculoskeletal: No edema noted Skin: No rashes seen Neurologic: CN 2-12 grossly intact.  And nonfocal Psychiatric: Normal judgment and insight. Alert and oriented x 3. Normal mood.   Objective: Vitals:   04/12/23 0831 04/12/23 1953 04/12/23 2030 04/13/23 0538  BP:   (!) 128/90 134/73  Pulse:   99 95  Resp:   16 20  Temp:   97.9 F (36.6 C) 98.1 F (36.7 C)  TempSrc:   Oral Oral  SpO2: 97% 100% 96% 99%  Weight:        Intake/Output Summary (Last 24 hours) at 04/13/2023 0749 Last data filed at 04/13/2023 0500 Gross per 24 hour  Intake --  Output 2100 ml  Net -2100 ml   Filed Weights   04/10/23 2102 04/12/23 0359  Weight: 122.5 kg 121.6 kg    Scheduled Meds:  atorvastatin  40  mg Oral Daily   budesonide (PULMICORT) nebulizer solution  0.25 mg Nebulization BID   clopidogrel  75 mg Oral Daily   enoxaparin (LOVENOX) injection  40 mg Subcutaneous Q24H   feeding supplement  237 mL Oral BID BM   FLUoxetine  40 mg Oral Daily   furosemide  20 mg Oral Daily   gabapentin  900 mg Oral TID   guaiFENesin  600 mg Oral BID   insulin  aspart  0-20 Units Subcutaneous TID WC   insulin aspart  0-5 Units Subcutaneous QHS   insulin aspart  5 Units Subcutaneous TID WC   insulin glargine-yfgn  30 Units Subcutaneous Q2200   ipratropium-albuterol  3 mL Nebulization BID   irbesartan  75 mg Oral Daily   living well with diabetes book   Does not apply Once   mometasone-formoterol  2 puff Inhalation BID   montelukast  10 mg Oral QHS   pantoprazole  40 mg Oral Daily   predniSONE  40 mg Oral Q breakfast   sodium chloride flush  3 mL Intravenous Q12H   sodium zirconium cyclosilicate  10 g Oral BID   Continuous Infusions:  Nutritional status     Body mass index is 50.64 kg/m.  Data Reviewed:   CBC: Recent Labs  Lab 04/10/23 2112 04/11/23 1258 04/12/23 0238 04/13/23 0230  WBC 10.6* 10.6* 9.4 12.0*  HGB 13.4 12.8 12.8 12.6  HCT 41.3 39.8 40.7 40.4  MCV 91.6 91.9 95.5 95.5  PLT 302 280 281 305   Basic Metabolic Panel: Recent Labs  Lab 04/10/23 2112 04/11/23 1258 04/12/23 0238 04/13/23 0230  NA 138  --  137 134*  K 3.9  --  4.0 5.5*  CL 101  --  99 95*  CO2 23  --  27 29  GLUCOSE 172*  --  200* 341*  BUN 12  --  12 12  CREATININE 0.96 1.01* 1.07* 0.97  CALCIUM 9.3  --  8.7* 8.8*  MG  --   --   --  2.0   GFR: Estimated Creatinine Clearance: 84.7 mL/min (by C-G formula based on SCr of 0.97 mg/dL). Liver Function Tests: No results for input(s): "AST", "ALT", "ALKPHOS", "BILITOT", "PROT", "ALBUMIN" in the last 168 hours. No results for input(s): "LIPASE", "AMYLASE" in the last 168 hours. No results for input(s): "AMMONIA" in the last 168 hours. Coagulation Profile: No results for input(s): "INR", "PROTIME" in the last 168 hours. Cardiac Enzymes: No results for input(s): "CKTOTAL", "CKMB", "CKMBINDEX", "TROPONINI" in the last 168 hours. BNP (last 3 results) No results for input(s): "PROBNP" in the last 8760 hours. HbA1C: Recent Labs    04/11/23 1258  HGBA1C 8.2*   CBG: Recent Labs  Lab  04/11/23 2131 04/12/23 0758 04/12/23 1226 04/12/23 1756 04/12/23 2106  GLUCAP 200* 202* 153* 164* 234*   Lipid Profile: No results for input(s): "CHOL", "HDL", "LDLCALC", "TRIG", "CHOLHDL", "LDLDIRECT" in the last 72 hours. Thyroid Function Tests: No results for input(s): "TSH", "T4TOTAL", "FREET4", "T3FREE", "THYROIDAB" in the last 72 hours. Anemia Panel: No results for input(s): "VITAMINB12", "FOLATE", "FERRITIN", "TIBC", "IRON", "RETICCTPCT" in the last 72 hours. Sepsis Labs: Recent Labs  Lab 04/11/23 1258 04/12/23 0238  PROCALCITON <0.10 <0.10    Recent Results (from the past 240 hour(s))  Resp panel by RT-PCR (RSV, Flu A&B, Covid) Anterior Nasal Swab     Status: None   Collection Time: 04/10/23  9:06 PM   Specimen: Anterior Nasal Swab  Result Value Ref Range Status  SARS Coronavirus 2 by RT PCR NEGATIVE NEGATIVE Final   Influenza A by PCR NEGATIVE NEGATIVE Final   Influenza B by PCR NEGATIVE NEGATIVE Final    Comment: (NOTE) The Xpert Xpress SARS-CoV-2/FLU/RSV plus assay is intended as an aid in the diagnosis of influenza from Nasopharyngeal swab specimens and should not be used as a sole basis for treatment. Nasal washings and aspirates are unacceptable for Xpert Xpress SARS-CoV-2/FLU/RSV testing.  Fact Sheet for Patients: BloggerCourse.com  Fact Sheet for Healthcare Providers: SeriousBroker.it  This test is not yet approved or cleared by the [place] FDA and has been authorized for detection and/or diagnosis of SARS-CoV-2 by FDA under an Emergency Use Authorization (EUA). This EUA will remain in effect (meaning this test can be used) for the duration of the COVID-19 declaration under Section 564(b)(1) of the Act, 21 U.S.C. section 360bbb-3(b)(1), unless the authorization is terminated or revoked.     Resp Syncytial Virus by PCR NEGATIVE NEGATIVE Final    Comment: (NOTE) Fact Sheet for  Patients: BloggerCourse.com  Fact Sheet for Healthcare Providers: SeriousBroker.it  This test is not yet approved or cleared by the [place] FDA and has been authorized for detection and/or diagnosis of SARS-CoV-2 by FDA under an Emergency Use Authorization (EUA). This EUA will remain in effect (meaning this test can be used) for the duration of the COVID-19 declaration under Section 564(b)(1) of the Act, 21 U.S.C. section 360bbb-3(b)(1), unless the authorization is terminated or revoked.  Performed at [facility], [address] [place], [place] [address]   Respiratory (~20 pathogens) panel by PCR     Status: Abnormal   Collection Time: 04/10/23  9:06 PM   Specimen: Nasopharyngeal Swab; Respiratory  Result Value Ref Range Status   Adenovirus NOT DETECTED NOT DETECTED Final   Coronavirus 229E NOT DETECTED NOT DETECTED Final    Comment: (NOTE) The Coronavirus on the Respiratory Panel, DOES NOT test for the novel  Coronavirus (2019 nCoV)    Coronavirus HKU1 NOT DETECTED NOT DETECTED Final   Coronavirus NL63 NOT DETECTED NOT DETECTED Final   Coronavirus OC43 NOT DETECTED NOT DETECTED Final   Metapneumovirus NOT DETECTED NOT DETECTED Final   Rhinovirus / Enterovirus NOT DETECTED NOT DETECTED Final   Influenza A NOT DETECTED NOT DETECTED Final   Influenza B NOT DETECTED NOT DETECTED Final   Parainfluenza Virus 1 NOT DETECTED NOT DETECTED Final   Parainfluenza Virus 2 NOT DETECTED NOT DETECTED Final   Parainfluenza Virus 3 DETECTED (A) NOT DETECTED Final   Parainfluenza Virus 4 NOT DETECTED NOT DETECTED Final   Respiratory Syncytial Virus NOT DETECTED NOT DETECTED Final   Bordetella pertussis NOT DETECTED NOT DETECTED Final   Bordetella Parapertussis NOT DETECTED NOT DETECTED Final   Chlamydophila pneumoniae NOT DETECTED NOT DETECTED Final   Mycoplasma pneumoniae NOT DETECTED NOT DETECTED Final    Comment: Performed at  [facility], [address], [place], [place] [address]         Radiology Studies: No results found.         LOS: 0 days   Time spent= 35 mins    Ontario Pettengill Joline Maxcy, MD [facility]  If 7PM-7AM, please contact night-coverage  04/13/2023, 7:49 AM

## 2023-04-13 NOTE — Progress Notes (Signed)
Mobility Specialist Progress Note:   04/13/23 1500  Mobility  Activity Ambulated with assistance in hallway  Level of Assistance Contact guard assist, steadying assist  Assistive Device Four wheel walker  Distance Ambulated (ft) 100 ft  Activity Response Tolerated well  Mobility Referral Yes  $Mobility charge 1 Mobility  Mobility Specialist Start Time (ACUTE ONLY) 1500  Mobility Specialist Stop Time (ACUTE ONLY) 1512  Mobility Specialist Time Calculation (min) (ACUTE ONLY) 12 min   Pre Mobility: 94% 4LO2 During Mobility: 90% 4LO2  Pt received on 4LO2, agreeable to mobility session. Required minG assist for safety d/t impulsivity, otherwise no physical assistance. X2 seated rest breaks taken d/t fatigue. Pt back in bed with all needs met, eating lunch. Encouraged frequent ambulation, pt agrees.   Addison Lank Mobility Specialist Please contact via SecureChat or  Rehab office at [phone]

## 2023-04-14 DIAGNOSIS — F32A Depression, unspecified: Secondary | ICD-10-CM | POA: Diagnosis present

## 2023-04-14 DIAGNOSIS — G894 Chronic pain syndrome: Secondary | ICD-10-CM | POA: Diagnosis present

## 2023-04-14 DIAGNOSIS — Z79899 Other long term (current) drug therapy: Secondary | ICD-10-CM | POA: Diagnosis not present

## 2023-04-14 DIAGNOSIS — N179 Acute kidney failure, unspecified: Secondary | ICD-10-CM | POA: Diagnosis not present

## 2023-04-14 DIAGNOSIS — E662 Morbid (severe) obesity with alveolar hypoventilation: Secondary | ICD-10-CM | POA: Diagnosis not present

## 2023-04-14 DIAGNOSIS — Z1152 Encounter for screening for COVID-19: Secondary | ICD-10-CM | POA: Diagnosis not present

## 2023-04-14 DIAGNOSIS — J441 Chronic obstructive pulmonary disease with (acute) exacerbation: Secondary | ICD-10-CM | POA: Diagnosis not present

## 2023-04-14 DIAGNOSIS — E875 Hyperkalemia: Secondary | ICD-10-CM | POA: Diagnosis present

## 2023-04-14 DIAGNOSIS — G9341 Metabolic encephalopathy: Secondary | ICD-10-CM | POA: Diagnosis not present

## 2023-04-14 DIAGNOSIS — T380X5A Adverse effect of glucocorticoids and synthetic analogues, initial encounter: Secondary | ICD-10-CM | POA: Diagnosis present

## 2023-04-14 DIAGNOSIS — J9622 Acute and chronic respiratory failure with hypercapnia: Secondary | ICD-10-CM | POA: Diagnosis not present

## 2023-04-14 DIAGNOSIS — J440 Chronic obstructive pulmonary disease with acute lower respiratory infection: Secondary | ICD-10-CM | POA: Diagnosis not present

## 2023-04-14 DIAGNOSIS — J122 Parainfluenza virus pneumonia: Secondary | ICD-10-CM

## 2023-04-14 DIAGNOSIS — Z794 Long term (current) use of insulin: Secondary | ICD-10-CM | POA: Diagnosis not present

## 2023-04-14 DIAGNOSIS — J9602 Acute respiratory failure with hypercapnia: Secondary | ICD-10-CM | POA: Diagnosis not present

## 2023-04-14 DIAGNOSIS — E1151 Type 2 diabetes mellitus with diabetic peripheral angiopathy without gangrene: Secondary | ICD-10-CM | POA: Diagnosis present

## 2023-04-14 DIAGNOSIS — I5033 Acute on chronic diastolic (congestive) heart failure: Secondary | ICD-10-CM | POA: Diagnosis not present

## 2023-04-14 DIAGNOSIS — E1142 Type 2 diabetes mellitus with diabetic polyneuropathy: Secondary | ICD-10-CM | POA: Diagnosis not present

## 2023-04-14 DIAGNOSIS — E785 Hyperlipidemia, unspecified: Secondary | ICD-10-CM | POA: Diagnosis present

## 2023-04-14 DIAGNOSIS — E1165 Type 2 diabetes mellitus with hyperglycemia: Secondary | ICD-10-CM | POA: Diagnosis present

## 2023-04-14 DIAGNOSIS — J45901 Unspecified asthma with (acute) exacerbation: Secondary | ICD-10-CM | POA: Diagnosis not present

## 2023-04-14 DIAGNOSIS — F17210 Nicotine dependence, cigarettes, uncomplicated: Secondary | ICD-10-CM | POA: Diagnosis present

## 2023-04-14 DIAGNOSIS — R0609 Other forms of dyspnea: Secondary | ICD-10-CM | POA: Diagnosis not present

## 2023-04-14 DIAGNOSIS — Z6841 Body Mass Index (BMI) 40.0 and over, adult: Secondary | ICD-10-CM | POA: Diagnosis not present

## 2023-04-14 DIAGNOSIS — J9621 Acute and chronic respiratory failure with hypoxia: Secondary | ICD-10-CM | POA: Diagnosis not present

## 2023-04-14 DIAGNOSIS — R0602 Shortness of breath: Secondary | ICD-10-CM | POA: Diagnosis present

## 2023-04-14 DIAGNOSIS — I110 Hypertensive heart disease with heart failure: Secondary | ICD-10-CM | POA: Diagnosis not present

## 2023-04-14 DIAGNOSIS — J9601 Acute respiratory failure with hypoxia: Secondary | ICD-10-CM | POA: Diagnosis not present

## 2023-04-14 LAB — CBC
HCT: 40.6 % (ref 36.0–46.0)
Hemoglobin: 12.5 g/dL (ref 12.0–15.0)
MCH: 29.1 pg (ref 26.0–34.0)
MCHC: 30.8 g/dL (ref 30.0–36.0)
MCV: 94.4 fL (ref 80.0–100.0)
Platelets: 330 10*3/uL (ref 150–400)
RBC: 4.3 MIL/uL (ref 3.87–5.11)
RDW: 13.6 % (ref 11.5–15.5)
WBC: 15 10*3/uL — ABNORMAL HIGH (ref 4.0–10.5)
nRBC: 0 % (ref 0.0–0.2)

## 2023-04-14 LAB — BASIC METABOLIC PANEL
Anion gap: 9 (ref 5–15)
BUN: 12 mg/dL (ref 6–20)
CO2: 32 mmol/L (ref 22–32)
Calcium: 9.1 mg/dL (ref 8.9–10.3)
Chloride: 94 mmol/L — ABNORMAL LOW (ref 98–111)
Creatinine, Ser: 0.94 mg/dL (ref 0.44–1.00)
GFR, Estimated: >60 mL/min (ref >60)
Glucose, Bld: 295 mg/dL — ABNORMAL HIGH (ref 70–99)
Potassium: 5 mmol/L (ref 3.5–5.1)
Sodium: 135 mmol/L (ref 135–145)

## 2023-04-14 LAB — GLUCOSE, CAPILLARY
Glucose-Capillary: 318 mg/dL — ABNORMAL HIGH (ref 70–99)
Glucose-Capillary: 286 mg/dL — ABNORMAL HIGH (ref 70–99)
Glucose-Capillary: 350 mg/dL — ABNORMAL HIGH (ref 70–99)
Glucose-Capillary: 324 mg/dL — ABNORMAL HIGH (ref 70–99)

## 2023-04-14 LAB — MAGNESIUM: Magnesium: 2.2 mg/dL (ref 1.7–2.4)

## 2023-04-14 MED ORDER — IPRATROPIUM-ALBUTEROL 0.5-2.5 (3) MG/3ML IN SOLN
3.0000 mL | Freq: Two times a day (BID) | RESPIRATORY_TRACT | Status: DC
  Administered 2023-04-14 – 2023-04-21 (×14): 3 mL via RESPIRATORY_TRACT
  Filled 2023-04-14 (×14): qty 3

## 2023-04-14 MED ORDER — INSULIN ASPART 100 UNIT/ML IJ SOLN
10.0000 [IU] | Freq: Three times a day (TID) | INTRAMUSCULAR | Status: DC
  Administered 2023-04-14 (×2): 10 [IU] via SUBCUTANEOUS

## 2023-04-14 MED ORDER — INSULIN GLARGINE-YFGN 100 UNIT/ML ~~LOC~~ SOLN
40.0000 [IU] | Freq: Every day | SUBCUTANEOUS | Status: DC
  Administered 2023-04-14: 40 [IU] via SUBCUTANEOUS
  Filled 2023-04-14 (×2): qty 0.4

## 2023-04-14 NOTE — Progress Notes (Signed)
   04/14/23 2005  BiPAP/CPAP/SIPAP  BiPAP/CPAP/SIPAP Pt Type Adult  BiPAP/CPAP/SIPAP DREAMSTATIOND  Reason BIPAP/CPAP not in use Non-compliant  BiPAP/CPAP /SiPAP Vitals  SpO2 95 %

## 2023-04-14 NOTE — Progress Notes (Signed)
PROGRESS NOTE    Cathy Hall  WUJ:[id number] DOB: [birth year] DOA: 04/10/2023 PCP: Patient, No Pcp Per   Brief Narrative:  52 year old with history of diastolic CHF, polysubstance abuse, morbid obesity, COPD, CVA, HTN, DM 2 admitted to the hospital for shortness of breath.  Patient was found to have parainfluenza a positive.  Procalcitonin negative therefore stopping antibiotics.   Assessment & Plan:  Principal Problem:   Parainfluenza pneumonia Active Problems:   Acute on chronic diastolic CHF (congestive heart failure) (HCC)   Essential hypertension   Obesity, Class III, BMI 40-49.9 (morbid obesity) (HCC)   PVD (peripheral vascular disease) (HCC)   Chronic obstructive pulmonary disease (HCC)   Dyslipidemia (high LDL; low HDL)   Polysubstance abuse (HCC)   Left upper lobe pneumonia    Acute respiratory distress Parainfluenza pneumonia, acute bronchitis Acute  COPD/asthma Still has significant rhonchi.  Procalcitonin is negative.  Respiratory panel positive for parainfluenza infection.  Discontinue antibiotic.  I-S/flutter valve.  Aggressive bronchodilators scheduled and as needed.  Change prednisone to IV Solu-Medrol. Out of bed   Hyperkalemia - Improved.  Likely from steroids.  EKG is stable.  S/p Lokelma   Acute on chronic diastolic CHF, resolved - Somewhat appears to be euvolemic, transition to home daily p.o. Lasix.      Cerebrovascular disease Hypertension Hyperlipidemia - Continue atorvasatatin, Plavix.  Continue home ARB     Diabetes mellitus type II, insulin-dependent, uncontrolled hyperglycemia secondary to steroids Peripheral neuropathy Holding metformin.  Increase Semglee to 40 units daily.  Increase Premeal insulin.  Sliding scale and Accu-Cheks.  Will continue to adjust as needed Continue gabapentin  History of depression/anxiety - Continue home Prozac  Will benefit outpatient sleep study   DVT prophylaxis: Lovenox Code Status: Full  code Family Communication: Boyfriend at bedside Still has significant abnormal breath sounds.  Continue hospital stay Given patient's comorbidities including morbid obesity, very slow to improve     Diet Orders (From admission, onward)     Start     Ordered   04/11/23 0743  Diet heart healthy/carb modified Room service appropriate? Yes; Fluid consistency: Thin  Diet effective now       Question Answer Comment  Diet-HS Snack? Nothing   Room service appropriate? Yes   Fluid consistency: Thin      04/11/23 0742            Subjective: Seen and examined at bedside.  Still having quite a bit of coughing.  Exertional dyspnea  Examination: Constitutional: Not in acute distress, morbid obesity Respiratory: Diffuse rhonchi, mild improvement since yesterday Cardiovascular: Normal sinus rhythm, no rubs Abdomen: Nontender nondistended good bowel sounds Musculoskeletal: No edema noted Skin: No rashes seen Neurologic: CN 2-12 grossly intact.  And nonfocal Psychiatric: Normal judgment and insight. Alert and oriented x 3. Normal mood.   Objective: Vitals:   04/13/23 2009 04/13/23 2101 04/14/23 0338 04/14/23 0807  BP: (!) 140/94  105/63   Pulse: (!) 108  99 99  Resp: 19  18 (!) 21  Temp: 98 F (36.7 C)  98.5 F (36.9 C)   TempSrc: Oral  Oral   SpO2: 97% 97%  97%  Weight:       No intake or output data in the 24 hours ending 04/14/23 0901  Filed Weights   04/10/23 2102 04/12/23 0359  Weight: 122.5 kg 121.6 kg    Scheduled Meds:  atorvastatin  40 mg Oral Daily   budesonide (PULMICORT) nebulizer solution  0.25 mg Nebulization BID  clopidogrel  75 mg Oral Daily   enoxaparin (LOVENOX) injection  40 mg Subcutaneous Q24H   feeding supplement  237 mL Oral BID BM   FLUoxetine  40 mg Oral Daily   furosemide  20 mg Oral Daily   gabapentin  900 mg Oral TID   guaiFENesin  600 mg Oral BID   insulin aspart  0-20 Units Subcutaneous TID WC   insulin aspart  0-5 Units Subcutaneous  QHS   insulin aspart  10 Units Subcutaneous TID WC   insulin glargine-yfgn  40 Units Subcutaneous Q2200   ipratropium-albuterol  3 mL Nebulization TID   irbesartan  75 mg Oral Daily   living well with diabetes book   Does not apply Once   methylPREDNISolone (SOLU-MEDROL) injection  80 mg Intravenous Daily   mometasone-formoterol  2 puff Inhalation BID   montelukast  10 mg Oral QHS   pantoprazole  40 mg Oral Daily   sodium chloride flush  3 mL Intravenous Q12H   sodium zirconium cyclosilicate  10 g Oral BID   Continuous Infusions:  Nutritional status     Body mass index is 50.64 kg/m.  Data Reviewed:   CBC: Recent Labs  Lab 04/10/23 2112 04/11/23 1258 04/12/23 0238 04/13/23 0230 04/14/23 0246  WBC 10.6* 10.6* 9.4 12.0* 15.0*  HGB 13.4 12.8 12.8 12.6 12.5  HCT 41.3 39.8 40.7 40.4 40.6  MCV 91.6 91.9 95.5 95.5 94.4  PLT 302 280 281 305 330   Basic Metabolic Panel: Recent Labs  Lab 04/10/23 2112 04/11/23 1258 04/12/23 0238 04/13/23 0230 04/14/23 0246  NA 138  --  137 134* 135  K 3.9  --  4.0 5.5* 5.0  CL 101  --  99 95* 94*  CO2 23  --  27 29 32  GLUCOSE 172*  --  200* 341* 295*  BUN 12  --  12 12 12   CREATININE 0.96 1.01* 1.07* 0.97 0.94  CALCIUM 9.3  --  8.7* 8.8* 9.1  MG  --   --   --  2.0 2.2   GFR: Estimated Creatinine Clearance: 87.4 mL/min (by C-G formula based on SCr of 0.94 mg/dL). Liver Function Tests: No results for input(s): "AST", "ALT", "ALKPHOS", "BILITOT", "PROT", "ALBUMIN" in the last 168 hours. No results for input(s): "LIPASE", "AMYLASE" in the last 168 hours. No results for input(s): "AMMONIA" in the last 168 hours. Coagulation Profile: No results for input(s): "INR", "PROTIME" in the last 168 hours. Cardiac Enzymes: No results for input(s): "CKTOTAL", "CKMB", "CKMBINDEX", "TROPONINI" in the last 168 hours. BNP (last 3 results) No results for input(s): "PROBNP" in the last 8760 hours. HbA1C: Recent Labs    04/11/23 1258  HGBA1C  8.2*   CBG: Recent Labs  Lab 04/13/23 0751 04/13/23 1211 04/13/23 1620 04/13/23 2110 04/14/23 0752  GLUCAP 232* 252* 432* 392* 318*   Lipid Profile: No results for input(s): "CHOL", "HDL", "LDLCALC", "TRIG", "CHOLHDL", "LDLDIRECT" in the last 72 hours. Thyroid Function Tests: No results for input(s): "TSH", "T4TOTAL", "FREET4", "T3FREE", "THYROIDAB" in the last 72 hours. Anemia Panel: No results for input(s): "VITAMINB12", "FOLATE", "FERRITIN", "TIBC", "IRON", "RETICCTPCT" in the last 72 hours. Sepsis Labs: Recent Labs  Lab 04/11/23 1258 04/12/23 0238  PROCALCITON <0.10 <0.10    Recent Results (from the past 240 hour(s))  Resp panel by RT-PCR (RSV, Flu A&B, Covid) Anterior Nasal Swab     Status: None   Collection Time: 04/10/23  9:06 PM   Specimen: Anterior Nasal Swab  Result Value  Ref Range Status   SARS Coronavirus 2 by RT PCR NEGATIVE NEGATIVE Final   Influenza A by PCR NEGATIVE NEGATIVE Final   Influenza B by PCR NEGATIVE NEGATIVE Final    Comment: (NOTE) The Xpert Xpress SARS-CoV-2/FLU/RSV plus assay is intended as an aid in the diagnosis of influenza from Nasopharyngeal swab specimens and should not be used as a sole basis for treatment. Nasal washings and aspirates are unacceptable for Xpert Xpress SARS-CoV-2/FLU/RSV testing.  Fact Sheet for Patients: BloggerCourse.com  Fact Sheet for Healthcare Providers: SeriousBroker.it  This test is not yet approved or cleared by the [place] FDA and has been authorized for detection and/or diagnosis of SARS-CoV-2 by FDA under an Emergency Use Authorization (EUA). This EUA will remain in effect (meaning this test can be used) for the duration of the COVID-19 declaration under Section 564(b)(1) of the Act, 21 U.S.C. section 360bbb-3(b)(1), unless the authorization is terminated or revoked.     Resp Syncytial Virus by PCR NEGATIVE NEGATIVE Final    Comment:  (NOTE) Fact Sheet for Patients: BloggerCourse.com  Fact Sheet for Healthcare Providers: SeriousBroker.it  This test is not yet approved or cleared by the [place] FDA and has been authorized for detection and/or diagnosis of SARS-CoV-2 by FDA under an Emergency Use Authorization (EUA). This EUA will remain in effect (meaning this test can be used) for the duration of the COVID-19 declaration under Section 564(b)(1) of the Act, 21 U.S.C. section 360bbb-3(b)(1), unless the authorization is terminated or revoked.  Performed at [facility], [address] [place], [place] [address]   Respiratory (~20 pathogens) panel by PCR     Status: Abnormal   Collection Time: 04/10/23  9:06 PM   Specimen: Nasopharyngeal Swab; Respiratory  Result Value Ref Range Status   Adenovirus NOT DETECTED NOT DETECTED Final   Coronavirus 229E NOT DETECTED NOT DETECTED Final    Comment: (NOTE) The Coronavirus on the Respiratory Panel, DOES NOT test for the novel  Coronavirus (2019 nCoV)    Coronavirus HKU1 NOT DETECTED NOT DETECTED Final   Coronavirus NL63 NOT DETECTED NOT DETECTED Final   Coronavirus OC43 NOT DETECTED NOT DETECTED Final   Metapneumovirus NOT DETECTED NOT DETECTED Final   Rhinovirus / Enterovirus NOT DETECTED NOT DETECTED Final   Influenza A NOT DETECTED NOT DETECTED Final   Influenza B NOT DETECTED NOT DETECTED Final   Parainfluenza Virus 1 NOT DETECTED NOT DETECTED Final   Parainfluenza Virus 2 NOT DETECTED NOT DETECTED Final   Parainfluenza Virus 3 DETECTED (A) NOT DETECTED Final   Parainfluenza Virus 4 NOT DETECTED NOT DETECTED Final   Respiratory Syncytial Virus NOT DETECTED NOT DETECTED Final   Bordetella pertussis NOT DETECTED NOT DETECTED Final   Bordetella Parapertussis NOT DETECTED NOT DETECTED Final   Chlamydophila pneumoniae NOT DETECTED NOT DETECTED Final   Mycoplasma pneumoniae NOT DETECTED NOT DETECTED Final     Comment: Performed at [facility], [address], [place], [place] [address]         Radiology Studies: No results found.         LOS: 0 days   Time spent= 35 mins    Gusta Marksberry Joline Maxcy, MD [facility]  If 7PM-7AM, please contact night-coverage  04/14/2023, 9:01 AM

## 2023-04-15 ENCOUNTER — Inpatient Hospital Stay (HOSPITAL_COMMUNITY): Payer: Medicaid Other

## 2023-04-15 DIAGNOSIS — I5033 Acute on chronic diastolic (congestive) heart failure: Secondary | ICD-10-CM

## 2023-04-15 DIAGNOSIS — J9602 Acute respiratory failure with hypercapnia: Secondary | ICD-10-CM

## 2023-04-15 DIAGNOSIS — R0609 Other forms of dyspnea: Secondary | ICD-10-CM

## 2023-04-15 DIAGNOSIS — J9601 Acute respiratory failure with hypoxia: Secondary | ICD-10-CM

## 2023-04-15 LAB — CBC
HCT: 44.1 % (ref 36.0–46.0)
Hemoglobin: 13.3 g/dL (ref 12.0–15.0)
MCH: 29.7 pg (ref 26.0–34.0)
MCHC: 30.2 g/dL (ref 30.0–36.0)
MCV: 98.4 fL (ref 80.0–100.0)
Platelets: 338 10*3/uL (ref 150–400)
RBC: 4.48 MIL/uL (ref 3.87–5.11)
RDW: 13.9 % (ref 11.5–15.5)
WBC: 15 10*3/uL — ABNORMAL HIGH (ref 4.0–10.5)
nRBC: 0 % (ref 0.0–0.2)

## 2023-04-15 LAB — BLOOD GAS, ARTERIAL
Acid-Base Excess: 16 mmol/L — ABNORMAL HIGH (ref 0.0–2.0)
Acid-Base Excess: 15.7 mmol/L — ABNORMAL HIGH (ref 0.0–2.0)
Acid-Base Excess: 17.3 mmol/L — ABNORMAL HIGH (ref 0.0–2.0)
Bicarbonate: 47.2 mmol/L — ABNORMAL HIGH (ref 20.0–28.0)
Bicarbonate: 46.4 mmol/L — ABNORMAL HIGH (ref 20.0–28.0)
Bicarbonate: 47.5 mmol/L — ABNORMAL HIGH (ref 20.0–28.0)
Drawn by: 24610
Drawn by: 24610
O2 Saturation: 92.6 %
O2 Saturation: 92.7 %
O2 Saturation: 94.6 %
Patient temperature: 37
Patient temperature: 37
Patient temperature: 37
pCO2 arterial: 96 mmHg (ref 32–48)
pCO2 arterial: 90 mmHg (ref 32–48)
pCO2 arterial: 86 mmHg (ref 32–48)
pH, Arterial: 7.3 — ABNORMAL LOW (ref 7.35–7.45)
pH, Arterial: 7.32 — ABNORMAL LOW (ref 7.35–7.45)
pH, Arterial: 7.35 (ref 7.35–7.45)
pO2, Arterial: 62 mmHg — ABNORMAL LOW (ref 83–108)
pO2, Arterial: 64 mmHg — ABNORMAL LOW (ref 83–108)
pO2, Arterial: 67 mmHg — ABNORMAL LOW (ref 83–108)

## 2023-04-15 LAB — ECHOCARDIOGRAM COMPLETE
Area-P 1/2: 3.99 cm2
Calc EF: 57.9 %
S' Lateral: 2.4 cm
Single Plane A2C EF: 62.4 %
Single Plane A4C EF: 57.6 %
Weight: 4288 oz

## 2023-04-15 LAB — BASIC METABOLIC PANEL
Anion gap: 6 (ref 5–15)
BUN: 14 mg/dL (ref 6–20)
CO2: 40 mmol/L — ABNORMAL HIGH (ref 22–32)
Calcium: 9.7 mg/dL (ref 8.9–10.3)
Chloride: 92 mmol/L — ABNORMAL LOW (ref 98–111)
Creatinine, Ser: 0.93 mg/dL (ref 0.44–1.00)
GFR, Estimated: >60 mL/min (ref >60)
Glucose, Bld: 144 mg/dL — ABNORMAL HIGH (ref 70–99)
Potassium: 5.1 mmol/L (ref 3.5–5.1)
Sodium: 138 mmol/L (ref 135–145)

## 2023-04-15 LAB — GLUCOSE, CAPILLARY
Glucose-Capillary: 158 mg/dL — ABNORMAL HIGH (ref 70–99)
Glucose-Capillary: 214 mg/dL — ABNORMAL HIGH (ref 70–99)
Glucose-Capillary: 275 mg/dL — ABNORMAL HIGH (ref 70–99)
Glucose-Capillary: 200 mg/dL — ABNORMAL HIGH (ref 70–99)
Glucose-Capillary: 225 mg/dL — ABNORMAL HIGH (ref 70–99)

## 2023-04-15 LAB — MAGNESIUM: Magnesium: 2.2 mg/dL (ref 1.7–2.4)

## 2023-04-15 MED ORDER — INSULIN GLARGINE-YFGN 100 UNIT/ML ~~LOC~~ SOLN
45.0000 [IU] | Freq: Every day | SUBCUTANEOUS | Status: DC
  Administered 2023-04-15 – 2023-04-18 (×4): 45 [IU] via SUBCUTANEOUS
  Filled 2023-04-15 (×5): qty 0.45

## 2023-04-15 MED ORDER — ACETAZOLAMIDE SODIUM 500 MG IJ SOLR
500.0000 mg | Freq: Two times a day (BID) | INTRAMUSCULAR | Status: AC
  Administered 2023-04-15 – 2023-04-17 (×4): 500 mg via INTRAVENOUS
  Filled 2023-04-15 (×4): qty 500

## 2023-04-15 MED ORDER — FUROSEMIDE 10 MG/ML IJ SOLN
40.0000 mg | Freq: Once | INTRAMUSCULAR | Status: AC
  Administered 2023-04-15: 40 mg via INTRAVENOUS
  Filled 2023-04-15: qty 4

## 2023-04-15 MED ORDER — ALBUTEROL SULFATE (2.5 MG/3ML) 0.083% IN NEBU: 2.5000 mg | INHALATION_SOLUTION | RESPIRATORY_TRACT | Status: DC | PRN

## 2023-04-15 MED ORDER — INSULIN ASPART 100 UNIT/ML IJ SOLN
12.0000 [IU] | Freq: Three times a day (TID) | INTRAMUSCULAR | Status: DC
  Administered 2023-04-18 – 2023-04-19 (×2): 12 [IU] via SUBCUTANEOUS

## 2023-04-15 MED ORDER — METHYLPREDNISOLONE SODIUM SUCC 40 MG IJ SOLR
40.0000 mg | Freq: Every day | INTRAMUSCULAR | Status: DC
  Administered 2023-04-16 – 2023-04-19 (×4): 40 mg via INTRAVENOUS
  Filled 2023-04-15 (×4): qty 1

## 2023-04-15 NOTE — Progress Notes (Signed)
RN asked for me to place patient on cpap. Currently pt will answers questions but is sleepy.  When I went to place patient on cpap, pt states she is nauseated and feels she may vomit.  Due to this I am unable to place her on cpap.  RN and MD notified.  No respiratory distress currently noted.
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
## 2023-04-15 NOTE — Progress Notes (Signed)
Echocardiogram 2D Echocardiogram has been performed.  Cathy Hall 04/15/2023, 4:27 PM

## 2023-04-15 NOTE — Progress Notes (Signed)
I was notified by the nursing staff the patient had obvious signs of confusion/drowsiness.  She was at that time slightly difficult to arouse. Stat ABG performed showed CO2 narcosis with pCO2 of 96 and pH of 7.3. Patient also reported of some nausea and vomitus.  Abdominal x-ray was unremarkable. After patient being little stimulated she was slightly more responsive and arousable.  Repeat ABG showed pCO2 of 90 with pH of 7.32.  BiPAP was placed with the anticipation we can have a sitter in the room to monitor the patient.  If patient continues to tolerate BiPAP well, we can really save her from getting intubated.  She does remain high risk for intubation.  I have advised patient to sit as upright as possible. Consult pulmonary team as well for any additional input and especially because a low threshold for higher level of care.  Stephania Fragmin MD [facility]

## 2023-04-15 NOTE — Progress Notes (Signed)
PROGRESS NOTE    Cathy Hall  GNF:[id number] DOB: Mar 21, 1971 DOA: 04/10/2023 PCP: Patient, No Pcp Per   Brief Narrative:  52 year old with history of diastolic CHF, polysubstance abuse, morbid obesity, COPD, CVA, HTN, DM 2 admitted to the hospital for shortness of breath.  Patient was found to have parainfluenza a positive.  Procalcitonin negative therefore stopping antibiotics.  Patient was started on steroids, aggressive bronchodilators.  Hospital course complicated by CO2 narcosis   Assessment & Plan:  Principal Problem:   Parainfluenza pneumonia Active Problems:   Acute on chronic diastolic CHF (congestive heart failure) (HCC)   Essential hypertension   Obesity, Class III, BMI 40-49.9 (morbid obesity) (HCC)   PVD (peripheral vascular disease) (HCC)   Chronic obstructive pulmonary disease (HCC)   Dyslipidemia (high LDL; low HDL)   Polysubstance abuse (HCC)   Left upper lobe pneumonia   COPD exacerbation (HCC)    Acute mild encephalopathy Drowsy - Suspect secondary to CO2 narcosis.  Elevated pCO2.  Will attempt BiPAP if patient is able to tolerate otherwise will consult PCCM   Acute respiratory distress Parainfluenza pneumonia, acute bronchitis Acute  COPD/asthma Still has significant rhonchi.  Procalcitonin is negative.  Respiratory panel positive for parainfluenza infection.  Discontinue antibiotic.  I-S/flutter valve.  Aggressive bronchodilators scheduled and as needed.  Her wheezing has improved therefore we will slowly reduce Solu-Medrol.  Hyperkalemia - Improved.  Likely from steroids.  EKG is stable.  S/p Lokelma   Acute on chronic diastolic CHF, resolved - Somewhat appears to be euvolemic, transition to home daily p.o. Lasix.      Cerebrovascular disease Hypertension Hyperlipidemia - Continue atorvasatatin, Plavix.  Continue home ARB     Diabetes mellitus type II, insulin-dependent, uncontrolled hyperglycemia secondary to steroids Peripheral  neuropathy Holding metformin.  Increase Semglee to 42 units daily.  Increase Premeal insulin.  Sliding scale and Accu-Cheks.  Will continue to adjust as needed Continue gabapentin  History of depression/anxiety - Continue home Prozac  Will benefit outpatient sleep study   DVT prophylaxis: Lovenox Code Status: Full code Family Communication: Boyfriend at bedside Still has significant abnormal breath sounds.  Continue hospital stay Given patient's comorbidities including morbid obesity, very slow to improve     Diet Orders (From admission, onward)     Start     Ordered   04/11/23 0743  Diet heart healthy/carb modified Room service appropriate? Yes; Fluid consistency: Thin  Diet effective now       Question Answer Comment  Diet-HS Snack? Nothing   Room service appropriate? Yes   Fluid consistency: Thin      04/11/23 0742            Subjective: Easily arousable but very drowsy.  Shortness of breath improved. ABG performed showed CO2 narcosis.  Examination: Constitutional: Not in acute distress, morbid obesity Respiratory: Bilateral diminished breath sounds-very minimal. Cardiovascular: Normal sinus rhythm, no rubs Abdomen: Nontender nondistended good bowel sounds Musculoskeletal: No edema noted Skin: No rashes seen Neurologic: CN 2-12 grossly intact.  And nonfocal Psychiatric: Normal judgment and insight. Alert and oriented x 3. Normal mood.  Objective: Vitals:   04/14/23 2005 04/15/23 0533 04/15/23 1132 04/15/23 1245  BP:  (!) 153/106 (!) 162/138 (!) 103/54  Pulse:  96 95 96  Resp:  20  20  Temp:  97.8 F (36.6 C)  98.1 F (36.7 C)  TempSrc:  Axillary  Oral  SpO2: 95% 97% 98% 95%  Weight:        Intake/Output Summary (Last  24 hours) at 04/15/2023 1251 Last data filed at 04/15/2023 0500 Gross per 24 hour  Intake --  Output 2450 ml  Net -2450 ml    Filed Weights   04/10/23 2102 04/12/23 0359  Weight: 122.5 kg 121.6 kg    Scheduled Meds:   atorvastatin  40 mg Oral Daily   budesonide (PULMICORT) nebulizer solution  0.25 mg Nebulization BID   clopidogrel  75 mg Oral Daily   enoxaparin (LOVENOX) injection  40 mg Subcutaneous Q24H   feeding supplement  237 mL Oral BID BM   FLUoxetine  40 mg Oral Daily   gabapentin  900 mg Oral TID   guaiFENesin  600 mg Oral BID   insulin aspart  0-20 Units Subcutaneous TID WC   insulin aspart  0-5 Units Subcutaneous QHS   insulin aspart  12 Units Subcutaneous TID WC   insulin glargine-yfgn  45 Units Subcutaneous Q2200   ipratropium-albuterol  3 mL Nebulization BID   irbesartan  75 mg Oral Daily   living well with diabetes book   Does not apply Once   [START ON 04/16/2023] methylPREDNISolone (SOLU-MEDROL) injection  40 mg Intravenous Daily   mometasone-formoterol  2 puff Inhalation BID   montelukast  10 mg Oral QHS   pantoprazole  40 mg Oral Daily   sodium chloride flush  3 mL Intravenous Q12H   Continuous Infusions:  Nutritional status     Body mass index is 50.64 kg/m.  Data Reviewed:   CBC: Recent Labs  Lab 04/11/23 1258 04/12/23 0238 04/13/23 0230 04/14/23 0246 04/15/23 0300  WBC 10.6* 9.4 12.0* 15.0* 15.0*  HGB 12.8 12.8 12.6 12.5 13.3  HCT 39.8 40.7 40.4 40.6 44.1  MCV 91.9 95.5 95.5 94.4 98.4  PLT 280 281 305 330 338   Basic Metabolic Panel: Recent Labs  Lab 04/10/23 2112 04/11/23 1258 04/12/23 0238 04/13/23 0230 04/14/23 0246 04/15/23 0300  NA 138  --  137 134* 135 138  K 3.9  --  4.0 5.5* 5.0 5.1  CL 101  --  99 95* 94* 92*  CO2 23  --  27 29 32 40*  GLUCOSE 172*  --  200* 341* 295* 144*  BUN 12  --  12 12 12 14   CREATININE 0.96 1.01* 1.07* 0.97 0.94 0.93  CALCIUM 9.3  --  8.7* 8.8* 9.1 9.7  MG  --   --   --  2.0 2.2 2.2   GFR: Estimated Creatinine Clearance: 88.3 mL/min (by C-G formula based on SCr of 0.93 mg/dL). Liver Function Tests: No results for input(s): "AST", "ALT", "ALKPHOS", "BILITOT", "PROT", "ALBUMIN" in the last 168 hours. No results  for input(s): "LIPASE", "AMYLASE" in the last 168 hours. No results for input(s): "AMMONIA" in the last 168 hours. Coagulation Profile: No results for input(s): "INR", "PROTIME" in the last 168 hours. Cardiac Enzymes: No results for input(s): "CKTOTAL", "CKMB", "CKMBINDEX", "TROPONINI" in the last 168 hours. BNP (last 3 results) No results for input(s): "PROBNP" in the last 8760 hours. HbA1C: No results for input(s): "HGBA1C" in the last 72 hours.  CBG: Recent Labs  Lab 04/14/23 1124 04/14/23 1716 04/14/23 2152 04/15/23 0751 04/15/23 1244  GLUCAP 286* 350* 324* 158* 214*   Lipid Profile: No results for input(s): "CHOL", "HDL", "LDLCALC", "TRIG", "CHOLHDL", "LDLDIRECT" in the last 72 hours. Thyroid Function Tests: No results for input(s): "TSH", "T4TOTAL", "FREET4", "T3FREE", "THYROIDAB" in the last 72 hours. Anemia Panel: No results for input(s): "VITAMINB12", "FOLATE", "FERRITIN", "TIBC", "IRON", "RETICCTPCT" in  the last 72 hours. Sepsis Labs: Recent Labs  Lab 04/11/23 1258 04/12/23 0238  PROCALCITON <0.10 <0.10    Recent Results (from the past 240 hour(s))  Resp panel by RT-PCR (RSV, Flu A&B, Covid) Anterior Nasal Swab     Status: None   Collection Time: 04/10/23  9:06 PM   Specimen: Anterior Nasal Swab  Result Value Ref Range Status   SARS Coronavirus 2 by RT PCR NEGATIVE NEGATIVE Final   Influenza A by PCR NEGATIVE NEGATIVE Final   Influenza B by PCR NEGATIVE NEGATIVE Final    Comment: (NOTE) The Xpert Xpress SARS-CoV-2/FLU/RSV plus assay is intended as an aid in the diagnosis of influenza from Nasopharyngeal swab specimens and should not be used as a sole basis for treatment. Nasal washings and aspirates are unacceptable for Xpert Xpress SARS-CoV-2/FLU/RSV testing.  Fact Sheet for Patients: BloggerCourse.com  Fact Sheet for Healthcare Providers: SeriousBroker.it  This test is not yet approved or cleared by  the [place] FDA and has been authorized for detection and/or diagnosis of SARS-CoV-2 by FDA under an Emergency Use Authorization (EUA). This EUA will remain in effect (meaning this test can be used) for the duration of the COVID-19 declaration under Section 564(b)(1) of the Act, 21 U.S.C. section 360bbb-3(b)(1), unless the authorization is terminated or revoked.     Resp Syncytial Virus by PCR NEGATIVE NEGATIVE Final    Comment: (NOTE) Fact Sheet for Patients: BloggerCourse.com  Fact Sheet for Healthcare Providers: SeriousBroker.it  This test is not yet approved or cleared by the [place] FDA and has been authorized for detection and/or diagnosis of SARS-CoV-2 by FDA under an Emergency Use Authorization (EUA). This EUA will remain in effect (meaning this test can be used) for the duration of the COVID-19 declaration under Section 564(b)(1) of the Act, 21 U.S.C. section 360bbb-3(b)(1), unless the authorization is terminated or revoked.  Performed at [facility], [address] [place], [place] [address]   Respiratory (~20 pathogens) panel by PCR     Status: Abnormal   Collection Time: 04/10/23  9:06 PM   Specimen: Nasopharyngeal Swab; Respiratory  Result Value Ref Range Status   Adenovirus NOT DETECTED NOT DETECTED Final   Coronavirus 229E NOT DETECTED NOT DETECTED Final    Comment: (NOTE) The Coronavirus on the Respiratory Panel, DOES NOT test for the novel  Coronavirus (2019 nCoV)    Coronavirus HKU1 NOT DETECTED NOT DETECTED Final   Coronavirus NL63 NOT DETECTED NOT DETECTED Final   Coronavirus OC43 NOT DETECTED NOT DETECTED Final   Metapneumovirus NOT DETECTED NOT DETECTED Final   Rhinovirus / Enterovirus NOT DETECTED NOT DETECTED Final   Influenza A NOT DETECTED NOT DETECTED Final   Influenza B NOT DETECTED NOT DETECTED Final   Parainfluenza Virus 1 NOT DETECTED NOT DETECTED Final   Parainfluenza  Virus 2 NOT DETECTED NOT DETECTED Final   Parainfluenza Virus 3 DETECTED (A) NOT DETECTED Final   Parainfluenza Virus 4 NOT DETECTED NOT DETECTED Final   Respiratory Syncytial Virus NOT DETECTED NOT DETECTED Final   Bordetella pertussis NOT DETECTED NOT DETECTED Final   Bordetella Parapertussis NOT DETECTED NOT DETECTED Final   Chlamydophila pneumoniae NOT DETECTED NOT DETECTED Final   Mycoplasma pneumoniae NOT DETECTED NOT DETECTED Final    Comment: Performed at [facility], [address], [place], [place] [address]         Radiology Studies: No results found.         LOS: 1 day  Time spent= 35 mins    Ladarrius Bogdanski Joline Maxcy, MD [facility]  If 7PM-7AM, please contact night-coverage  04/15/2023, 12:51 PM

## 2023-04-15 NOTE — Progress Notes (Signed)
MD notified of repeat ABG results.  No new RT orders rec'd at this time.  No distress noted, pt does appear more alert.  There is a Comptroller at bedside currently.

## 2023-04-15 NOTE — Progress Notes (Signed)
OT Cancellation Note  Patient Details Name: Cathy Hall MRN: [id number] DOB: 1971/08/29   Cancelled Treatment:    Reason Eval/Treat Not Completed: Patient not medically ready (nursing asked OT to hold treatment today due to patient on Bipap, nauseaus, and has increased confusion.) Alfonse Flavors, OTA Acute Rehabilitation Services  Office [phone]  [name] 04/15/2023, 2:35 PM

## 2023-04-15 NOTE — Consult Note (Signed)
NAME:  Cathy Hall, MRN:  [id number], DOB:  24-May-1971, LOS: 1 ADMISSION DATE:  04/10/2023, CONSULTATION DATE:  04/15/2023 REFERRING MD:  Nelson Chimes , CHIEF COMPLAINT:  Dyspnea    History of Present Illness:   Cathy Hall is a 52 y.o. female current every day smoker (Smokes 1 PPD x unknown  years) with medical history significant of morbid obesity with a BMI of 51, COPD/asthma, diabetes, diastolic dysfunction, hypertension, remote CVA and peripheral neuropathy.  Patient presented to the ER with cough congestion and elevated blood glucose.  She was hospitalized with exacerbation of HFpEF April 2024 in [place].  Patient was discharged on appropriate medications but has not taken Lasix for 5 days and most of her other medications due to lack of insurance or funds to pay for medicines out-of-pocket.  She states she is now a resident of [address] and living in [place].  Due to the death of a recent family member she has been traveling back and forth between [place] and [place] to handle the estate.  She reports she has been having a strong productive cough with status of nasal secretions and congestion for about 1-1/2 months.  She was instructed to follow-up as an outpatient to obtain a polysomnogram due to significant concerns over having underlying sleep apnea.  She notes that she is aware she stops breathing while sleeping and had much better respiratory symptoms after CPAP was utilized during previous hospitalization.  She reports she is trying to get disability.   Pt. Presented to the ED 04/10/2023 . She was febrile at the time, slightly tachypneic and tachycardic with elevated blood pressure 179/100. Sats on  room  were 96% but dropped down to 87% when supine or while asleep. Labs were unremarkable except modestly elevated troponin of 24 and 22. Viral PCR for flu, RSV and COVID were negative. Para-influenza Virus 3 was positive. hCG was less than 5. Chest  x-ray revealed bibasilar patchy opacities which could be either atelectasis or infection. Given her history of recurrent car travel in context of morbid obesity CTA of the chest to rule out PE was completed. No evidence of PE but there were patchy bilateral groundglass opacities greatest in the left upper lobe favoring atypical infection versus edema.   PCCM were asked to see as a pulmonary / ICU consult for Acute on Chronic Hypercapnic respiratory failure.   ABG's concerning for worsening hypercapnic failure>> 7/32/ 90/64/46.4/92.7% sat, After this ABG patient was placed on BiPAP.   Pertinent  Medical History    Past Medical History:  Diagnosis Date   Anxiety    Asthma    COPD (chronic obstructive pulmonary disease) (HCC)    Depression    Hypertension    Major depression    PTSD (post-traumatic stress disorder)      Significant Hospital Events: Including procedures, antibiotic start and stop dates in addition to other pertinent events   04/10/2023 Admission to [facility]  04/15/2023 BiPAP initiation   Interim History / Subjective:   Pt. Currently on BiPAP IPAP 15 EPAP 5 Rate of 20 and 40% ( New settings per Dr. Merrily Pew. )  She was placed on BiPAP about 15 minutes prior to critical care team arriving. She is sleepy , but after stimulation she did wake up,   Imaging /Labs reviewed 6/10 ABG's>> 7/32/ 90/64/46.4/92.7%  Initial pH was 7.3 with CO2 of 96 Mag 2.2, Na 138, K 5.1, Cl 92, CO2 40,Glucose 144,BUN 14, Creatinine 0.93, Ca 9.7 WBC 15,  HGB 13.3, Platelets 338 Net negative 6.4 L T Max 98.3  CTA 04/11/2023>> No PE, Predominantly expiratory phase scan. Patchy bilateral ground-glass opacities greatest in the left upper lobe. Favor atypical infection  Left lower lobe atelectasis. No pleural effusion or pneumothorax.    Objective   Blood pressure (!) 103/54, pulse 96, temperature 98.1 F (36.7 C), temperature source Oral, resp. rate 20, weight 121.6 kg, SpO2 95 %.        Intake/Output  Summary (Last 24 hours) at 04/15/2023 1350 Last data filed at 04/15/2023 0500 Gross per 24 hour  Intake --  Output 2450 ml  Net -2450 ml   Filed Weights   04/10/23 2102 04/12/23 0359  Weight: 122.5 kg 121.6 kg    Examination: General:  Obese patient on BiPAP , sleepy, but easy to arouse, Tachynea HENT: Thick neck, MM pink and moist, unable to assess JVD Lungs: Bilateral chest excursion , very diminished breath sounds, air movement Cardiovascular:  ST per tele, S1, S2, RRR, No audible RMG, distant heart sounds Abdomen:  Obese, NT, slightly distended, BS diminished Extremities:  No obvious deformities, warm to touch, 1+ BLE edema Neuro:  Sleepy , follows commands , answers some questions  GU:  Not assessed  Resolved Hospital Problem list     Assessment & Plan:  Acute on Chronic hypoxic/hypercarbic Respiratory Failure Parainfluenza pneumonia, acute bronchitis Acute  COPD/asthma Current every day smoker  BiPAP at 40%/ IPAP 15/ EPAP 5/ Rate of 20 Repeat ABG showed pH 7.3, pCO2 85 Hopefully will not need intubation, as extubation will be difficult Lasix and acetazolamide as ordered now IS and Flutter Valve when off BiPAP BD as ordered, Pulmicort/ DuoNeb/ Dulera Will add prn albuterol Solumedrol 40 mg daily Continue Singulair at HS Mucinex as ordered CXR and ABG prn ABG in am Smoking cessation counseling Will need BiPAP for home  Will need to be compliant with therapy   Acute on Chronic Heart Failure  Plan Trend BNP Diuresis as ordered, lasix and Zaroxolyn  Strict I&O Echo as ordered to evaluate heart function    Hyperkalemia Plan Trend BMET daily  Treat or replete  electrolytes as needed   Acute Encephalopathy 2/2 hypercarbia Plan BiPAP now Trend ABG  Trend BMET daily  Diabetes mellitus type II, insulin-dependent, uncontrolled hyperglycemia secondary to steroids Peripheral neuropathy Plan Insulin as ordered  Stop gabapentin until more alert   Best  Practice (right click and "Reselect all SmartList Selections" daily)   Diet/type: NPO DVT prophylaxis: LMWH GI prophylaxis: PPI Lines: N/A Foley:  N/A Code Status:  full code Last date of multidisciplinary goals of care discussion [Ongoing]  Labs   CBC: Recent Labs  Lab 04/11/23 1258 04/12/23 0238 04/13/23 0230 04/14/23 0246 04/15/23 0300  WBC 10.6* 9.4 12.0* 15.0* 15.0*  HGB 12.8 12.8 12.6 12.5 13.3  HCT 39.8 40.7 40.4 40.6 44.1  MCV 91.9 95.5 95.5 94.4 98.4  PLT 280 281 305 330 338    Basic Metabolic Panel: Recent Labs  Lab 04/10/23 2112 04/11/23 1258 04/12/23 0238 04/13/23 0230 04/14/23 0246 04/15/23 0300  NA 138  --  137 134* 135 138  K 3.9  --  4.0 5.5* 5.0 5.1  CL 101  --  99 95* 94* 92*  CO2 23  --  27 29 32 40*  GLUCOSE 172*  --  200* 341* 295* 144*  BUN 12  --  12 12 12 14   CREATININE 0.96 1.01* 1.07* 0.97 0.94 0.93  CALCIUM 9.3  --  8.7* 8.8* 9.1 9.7  MG  --   --   --  2.0 2.2 2.2   GFR: Estimated Creatinine Clearance: 88.3 mL/min (by C-G formula based on SCr of 0.93 mg/dL). Recent Labs  Lab 04/11/23 1258 04/12/23 0238 04/13/23 0230 04/14/23 0246 04/15/23 0300  PROCALCITON <0.10 <0.10  --   --   --   WBC 10.6* 9.4 12.0* 15.0* 15.0*    Liver Function Tests: No results for input(s): "AST", "ALT", "ALKPHOS", "BILITOT", "PROT", "ALBUMIN" in the last 168 hours. No results for input(s): "LIPASE", "AMYLASE" in the last 168 hours. No results for input(s): "AMMONIA" in the last 168 hours.  ABG    Component Value Date/Time   PHART 7.32 (L) 04/15/2023 1330   PCO2ART 90 (HH) 04/15/2023 1330   PO2ART 64 (L) 04/15/2023 1330   HCO3 46.4 (H) 04/15/2023 1330   TCO2 26 11/10/2020 2017   O2SAT 92.7 04/15/2023 1330     Coagulation Profile: No results for input(s): "INR", "PROTIME" in the last 168 hours.  Cardiac Enzymes: No results for input(s): "CKTOTAL", "CKMB", "CKMBINDEX", "TROPONINI" in the last 168 hours.  HbA1C: Hgb A1c MFr Bld  Date/Time  Value Ref Range Status  04/11/2023 12:58 PM 8.2 (H) 4.8 - 5.6 % Final    Comment:    (NOTE)         Prediabetes: 5.7 - 6.4         Diabetes: >6.4         Glycemic control for adults with diabetes: <7.0   11/22/2021 06:42 PM 6.5 (H) 4.8 - 5.6 % Final    Comment:    (NOTE) Pre diabetes:          5.7%-6.4%  Diabetes:              >6.4%  Glycemic control for   <7.0% adults with diabetes     CBG: Recent Labs  Lab 04/14/23 1124 04/14/23 1716 04/14/23 2152 04/15/23 0751 04/15/23 1244  GLUCAP 286* 350* 324* 158* 214*    Review of Systems:   Shortness of breath Drowsiness Fatigue  Past Medical History:  She,  has a past medical history of Anxiety, Asthma, COPD (chronic obstructive pulmonary disease) (HCC), Depression, Hypertension, Major depression, and PTSD (post-traumatic stress disorder).   Surgical History:   Past Surgical History:  Procedure Laterality Date   ECTOPIC PREGNANCY SURGERY       Social History:   reports that she has been smoking cigarettes. She has been smoking an average of 1 pack per day. She has never used smokeless tobacco. She reports current alcohol use. She reports current drug use. Drugs: Cocaine and Marijuana.   Family History:  Her family history includes Cerebral aneurysm in her mother; Diabetes in her sister. There is no history of Other.   Allergies Allergies  Allergen Reactions   Nsaids Other (See Comments)     Kidney Disorder     Home Medications  Prior to Admission medications   Medication Sig Start Date End Date Taking? Authorizing Provider  acetaminophen (TYLENOL) 325 MG tablet Take 650 mg by mouth 2 (two) times daily as needed for fever, headache or moderate pain.   Yes [provider]  albuterol (VENTOLIN HFA) 108 (90 Base) MCG/ACT inhaler INHALE 2 PUFFS INTO THE LUNGS EVERY FOUR HOURS AS NEEDED FOR WHEEZING OR SHORTNESS OF BREATH. Patient taking differently: Inhale 2 puffs into the lungs every 4 (four) hours as  needed for wheezing or shortness of breath. 11/30/20 06/15/23 Yes Sheikh,  Omair Latif, DO  amoxicillin-clavulanate (AUGMENTIN) 875-125 MG tablet Take 1 tablet by mouth every 12 (twelve) hours. 04/11/23  Yes Darrick Grinder, PA-C  atorvastatin (LIPITOR) 40 MG tablet TAKE 1 TABLET (40 MG TOTAL) BY MOUTH DAILY. Patient taking differently: Take 40 mg by mouth daily. 11/30/20 06/15/23 Yes Sheikh, Omair Latif, DO  azithromycin (ZITHROMAX) 250 MG tablet Take 1 tablet (250 mg total) by mouth daily. Take first 2 tablets together, then 1 every day until finished. 04/11/23  Yes Darrick Grinder, PA-C  budesonide-formoterol (SYMBICORT) 160-4.5 MCG/ACT inhaler Inhale 2 puffs into the lungs 2 (two) times daily.   Yes [provider]  clopidogrel (PLAVIX) 75 MG tablet Take 75 mg by mouth every evening.   Yes [provider]  diclofenac Sodium (VOLTAREN) 1 % GEL Apply 1 Application topically 4 (four) times daily as needed (pain).   Yes [provider]  dicyclomine (BENTYL) 20 MG tablet Take 1 tablet (20 mg total) by mouth 2 (two) times daily. Patient taking differently: Take 20 mg by mouth 2 (two) times daily as needed for spasms. 01/31/23  Yes Glyn Ade, MD  Ferrous Sulfate (IRON PO) Take 1 tablet by mouth daily.   Yes [provider]  FLUoxetine (PROZAC) 20 MG capsule Take 1 capsule (20 mg total) by mouth daily. Patient taking differently: Take 40 mg by mouth daily. 11/30/21  Yes Bobbye Morton, MD  fluticasone (FLONASE) 50 MCG/ACT nasal spray PLACE 2 SPRAYS INTO BOTH NOSTRILS DAILY. Patient taking differently: Place 2 sprays into both nostrils daily as needed for allergies. 11/30/20 06/15/23 Yes Sheikh, Omair Latif, DO  furosemide (LASIX) 20 MG tablet Take 1 tablet (20 mg total) by mouth daily. 04/11/23  Yes Darrick Grinder, PA-C  furosemide (LASIX) 20 MG tablet Take 20 mg by mouth daily.   Yes [provider]  gabapentin (NEURONTIN) 300 MG capsule Take 900 mg by  mouth in the morning, at noon, and at bedtime.   Yes [provider]  hydrOXYzine (ATARAX) 50 MG tablet Take 50 mg by mouth every 6 (six) hours as needed for anxiety.   Yes [provider]  Insulin Degludec FlexTouch 100 UNIT/ML SOPN Inject 20 Units into the skin daily. 04/11/23  Yes Barrie Dunker B, PA-C  liraglutide (VICTOZA) 18 MG/3ML SOPN Inject 1.8 mg into the skin daily.   Yes [provider]  loratadine (CLARITIN REDITABS) 10 MG dissolvable tablet Take 10 mg by mouth daily.   Yes [provider]  melatonin 3 MG TABS tablet Take 2 tablets (6 mg total) by mouth at bedtime. 11/29/21  Yes Bobbye Morton, MD  methocarbamol (ROBAXIN) 500 MG tablet Take 1 tablet (500 mg total) by mouth every 6 (six) hours as needed for muscle spasms. 11/29/21  Yes Bobbye Morton, MD  mometasone-formoterol (DULERA) 100-5 MCG/ACT AERO INHALE 2 PUFFS INTO THE LUNGS TWO TIMES DAILY. 11/30/20 06/15/23 Yes Sheikh, Omair Latif, DO  montelukast (SINGULAIR) 10 MG tablet TAKE 1 TABLET (10 MG TOTAL) BY MOUTH AT BEDTIME. Patient taking differently: Take 10 mg by mouth at bedtime. 11/30/20 06/15/23 Yes Sheikh, Omair Latif, DO  Multiple Vitamin (MULTIVITAMIN WITH MINERALS) TABS tablet Take 1 tablet by mouth daily. 11/30/20  Yes Sheikh, Omair Latif, DO  nicotine (NICODERM CQ - DOSED IN MG/24 HOURS) 14 mg/24hr patch Place 1 patch (14 mg total) onto the skin daily. 11/30/21  Yes Bobbye Morton, MD  OLANZapine (ZYPREXA) 7.5 MG tablet Take 1 tablet (7.5 mg total) by mouth  at bedtime. 11/29/21  Yes Bobbye Morton, MD  omeprazole (PRILOSEC) 20 MG capsule Take 1 capsule (20 mg total) by mouth daily. 01/31/23  Yes Countryman, Almeta Monas, MD  pantoprazole (PROTONIX) 40 MG tablet TAKE 1 TABLET (40 MG TOTAL) BY MOUTH DAILY. Patient taking differently: Take 40 mg by mouth daily. 11/30/20 06/15/23 Yes Sheikh, Omair Latif, DO  Polyvinyl Alcohol-Povidone (REFRESH OP) Place 1 drop into both eyes 2 (two) times daily as needed  (dryness).   Yes [provider]  tiotropium (SPIRIVA) 18 MCG inhalation capsule Place 18 mcg into inhaler and inhale daily.   Yes [provider]  traZODone (DESYREL) 50 MG tablet Take 1 tablet (50 mg total) by mouth at bedtime. Patient taking differently: Take 50 mg by mouth at bedtime as needed for sleep. 11/29/21  Yes Bobbye Morton, MD  valsartan (DIOVAN) 40 MG tablet Take 40 mg by mouth 2 (two) times daily. 03/10/23  Yes [provider]  gabapentin (NEURONTIN) 400 MG capsule Take 1 capsule (400 mg total) by mouth 3 (three) times daily. Patient not taking: Reported on 04/11/2023 11/29/21   Bobbye Morton, MD  metFORMIN (GLUCOPHAGE) 500 MG tablet Take 1 tablet (500 mg total) by mouth daily with breakfast. Patient not taking: Reported on 04/11/2023 11/30/21   Bobbye Morton, MD     Critical Care time      The patient is critically ill due to acute on chronic hypoxic/hypercapnic respiratory failure and acute on chronic HFrEF.  Critical care was necessary to treat or prevent imminent or life-threatening deterioration.  Critical care was time spent personally by me on the following activities: development of treatment plan with patient and/or surrogate as well as nursing, discussions with consultants, evaluation of patient's response to treatment, examination of patient, obtaining history from patient or surrogate, ordering and performing treatments and interventions, ordering and review of laboratory studies, ordering and review of radiographic studies, pulse oximetry, re-evaluation of patient's condition and participation in multidisciplinary rounds.   During this encounter critical care time was devoted to patient care services described in this note for 44 minutes.     Cheri Fowler, MD [facility] See Amion for pager If no response to pager, please call [phone] until 7pm After 7pm, Please call E-link [phone]

## 2023-04-15 NOTE — Significant Event (Signed)
Rapid Response Event Note   Reason for Call :  Lethargy, hypercapnic (CO2 96)  Initial Focused Assessment:  On arrival, pt laying in bed, eyes open to voice and able to answer orientation questions appropriately. Pt c/o nausea stating she threw up recently. Rhonchi with congested cough present. She is lethargic and goes back to sleep fairly quickly with snoring respirations noted.   VS: HR 107, BP 135/83, RR 18, spO2 95% on 5L [place].     Interventions:  Md notified and to bedside Abg- 7.32/90/64/46.4 KUB PCU level of care Bipap  Sitter ordered CCM consult  Plan of Care:  Continue to monitor pt. Will reevaluate after Bipap use. RN instructed to call with any changes or concerns.    Event Summary:   MD Notified: Nelson Chimes- PTA Call Time: 1255 Arrival Time:1300 End Time:1355  Mordecai Rasmussen, RN

## 2023-04-16 DIAGNOSIS — J9621 Acute and chronic respiratory failure with hypoxia: Secondary | ICD-10-CM

## 2023-04-16 DIAGNOSIS — J441 Chronic obstructive pulmonary disease with (acute) exacerbation: Secondary | ICD-10-CM

## 2023-04-16 DIAGNOSIS — J9622 Acute and chronic respiratory failure with hypercapnia: Secondary | ICD-10-CM

## 2023-04-16 LAB — BASIC METABOLIC PANEL
Anion gap: 12 (ref 5–15)
BUN: 36 mg/dL — ABNORMAL HIGH (ref 6–20)
CO2: 36 mmol/L — ABNORMAL HIGH (ref 22–32)
Calcium: 9.6 mg/dL (ref 8.9–10.3)
Chloride: 88 mmol/L — ABNORMAL LOW (ref 98–111)
Creatinine, Ser: 1.27 mg/dL — ABNORMAL HIGH (ref 0.44–1.00)
GFR, Estimated: 52 mL/min — ABNORMAL LOW (ref >60)
Glucose, Bld: 192 mg/dL — ABNORMAL HIGH (ref 70–99)
Potassium: 4.9 mmol/L (ref 3.5–5.1)
Sodium: 136 mmol/L (ref 135–145)

## 2023-04-16 LAB — CBC
HCT: 43.1 % (ref 36.0–46.0)
Hemoglobin: 13.4 g/dL (ref 12.0–15.0)
MCH: 30 pg (ref 26.0–34.0)
MCHC: 31.1 g/dL (ref 30.0–36.0)
MCV: 96.6 fL (ref 80.0–100.0)
Platelets: 344 10*3/uL (ref 150–400)
RBC: 4.46 MIL/uL (ref 3.87–5.11)
RDW: 14 % (ref 11.5–15.5)
WBC: 10.5 10*3/uL (ref 4.0–10.5)
nRBC: 0 % (ref 0.0–0.2)

## 2023-04-16 LAB — BLOOD GAS, ARTERIAL
Acid-Base Excess: 14.9 mmol/L — ABNORMAL HIGH (ref 0.0–2.0)
Bicarbonate: 43.8 mmol/L — ABNORMAL HIGH (ref 20.0–28.0)
O2 Saturation: 97.5 %
Patient temperature: 36.8
pCO2 arterial: 73 mmHg (ref 32–48)
pH, Arterial: 7.38 (ref 7.35–7.45)
pO2, Arterial: 81 mmHg — ABNORMAL LOW (ref 83–108)

## 2023-04-16 LAB — MAGNESIUM: Magnesium: 2 mg/dL (ref 1.7–2.4)

## 2023-04-16 LAB — BRAIN NATRIURETIC PEPTIDE: B Natriuretic Peptide: 68.9 pg/mL (ref 0.0–100.0)

## 2023-04-16 LAB — GLUCOSE, CAPILLARY
Glucose-Capillary: 167 mg/dL — ABNORMAL HIGH (ref 70–99)
Glucose-Capillary: 162 mg/dL — ABNORMAL HIGH (ref 70–99)
Glucose-Capillary: 144 mg/dL — ABNORMAL HIGH (ref 70–99)
Glucose-Capillary: 295 mg/dL — ABNORMAL HIGH (ref 70–99)
Glucose-Capillary: 198 mg/dL — ABNORMAL HIGH (ref 70–99)

## 2023-04-16 MED ORDER — STERILE WATER FOR INJECTION IJ SOLN
INTRAMUSCULAR | Status: AC
  Filled 2023-04-16: qty 10

## 2023-04-16 NOTE — Progress Notes (Signed)
   04/16/23 0625  BiPAP/CPAP/SIPAP  Respiratory Rate 12 breaths/min (-14)  IPAP 22 cmH20  EPAP (S)  13 cmH2O  FiO2 (%) 40 %  Minute Ventilation 4.5 (-6.0)  Tidal Volume (Vt) 350 (-432)  Press High Alarm 25 cmH2O  BiPAP/CPAP /SiPAP Vitals  Pulse Rate 81  SpO2 99 %

## 2023-04-16 NOTE — Progress Notes (Signed)
PROGRESS NOTE    Cathy Hall  ZOX:[id number] DOB: Dec 22, 1970 DOA: 04/10/2023 PCP: Patient, No Pcp Per   Brief Narrative:  52 year old with history of diastolic CHF, polysubstance abuse, morbid obesity, COPD, CVA, HTN, DM 2 admitted to the hospital for shortness of breath.  Patient was found to have parainfluenza a positive.  Procalcitonin negative therefore stopping antibiotics.  Patient was started on steroids, aggressive bronchodilators.  Hospital course complicated by CO2 narcosis requiring BiPAP.  Patient also seen by pulmonary team.   Assessment & Plan:  Principal Problem:   Parainfluenza pneumonia Active Problems:   Acute on chronic diastolic CHF (congestive heart failure) (HCC)   Essential hypertension   Obesity, Class III, BMI 40-49.9 (morbid obesity) (HCC)   PVD (peripheral vascular disease) (HCC)   Chronic obstructive pulmonary disease (HCC)   Dyslipidemia (high LDL; low HDL)   Polysubstance abuse (HCC)   Left upper lobe pneumonia   COPD exacerbation (HCC)    Acute mild encephalopathy secondary to CO2 narcosis Drowsy - Due to her morbid obesity, some signs of volume overload and underlying respiratory issues caused and exacerbated by parainfluenza patient had significant retention of pCO2.  Currently would continue BiPAP especially whenever asleep.  Advised to sit up as much as possible.  Hold off on diuretics today due to bump in creatinine.   Acute respiratory distress Parainfluenza pneumonia, acute bronchitis Acute  COPD/asthma Respiratory sounds is improved a little bit but main concern remains her morbid obesity. Continue Solu-Medrol, bronchodilators.  I-S/flutter valve. Procalcitonin and BNP unremarkable  Patient would well benefit from home NIPPV.  TOC consulted to look into this  Hyperkalemia - Improved.  Likely from steroids.  EKG is stable.  S/p Lokelma  Mild acute kidney injury - Baseline creatinine 0.9.  This morning 1.27.   Acute on  chronic diastolic CHF, resolved - Appears somewhat euvolemic.     Cerebrovascular disease Hypertension Hyperlipidemia - Continue atorvasatatin, Plavix.  Continue home ARB     Diabetes mellitus type II, insulin-dependent, uncontrolled hyperglycemia secondary to steroids Peripheral neuropathy Holding metformin.  Cont Semglee to 42 units daily.   Premeal insulin.  Sliding scale and Accu-Cheks.  Will continue to adjust as needed Continue gabapentin  History of depression/anxiety - Continue home Prozac  Will benefit outpatient sleep study but for now will need home NiPPV, will consult TOC to assist.    DVT prophylaxis: Lovenox Code Status: Full code Family Communication: Boyfriend at bedside Still has significant abnormal breath sounds.  Continue hospital stay Given patient's comorbidities including morbid obesity, very slow to improve     Diet Orders (From admission, onward)     Start     Ordered   04/11/23 0743  Diet heart healthy/carb modified Room service appropriate? Yes; Fluid consistency: Thin  Diet effective now       Question Answer Comment  Diet-HS Snack? Nothing   Room service appropriate? Yes   Fluid consistency: Thin      04/11/23 0742            Subjective: Laying in bed, Answers appropriately.   Examination: Constitutional: Not in acute distress on Bipap. Morbidly obese Respiratory: Clear to auscultation bilaterally Cardiovascular: Normal sinus rhythm, no rubs Abdomen: Nontender nondistended good bowel sounds Musculoskeletal: No edema noted Skin: No rashes seen Neurologic: CN 2-12 grossly intact.  And nonfocal Psychiatric: Normal judgment and insight. Alert and oriented x 3. Normal mood.    Objective: Vitals:   04/16/23 0225 04/16/23 0500 04/16/23 0625 04/16/23 0752  BP:  Marland Kitchen)  130/92  116/81  Pulse:  84 (P) 81 92  Resp: 17 15  16   Temp:  97.6 F (36.4 C)  98.1 F (36.7 C)  TempSrc:  Axillary  Axillary  SpO2:  100% (P) 99% 99%  Weight:         Intake/Output Summary (Last 24 hours) at 04/16/2023 0839 Last data filed at 04/16/2023 0558 Gross per 24 hour  Intake 3 ml  Output 1600 ml  Net -1597 ml    Filed Weights   04/10/23 2102 04/12/23 0359  Weight: 122.5 kg 121.6 kg    Scheduled Meds:  acetaZOLAMIDE  500 mg Intravenous Q12H   atorvastatin  40 mg Oral Daily   budesonide (PULMICORT) nebulizer solution  0.25 mg Nebulization BID   clopidogrel  75 mg Oral Daily   enoxaparin (LOVENOX) injection  40 mg Subcutaneous Q24H   feeding supplement  237 mL Oral BID BM   FLUoxetine  40 mg Oral Daily   guaiFENesin  600 mg Oral BID   insulin aspart  0-20 Units Subcutaneous TID WC   insulin aspart  0-5 Units Subcutaneous QHS   insulin aspart  12 Units Subcutaneous TID WC   insulin glargine-yfgn  45 Units Subcutaneous Q2200   ipratropium-albuterol  3 mL Nebulization BID   irbesartan  75 mg Oral Daily   living well with diabetes book   Does not apply Once   methylPREDNISolone (SOLU-MEDROL) injection  40 mg Intravenous Daily   mometasone-formoterol  2 puff Inhalation BID   montelukast  10 mg Oral QHS   pantoprazole  40 mg Oral Daily   sodium chloride flush  3 mL Intravenous Q12H   sterile water (preservative free)       Continuous Infusions:  Nutritional status     Body mass index is 50.64 kg/m.  Data Reviewed:   CBC: Recent Labs  Lab 04/12/23 0238 04/13/23 0230 04/14/23 0246 04/15/23 0300 04/16/23 0214  WBC 9.4 12.0* 15.0* 15.0* 10.5  HGB 12.8 12.6 12.5 13.3 13.4  HCT 40.7 40.4 40.6 44.1 43.1  MCV 95.5 95.5 94.4 98.4 96.6  PLT 281 305 330 338 344   Basic Metabolic Panel: Recent Labs  Lab 04/12/23 0238 04/13/23 0230 04/14/23 0246 04/15/23 0300 04/16/23 0214  NA 137 134* 135 138 136  K 4.0 5.5* 5.0 5.1 4.9  CL 99 95* 94* 92* 88*  CO2 27 29 32 40* 36*  GLUCOSE 200* 341* 295* 144* 192*  BUN 12 12 12 14  36*  CREATININE 1.07* 0.97 0.94 0.93 1.27*  CALCIUM 8.7* 8.8* 9.1 9.7 9.6  MG  --  2.0 2.2 2.2 2.0    GFR: Estimated Creatinine Clearance: 64.7 mL/min (A) (by C-G formula based on SCr of 1.27 mg/dL (H)). Liver Function Tests: No results for input(s): "AST", "ALT", "ALKPHOS", "BILITOT", "PROT", "ALBUMIN" in the last 168 hours. No results for input(s): "LIPASE", "AMYLASE" in the last 168 hours. No results for input(s): "AMMONIA" in the last 168 hours. Coagulation Profile: No results for input(s): "INR", "PROTIME" in the last 168 hours. Cardiac Enzymes: No results for input(s): "CKTOTAL", "CKMB", "CKMBINDEX", "TROPONINI" in the last 168 hours. BNP (last 3 results) No results for input(s): "PROBNP" in the last 8760 hours. HbA1C: No results for input(s): "HGBA1C" in the last 72 hours.  CBG: Recent Labs  Lab 04/15/23 1555 04/15/23 2139 04/15/23 2329 04/16/23 0525 04/16/23 0750  GLUCAP 275* 200* 225* 167* 162*   Lipid Profile: No results for input(s): "CHOL", "HDL", "LDLCALC", "TRIG", "CHOLHDL", "LDLDIRECT" in  the last 72 hours. Thyroid Function Tests: No results for input(s): "TSH", "T4TOTAL", "FREET4", "T3FREE", "THYROIDAB" in the last 72 hours. Anemia Panel: No results for input(s): "VITAMINB12", "FOLATE", "FERRITIN", "TIBC", "IRON", "RETICCTPCT" in the last 72 hours. Sepsis Labs: Recent Labs  Lab 04/11/23 1258 04/12/23 0238  PROCALCITON <0.10 <0.10    Recent Results (from the past 240 hour(s))  Resp panel by RT-PCR (RSV, Flu A&B, Covid) Anterior Nasal Swab     Status: None   Collection Time: 04/10/23  9:06 PM   Specimen: Anterior Nasal Swab  Result Value Ref Range Status   SARS Coronavirus 2 by RT PCR NEGATIVE NEGATIVE Final   Influenza A by PCR NEGATIVE NEGATIVE Final   Influenza B by PCR NEGATIVE NEGATIVE Final    Comment: (NOTE) The Xpert Xpress SARS-CoV-2/FLU/RSV plus assay is intended as an aid in the diagnosis of influenza from Nasopharyngeal swab specimens and should not be used as a sole basis for treatment. Nasal washings and aspirates are unacceptable for  Xpert Xpress SARS-CoV-2/FLU/RSV testing.  Fact Sheet for Patients: BloggerCourse.com  Fact Sheet for Healthcare Providers: SeriousBroker.it  This test is not yet approved or cleared by the [place] FDA and has been authorized for detection and/or diagnosis of SARS-CoV-2 by FDA under an Emergency Use Authorization (EUA). This EUA will remain in effect (meaning this test can be used) for the duration of the COVID-19 declaration under Section 564(b)(1) of the Act, 21 U.S.C. section 360bbb-3(b)(1), unless the authorization is terminated or revoked.     Resp Syncytial Virus by PCR NEGATIVE NEGATIVE Final    Comment: (NOTE) Fact Sheet for Patients: BloggerCourse.com  Fact Sheet for Healthcare Providers: SeriousBroker.it  This test is not yet approved or cleared by the [place] FDA and has been authorized for detection and/or diagnosis of SARS-CoV-2 by FDA under an Emergency Use Authorization (EUA). This EUA will remain in effect (meaning this test can be used) for the duration of the COVID-19 declaration under Section 564(b)(1) of the Act, 21 U.S.C. section 360bbb-3(b)(1), unless the authorization is terminated or revoked.  Performed at [facility], [address] [place], [place] [address]   Respiratory (~20 pathogens) panel by PCR     Status: Abnormal   Collection Time: 04/10/23  9:06 PM   Specimen: Nasopharyngeal Swab; Respiratory  Result Value Ref Range Status   Adenovirus NOT DETECTED NOT DETECTED Final   Coronavirus 229E NOT DETECTED NOT DETECTED Final    Comment: (NOTE) The Coronavirus on the Respiratory Panel, DOES NOT test for the novel  Coronavirus (2019 nCoV)    Coronavirus HKU1 NOT DETECTED NOT DETECTED Final   Coronavirus NL63 NOT DETECTED NOT DETECTED Final   Coronavirus OC43 NOT DETECTED NOT DETECTED Final   Metapneumovirus NOT DETECTED NOT  DETECTED Final   Rhinovirus / Enterovirus NOT DETECTED NOT DETECTED Final   Influenza A NOT DETECTED NOT DETECTED Final   Influenza B NOT DETECTED NOT DETECTED Final   Parainfluenza Virus 1 NOT DETECTED NOT DETECTED Final   Parainfluenza Virus 2 NOT DETECTED NOT DETECTED Final   Parainfluenza Virus 3 DETECTED (A) NOT DETECTED Final   Parainfluenza Virus 4 NOT DETECTED NOT DETECTED Final   Respiratory Syncytial Virus NOT DETECTED NOT DETECTED Final   Bordetella pertussis NOT DETECTED NOT DETECTED Final   Bordetella Parapertussis NOT DETECTED NOT DETECTED Final   Chlamydophila pneumoniae NOT DETECTED NOT DETECTED Final   Mycoplasma pneumoniae NOT DETECTED NOT DETECTED Final    Comment: Performed at [facility], [address], [place], [place] [address]         Radiology Studies: ECHOCARDIOGRAM COMPLETE  Result Date: 04/15/2023    ECHOCARDIOGRAM REPORT   Patient Name:   ZAYNAB CHIPMAN [facility] Date of Exam: 04/15/2023 Medical Rec #:  [id number]                   Height:       61.0 in Accession #:    [id number]                  Weight:       268.0 lb Date of Birth:  1971-04-16                   BSA:          2.139 m Patient Age:    51 years                    BP:           107/72 mmHg Patient Gender: F                           HR:           105 bpm. Exam Location:  Inpatient Procedure: 2D Echo, Cardiac Doppler and Color Doppler Indications:    Dyspnea R06.00  History:        Patient has prior history of Echocardiogram examinations, most                 recent 11/06/2020. COPD, Signs/Symptoms:Dyspnea; Risk                 Factors:Hypertension.  Sonographer:    Eulah Pont RDCS Referring Phys: [id number] QIHKVQ CHAND  Sonographer Comments: Patient is obese and suboptimal subcostal window. Image acquisition challenging due to uncooperative patient. pt sitting up and constantly moving throughout exam. IMPRESSIONS  1. Left ventricular ejection fraction, by estimation, is 60 to 65%. The left  ventricle has normal function. The left ventricle has no regional wall motion abnormalities. There is mild concentric left ventricular hypertrophy. Left ventricular diastolic parameters are consistent with Grade I diastolic dysfunction (impaired relaxation).  2. Right ventricular systolic function is normal. The right ventricular size is normal. Tricuspid regurgitation signal is inadequate for assessing PA pressure.  3. The mitral valve is grossly normal. Trivial mitral valve regurgitation.  4. The aortic valve is tricuspid. Aortic valve regurgitation is not visualized. No aortic stenosis is present. Comparison(s): No significant change from prior study. FINDINGS  Left Ventricle: Left ventricular ejection fraction, by estimation, is 60 to 65%. The left ventricle has normal function. The left ventricle has no regional wall motion abnormalities. The left ventricular internal cavity size was normal in size. There is  mild concentric left ventricular hypertrophy. Left ventricular diastolic parameters are consistent with Grade I diastolic dysfunction (impaired relaxation). Right Ventricle: The right ventricular size is normal. No increase in right ventricular wall thickness. Right ventricular systolic function is normal. Tricuspid regurgitation signal is inadequate for assessing PA pressure. Left Atrium: Left atrial size was normal in size. Right Atrium: Right atrial size was normal in size. Pericardium: There is no evidence of pericardial effusion. Mitral Valve: The mitral valve is grossly normal. Trivial mitral valve regurgitation. Tricuspid Valve: The tricuspid valve is normal in structure. Tricuspid valve regurgitation is trivial. Aortic Valve: The aortic valve is tricuspid. Aortic valve regurgitation is not visualized. No aortic  stenosis is present. Pulmonic Valve: The pulmonic valve was normal in structure. Pulmonic valve regurgitation is trivial. Aorta: The aortic root and ascending aorta are structurally normal,  with no evidence of dilitation. IAS/Shunts: The atrial septum is grossly normal.  LEFT VENTRICLE PLAX 2D LVIDd:         3.70 cm     Diastology LVIDs:         2.40 cm     LV e' medial:    3.12 cm/s LV PW:         1.10 cm     LV E/e' medial:  22.5 LV IVS:        1.10 cm     LV e' lateral:   4.43 cm/s LVOT diam:     1.90 cm     LV E/e' lateral: 15.8 LV SV:         52 LV SV Index:   24 LVOT Area:     2.84 cm  LV Volumes (MOD) LV vol d, MOD A2C: 55.3 ml LV vol d, MOD A4C: 72.4 ml LV vol s, MOD A2C: 20.8 ml LV vol s, MOD A4C: 30.7 ml LV SV MOD A2C:     34.5 ml LV SV MOD A4C:     72.4 ml LV SV MOD BP:      37.1 ml RIGHT VENTRICLE RV S prime:     23.00 cm/s TAPSE (M-mode): 2.0 cm LEFT ATRIUM             Index        RIGHT ATRIUM           Index LA diam:        4.10 cm 1.92 cm/m   RA Area:     12.00 cm LA Vol (A2C):   29.3 ml 13.70 ml/m  RA Volume:   26.70 ml  12.48 ml/m LA Vol (A4C):   31.3 ml 14.63 ml/m LA Biplane Vol: 31.9 ml 14.91 ml/m  AORTIC VALVE LVOT Vmax:   127.00 cm/s LVOT Vmean:  80.300 cm/s LVOT VTI:    0.183 m  AORTA Ao Root diam: 3.10 cm Ao Asc diam:  3.20 cm MITRAL VALVE MV Area (PHT): 3.99 cm    SHUNTS MV Decel Time: 190 msec    Systemic VTI:  0.18 m MV E velocity: 70.20 cm/s  Systemic Diam: 1.90 cm MV A velocity: 73.40 cm/s MV E/A ratio:  0.96 Laurance Flatten MD Electronically signed by Laurance Flatten MD Signature Date/Time: 04/15/2023/4:38:25 PM    Final    DG Abd 1 View  Result Date: 04/15/2023 CLINICAL DATA:  Abdominal pain and distention. EXAM: ABDOMEN - 1 VIEW COMPARISON:  CT abdomen pelvis dated January 31, 2023. FINDINGS: The bowel gas pattern is normal. No radio-opaque calculi or other significant radiographic abnormality are seen. IUD noted. IMPRESSION: 1. Negative. Electronically Signed   By: Obie Dredge M.D.   On: 04/15/2023 13:59           LOS: 2 days   Time spent= 35 mins    Aneli Zara Joline Maxcy, MD [facility]  If 7PM-7AM, please contact  night-coverage  04/16/2023, 8:39 AM

## 2023-04-16 NOTE — Progress Notes (Signed)
OT Cancellation Note  Patient Details Name: Shariece Viveiros MRN: [id number] DOB: 24-Jan-1971   Cancelled Treatment:    Reason Eval/Treat Not Completed: Medical issues which prohibited therapy.  Remains on BiPap  Matea Stanard D Curtina Grills 04/16/2023, 4:00 PM 04/16/2023  RP, OTR/L  Acute Rehabilitation Services  Office:  [phone]

## 2023-04-16 NOTE — Progress Notes (Signed)
   04/15/23 2142  BiPAP/CPAP/SIPAP  BiPAP/CPAP/SIPAP V60  Mask Type Full face mask  Mask Size Medium  Set Rate 12 breaths/min  Respiratory Rate 16 breaths/min (-18)  IPAP 22 cmH20  EPAP 15 cmH2O  FiO2 (%) 40 %  Minute Ventilation 5.9  Peak Inspiratory Pressure (PIP) 22  Tidal Volume (Vt) 364 (-418)  Press High Alarm 25 cmH2O  BiPAP/CPAP /SiPAP Vitals  Pulse Rate 96  Resp 16  SpO2 99 %  MEWS Score/Color  MEWS Score 1  MEWS Score Color Cathy Hall

## 2023-04-16 NOTE — Progress Notes (Signed)
NAME:  Cathy Hall, MRN:  [id number], DOB:  01/18/1971, LOS: 2 ADMISSION DATE:  04/10/2023, CONSULTATION DATE:  04/15/2023 REFERRING MD:  Nelson Chimes , CHIEF COMPLAINT:  Dyspnea    History of Present Illness:   Cathy Hall is a 52 y.o. female current every day smoker (Smokes 1 PPD x unknown  years) with medical history significant of morbid obesity with a BMI of 51, COPD/asthma, diabetes, diastolic dysfunction, hypertension, remote CVA and peripheral neuropathy.  Patient presented to the ER with cough congestion and elevated blood glucose.  She was hospitalized with exacerbation of HFpEF April 2024 in [place].  Patient was discharged on appropriate medications but has not taken Lasix for 5 days and most of her other medications due to lack of insurance or funds to pay for medicines out-of-pocket.  She states she is now a resident of [address] and living in [place].  Due to the death of a recent family member she has been traveling back and forth between [place] and [place] to handle the estate.  She reports she has been having a strong productive cough with status of nasal secretions and congestion for about 1-1/2 months.  She was instructed to follow-up as an outpatient to obtain a polysomnogram due to significant concerns over having underlying sleep apnea.  She notes that she is aware she stops breathing while sleeping and had much better respiratory symptoms after CPAP was utilized during previous hospitalization.  She reports she is trying to get disability.   Pt. Presented to the ED 04/10/2023 . She was febrile at the time, slightly tachypneic and tachycardic with elevated blood pressure 179/100. Sats on  room  were 96% but dropped down to 87% when supine or while asleep. Labs were unremarkable except modestly elevated troponin of 24 and 22. Viral PCR for flu, RSV and COVID were negative. Para-influenza Virus 3 was positive. hCG was less than 5. Chest  x-ray revealed bibasilar patchy opacities which could be either atelectasis or infection. Given her history of recurrent car travel in context of morbid obesity CTA of the chest to rule out PE was completed. No evidence of PE but there were patchy bilateral groundglass opacities greatest in the left upper lobe favoring atypical infection versus edema.   PCCM were asked to see as a pulmonary / ICU consult for Acute on Chronic Hypercapnic respiratory failure.   ABG's concerning for worsening hypercapnic failure>> 7/32/ 90/64/46.4/92.7% sat, After this ABG patient was placed on BiPAP.   Pertinent  Medical History    Past Medical History:  Diagnosis Date   Anxiety    Asthma    COPD (chronic obstructive pulmonary disease) (HCC)    Depression    Hypertension    Major depression    PTSD (post-traumatic stress disorder)      Significant Hospital Events: Including procedures, antibiotic start and stop dates in addition to other pertinent events   04/10/2023 Admission to [facility]  04/15/2023 BiPAP initiation  echocardiogram LVEF 60 to 65% With no wall motion abnormality grade 1 diastolic dysfunction RV function normal 6/11 arterial blood gas reviewed and improved from day prior.  Still hypercarbic.  Fluid balance -8 L.  Interim History / Subjective:  No distress feels better  Objective   Blood pressure 116/81, pulse 92, temperature 98.1 F (36.7 C), temperature source Axillary, resp. rate 16, weight 121.6 kg, SpO2 99 %.    FiO2 (%):  [40 %-50 %] (P) 40 %   Intake/Output Summary (Last 24  hours) at 04/16/2023 0811 Last data filed at 04/16/2023 0558 Gross per 24 hour  Intake 3 ml  Output 1600 ml  Net -1597 ml   Filed Weights   04/10/23 2102 04/12/23 0359  Weight: 122.5 kg 121.6 kg    Examination: General: Pleasant 52 year old okay thank you patient resting quietly in bed she is alert no acute distress HEENT normocephalic atraumatic neck is large currently has BiPAP mask in  place Pulmonary: Some scattered rhonchi, rhonchus cough, no accessory use Cardiac: Regular rate and rhythm Abdomen: Soft nontender Extremities: Warm dry trace lower extremity edema Neuro: Awake, cooperative.  Moves all extremities  Resolved Hospital Problem list     Assessment & Plan:  Acute on Chronic hypoxic/hypercarbic Respiratory Failure Parainfluenza pneumonia, acute bronchitis Acute  COPD/asthma Current every day smoker  Acute on Chronic Heart Failure  Hyperkalemia Acute Encephalopathy 2/2 hypercarbia Diabetes mellitus type II, insulin-dependent, uncontrolled hyperglycemia secondary to steroids Peripheral neuropathy  Acute on chronic hypoxic and hypercarbic respiratory failure in the setting of decompensated OHS/OSA complicated further by recent parainfluenza pneumonitis with acute asthmatic exacerbation -Better after NIPPV Plan Continue BiPAP mandatory at at bedtime and as needed during the day and during times of rest Continue diuresis with Diamox , Would  push diuretics as long as BUN/creatinine and blood pressure allow, follow blood chemistries closely to drop in bicarbonate to rapidly Continue Solu-Medrol, changed to prednisone on 6/12 Continue scheduled bronchodilators and and inhaled corticosteroids Repeat arterial blood gas in a.m. We are going to need to get her long-term therapy if we hope to keep her out of the hospital  Best Practice (right click and "Reselect all SmartList Selections" daily)   Diet/type: NPO DVT prophylaxis: LMWH GI prophylaxis: PPI Lines: N/A Foley:  N/A Code Status:  full code Last date of multidisciplinary goals of care discussion [Ongoing]    Critical Care time NA       Simonne Martinet ACNP-BC [facility] Pulmonary/Critical Care Pager # [phone] OR # [phone] if no answer

## 2023-04-16 NOTE — TOC Progression Note (Addendum)
Transition of Care [facility]) - Progression Note    Patient Details  Name: Cathy Hall MRN: [id number] Date of Birth: [birth year]  Transition of Care [facility]) CM/SW Contact  Graves-Bigelow, Lamar Laundry, RN Phone Number: 04/16/2023, 2:51 PM  Clinical Narrative: Case Manager received a consult for Trilogy/BIPAP for home. Patient does not have insurance. Case Manager called [employer] to see if they can assist with Trilogy/BiPAP for home. Awaiting call back from the office to see if the patient is a candidate.   1528 04-16-23 [employer] is looking into a loaner  BIPAP for the patient. MD is aware that it may take a couple of days for the office to find one. Out of pocket cost is $3000.00. Case Manager will continue to follow for transition of care needs.   Expected Discharge Plan: Home/Self Care Barriers to Discharge: Continued Medical Work up  Expected Discharge Plan and Services In-house Referral: Clinical Social Work Discharge Planning Services: CM Consult, Follow-up appt scheduled, Indigent Health Clinic, MATCH Program, Medication Assistance Post Acute Care Choice: NA Living arrangements for the past 2 months: Apartment                   DME Agency: NA       HH Arranged: NA  Social Determinants of Health (SDOH) Interventions SDOH Screenings   Food Insecurity: Food Insecurity Present (04/11/2023)  Housing: Medium Risk (04/11/2023)  Transportation Needs: Unmet Transportation Needs (04/11/2023)  Utilities: Not At Risk (04/11/2023)  Alcohol Screen: Low Risk  (11/21/2021)  Depression (PHQ2-9): Medium Risk (08/07/2020)  Tobacco Use: High Risk (04/10/2023)   Readmission Risk Interventions    11/16/2020   11:53 AM  Readmission Risk Prevention Plan  Transportation Screening Complete  PCP or Specialist Appt within 3-5 Days Complete  HRI or Home Care Consult Complete  Social Work Consult for Recovery Care Planning/Counseling Complete  Palliative Care Screening Complete  Medication Review Special educational needs teacher) Complete

## 2023-04-16 NOTE — Progress Notes (Signed)
   04/16/23 2320  BiPAP/CPAP/SIPAP  $ Non-Invasive Ventilator  Non-Invasive Vent Subsequent  BiPAP/CPAP/SIPAP Pt Type Adult  BiPAP/CPAP/SIPAP V60  Reason BIPAP/CPAP not in use Non-compliant    Placed BiPAP on pt and immediately pt was uncomfortable, tachypneic, and crying. Trialed multiple setting to improve comfort to no avail. Pt currently SpO2 100% on RA

## 2023-04-16 NOTE — Progress Notes (Signed)
Patient gave CSW permission to reach out to financial counseling to screen her for medicaid.CSW emailed Christia Reading with financial counseling and requested to screen patient for medicaid. TOC will continue to follow.

## 2023-04-17 LAB — MAGNESIUM: Magnesium: 2.2 mg/dL (ref 1.7–2.4)

## 2023-04-17 LAB — CBC
HCT: 41.7 % (ref 36.0–46.0)
Hemoglobin: 13.3 g/dL (ref 12.0–15.0)
MCH: 29.5 pg (ref 26.0–34.0)
MCHC: 31.9 g/dL (ref 30.0–36.0)
MCV: 92.5 fL (ref 80.0–100.0)
Platelets: 325 10*3/uL (ref 150–400)
RBC: 4.51 MIL/uL (ref 3.87–5.11)
RDW: 13.7 % (ref 11.5–15.5)
WBC: 13.1 10*3/uL — ABNORMAL HIGH (ref 4.0–10.5)
nRBC: 0 % (ref 0.0–0.2)

## 2023-04-17 LAB — GLUCOSE, CAPILLARY
Glucose-Capillary: 120 mg/dL — ABNORMAL HIGH (ref 70–99)
Glucose-Capillary: 161 mg/dL — ABNORMAL HIGH (ref 70–99)
Glucose-Capillary: 223 mg/dL — ABNORMAL HIGH (ref 70–99)
Glucose-Capillary: 144 mg/dL — ABNORMAL HIGH (ref 70–99)

## 2023-04-17 LAB — BASIC METABOLIC PANEL
Anion gap: 12 (ref 5–15)
BUN: 40 mg/dL — ABNORMAL HIGH (ref 6–20)
CO2: 32 mmol/L (ref 22–32)
Calcium: 9.1 mg/dL (ref 8.9–10.3)
Chloride: 91 mmol/L — ABNORMAL LOW (ref 98–111)
Creatinine, Ser: 1.14 mg/dL — ABNORMAL HIGH (ref 0.44–1.00)
GFR, Estimated: 59 mL/min — ABNORMAL LOW (ref >60)
Glucose, Bld: 119 mg/dL — ABNORMAL HIGH (ref 70–99)
Potassium: 3.9 mmol/L (ref 3.5–5.1)
Sodium: 135 mmol/L (ref 135–145)

## 2023-04-17 NOTE — Progress Notes (Signed)
PROGRESS NOTE    Cathy Hall  ZOX:[id number] DOB: Aug 20, 1971 DOA: 04/10/2023 PCP: Patient, No Pcp Per   Brief Narrative:  52 year old with history of diastolic CHF, polysubstance abuse, morbid obesity, COPD, CVA, HTN, DM 2 admitted to the hospital for shortness of breath.  Patient was found to have parainfluenza a positive.  Procalcitonin negative therefore stopping antibiotics.  Patient was started on steroids, aggressive bronchodilators.  Hospital course complicated by CO2 narcosis requiring BiPAP.  Patient also seen by pulmonary team.   Assessment & Plan:  Principal Problem:   Parainfluenza pneumonia Active Problems:   Acute on chronic diastolic CHF (congestive heart failure) (HCC)   Essential hypertension   Obesity, Class III, BMI 40-49.9 (morbid obesity) (HCC)   PVD (peripheral vascular disease) (HCC)   Chronic obstructive pulmonary disease (HCC)   Dyslipidemia (high LDL; low HDL)   Polysubstance abuse (HCC)   Left upper lobe pneumonia   COPD exacerbation (HCC)   Acute on chronic respiratory failure with hypoxia and hypercapnia (HCC)    Acute mild encephalopathy secondary to CO2 narcosis Drowsy - Due to her morbid obesity, some signs of volume overload and underlying respiratory issues caused and exacerbated by parainfluenza patient had significant retention of pCO2.  Currently would continue BiPAP especially whenever asleep.  Advised to sit up as much as possible. TOC working on acquiring NIPPV- likely will be Games developer tomorrow from [employer]  Acute respiratory distress Parainfluenza pneumonia, acute bronchitis Acute  COPD/asthma Respiratory sounds is improved a little bit but main concern remains her morbid obesity. Continue Solu-Medrol, bronchodilators.  I-S/flutter valve. Procalcitonin and BNP unremarkable  Patient would well benefit from home NIPPV.  TOC consulted to look into this  Hyperkalemia - Improved.   Mild acute kidney injury -  Baseline creatinine 0.9.  This morning 1.14.   Acute on chronic diastolic CHF, resolved - Appears somewhat euvolemic.     Cerebrovascular disease Hypertension Hyperlipidemia - Continue atorvasatatin, Plavix.  Continue home ARB     Diabetes mellitus type II, insulin-dependent, uncontrolled hyperglycemia secondary to steroids Peripheral neuropathy Holding metformin.  Cont Semglee to 42 units daily.   Premeal insulin.  Sliding scale and Accu-Cheks.  Will continue to adjust as needed Continue gabapentin  History of depression/anxiety - Continue home Prozac   Morbidly obese-would benefit from outpatient bariatric referral  Will benefit outpatient sleep study but for now will need home NiPPV, TOC working on acquiring this   DVT prophylaxis: Lovenox Code Status: Full code Family Communication: Spoke with SIL over the phone Still has significant abnormal breath sounds.  Continue hospital stay Given patient's comorbidities including morbid obesity, very slow to improve     Diet Orders (From admission, onward)     Start     Ordered   04/11/23 0743  Diet heart healthy/carb modified Room service appropriate? Yes; Fluid consistency: Thin  Diet effective now       Question Answer Comment  Diet-HS Snack? Nothing   Room service appropriate? Yes   Fluid consistency: Thin      04/11/23 0742            Subjective: Little better, off bipap during my visit.   Examination: Constitutional: Not in acute distress. Morbid obesity Respiratory: Clear to auscultation bilaterally Cardiovascular: Normal sinus rhythm, no rubs Abdomen: Nontender nondistended good bowel sounds Musculoskeletal: No edema noted Skin: No rashes seen Neurologic: CN 2-12 grossly intact.  And nonfocal Psychiatric: Normal judgment and insight. Alert and oriented x 3. Normal mood.  Objective: Vitals:   04/17/23 0039 04/17/23 0411 04/17/23 0715 04/17/23 0822  BP: 122/79 (!) 142/100  (!) 130/99  Pulse: 87 80   97  Resp: 19 (!) 22 (!) 22 20  Temp: 98 F (36.7 C) 97.9 F (36.6 C)  98.1 F (36.7 C)  TempSrc: Oral Oral  Oral  SpO2: 97% 100%  96%  Weight:        Intake/Output Summary (Last 24 hours) at 04/17/2023 0846 Last data filed at 04/17/2023 0300 Gross per 24 hour  Intake --  Output 700 ml  Net -700 ml    Filed Weights   04/10/23 2102 04/12/23 0359  Weight: 122.5 kg 121.6 kg    Scheduled Meds:  atorvastatin  40 mg Oral Daily   budesonide (PULMICORT) nebulizer solution  0.25 mg Nebulization BID   clopidogrel  75 mg Oral Daily   enoxaparin (LOVENOX) injection  40 mg Subcutaneous Q24H   feeding supplement  237 mL Oral BID BM   FLUoxetine  40 mg Oral Daily   guaiFENesin  600 mg Oral BID   insulin aspart  0-20 Units Subcutaneous TID WC   insulin aspart  0-5 Units Subcutaneous QHS   insulin aspart  12 Units Subcutaneous TID WC   insulin glargine-yfgn  45 Units Subcutaneous Q2200   ipratropium-albuterol  3 mL Nebulization BID   irbesartan  75 mg Oral Daily   living well with diabetes book   Does not apply Once   methylPREDNISolone (SOLU-MEDROL) injection  40 mg Intravenous Daily   mometasone-formoterol  2 puff Inhalation BID   montelukast  10 mg Oral QHS   pantoprazole  40 mg Oral Daily   sodium chloride flush  3 mL Intravenous Q12H   Continuous Infusions:  Nutritional status     Body mass index is 50.64 kg/m.  Data Reviewed:   CBC: Recent Labs  Lab 04/13/23 0230 04/14/23 0246 04/15/23 0300 04/16/23 0214 04/17/23 0122  WBC 12.0* 15.0* 15.0* 10.5 13.1*  HGB 12.6 12.5 13.3 13.4 13.3  HCT 40.4 40.6 44.1 43.1 41.7  MCV 95.5 94.4 98.4 96.6 92.5  PLT 305 330 338 344 325   Basic Metabolic Panel: Recent Labs  Lab 04/13/23 0230 04/14/23 0246 04/15/23 0300 04/16/23 0214 04/17/23 0122  NA 134* 135 138 136 135  K 5.5* 5.0 5.1 4.9 3.9  CL 95* 94* 92* 88* 91*  CO2 29 32 40* 36* 32  GLUCOSE 341* 295* 144* 192* 119*  BUN 12 12 14  36* 40*  CREATININE 0.97 0.94  0.93 1.27* 1.14*  CALCIUM 8.8* 9.1 9.7 9.6 9.1  MG 2.0 2.2 2.2 2.0 2.2   GFR: Estimated Creatinine Clearance: 72 mL/min (A) (by C-G formula based on SCr of 1.14 mg/dL (H)). Liver Function Tests: No results for input(s): "AST", "ALT", "ALKPHOS", "BILITOT", "PROT", "ALBUMIN" in the last 168 hours. No results for input(s): "LIPASE", "AMYLASE" in the last 168 hours. No results for input(s): "AMMONIA" in the last 168 hours. Coagulation Profile: No results for input(s): "INR", "PROTIME" in the last 168 hours. Cardiac Enzymes: No results for input(s): "CKTOTAL", "CKMB", "CKMBINDEX", "TROPONINI" in the last 168 hours. BNP (last 3 results) No results for input(s): "PROBNP" in the last 8760 hours. HbA1C: No results for input(s): "HGBA1C" in the last 72 hours.  CBG: Recent Labs  Lab 04/16/23 0750 04/16/23 1138 04/16/23 1610 04/16/23 2127 04/17/23 0821  GLUCAP 162* 144* 295* 198* 120*   Lipid Profile: No results for input(s): "CHOL", "HDL", "LDLCALC", "TRIG", "CHOLHDL", "LDLDIRECT" in the  last 72 hours. Thyroid Function Tests: No results for input(s): "TSH", "T4TOTAL", "FREET4", "T3FREE", "THYROIDAB" in the last 72 hours. Anemia Panel: No results for input(s): "VITAMINB12", "FOLATE", "FERRITIN", "TIBC", "IRON", "RETICCTPCT" in the last 72 hours. Sepsis Labs: Recent Labs  Lab 04/11/23 1258 04/12/23 0238  PROCALCITON <0.10 <0.10    Recent Results (from the past 240 hour(s))  Resp panel by RT-PCR (RSV, Flu A&B, Covid) Anterior Nasal Swab     Status: None   Collection Time: 04/10/23  9:06 PM   Specimen: Anterior Nasal Swab  Result Value Ref Range Status   SARS Coronavirus 2 by RT PCR NEGATIVE NEGATIVE Final   Influenza A by PCR NEGATIVE NEGATIVE Final   Influenza B by PCR NEGATIVE NEGATIVE Final    Comment: (NOTE) The Xpert Xpress SARS-CoV-2/FLU/RSV plus assay is intended as an aid in the diagnosis of influenza from Nasopharyngeal swab specimens and should not be used as a sole  basis for treatment. Nasal washings and aspirates are unacceptable for Xpert Xpress SARS-CoV-2/FLU/RSV testing.  Fact Sheet for Patients: BloggerCourse.com  Fact Sheet for Healthcare Providers: SeriousBroker.it  This test is not yet approved or cleared by the [place] FDA and has been authorized for detection and/or diagnosis of SARS-CoV-2 by FDA under an Emergency Use Authorization (EUA). This EUA will remain in effect (meaning this test can be used) for the duration of the COVID-19 declaration under Section 564(b)(1) of the Act, 21 U.S.C. section 360bbb-3(b)(1), unless the authorization is terminated or revoked.     Resp Syncytial Virus by PCR NEGATIVE NEGATIVE Final    Comment: (NOTE) Fact Sheet for Patients: BloggerCourse.com  Fact Sheet for Healthcare Providers: SeriousBroker.it  This test is not yet approved or cleared by the [place] FDA and has been authorized for detection and/or diagnosis of SARS-CoV-2 by FDA under an Emergency Use Authorization (EUA). This EUA will remain in effect (meaning this test can be used) for the duration of the COVID-19 declaration under Section 564(b)(1) of the Act, 21 U.S.C. section 360bbb-3(b)(1), unless the authorization is terminated or revoked.  Performed at [facility], [address], [place], [place] [address]   Respiratory (~20 pathogens) panel by PCR     Status: Abnormal   Collection Time: 04/10/23  9:06 PM   Specimen: Nasopharyngeal Swab; Respiratory  Result Value Ref Range Status   Adenovirus NOT DETECTED NOT DETECTED Final   Coronavirus 229E NOT DETECTED NOT DETECTED Final    Comment: (NOTE) The Coronavirus on the Respiratory Panel, DOES NOT test for the novel  Coronavirus (2019 nCoV)    Coronavirus HKU1 NOT DETECTED NOT DETECTED Final   Coronavirus NL63 NOT DETECTED NOT DETECTED Final   Coronavirus OC43  NOT DETECTED NOT DETECTED Final   Metapneumovirus NOT DETECTED NOT DETECTED Final   Rhinovirus / Enterovirus NOT DETECTED NOT DETECTED Final   Influenza A NOT DETECTED NOT DETECTED Final   Influenza B NOT DETECTED NOT DETECTED Final   Parainfluenza Virus 1 NOT DETECTED NOT DETECTED Final   Parainfluenza Virus 2 NOT DETECTED NOT DETECTED Final   Parainfluenza Virus 3 DETECTED (A) NOT DETECTED Final   Parainfluenza Virus 4 NOT DETECTED NOT DETECTED Final   Respiratory Syncytial Virus NOT DETECTED NOT DETECTED Final   Bordetella pertussis NOT DETECTED NOT DETECTED Final   Bordetella Parapertussis NOT DETECTED NOT DETECTED Final   Chlamydophila pneumoniae NOT DETECTED NOT DETECTED Final   Mycoplasma pneumoniae NOT DETECTED NOT DETECTED Final    Comment: Performed at [facility], [address], [place], [place] [address]         Radiology Studies: ECHOCARDIOGRAM COMPLETE  Result Date: 04/15/2023    ECHOCARDIOGRAM REPORT   Patient Name:   Cathy Hall [facility] Date of Exam: 04/15/2023 Medical Rec #:  [id number]                   Height:       61.0 in Accession #:    [id number]                  Weight:       268.0 lb Date of Birth:  [birth year]                   BSA:          2.139 m Patient Age:    51 years                    BP:           107/72 mmHg Patient Gender: F                           HR:           105 bpm. Exam Location:  Inpatient Procedure: 2D Echo, Cardiac Doppler and Color Doppler Indications:    Dyspnea R06.00  History:        Patient has prior history of Echocardiogram examinations, most                 recent 11/06/2020. COPD, Signs/Symptoms:Dyspnea; Risk                 Factors:Hypertension.  Sonographer:    Eulah Pont RDCS Referring Phys: [id number] DGLOVF CHAND  Sonographer Comments: Patient is obese and suboptimal subcostal window. Image acquisition challenging due to uncooperative patient. pt sitting up and constantly moving throughout exam. IMPRESSIONS  1. Left  ventricular ejection fraction, by estimation, is 60 to 65%. The left ventricle has normal function. The left ventricle has no regional wall motion abnormalities. There is mild concentric left ventricular hypertrophy. Left ventricular diastolic parameters are consistent with Grade I diastolic dysfunction (impaired relaxation).  2. Right ventricular systolic function is normal. The right ventricular size is normal. Tricuspid regurgitation signal is inadequate for assessing PA pressure.  3. The mitral valve is grossly normal. Trivial mitral valve regurgitation.  4. The aortic valve is tricuspid. Aortic valve regurgitation is not visualized. No aortic stenosis is present. Comparison(s): No significant change from prior study. FINDINGS  Left Ventricle: Left ventricular ejection fraction, by estimation, is 60 to 65%. The left ventricle has normal function. The left ventricle has no regional wall motion abnormalities. The left ventricular internal cavity size was normal in size. There is  mild concentric left ventricular hypertrophy. Left ventricular diastolic parameters are consistent with Grade I diastolic dysfunction (impaired relaxation). Right Ventricle: The right ventricular size is normal. No increase in right ventricular wall thickness. Right ventricular systolic function is normal. Tricuspid regurgitation signal is inadequate for assessing PA pressure. Left Atrium: Left atrial size was normal in size. Right Atrium: Right atrial size was normal in size. Pericardium: There is no evidence of pericardial effusion. Mitral Valve: The mitral valve is grossly normal. Trivial mitral valve regurgitation. Tricuspid Valve: The tricuspid valve is normal in structure. Tricuspid valve regurgitation is trivial. Aortic Valve: The aortic valve is tricuspid. Aortic valve regurgitation is not visualized. No aortic stenosis  is present. Pulmonic Valve: The pulmonic valve was normal in structure. Pulmonic valve regurgitation is trivial.  Aorta: The aortic root and ascending aorta are structurally normal, with no evidence of dilitation. IAS/Shunts: The atrial septum is grossly normal.  LEFT VENTRICLE PLAX 2D LVIDd:         3.70 cm     Diastology LVIDs:         2.40 cm     LV e' medial:    3.12 cm/s LV PW:         1.10 cm     LV E/e' medial:  22.5 LV IVS:        1.10 cm     LV e' lateral:   4.43 cm/s LVOT diam:     1.90 cm     LV E/e' lateral: 15.8 LV SV:         52 LV SV Index:   24 LVOT Area:     2.84 cm  LV Volumes (MOD) LV vol d, MOD A2C: 55.3 ml LV vol d, MOD A4C: 72.4 ml LV vol s, MOD A2C: 20.8 ml LV vol s, MOD A4C: 30.7 ml LV SV MOD A2C:     34.5 ml LV SV MOD A4C:     72.4 ml LV SV MOD BP:      37.1 ml RIGHT VENTRICLE RV S prime:     23.00 cm/s TAPSE (M-mode): 2.0 cm LEFT ATRIUM             Index        RIGHT ATRIUM           Index LA diam:        4.10 cm 1.92 cm/m   RA Area:     12.00 cm LA Vol (A2C):   29.3 ml 13.70 ml/m  RA Volume:   26.70 ml  12.48 ml/m LA Vol (A4C):   31.3 ml 14.63 ml/m LA Biplane Vol: 31.9 ml 14.91 ml/m  AORTIC VALVE LVOT Vmax:   127.00 cm/s LVOT Vmean:  80.300 cm/s LVOT VTI:    0.183 m  AORTA Ao Root diam: 3.10 cm Ao Asc diam:  3.20 cm MITRAL VALVE MV Area (PHT): 3.99 cm    SHUNTS MV Decel Time: 190 msec    Systemic VTI:  0.18 m MV E velocity: 70.20 cm/s  Systemic Diam: 1.90 cm MV A velocity: 73.40 cm/s MV E/A ratio:  0.96 Laurance Flatten MD Electronically signed by Laurance Flatten MD Signature Date/Time: 04/15/2023/4:38:25 PM    Final    DG Abd 1 View  Result Date: 04/15/2023 CLINICAL DATA:  Abdominal pain and distention. EXAM: ABDOMEN - 1 VIEW COMPARISON:  CT abdomen pelvis dated January 31, 2023. FINDINGS: The bowel gas pattern is normal. No radio-opaque calculi or other significant radiographic abnormality are seen. IUD noted. IMPRESSION: 1. Negative. Electronically Signed   By: Obie Dredge M.D.   On: 04/15/2023 13:59           LOS: 3 days   Time spent= 35 mins    Josemaria Brining Joline Maxcy,  MD [facility]  If 7PM-7AM, please contact night-coverage  04/17/2023, 8:46 AM
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
## 2023-04-17 NOTE — TOC Progression Note (Signed)
Transition of Care [facility]) - Progression Note    Patient Details  Name: Cathy Hall MRN: [id number] Date of Birth: 1970/12/20  [facility]) CM/SW Contact  Graves-Bigelow, Lamar Laundry, RN Phone Number: 04/17/2023, 2:53 PM  Clinical Narrative: Case Manager received information from [employer] that they have found a loaner BIPAP for the patient @ the [facility]. Orders submitted today for possible delivery on 04-18-23. Case Manager also called [facility] to see if they can follow the patient for charity [facility] PT/OT-awaiting call back.    Expected Discharge Plan: Home w Home Health Services Barriers to Discharge: Continued Medical Work up  Expected Discharge Plan and Services In-house Referral: Clinical Social Work Discharge Planning Services: CM Consult Post Acute Care Choice: NA Living arrangements for the past 2 months: Apartment                 DME Arranged: Bipap DME Agency: [employer] Date DME Agency Contacted: 04/17/23 Time DME Agency Contacted: [phone] Representative spoke with at DME Agency: [facility] HH Arranged: PT, OT HH Agency: [facility] Date HH Agency Contacted: 04/17/23 Time HH Agency Contacted: 1452 Representative spoke with at [facility] Agency: Rivka Barbara   Social Determinants of Health (SDOH) Interventions SDOH Screenings   Food Insecurity: Food Insecurity Present (04/11/2023)  Housing: Medium Risk (04/11/2023)  Transportation Needs: Unmet Transportation Needs (04/11/2023)  Utilities: Not At Risk (04/11/2023)  Alcohol Screen: Low Risk  (11/21/2021)  Depression (PHQ2-9): Medium Risk (08/07/2020)  Tobacco Use: High Risk (04/10/2023)    Readmission Risk Interventions    11/16/2020   11:53 AM  Readmission Risk Prevention Plan  Transportation Screening Complete  PCP or Specialist Appt within 3-5 Days Complete  HRI or Home Care Consult Complete  Social Work Consult for Recovery Care Planning/Counseling Complete  Palliative Care Screening Complete   Medication Review Oceanographer) Complete

## 2023-04-17 NOTE — Plan of Care (Signed)

## 2023-04-17 NOTE — Progress Notes (Signed)
eLink Physician-Brief Progress Note Patient Name: Cathy Hall DOB: 1971-03-11 MRN: [id number]   Date of Service  04/17/2023  HPI/Events of Note  Patient crying and immediately pulling off BIPAP mask when placed on her secondary to claustrophobia, patient is likely at baseline per her most recent ABG, she likely has OSA and would benefit from BIPAP but she is not amenable to wearing it.  eICU Interventions  BIPAP held tonight and RT instructed to monitor patient closely.        Thomasene Lot Neelam Tiggs 04/17/2023, 12:03 AM

## 2023-04-17 NOTE — Progress Notes (Signed)
NAME:  Cathy Hall, MRN:  [id number], DOB:  [birth year], LOS: 3 ADMISSION DATE:  04/10/2023, CONSULTATION DATE:  04/15/2023 REFERRING MD:  Nelson Chimes , CHIEF COMPLAINT:  Dyspnea    History of Present Illness:   Cathy Hall is a 52 y.o. female current every day smoker (Smokes 1 PPD x unknown  years) with medical history significant of morbid obesity with a BMI of 51, COPD/asthma, diabetes, diastolic dysfunction, hypertension, remote CVA and peripheral neuropathy.  Patient presented to the ER with cough congestion and elevated blood glucose.  She was hospitalized with exacerbation of HFpEF April 2024 in [place].  Patient was discharged on appropriate medications but has not taken Lasix for 5 days and most of her other medications due to lack of insurance or funds to pay for medicines out-of-pocket.  She states she is now a resident of [address] and living in [place].  Due to the death of a recent family member she has been traveling back and forth between [place] and [place] to handle the estate.  She reports she has been having a strong productive cough with status of nasal secretions and congestion for about 1-1/2 months.  She was instructed to follow-up as an outpatient to obtain a polysomnogram due to significant concerns over having underlying sleep apnea.  She notes that she is aware she stops breathing while sleeping and had much better respiratory symptoms after CPAP was utilized during previous hospitalization.  She reports she is trying to get disability.   Pt. Presented to the ED 04/10/2023 . She was febrile at the time, slightly tachypneic and tachycardic with elevated blood pressure 179/100. Sats on  room  were 96% but dropped down to 87% when supine or while asleep. Labs were unremarkable except modestly elevated troponin of 24 and 22. Viral PCR for flu, RSV and COVID were negative. Para-influenza Virus 3 was positive. hCG was less than 5. Chest  x-ray revealed bibasilar patchy opacities which could be either atelectasis or infection. Given her history of recurrent car travel in context of morbid obesity CTA of the chest to rule out PE was completed. No evidence of PE but there were patchy bilateral groundglass opacities greatest in the left upper lobe favoring atypical infection versus edema.   PCCM were asked to see as a pulmonary / ICU consult for Acute on Chronic Hypercapnic respiratory failure.   ABG's concerning for worsening hypercapnic failure >> 7/32/90/64/46.4 92.7% sat, After this ABG patient was placed on BiPAP.  Pertinent  Medical History    Past Medical History:  Diagnosis Date   Anxiety    Asthma    COPD (chronic obstructive pulmonary disease) (HCC)    Depression    Hypertension    Major depression    PTSD (post-traumatic stress disorder)      Significant Hospital Events: Including procedures, antibiotic start and stop dates in addition to other pertinent events   04/10/2023 Admission to [facility]  04/15/2023 BiPAP initiation  echocardiogram LVEF 60 to 65% with no Hall motion abnormality grade 1 diastolic dysfunction RV function normal 6/11 arterial blood gas reviewed and improved from day prior.  Still hypercarbic.  Fluid balance -8 L. 6/12 Refused BIPAP overnight due to anxiety/pain. Confused but protecting airway this am   Interim History / Subjective:  Alert but confused with difficulty finding her words. Appears hypercapnic   Objective   Blood pressure (!) 130/99, pulse 97, temperature 98.1 F (36.7 C), temperature source Oral, resp. rate 20, weight 121.6  kg, SpO2 96 %.    FiO2 (%):  [40 %] 40 %   Intake/Output Summary (Last 24 hours) at 04/17/2023 1118 Last data filed at 04/17/2023 0300 Gross per 24 hour  Intake --  Output 700 ml  Net -700 ml    Filed Weights   04/10/23 2102 04/12/23 0359  Weight: 122.5 kg 121.6 kg    Examination: General: Acute on chronically ill appearing morbidly obese  female,  in NAD HEENT: [place]/AT, MM pink/moist, PERRL,  Neuro: Alert but confused, difficulty finding words and answeing question  CV: s1s2 regular rate and rhythm, no murmur, rubs, or gallops,  PULM:  Diminished bilaterally, on [place]. No increased work fo breathing  GI: soft, bowel sounds active in all 4 quadrants, non-tender, non-distended, tolerating oral diet Extremities: warm/dry, no edema  Skin: no rashes or lesions  Resolved Hospital Problem list     Assessment & Plan:  Acute on Chronic hypoxic/hypercarbic Respiratory Failure Parainfluenza pneumonia, acute bronchitis Acute COPD/asthma Current every day smoker  Acute on Chronic Heart Failure  Hyperkalemia Acute Encephalopathy 2/2 hypercarbia Diabetes mellitus type II, insulin-dependent, uncontrolled hyperglycemia secondary to steroids Peripheral neuropathy  Acute on chronic hypoxic and hypercarbic respiratory failure in the setting of decompensated OHS/OSA complicated further by recent parainfluenza pneumonitis with acute asthmatic exacerbation -Better after NIPPV P: Continue nocturnal BIPAP and as needed during day rest Would benefit from BIPAP trial now given concern for hypercapnia and no use of BIPAP overnight  Aggressively diurese as kidneys will allow  Remains on oral prednisone  Continue scheduled bronchodilators Will need to set up it NIV prior to discharge but will need to ensure patient is compliant with BIPAP use first  Will need outpatient pulmonary follow up   Best Practice (right click and "Reselect all SmartList Selections" daily)  Per primary   Critical Care time NA  [name], NP-C [facility] Personal contact information can be found on Amion  If no contact or response made please call [phone] 04/17/2023, 11:55 AM

## 2023-04-17 NOTE — Progress Notes (Signed)
Physical Therapy Treatment Patient Details Name: Cathy Hall MRN: [id number] DOB: [birth year] Today's Date: 04/17/2023   History of Present Illness 52 y.o. female adm 6/5 with medical history significant of morbid obesity with a BMI of 51, COPD/asthma, diabetes, diastolic dysfunction, hypertension, remote CVA and peripheral neuropathy.  Patient presented with cough congestion and elevated blood glucose.    PT Comments    PT came to see pt at the encouragement of MD and nursing, and was only able to sit on side of bed with supervision.  Pt received a call from her husband who was not able to get much conversation, clearly pt was tired.  Reviewed some mobility and noted sats were 97% sitting with difficulty keeping probe on her finger.  Pt returned to bed tired and could only do a minor amount of stretch on legs after the effort to sit.  Follow along with her to progress mobility as she is tired, continue to review her progress regarding recommendations for follow up care.     Recommendations for follow up therapy are one component of a multi-disciplinary discharge planning process, led by the attending physician.  Recommendations may be updated based on patient status, additional functional criteria and insurance authorization.  Follow Up Recommendations       Assistance Recommended at Discharge Intermittent Supervision/Assistance  Patient can return home with the following A little help with walking and/or transfers;A little help with bathing/dressing/bathroom;Assistance with cooking/housework;Assist for transportation;Help with stairs or ramp for entrance   Equipment Recommendations  Rollator (4 wheels)    Recommendations for Other Services       Precautions / Restrictions Precautions Precautions: Fall Precaution Comments: Watch O2 Restrictions Weight Bearing Restrictions: No Other Position/Activity Restrictions: brace is not in hosp     Mobility  Bed  Mobility Overal bed mobility: Needs Assistance Bed Mobility: Supine to Sit, Sit to Supine     Supine to sit: Min guard, Min assist Sit to supine: Min assist   General bed mobility comments: reminders for use of UE's and pt is using elevated HOB    Transfers Overall transfer level: Needs assistance   Transfers: Bed to chair/wheelchair/BSC            Lateral/Scoot Transfers: Min guard General transfer comment: pt not able to stand but scooted up side of bed    Ambulation/Gait               General Gait Details: unable to try   Stairs             Wheelchair Mobility    Modified Rankin (Stroke Patients Only)       Balance     Sitting balance-Leahy Scale: Good Sitting balance - Comments: side of bed with hands to steady herself                                    Cognition Arousal/Alertness: Awake/alert Behavior During Therapy: Flat affect Overall Cognitive Status: Impaired/Different from baseline Area of Impairment: Following commands, Awareness, Attention                 Orientation Level: Time Current Attention Level: Selective Memory: Decreased short-term memory Following Commands: Follows one step commands with increased time       General Comments: sat on side of bed        Exercises General Exercises - Lower Extremity Ankle Circles/Pumps: AAROM, 5 reps Quad Sets:  AROM, 10 reps Gluteal Sets: AROM, 10 reps    General Comments General comments (skin integrity, edema, etc.): Pt was up on side of bed and reviewed ROM to legs but by the time she was up, fatigued and wanted to return to bed      Pertinent Vitals/Pain Pain Assessment Pain Assessment: Faces Faces Pain Scale: Hurts a little bit Pain Location: general soreness    Home Living                          Prior Function            PT Goals (current goals can now be found in the care plan section) Progress towards PT goals: Not  progressing toward goals - comment    Frequency    Min 2X/week      PT Plan Current plan remains appropriate    Co-evaluation              AM-PAC PT "6 Clicks" Mobility   Outcome Measure  Help needed turning from your back to your side while in a flat bed without using bedrails?: A Little Help needed moving from lying on your back to sitting on the side of a flat bed without using bedrails?: A Little Help needed moving to and from a bed to a chair (including a wheelchair)?: A Little Help needed standing up from a chair using your arms (e.g., wheelchair or bedside chair)?: A Little   Help needed climbing 3-5 steps with a railing? : Total 6 Click Score: 13    End of Session Equipment Utilized During Treatment: Oxygen Activity Tolerance: Patient limited by fatigue Patient left: in bed;with call bell/phone within reach;with bed alarm set Nurse Communication: Mobility status PT Visit Diagnosis: Other abnormalities of gait and mobility (R26.89)     Time: 1610-9604 PT Time Calculation (min) (ACUTE ONLY): 17 min  Charges:  $Therapeutic Activity: 8-22 mins    Ivar Drape 04/17/2023, 6:58 PM  Samul Dada, PT PhD Acute Rehab Dept. Number: [facility] [id number] and [facility] [phone]

## 2023-04-18 LAB — BASIC METABOLIC PANEL
Anion gap: 11 (ref 5–15)
BUN: 37 mg/dL — ABNORMAL HIGH (ref 6–20)
CO2: 27 mmol/L (ref 22–32)
Calcium: 9.4 mg/dL (ref 8.9–10.3)
Chloride: 97 mmol/L — ABNORMAL LOW (ref 98–111)
Creatinine, Ser: 0.91 mg/dL (ref 0.44–1.00)
GFR, Estimated: >60 mL/min (ref >60)
Glucose, Bld: 114 mg/dL — ABNORMAL HIGH (ref 70–99)
Potassium: 3.4 mmol/L — ABNORMAL LOW (ref 3.5–5.1)
Sodium: 135 mmol/L (ref 135–145)

## 2023-04-18 LAB — GLUCOSE, CAPILLARY
Glucose-Capillary: 111 mg/dL — ABNORMAL HIGH (ref 70–99)
Glucose-Capillary: 140 mg/dL — ABNORMAL HIGH (ref 70–99)
Glucose-Capillary: 223 mg/dL — ABNORMAL HIGH (ref 70–99)
Glucose-Capillary: 228 mg/dL — ABNORMAL HIGH (ref 70–99)

## 2023-04-18 LAB — CBC
HCT: 43.3 % (ref 36.0–46.0)
Hemoglobin: 14.1 g/dL (ref 12.0–15.0)
MCH: 29.4 pg (ref 26.0–34.0)
MCHC: 32.6 g/dL (ref 30.0–36.0)
MCV: 90.2 fL (ref 80.0–100.0)
Platelets: 346 10*3/uL (ref 150–400)
RBC: 4.8 MIL/uL (ref 3.87–5.11)
RDW: 13.7 % (ref 11.5–15.5)
WBC: 12.2 10*3/uL — ABNORMAL HIGH (ref 4.0–10.5)
nRBC: 0 % (ref 0.0–0.2)

## 2023-04-18 LAB — MAGNESIUM: Magnesium: 2.2 mg/dL (ref 1.7–2.4)

## 2023-04-18 MED ORDER — POTASSIUM CHLORIDE CRYS ER 20 MEQ PO TBCR
20.0000 meq | EXTENDED_RELEASE_TABLET | Freq: Once | ORAL | Status: AC
  Administered 2023-04-18: 20 meq via ORAL
  Filled 2023-04-18: qty 1

## 2023-04-18 NOTE — Progress Notes (Signed)
   04/18/23 2335  BiPAP/CPAP/SIPAP  BiPAP/CPAP/SIPAP Pt Type Adult  BiPAP/CPAP/SIPAP Resmed  Mask Type Full face mask  Mask Size Medium  FiO2 (%) 21 %  Patient Home Equipment Yes  Auto Titrate No  Safety Check Completed by RT for Home Unit Yes, no issues noted

## 2023-04-18 NOTE — Progress Notes (Signed)
   04/17/23 2325  BiPAP/CPAP/SIPAP  Reason BIPAP/CPAP not in use Non-compliant

## 2023-04-18 NOTE — TOC Progression Note (Addendum)
Transition of Care [facility]) - Progression Note    Patient Details  Name: Cathy Hall MRN: [id number] Date of Birth: May 24, 1971  [facility] CM/SW Contact  Graves-Bigelow, Lamar Laundry, RN Phone Number: 04/18/2023, 3:27 PM  Clinical Narrative:  [employer] provided the patient with a loaner BIPAP for home. Case Manager has reached out to [facility] to see if they can accept the patient for home health services- Awaiting to hear back from the office.   [facility] can provide charity [facility] PT services for the patient. Patient will need orders once stable to transition home. No further needs identified.   Medications will need to be sent to [facility] and [facility] assistance if cost is too expensive for the patient. FC has reviewed the patient and she is now pending Medicaid. No further needs from this Case Manager   Expected Discharge Plan: Home w Home Health Services Barriers to Discharge: Continued Medical Work up  Expected Discharge Plan and Services In-house Referral: Clinical Social Work Discharge Planning Services: CM Consult Post Acute Care Choice: NA Living arrangements for the past 2 months: Apartment                 DME Arranged: Bipap DME Agency: [facility] Date DME Agency Contacted: 04/17/23 Time DME Agency Contacted: [id number] Representative spoke with at DME Agency: [facility] HH Arranged: PT, OT HH Agency: [facility] Date HH Agency Contacted: 04/17/23 Time HH Agency Contacted: [phone] Representative spoke with at [facility] Agency: Rivka Barbara   Social Determinants of Health (SDOH) Interventions SDOH Screenings   Food Insecurity: Food Insecurity Present (04/11/2023)  Housing: Medium Risk (04/11/2023)  Transportation Needs: Unmet Transportation Needs (04/11/2023)  Utilities: Not At Risk (04/11/2023)  Alcohol Screen: Low Risk  (11/21/2021)  Depression (PHQ2-9): Medium Risk (08/07/2020)  Tobacco Use: High Risk (04/10/2023)    Readmission Risk Interventions     11/16/2020   11:53 AM  Readmission Risk Prevention Plan  Transportation Screening Complete  PCP or Specialist Appt within 3-5 Days Complete  HRI or Home Care Consult Complete  Social Work Consult for Recovery Care Planning/Counseling Complete  Palliative Care Screening Complete  Medication Review Oceanographer) Complete

## 2023-04-18 NOTE — Progress Notes (Signed)
Mobility Specialist Progress Note:   04/18/23 1605  Mobility  Activity Ambulated with assistance in hallway  Level of Assistance Contact guard assist, steadying assist  Assistive Device Four wheel walker (bari)  Distance Ambulated (ft) 50 ft  Activity Response Tolerated well  Mobility Referral Yes  $Mobility charge 1 Mobility  Mobility Specialist Start Time (ACUTE ONLY) 1605  Mobility Specialist Stop Time (ACUTE ONLY) 1625  Mobility Specialist Time Calculation (min) (ACUTE ONLY) 20 min   Pt agreeable to mobility session. Required only minG assist during ambulation. X1 seated rest break required d/t fatigue. VSS on RA, SpO2 92%. Pt back in bed with all needs met, alarm on. Encouraged frequent ambulation, pt voiced understanding.   Addison Lank Mobility Specialist Please contact via SecureChat or  Rehab office at [phone]

## 2023-04-18 NOTE — Progress Notes (Signed)
PROGRESS NOTE Cathy Hall  OZH:[id number] DOB: [birth year] DOA: 04/10/2023 PCP: Patient, No Pcp Per  Brief Narrative/Hospital Course: 51yof w/ diastolic CHF, polysubstance abuse, morbid obesity, COPD, CVA, HTN, DM 2 peripheral neuropathy presented to the ED with cough congestion elevated blood glucose.  He recently hospitalized for CHF exacerbation and epilepticus and 24 in [place] and discharged-but has not taken Lasix x 5 days and most of her medication due to lack of insurance In the ED hypertensive saturation hypoxic up to 87% when supine or when asleep, labs showed mildly elevated troponin viral PCR positive for parainfluenza hCG less than 5 chest x-ray bibasilar patchy opacities CT of the chest no PE but patchy bilateral groundglass opacities greatest in the left upper lobe favoring atypical infection versus edema. Patient is admitted for further management    Subjective: Seen and examined resting comfortably about to have her meal.  On room air doing well, refused vital earlier but after my discussion willing to try BiPAP tonight   Assessment and Plan: Principal Problem:   Parainfluenza pneumonia Active Problems:   Acute on chronic diastolic CHF (congestive heart failure) (HCC)   Essential hypertension   Obesity, Class III, BMI 40-49.9 (morbid obesity) (HCC)   PVD (peripheral vascular disease) (HCC)   Chronic obstructive pulmonary disease (HCC)   Dyslipidemia (high LDL; low HDL)   Polysubstance abuse (HCC)   Left upper lobe pneumonia   COPD exacerbation (HCC)   Acute on chronic respiratory failure with hypoxia and hypercapnia (HCC)   Acute mild encephalopathy/drug secondary to CO2 narcosis: Currently alert awake oriented mental status improved, continue on BiPAP nightly, has been refusing but agreeable to try tonight.  Acute respiratory distress Parainfluenza pneumonia COPD/asthma exacerbation Acute on chronic hypoxic/hypercapnic respiratory failure   Everyday smoker: Respiratory status has improved/stabilized, continue on Pulmicort, antitussive, DuoNeb, Solu-Medrol daily, Singulair and Dulera.  Seen by pulmonary> recommending to continue BiPAP nightly 12/6 FiO2 40% and follow-up with pulmonary clinic for assessment of Biber at outpatient discussed this patient with the patient's is agreeable to try BiPAP tonight  ,TOC arranging BiPAP if able to demonstrate that she can use  Acute exacerbation of chronic diastolic CHF: Volume status improved, monitor fluid status, Net IO Since Admission: -9,300.17 mL [04/18/23 1403]    Mild AKI Hyperkalemia: Potassium and creatinine has stabilized.  Potassium on lower side is replete with K-Dur 20 meq. Monitor  Cerebrovascular disease Hypertension Hyperlipidemia: Continue atorvasatatin, Plavix.  Continue home ARB.  BP stable, denies any chest pain   T2DM insulin-dependent, W/ uncontrolled hyperglycemia secondary to steroids Peripheral neuropathy: Holding metformin continue Semglee 45 units daily, 12 units Premeal and SSI and continue to adjust.Continue gabapentin  Depression/anxiety: Continue home Prozac   Morbid obesity with BMI 50.6: would benefit from outpatient bariatric referral  DVT prophylaxis: enoxaparin (LOVENOX) injection 40 mg Start: 04/11/23 0745 Code Status:   Code Status: Full Code Family Communication: plan of care discussed with patient at bedside. Patient status is: Inpatient because of respiratory failure Level of care: Progressive   Dispo: The patient is from: home            Anticipated disposition: home w/ HH 1-2 days Objective: Vitals last 24 hrs: Vitals:   04/18/23 0600 04/18/23 0804 04/18/23 0805 04/18/23 0814  BP:    124/83  Pulse: 76   90  Resp: 18   20  Temp:    97.8 F (36.6 C)  TempSrc:    Oral  SpO2: 94% 96% 98% 91%  Weight:  Height:       Weight change:   Physical Examination: General exam: alert awake, older than stated age HEENT:Oral mucosa  moist, Ear/Nose WNL grossly Respiratory system: bilaterally diminished BS,no use of accessory muscle Cardiovascular system: S1 & S2 +, No JVD. Gastrointestinal system: Abdomen soft,NT,ND, BS+ Nervous System:Alert, awake, moving extremities. Extremities: LE edema neg,distal peripheral pulses palpable.  Skin: No rashes,no icterus. MSK: Normal muscle bulk,tone, power  Medications reviewed:  Scheduled Meds:  atorvastatin  40 mg Oral Daily   budesonide (PULMICORT) nebulizer solution  0.25 mg Nebulization BID   clopidogrel  75 mg Oral Daily   enoxaparin (LOVENOX) injection  40 mg Subcutaneous Q24H   feeding supplement  237 mL Oral BID BM   FLUoxetine  40 mg Oral Daily   guaiFENesin  600 mg Oral BID   insulin aspart  0-20 Units Subcutaneous TID WC   insulin aspart  0-5 Units Subcutaneous QHS   insulin aspart  12 Units Subcutaneous TID WC   insulin glargine-yfgn  45 Units Subcutaneous Q2200   ipratropium-albuterol  3 mL Nebulization BID   irbesartan  75 mg Oral Daily   methylPREDNISolone (SOLU-MEDROL) injection  40 mg Intravenous Daily   mometasone-formoterol  2 puff Inhalation BID   montelukast  10 mg Oral QHS   pantoprazole  40 mg Oral Daily   sodium chloride flush  3 mL Intravenous Q12H   Continuous Infusions:    Diet Order             Diet heart healthy/carb modified Room service appropriate? Yes; Fluid consistency: Thin  Diet effective now                  Intake/Output Summary (Last 24 hours) at 04/18/2023 1404 Last data filed at 04/18/2023 1216 Gross per 24 hour  Intake 720 ml  Output 1100 ml  Net -380 ml   Net IO Since Admission: -9,300.17 mL [04/18/23 1404]  Wt Readings from Last 3 Encounters:  04/12/23 121.6 kg  01/31/23 112 kg  11/26/21 114.2 kg     Unresulted Labs (From admission, onward)     Start     Ordered   04/18/23 0500  Creatinine, serum  (enoxaparin (LOVENOX)    CrCl >/= 30 ml/min)  Weekly,   R     Comments: while on enoxaparin therapy     04/11/23 0742   04/13/23 0500  Basic metabolic panel  Daily,   R     Question:  Specimen collection method  Answer:  Lab=Lab collect   04/12/23 0814   04/13/23 0500  CBC  Daily,   R     Question:  Specimen collection method  Answer:  Lab=Lab collect   04/12/23 0814   04/13/23 0500  Magnesium  Daily,   R     Question:  Specimen collection method  Answer:  Lab=Lab collect   04/12/23 0814          Data Reviewed: I have personally reviewed following labs and imaging studies CBC: Recent Labs  Lab 04/14/23 0246 04/15/23 0300 04/16/23 0214 04/17/23 0122 04/18/23 0612  WBC 15.0* 15.0* 10.5 13.1* 12.2*  HGB 12.5 13.3 13.4 13.3 14.1  HCT 40.6 44.1 43.1 41.7 43.3  MCV 94.4 98.4 96.6 92.5 90.2  PLT 330 338 344 325 346   Basic Metabolic Panel: Recent Labs  Lab 04/14/23 0246 04/15/23 0300 04/16/23 0214 04/17/23 0122 04/18/23 0612  NA 135 138 136 135 135  K 5.0 5.1 4.9 3.9 3.4*  CL  94* 92* 88* 91* 97*  CO2 32 40* 36* 32 27  GLUCOSE 295* 144* 192* 119* 114*  BUN 12 14 36* 40* 37*  CREATININE 0.94 0.93 1.27* 1.14* 0.91  CALCIUM 9.1 9.7 9.6 9.1 9.4  MG 2.2 2.2 2.0 2.2 2.2  CBG: Recent Labs  Lab 04/17/23 1200 04/17/23 1626 04/17/23 2146 04/18/23 0813 04/18/23 1125  GLUCAP 161* 223* 144* 111* 140*   Recent Labs  Lab 04/12/23 0238  PROCALCITON <0.10    Recent Results (from the past 240 hour(s))  Resp panel by RT-PCR (RSV, Flu A&B, Covid) Anterior Nasal Swab     Status: None   Collection Time: 04/10/23  9:06 PM   Specimen: Anterior Nasal Swab  Result Value Ref Range Status   SARS Coronavirus 2 by RT PCR NEGATIVE NEGATIVE Final   Influenza A by PCR NEGATIVE NEGATIVE Final   Influenza B by PCR NEGATIVE NEGATIVE Final    Comment: (NOTE) The Xpert Xpress SARS-CoV-2/FLU/RSV plus assay is intended as an aid in the diagnosis of influenza from Nasopharyngeal swab specimens and should not be used as a sole basis for treatment. Nasal washings and aspirates are unacceptable  for Xpert Xpress SARS-CoV-2/FLU/RSV testing.  Fact Sheet for Patients: BloggerCourse.com  Fact Sheet for Healthcare Providers: SeriousBroker.it  This test is not yet approved or cleared by the [place] FDA and has been authorized for detection and/or diagnosis of SARS-CoV-2 by FDA under an Emergency Use Authorization (EUA). This EUA will remain in effect (meaning this test can be used) for the duration of the COVID-19 declaration under Section 564(b)(1) of the Act, 21 U.S.C. section 360bbb-3(b)(1), unless the authorization is terminated or revoked.     Resp Syncytial Virus by PCR NEGATIVE NEGATIVE Final    Comment: (NOTE) Fact Sheet for Patients: BloggerCourse.com  Fact Sheet for Healthcare Providers: SeriousBroker.it  This test is not yet approved or cleared by the [place] FDA and has been authorized for detection and/or diagnosis of SARS-CoV-2 by FDA under an Emergency Use Authorization (EUA). This EUA will remain in effect (meaning this test can be used) for the duration of the COVID-19 declaration under Section 564(b)(1) of the Act, 21 U.S.C. section 360bbb-3(b)(1), unless the authorization is terminated or revoked.  Performed at [facility], [address] [place], [place] [address]   Respiratory (~20 pathogens) panel by PCR     Status: Abnormal   Collection Time: 04/10/23  9:06 PM   Specimen: Nasopharyngeal Swab; Respiratory  Result Value Ref Range Status   Adenovirus NOT DETECTED NOT DETECTED Final   Coronavirus 229E NOT DETECTED NOT DETECTED Final    Comment: (NOTE) The Coronavirus on the Respiratory Panel, DOES NOT test for the novel  Coronavirus (2019 nCoV)    Coronavirus HKU1 NOT DETECTED NOT DETECTED Final   Coronavirus NL63 NOT DETECTED NOT DETECTED Final   Coronavirus OC43 NOT DETECTED NOT DETECTED Final   Metapneumovirus NOT DETECTED NOT  DETECTED Final   Rhinovirus / Enterovirus NOT DETECTED NOT DETECTED Final   Influenza A NOT DETECTED NOT DETECTED Final   Influenza B NOT DETECTED NOT DETECTED Final   Parainfluenza Virus 1 NOT DETECTED NOT DETECTED Final   Parainfluenza Virus 2 NOT DETECTED NOT DETECTED Final   Parainfluenza Virus 3 DETECTED (A) NOT DETECTED Final   Parainfluenza Virus 4 NOT DETECTED NOT DETECTED Final   Respiratory Syncytial Virus NOT DETECTED NOT DETECTED Final   Bordetella pertussis NOT DETECTED NOT DETECTED Final   Bordetella Parapertussis NOT DETECTED  NOT DETECTED Final   Chlamydophila pneumoniae NOT DETECTED NOT DETECTED Final   Mycoplasma pneumoniae NOT DETECTED NOT DETECTED Final    Comment: Performed at [facility], [address] [place], [place] [address]    Antimicrobials: Anti-infectives (From admission, onward)    Start     Dose/Rate Route Frequency Ordered Stop   04/12/23 0200  cefTRIAXone (ROCEPHIN) 1 g in sodium chloride 0.9 % 100 mL IVPB  Status:  Discontinued        1 g 200 mL/hr over 30 Minutes Intravenous Every 24 hours 04/11/23 0732 04/12/23 0812   04/12/23 0200  azithromycin (ZITHROMAX) 500 mg in sodium chloride 0.9 % 250 mL IVPB  Status:  Discontinued        500 mg 250 mL/hr over 60 Minutes Intravenous Every 24 hours 04/11/23 0732 04/12/23 0812   04/11/23 0115  cefTRIAXone (ROCEPHIN) 1 g in sodium chloride 0.9 % 100 mL IVPB        1 g 200 mL/hr over 30 Minutes Intravenous  Once 04/11/23 0103 04/11/23 0215   04/11/23 0115  azithromycin (ZITHROMAX) 500 mg in sodium chloride 0.9 % 250 mL IVPB        500 mg 250 mL/hr over 60 Minutes Intravenous  Once 04/11/23 0103 04/11/23 0225   04/11/23 0000  azithromycin (ZITHROMAX) 250 MG tablet        250 mg Oral Daily 04/11/23 0252     04/11/23 0000  amoxicillin-clavulanate (AUGMENTIN) 875-125 MG tablet        1 tablet Oral Every 12 hours 04/11/23 0252        Culture/Microbiology    Component Value Date/Time   SDES   11/24/2021 1750    URINE, CLEAN CATCH Performed at [facility], [address] [place], [place] [address]    SPECREQUEST  11/24/2021 1750    NONE Performed at [facility], [address] [place], [place] [address]    CULT MULTIPLE SPECIES PRESENT, SUGGEST RECOLLECTION (A) 11/24/2021 1750   REPTSTATUS 11/27/2021 FINAL 11/24/2021 1750   Radiology Studies: No results found.   LOS: 4 days   Lanae Boast, MD [facility]  04/18/2023, 2:04 PM

## 2023-04-19 LAB — MAGNESIUM: Magnesium: 2.1 mg/dL (ref 1.7–2.4)

## 2023-04-19 LAB — BASIC METABOLIC PANEL
Anion gap: 12 (ref 5–15)
BUN: 38 mg/dL — ABNORMAL HIGH (ref 6–20)
CO2: 24 mmol/L (ref 22–32)
Calcium: 9.3 mg/dL (ref 8.9–10.3)
Chloride: 99 mmol/L (ref 98–111)
Creatinine, Ser: 1.23 mg/dL — ABNORMAL HIGH (ref 0.44–1.00)
GFR, Estimated: 54 mL/min — ABNORMAL LOW (ref >60)
Glucose, Bld: 117 mg/dL — ABNORMAL HIGH (ref 70–99)
Potassium: 3.6 mmol/L (ref 3.5–5.1)
Sodium: 135 mmol/L (ref 135–145)

## 2023-04-19 LAB — CBC
HCT: 42.3 % (ref 36.0–46.0)
Hemoglobin: 14.1 g/dL (ref 12.0–15.0)
MCH: 30.3 pg (ref 26.0–34.0)
MCHC: 33.3 g/dL (ref 30.0–36.0)
MCV: 91 fL (ref 80.0–100.0)
Platelets: 341 10*3/uL (ref 150–400)
RBC: 4.65 MIL/uL (ref 3.87–5.11)
RDW: 13.9 % (ref 11.5–15.5)
WBC: 13.1 10*3/uL — ABNORMAL HIGH (ref 4.0–10.5)
nRBC: 0 % (ref 0.0–0.2)

## 2023-04-19 LAB — GLUCOSE, CAPILLARY
Glucose-Capillary: 157 mg/dL — ABNORMAL HIGH (ref 70–99)
Glucose-Capillary: 163 mg/dL — ABNORMAL HIGH (ref 70–99)
Glucose-Capillary: 198 mg/dL — ABNORMAL HIGH (ref 70–99)
Glucose-Capillary: 194 mg/dL — ABNORMAL HIGH (ref 70–99)

## 2023-04-19 MED ORDER — INSULIN ASPART 100 UNIT/ML IJ SOLN
10.0000 [IU] | Freq: Three times a day (TID) | INTRAMUSCULAR | Status: DC
  Administered 2023-04-19 (×2): 10 [IU] via SUBCUTANEOUS

## 2023-04-19 MED ORDER — INSULIN GLARGINE-YFGN 100 UNIT/ML ~~LOC~~ SOLN
35.0000 [IU] | Freq: Every day | SUBCUTANEOUS | Status: DC
  Administered 2023-04-19: 35 [IU] via SUBCUTANEOUS
  Filled 2023-04-19 (×2): qty 0.35

## 2023-04-19 NOTE — Progress Notes (Signed)
PROGRESS NOTE Cathy Hall  ZOX:[id number] DOB: 1971/11/02 DOA: 04/10/2023 PCP: Patient, No Pcp Per  Brief Narrative/Hospital Course: 51yof w/ diastolic CHF, polysubstance abuse, morbid obesity, COPD, CVA, HTN, DM 2 peripheral neuropathy presented to the ED with cough congestion elevated blood glucose.  He recently hospitalized for CHF exacerbation and epilepticus and 24 in [place] and discharged-but has not taken Lasix x 5 days and most of her medication due to lack of insurance In the ED hypertensive saturation hypoxic up to 87% when supine or when asleep, labs showed mildly elevated troponin viral PCR positive for parainfluenza hCG less than 5 chest x-ray bibasilar patchy opacities CT of the chest no PE but patchy bilateral groundglass opacities greatest in the left upper lobe favoring atypical infection versus edema. Patient is admitted for further management    Subjective: Patient seen and examined this morning.  Resting comfortably used BiPAP last night    Assessment and Plan: Principal Problem:   Parainfluenza pneumonia Active Problems:   Acute on chronic diastolic CHF (congestive heart failure) (HCC)   Essential hypertension   Obesity, Class III, BMI 40-49.9 (morbid obesity) (HCC)   PVD (peripheral vascular disease) (HCC)   Chronic obstructive pulmonary disease (HCC)   Dyslipidemia (high LDL; low HDL)   Polysubstance abuse (HCC)   Left upper lobe pneumonia   COPD exacerbation (HCC)   Acute on chronic respiratory failure with hypoxia and hypercapnia (HCC)   Acute mild encephalopathy/drug secondary to CO2 narcosis: Currently alert awake oriented mental status improved and at baseline   Acute respiratory distress Parainfluenza pneumonia COPD/asthma exacerbation Acute on chronic hypoxic/hypercapnic respiratory failure  Everyday smoker: Respiratory status has improved/stabilized, continue on Pulmicort, antitussive, DuoNeb.  No longer wheezing will discontinue  Solu-Medrol and monitor her blood glucose. continue Singulair, Dulera bronchodilators at home.  Follow-up with PCP and pulmonary pulmonary recommended  to continue BiPAP nightly 12/6 FiO2 40% and follow-up with pulmonary clinic for assessment of Bipap. She used BiPAP 6/13 night and did well.  BiPAP has been arranged by TOC.     Acute exacerbation of chronic diastolic CHF: Volume status improved, monitor fluid status. On dc cont po lasix.   Mild AKI Hyperkalemia: Labs improved.     Cerebrovascular disease Hypertension Hyperlipidemia: Continue atorvasatatin, Plavix.  Continue home ARB.  BP stable, denies any chest pain   T2DM insulin-dependent, W/ uncontrolled hyperglycemia secondary to steroids Peripheral neuropathy: Resume metformin upon discharge.  Going going to stop steroids so we will watch her blood sugar, will likely need less amount of insulin, has not been on insulin before continue Premeal and long-acting insulin at slightly reduced dose along with SSI, continue gabapentin.   Recent Labs  Lab 04/18/23 0813 04/18/23 1125 04/18/23 1726 04/18/23 2121 04/19/23 0837  GLUCAP 111* 140* 223* 228* 157*      Depression/anxiety: Continue home Prozac   Morbid obesity with BMI 50.6: would benefit from outpatient bariatric referral  DVT prophylaxis: enoxaparin (LOVENOX) injection 40 mg Start: 04/11/23 0745 Code Status:   Code Status: Full Code Family Communication: plan of care discussed with patient at bedside. Patient status is: Inpatient because of respiratory failure Level of care: Progressive   Dispo: The patient is from: home            Anticipated disposition: home w/ HH 1-2 days Objective: Vitals last 24 hrs: Vitals:   04/19/23 0300 04/19/23 0400 04/19/23 0800 04/19/23 0802  BP:  135/86    Pulse: 79 92    Resp:  20  Temp:  98.3 F (36.8 C)    TempSrc:  Oral    SpO2: 98% 97% 96% 96%  Weight:      Height:       Weight change:   Physical  Examination: General exam: alert awake, oriented, older than stated age HEENT:Oral mucosa moist, Ear/Nose WNL grossly Respiratory system: Bilaterally diminished breath sounds, no use of accessory muscle Cardiovascular system: S1 & S2 +, No JVD. Gastrointestinal system: Abdomen soft,NT,ND, BS+ Nervous System: Alert, awake, moving extremities, follows commands. Extremities: LE edema neg,distal peripheral pulses palpable.  Skin:No rashes,no icterus. QIH:[id number] muscle bulk,tone, power   Medications reviewed:  Scheduled Meds:  atorvastatin  40 mg Oral Daily   budesonide (PULMICORT) nebulizer solution  0.25 mg Nebulization BID   clopidogrel  75 mg Oral Daily   enoxaparin (LOVENOX) injection  40 mg Subcutaneous Q24H   feeding supplement  237 mL Oral BID BM   FLUoxetine  40 mg Oral Daily   guaiFENesin  600 mg Oral BID   insulin aspart  0-20 Units Subcutaneous TID WC   insulin aspart  0-5 Units Subcutaneous QHS   insulin aspart  10 Units Subcutaneous TID WC   insulin glargine-yfgn  35 Units Subcutaneous Q2200   ipratropium-albuterol  3 mL Nebulization BID   irbesartan  75 mg Oral Daily   mometasone-formoterol  2 puff Inhalation BID   montelukast  10 mg Oral QHS   pantoprazole  40 mg Oral Daily   sodium chloride flush  3 mL Intravenous Q12H   Continuous Infusions:    Diet Order             Diet heart healthy/carb modified Room service appropriate? Yes; Fluid consistency: Thin  Diet effective now                  Intake/Output Summary (Last 24 hours) at 04/19/2023 1027 Last data filed at 04/18/2023 2200 Gross per 24 hour  Intake 1016 ml  Output --  Net 1016 ml    Net IO Since Admission: -8,524.17 mL [04/19/23 1027]  Wt Readings from Last 3 Encounters:  04/12/23 121.6 kg  01/31/23 112 kg  11/26/21 114.2 kg     Unresulted Labs (From admission, onward)     Start     Ordered   04/18/23 0500  Creatinine, serum  (enoxaparin (LOVENOX)    CrCl >/= 30 ml/min)  Weekly,   R      Comments: while on enoxaparin therapy    04/11/23 0742          Data Reviewed: I have personally reviewed following labs and imaging studies CBC: Recent Labs  Lab 04/15/23 0300 04/16/23 0214 04/17/23 0122 04/18/23 0612 04/19/23 0148  WBC 15.0* 10.5 13.1* 12.2* 13.1*  HGB 13.3 13.4 13.3 14.1 14.1  HCT 44.1 43.1 41.7 43.3 42.3  MCV 98.4 96.6 92.5 90.2 91.0  PLT 338 344 325 346 341    Basic Metabolic Panel: Recent Labs  Lab 04/15/23 0300 04/16/23 0214 04/17/23 0122 04/18/23 0612 04/19/23 0148  NA 138 136 135 135 135  K 5.1 4.9 3.9 3.4* 3.6  CL 92* 88* 91* 97* 99  CO2 40* 36* 32 27 24  GLUCOSE 144* 192* 119* 114* 117*  BUN 14 36* 40* 37* 38*  CREATININE 0.93 1.27* 1.14* 0.91 1.23*  CALCIUM 9.7 9.6 9.1 9.4 9.3  MG 2.2 2.0 2.2 2.2 2.1   CBG: Recent Labs  Lab 04/18/23 0813 04/18/23 1125 04/18/23 1726 04/18/23 2121 04/19/23 0837  GLUCAP  111* 140* 223* 228* 157*    No results for input(s): "PROCALCITON", "LATICACIDVEN" in the last 168 hours.   Recent Results (from the past 240 hour(s))  Resp panel by RT-PCR (RSV, Flu A&B, Covid) Anterior Nasal Swab     Status: None   Collection Time: 04/10/23  9:06 PM   Specimen: Anterior Nasal Swab  Result Value Ref Range Status   SARS Coronavirus 2 by RT PCR NEGATIVE NEGATIVE Final   Influenza A by PCR NEGATIVE NEGATIVE Final   Influenza B by PCR NEGATIVE NEGATIVE Final    Comment: (NOTE) The Xpert Xpress SARS-CoV-2/FLU/RSV plus assay is intended as an aid in the diagnosis of influenza from Nasopharyngeal swab specimens and should not be used as a sole basis for treatment. Nasal washings and aspirates are unacceptable for Xpert Xpress SARS-CoV-2/FLU/RSV testing.  Fact Sheet for Patients: BloggerCourse.com  Fact Sheet for Healthcare Providers: SeriousBroker.it  This test is not yet approved or cleared by the [place] FDA and has been authorized for detection  and/or diagnosis of SARS-CoV-2 by FDA under an Emergency Use Authorization (EUA). This EUA will remain in effect (meaning this test can be used) for the duration of the COVID-19 declaration under Section 564(b)(1) of the Act, 21 U.S.C. section 360bbb-3(b)(1), unless the authorization is terminated or revoked.     Resp Syncytial Virus by PCR NEGATIVE NEGATIVE Final    Comment: (NOTE) Fact Sheet for Patients: BloggerCourse.com  Fact Sheet for Healthcare Providers: SeriousBroker.it  This test is not yet approved or cleared by the [place] FDA and has been authorized for detection and/or diagnosis of SARS-CoV-2 by FDA under an Emergency Use Authorization (EUA). This EUA will remain in effect (meaning this test can be used) for the duration of the COVID-19 declaration under Section 564(b)(1) of the Act, 21 U.S.C. section 360bbb-3(b)(1), unless the authorization is terminated or revoked.  Performed at [facility], [address], [place], [place] [address]   Respiratory (~20 pathogens) panel by PCR     Status: Abnormal   Collection Time: 04/10/23  9:06 PM   Specimen: Nasopharyngeal Swab; Respiratory  Result Value Ref Range Status   Adenovirus NOT DETECTED NOT DETECTED Final   Coronavirus 229E NOT DETECTED NOT DETECTED Final    Comment: (NOTE) The Coronavirus on the Respiratory Panel, DOES NOT test for the novel  Coronavirus (2019 nCoV)    Coronavirus HKU1 NOT DETECTED NOT DETECTED Final   Coronavirus NL63 NOT DETECTED NOT DETECTED Final   Coronavirus OC43 NOT DETECTED NOT DETECTED Final   Metapneumovirus NOT DETECTED NOT DETECTED Final   Rhinovirus / Enterovirus NOT DETECTED NOT DETECTED Final   Influenza A NOT DETECTED NOT DETECTED Final   Influenza B NOT DETECTED NOT DETECTED Final   Parainfluenza Virus 1 NOT DETECTED NOT DETECTED Final   Parainfluenza Virus 2 NOT DETECTED NOT DETECTED Final   Parainfluenza Virus 3  DETECTED (A) NOT DETECTED Final   Parainfluenza Virus 4 NOT DETECTED NOT DETECTED Final   Respiratory Syncytial Virus NOT DETECTED NOT DETECTED Final   Bordetella pertussis NOT DETECTED NOT DETECTED Final   Bordetella Parapertussis NOT DETECTED NOT DETECTED Final   Chlamydophila pneumoniae NOT DETECTED NOT DETECTED Final   Mycoplasma pneumoniae NOT DETECTED NOT DETECTED Final    Comment: Performed at [facility], [address] [place], [place] [address]    Antimicrobials: Anti-infectives (From admission, onward)    Start     Dose/Rate Route Frequency Ordered Stop   04/12/23 0200  cefTRIAXone (ROCEPHIN) 1 g in sodium chloride 0.9 % 100 mL IVPB  Status:  Discontinued        1 g 200 mL/hr over 30 Minutes Intravenous Every 24 hours 04/11/23 0732 04/12/23 0812   04/12/23 0200  azithromycin (ZITHROMAX) 500 mg in sodium chloride 0.9 % 250 mL IVPB  Status:  Discontinued        500 mg 250 mL/hr over 60 Minutes Intravenous Every 24 hours 04/11/23 0732 04/12/23 0812   04/11/23 0115  cefTRIAXone (ROCEPHIN) 1 g in sodium chloride 0.9 % 100 mL IVPB        1 g 200 mL/hr over 30 Minutes Intravenous  Once 04/11/23 0103 04/11/23 0215   04/11/23 0115  azithromycin (ZITHROMAX) 500 mg in sodium chloride 0.9 % 250 mL IVPB        500 mg 250 mL/hr over 60 Minutes Intravenous  Once 04/11/23 0103 04/11/23 0225   04/11/23 0000  azithromycin (ZITHROMAX) 250 MG tablet        250 mg Oral Daily 04/11/23 0252     04/11/23 0000  amoxicillin-clavulanate (AUGMENTIN) 875-125 MG tablet        1 tablet Oral Every 12 hours 04/11/23 0252        Culture/Microbiology    Component Value Date/Time   SDES  11/24/2021 1750    URINE, CLEAN CATCH Performed at [facility], [address], [place], [place] [address]    SPECREQUEST  11/24/2021 1750    NONE Performed at [facility], [address] [place], [place] [address]    CULT MULTIPLE SPECIES PRESENT, SUGGEST  RECOLLECTION (A) 11/24/2021 1750   REPTSTATUS 11/27/2021 FINAL 11/24/2021 1750   Radiology Studies: No results found.   LOS: 5 days   Lanae Boast, MD [facility]  04/19/2023, 10:27 AM

## 2023-04-19 NOTE — Plan of Care (Signed)

## 2023-04-19 NOTE — Discharge Summary (Signed)
Physician Discharge Summary  Cathy Hall [facility] ZOX:[id number] DOB: 1971/01/26 DOA: 04/10/2023  PCP: Patient, No Pcp Per  Admit date: 04/10/2023 Discharge date: 04/21/2023 Recommendations for Outpatient Follow-up:  Follow up with PCP in 1 weeks-call for appointment Follow-up with PCCM office Please obtain BMP/CBC in one week  Discharge Dispo: home w/ hh Discharge Condition: Stable Code Status:   Code Status: Full Code Diet recommendation:  Diet Order             Diet heart healthy/carb modified Room service appropriate? Yes; Fluid consistency: Thin  Diet effective now                    Brief/Interim Summary: 51yof w/ diastolic CHF, polysubstance abuse, morbid obesity, COPD, CVA, HTN, DM 2 peripheral neuropathy presented to the ED with cough congestion elevated blood glucose.  He recently hospitalized for CHF exacerbation and epilepticus and 25 in [place] and discharged-but has not taken Lasix x 5 days and most of her medication due to lack of insurance In the ED hypertensive saturation hypoxic up to 87% when supine or when asleep, labs showed mildly elevated troponin viral PCR positive for parainfluenza hCG less than 5 chest x-ray bibasilar patchy opacities CT of the chest no PE but patchy bilateral groundglass opacities greatest in the left upper lobe favoring atypical infection versus edema. Patient is admitted and treated for acute respiratory distress due to COPD/asthma exacerbation acute on chronic hypoxic hypercapnic respiratory failure from parainfluenza pneumonia in the setting of smoking.  Seen by pulmonary, now needing BiPAP.  She had uncontrolled hyperglycemia in the setting of steroid use needing insulin Steroids discontinued, after which insulin requirement decreased, monitored additional days Blood sugar remains fairly controlled at this time we will continue on long-acting insulin only and have her check her blood sugar 4 times a day and follow-up with PCP to  adjust insulin, advised to cut down even stop the insulin if blood sugar starts to get lower  Discharge Diagnoses:  Principal Problem:   Parainfluenza pneumonia Active Problems:   Acute on chronic diastolic CHF (congestive heart failure) (HCC)   Essential hypertension   Obesity, Class III, BMI 40-49.9 (morbid obesity) (HCC)   PVD (peripheral vascular disease) (HCC)   Chronic obstructive pulmonary disease (HCC)   Dyslipidemia (high LDL; low HDL)   Polysubstance abuse (HCC)   Left upper lobe pneumonia   COPD exacerbation (HCC)   Acute on chronic respiratory failure with hypoxia and hypercapnia (HCC)   Acute mild encephalopathy/drug secondary to CO2 narcosis: Currently alert awake oriented mental status improved and at baseline  Acute respiratory distress Parainfluenza pneumonia COPD/asthma exacerbation Acute on chronic hypoxic/hypercapnic respiratory failure  Everyday smoker: Respiratory status has improved/stabilized, continue on Pulmicort, antitussive, DuoNeb, Solu-Medrol will be changed to oral prednisone for discharge continue Singulair, Dulera bronchodilators at home.  Follow-up with PCP and pulmonary pulmonary recommended  to continue BiPAP nightly 12/6 FiO2 40% and follow-up with pulmonary clinic for assessment of Bipap. She used BiPAP 6/13 night and did well.  BiPAP has been arranged by TOC.    Acute exacerbation of chronic diastolic CHF: Volume status improved, monitor fluid status  Mild AKI Hyperkalemia: Labs improved.    Cerebrovascular disease Hypertension Hyperlipidemia: Continue atorvasatatin, Plavix.  Continue home ARB.  BP stable, denies any chest pain   T2DM insulin-dependent, W/ uncontrolled hyperglycemia secondary to steroids Peripheral neuropathy: Resume metformin upon discharge continue long-acting insulin at 15 u.she had 20 units insulin ordered per prescription and I changed to  15 units, already has prescription at [facility] . Vony gabapentin and  follow-up with PCP to adjust insulin regimen  Recent Labs  Lab 04/19/23 2043 04/20/23 1207 04/20/23 1521 04/20/23 2109 04/21/23 0754  GLUCAP 194* 179* 197* 286* 197*     Depression/anxiety: Continue home Prozac   Morbid obesity with BMI 50.6: would benefit from outpatient bariatric referral   Consults: PCCM Subjective: Alert awake oriented breathing comfortably using BiPAP No complaints.    Discharge Exam: Vitals:   04/21/23 0738 04/21/23 0756  BP:  (!) 108/59  Pulse:  82  Resp:  20  Temp:  98.6 F (37 C)  SpO2: 99% 99%   General: Pt is alert, awake, not in acute distress Cardiovascular: RRR, S1/S2 +, no rubs, no gallops Respiratory: CTA bilaterally, no wheezing, no rhonchi Abdominal: Soft, NT, ND, bowel sounds + Extremities: no edema, no cyanosis  Discharge Instructions  Discharge Instructions     Discharge instructions   Complete by: As directed    Check blood sugar 3 times a day and bedtime at home. If blood sugar running above 200 less than 70 please call your MD to adjust insulin. If blood sugars running less 100 do not use insulin and call MD. If you noticed signs and symptoms of hypoglycemia or low blood sugar like jitteriness, confusion, thirst, tremor, sweating- Check blood sugar, drink sugary drink/biscuits/sweets to increase sugar level and call MD or return to ER.   Increase activity slowly   Complete by: As directed       Allergies as of 04/21/2023       Reactions   Nsaids Other (See Comments)    Kidney Disorder        Medication List     STOP taking these medications    liraglutide 18 MG/3ML Sopn Commonly known as: VICTOZA       TAKE these medications    acetaminophen 325 MG tablet Commonly known as: TYLENOL Take 650 mg by mouth 2 (two) times daily as needed for fever, headache or moderate pain.   albuterol 108 (90 Base) MCG/ACT inhaler Commonly known as: VENTOLIN HFA INHALE 2 PUFFS INTO THE LUNGS EVERY FOUR HOURS AS  NEEDED FOR WHEEZING OR SHORTNESS OF BREATH. What changed:  how much to take how to take this when to take this reasons to take this   atorvastatin 40 MG tablet Commonly known as: LIPITOR TAKE 1 TABLET (40 MG TOTAL) BY MOUTH DAILY. What changed: how much to take   budesonide-formoterol 160-4.5 MCG/ACT inhaler Commonly known as: SYMBICORT Inhale 2 puffs into the lungs 2 (two) times daily.   clopidogrel 75 MG tablet Commonly known as: PLAVIX Take 75 mg by mouth every evening.   diclofenac Sodium 1 % Gel Commonly known as: VOLTAREN Apply 1 Application topically 4 (four) times daily as needed (pain).   dicyclomine 20 MG tablet Commonly known as: BENTYL Take 1 tablet (20 mg total) by mouth 2 (two) times daily. What changed:  when to take this reasons to take this   Dulera 100-5 MCG/ACT Aero Generic drug: mometasone-formoterol INHALE 2 PUFFS INTO THE LUNGS TWO TIMES DAILY.   FLUoxetine 20 MG capsule Commonly known as: PROZAC Take 1 capsule (20 mg total) by mouth daily. What changed: how much to take   fluticasone 50 MCG/ACT nasal spray Commonly known as: FLONASE PLACE 2 SPRAYS INTO BOTH NOSTRILS DAILY. What changed:  when to take this reasons to take this   furosemide 20 MG tablet Commonly known as: LASIX Take  1 tablet (20 mg total) by mouth daily.   gabapentin 300 MG capsule Commonly known as: NEURONTIN Take 900 mg by mouth in the morning, at noon, and at bedtime. What changed: Another medication with the same name was removed. Continue taking this medication, and follow the directions you see here.   hydrOXYzine 50 MG tablet Commonly known as: ATARAX Take 50 mg by mouth every 6 (six) hours as needed for anxiety.   Insulin Degludec FlexTouch 100 UNIT/ML Sopn Inject 15 Units into the skin daily.   IRON PO Take 1 tablet by mouth daily.   loratadine 10 MG dissolvable tablet Commonly known as: CLARITIN REDITABS Take 10 mg by mouth daily.   melatonin 3 MG  Tabs tablet Take 2 tablets (6 mg total) by mouth at bedtime.   metFORMIN 500 MG tablet Commonly known as: GLUCOPHAGE Take 1 tablet (500 mg total) by mouth daily with breakfast.   methocarbamol 500 MG tablet Commonly known as: ROBAXIN Take 1 tablet (500 mg total) by mouth every 6 (six) hours as needed for muscle spasms.   montelukast 10 MG tablet Commonly known as: SINGULAIR TAKE 1 TABLET (10 MG TOTAL) BY MOUTH AT BEDTIME. What changed: how much to take   multivitamin with minerals Tabs tablet Take 1 tablet by mouth daily.   nicotine 14 mg/24hr patch Commonly known as: NICODERM CQ - dosed in mg/24 hours Place 1 patch (14 mg total) onto the skin daily.   OLANZapine 7.5 MG tablet Commonly known as: ZYPREXA Take 1 tablet (7.5 mg total) by mouth at bedtime.   omeprazole 20 MG capsule Commonly known as: PRILOSEC Take 1 capsule (20 mg total) by mouth daily.   pantoprazole 40 MG tablet Commonly known as: PROTONIX TAKE 1 TABLET (40 MG TOTAL) BY MOUTH DAILY. What changed: how much to take   REFRESH OP Place 1 drop into both eyes 2 (two) times daily as needed (dryness).   tiotropium 18 MCG inhalation capsule Commonly known as: SPIRIVA Place 18 mcg into inhaler and inhale daily.   traZODone 50 MG tablet Commonly known as: DESYREL Take 1 tablet (50 mg total) by mouth at bedtime. What changed:  when to take this reasons to take this   valsartan 40 MG tablet Commonly known as: DIOVAN Take 40 mg by mouth 2 (two) times daily.               Durable Medical Equipment  (From admission, onward)           Start     Ordered   04/19/23 1335  For home use only DME 4 wheeled rolling walker with seat  Once       Question:  Patient needs a walker to treat with the following condition  Answer:  Abnormal gait   04/19/23 1335            Follow-up Information     [facility] Follow up in 1 week(s).   Specialty:  Pulmonology Contact information: [address] [place] [address] [phone]               Allergies  Allergen Reactions   Nsaids Other (See Comments)     Kidney Disorder    The results of significant diagnostics from this hospitalization (including imaging, microbiology, ancillary and laboratory) are listed below for reference.    Microbiology: No results found for this or any previous visit (from the past 240 hour(s)).  Procedures/Studies: ECHOCARDIOGRAM COMPLETE  Result Date: 04/15/2023    ECHOCARDIOGRAM REPORT   Patient Name:   Cathy Hall [facility] Date of Exam: 04/15/2023 Medical Rec #:  [id number]                   Height:       61.0 in Accession #:    [id number]                  Weight:       268.0 lb Date of Birth:  08-Apr-1971                   BSA:          2.139 m Patient Age:    51 years                    BP:           107/72 mmHg Patient Gender: F                           HR:           105 bpm. Exam Location:  Inpatient Procedure: 2D Echo, Cardiac Doppler and Color Doppler Indications:    Dyspnea R06.00  History:        Patient has prior history of Echocardiogram examinations, most                 recent 11/06/2020. COPD, Signs/Symptoms:Dyspnea; Risk                 Factors:Hypertension.  Sonographer:    Eulah Pont RDCS Referring Phys: [id number] CHAND  Sonographer Comments: Patient is obese and suboptimal subcostal window. Image acquisition challenging due to uncooperative patient. pt sitting up and constantly moving throughout exam. IMPRESSIONS  1. Left ventricular ejection fraction, by estimation, is 60 to 65%. The left ventricle has normal function. The left ventricle has no regional wall motion abnormalities. There is mild concentric left ventricular hypertrophy. Left ventricular diastolic parameters are consistent with Grade I diastolic dysfunction (impaired relaxation).  2. Right ventricular systolic function is normal. The  right ventricular size is normal. Tricuspid regurgitation signal is inadequate for assessing PA pressure.  3. The mitral valve is grossly normal. Trivial mitral valve regurgitation.  4. The aortic valve is tricuspid. Aortic valve regurgitation is not visualized. No aortic stenosis is present. Comparison(s): No significant change from prior study. FINDINGS  Left Ventricle: Left ventricular ejection fraction, by estimation, is 60 to 65%. The left ventricle has normal function. The left ventricle has no regional wall motion abnormalities. The left ventricular internal cavity size was normal in size. There is  mild concentric left ventricular hypertrophy. Left ventricular diastolic parameters are consistent with Grade I diastolic dysfunction (impaired relaxation). Right Ventricle: The right ventricular size is normal. No increase in right ventricular wall thickness. Right ventricular systolic function is normal. Tricuspid regurgitation signal is inadequate for assessing PA pressure. Left Atrium: Left atrial size was normal in size. Right Atrium: Right atrial size was normal in size. Pericardium: There is no evidence of pericardial effusion. Mitral Valve: The mitral valve is grossly normal. Trivial mitral valve regurgitation. Tricuspid Valve: The tricuspid valve is normal in structure. Tricuspid valve regurgitation is trivial. Aortic Valve: The aortic valve is tricuspid. Aortic valve regurgitation is not visualized. No aortic stenosis is present. Pulmonic Valve: The pulmonic valve was normal in structure. Pulmonic valve regurgitation is trivial. Aorta:  The aortic root and ascending aorta are structurally normal, with no evidence of dilitation. IAS/Shunts: The atrial septum is grossly normal.  LEFT VENTRICLE PLAX 2D LVIDd:         3.70 cm     Diastology LVIDs:         2.40 cm     LV e' medial:    3.12 cm/s LV PW:         1.10 cm     LV E/e' medial:  22.5 LV IVS:        1.10 cm     LV e' lateral:   4.43 cm/s LVOT diam:      1.90 cm     LV E/e' lateral: 15.8 LV SV:         52 LV SV Index:   24 LVOT Area:     2.84 cm  LV Volumes (MOD) LV vol d, MOD A2C: 55.3 ml LV vol d, MOD A4C: 72.4 ml LV vol s, MOD A2C: 20.8 ml LV vol s, MOD A4C: 30.7 ml LV SV MOD A2C:     34.5 ml LV SV MOD A4C:     72.4 ml LV SV MOD BP:      37.1 ml RIGHT VENTRICLE RV S prime:     23.00 cm/s TAPSE (M-mode): 2.0 cm LEFT ATRIUM             Index        RIGHT ATRIUM           Index LA diam:        4.10 cm 1.92 cm/m   RA Area:     12.00 cm LA Vol (A2C):   29.3 ml 13.70 ml/m  RA Volume:   26.70 ml  12.48 ml/m LA Vol (A4C):   31.3 ml 14.63 ml/m LA Biplane Vol: 31.9 ml 14.91 ml/m  AORTIC VALVE LVOT Vmax:   127.00 cm/s LVOT Vmean:  80.300 cm/s LVOT VTI:    0.183 m  AORTA Ao Root diam: 3.10 cm Ao Asc diam:  3.20 cm MITRAL VALVE MV Area (PHT): 3.99 cm    SHUNTS MV Decel Time: 190 msec    Systemic VTI:  0.18 m MV E velocity: 70.20 cm/s  Systemic Diam: 1.90 cm MV A velocity: 73.40 cm/s MV E/A ratio:  0.96 Laurance Flatten MD Electronically signed by Laurance Flatten MD Signature Date/Time: 04/15/2023/4:38:25 PM    Final    DG Abd 1 View  Result Date: 04/15/2023 CLINICAL DATA:  Abdominal pain and distention. EXAM: ABDOMEN - 1 VIEW COMPARISON:  CT abdomen pelvis dated January 31, 2023. FINDINGS: The bowel gas pattern is normal. No radio-opaque calculi or other significant radiographic abnormality are seen. IUD noted. IMPRESSION: 1. Negative. Electronically Signed   By: Obie Dredge M.D.   On: 04/15/2023 13:59   CT Angio Chest PE W and/or Wo Contrast  Result Date: 04/11/2023 CLINICAL DATA:  Cough, congestion, hyperglycemia. History of COPD and asthma. Recently diagnosed with CHF. EXAM: CT ANGIOGRAPHY CHEST WITH CONTRAST TECHNIQUE: Multidetector CT imaging of the chest was performed using the standard protocol during bolus administration of intravenous contrast. Multiplanar CT image reconstructions and MIPs were obtained to evaluate the vascular anatomy. RADIATION  DOSE REDUCTION: This exam was performed according to the departmental dose-optimization program which includes automated exposure control, adjustment of the mA and/or kV according to patient size and/or use of iterative reconstruction technique. CONTRAST:  OMNIPAQUE IOHEXOL 350 MG/ML SOLN COMPARISON:  Chest radiograph 04/10/2023 and  CT chest 11/28/2020 FINDINGS: Cardiovascular: Satisfactory opacification of the pulmonary arteries to the segmental level. No evidence of pulmonary embolism. Heart size upper limits of normal. No pericardial effusion. Mediastinum/Nodes: No enlarged mediastinal, hilar, or axillary lymph nodes. Thyroid gland, trachea, and esophagus demonstrate no significant findings. Lungs/Pleura: Predominantly expiratory phase scan. Patchy bilateral ground-glass opacities greatest in the left upper lobe. Left lower lobe atelectasis. No pleural effusion or pneumothorax. Upper Abdomen: No acute abnormality. Musculoskeletal: No chest wall abnormality. No acute osseous findings. Review of the MIP images confirms the above findings. IMPRESSION: 1. No evidence of pulmonary embolism. 2. Patchy bilateral ground-glass opacities greatest in the left upper lobe, favor atypical infection versus edema. Electronically Signed   By: Minerva Fester M.D.   On: 04/11/2023 02:29   DG Chest 2 View  Result Date: 04/10/2023 CLINICAL DATA:  Shortness of breath EXAM: CHEST - 2 VIEW COMPARISON:  Chest radiograph dated 11/22/2021 FINDINGS: Normal lung volumes. Bibasilar patchy opacities. No pleural effusion or pneumothorax. The heart size and mediastinal contours are within normal limits. No acute osseous abnormality. IMPRESSION: Bibasilar patchy opacities, which may represent atelectasis or infection. Electronically Signed   By: Agustin Cree M.D.   On: 04/10/2023 21:31    Labs: BNP (last 3 results) Recent Labs    04/10/23 2112 04/16/23 0214  BNP 74.8 68.9   Basic Metabolic Panel: Recent Labs  Lab 04/15/23 0300  04/16/23 0214 04/17/23 0122 04/18/23 0612 04/19/23 0148 04/20/23 0852  NA 138 136 135 135 135 133*  K 5.1 4.9 3.9 3.4* 3.6 3.6  CL 92* 88* 91* 97* 99 102  CO2 40* 36* 32 27 24 21*  GLUCOSE 144* 192* 119* 114* 117* 222*  BUN 14 36* 40* 37* 38* 28*  CREATININE 0.93 1.27* 1.14* 0.91 1.23* 1.05*  CALCIUM 9.7 9.6 9.1 9.4 9.3 8.6*  MG 2.2 2.0 2.2 2.2 2.1  --    Recent Labs  Lab 04/15/23 0300 04/16/23 0214 04/17/23 0122 04/18/23 0612 04/19/23 0148  WBC 15.0* 10.5 13.1* 12.2* 13.1*  HGB 13.3 13.4 13.3 14.1 14.1  HCT 44.1 43.1 41.7 43.3 42.3  MCV 98.4 96.6 92.5 90.2 91.0  PLT 338 344 325 346 341   Recent Labs  Lab 04/19/23 2043 04/20/23 1207 04/20/23 1521 04/20/23 2109 04/21/23 0754  GLUCAP 194* 179* 197* 286* 197*      Component Value Date/Time   COLORURINE YELLOW 01/31/2023 2159   APPEARANCEUR HAZY (A) 01/31/2023 2159   LABSPEC 1.016 01/31/2023 2159   PHURINE 5.0 01/31/2023 2159   GLUCOSEU NEGATIVE 01/31/2023 2159   HGBUR NEGATIVE 01/31/2023 2159   BILIRUBINUR NEGATIVE 01/31/2023 2159   KETONESUR NEGATIVE 01/31/2023 2159   PROTEINUR NEGATIVE 01/31/2023 2159   UROBILINOGEN 0.2 08/07/2020 1139   NITRITE NEGATIVE 01/31/2023 2159   LEUKOCYTESUR SMALL (A) 01/31/2023 2159   Sepsis Labs Recent Labs  Lab 04/16/23 0214 04/17/23 0122 04/18/23 0612 04/19/23 0148  WBC 10.5 13.1* 12.2* 13.1*   Microbiology No results found for this or any previous visit (from the past 240 hour(s)).  Time coordinating discharge: 25 minutes  SIGNED: Lanae Boast, MD  [facility] 04/21/2023, 10:04 AM  If 7PM-7AM, please contact night-coverage [email or web address]

## 2023-04-19 NOTE — Progress Notes (Signed)
Physical Therapy Treatment Patient Details Name: Cathy Hall MRN: [id number] DOB: Dec 29, 1970 Today's Date: 04/19/2023   History of Present Illness 52 y.o. female adm 6/5 with medical history significant of morbid obesity with a BMI of 51, COPD/asthma, diabetes, diastolic dysfunction, hypertension, remote CVA and peripheral neuropathy.  Patient presented with cough congestion and elevated blood glucose.    PT Comments    Pt with improving mobility and now is supervision or modified independent for mobility. Dyspneic with activity and has to take rest breaks. Expect continued improvement in stamina. Pt eager to go home.  Recommendations for follow up therapy are one component of a multi-disciplinary discharge planning process, led by the attending physician.  Recommendations may be updated based on patient status, additional functional criteria and insurance authorization.  Follow Up Recommendations       Assistance Recommended at Discharge Intermittent Supervision/Assistance  Patient can return home with the following A little help with bathing/dressing/bathroom;Assistance with cooking/housework;Assist for transportation;Help with stairs or ramp for entrance   Equipment Recommendations  Rollator (4 wheels)    Recommendations for Other Services       Precautions / Restrictions Precautions Precautions: Fall Precaution Comments: Watch O2 Restrictions Weight Bearing Restrictions: No     Mobility  Bed Mobility Overal bed mobility: Modified Independent Bed Mobility: Supine to Sit, Sit to Supine     Supine to sit: Modified independent (Device/Increase time) Sit to supine: Modified independent (Device/Increase time)        Transfers Overall transfer level: Modified independent Equipment used: Rollator (4 wheels) Transfers: Sit to/from Stand Sit to Stand: Modified independent (Device/Increase time)                Ambulation/Gait Ambulation/Gait  assistance: Supervision Gait Distance (Feet): 40 Feet (x 2) Assistive device: Rollator (4 wheels) Gait Pattern/deviations: Step-through pattern, Decreased stride length, Wide base of support       General Gait Details: Steady gait with rollator.   Stairs             Wheelchair Mobility    Modified Rankin (Stroke Patients Only)       Balance Overall balance assessment: Needs assistance Sitting-balance support: No upper extremity supported, Feet supported Sitting balance-Leahy Scale: Good     Standing balance support: No upper extremity supported, During functional activity Standing balance-Leahy Scale: Fair                              Cognition Arousal/Alertness: Awake/alert Behavior During Therapy: WFL for tasks assessed/performed Overall Cognitive Status: Within Functional Limits for tasks assessed                                          Exercises      General Comments        Pertinent Vitals/Pain Pain Assessment Pain Assessment: No/denies pain    Home Living                          Prior Function            PT Goals (current goals can now be found in the care plan section) Progress towards PT goals: Progressing toward goals    Frequency    Min 1X/week      PT Plan Discharge plan needs to be updated;Frequency needs to be  updated    Co-evaluation              AM-PAC PT "6 Clicks" Mobility   Outcome Measure  Help needed turning from your back to your side while in a flat bed without using bedrails?: None Help needed moving from lying on your back to sitting on the side of a flat bed without using bedrails?: None Help needed moving to and from a bed to a chair (including a wheelchair)?: A Little Help needed standing up from a chair using your arms (e.g., wheelchair or bedside chair)?: A Little Help needed to walk in hospital room?: A Little Help needed climbing 3-5 steps with a railing? :  A Little 6 Click Score: 20    End of Session   Activity Tolerance: Patient tolerated treatment well Patient left: in bed;with call bell/phone within reach Nurse Communication: Mobility status PT Visit Diagnosis: Other abnormalities of gait and mobility (R26.89)     Time: 4098-1191 PT Time Calculation (min) (ACUTE ONLY): 17 min  Charges:  $Gait Training: 8-22 mins                     [facility] PT Acute Rehabilitation Services Office [phone]    Angelina Ok [facility] 04/19/2023, 1:53 PM

## 2023-04-19 NOTE — Progress Notes (Signed)
Occupational Therapy Treatment Patient Details Name: Cathy Hall MRN: [id number] DOB: 17-Aug-1971 Today's Date: 04/19/2023   History of present illness 52 y.o. female adm 6/5 with medical history significant of morbid obesity with a BMI of 51, COPD/asthma, diabetes, diastolic dysfunction, hypertension, remote CVA and peripheral neuropathy.  Patient presented with cough congestion and elevated blood glucose.   OT comments  Pt. Seen for skilled OT treatment session.  Able to compete lb dressing, standing grooming task and ambulation to/from b.room for toileting.  Aware of need for rest break and took one 1/2 way to b.room.  reviewed having chair available at home also at 1/2 way point to b.room if needed for rest.   reports good support/assistance available from her boyfriend and that he does all meals and cooking at home.  Eager for d/c home when able.  Agree with current d/c recommendations.     Recommendations for follow up therapy are one component of a multi-disciplinary discharge planning process, led by the attending physician.  Recommendations may be updated based on patient status, additional functional criteria and insurance authorization.    Assistance Recommended at Discharge Set up Supervision/Assistance  Patient can return home with the following  Assist for transportation;Assistance with cooking/housework;A little help with bathing/dressing/bathroom;A little help with walking and/or transfers   Equipment Recommendations  None recommended by OT    Recommendations for Other Services      Precautions / Restrictions Precautions Precautions: Fall Precaution Comments: Watch O2       Mobility Bed Mobility Overal bed mobility: Needs Assistance Bed Mobility: Supine to Sit     Supine to sit: Supervision          Transfers Overall transfer level: Needs assistance Equipment used: Rolling walker (2 wheels) Transfers: Sit to/from Stand, Bed to  chair/wheelchair/BSC Sit to Stand: Supervision           General transfer comment: utilized rw with 2 wheels that was available but pt. really liked the rollator/4 wheel rw that was used with her during previous session.  required seated rest break but did not communicate this prior to sitting in chair. educated on importance of letting those around her know if/when she needs a break so they can assist her safely.  also rec. chair at 1/2 way point at her house since break was needed today. she agreed.     Balance                                           ADL either performed or assessed with clinical judgement   ADL Overall ADL's : Needs assistance/impaired     Grooming: Wash/dry hands;Standing;Min guard               Lower Body Dressing: Set up;Bed level Lower Body Dressing Details (indicate cue type and reason): donned B socks in long sitting bending each leg semi figure 4 set up Toilet Transfer: Supervision/safety;Ambulation;Rolling walker (2 wheels) Toilet Transfer Details (indicate cue type and reason): required seated rest break from eob, around the bed seated at the sink, rested then able to amb. rest of the way to the b.room for toileting Toileting- Clothing Manipulation and Hygiene: Supervision/safety;Sitting/lateral lean       Functional mobility during ADLs: Min guard;Supervision/safety General ADL Comments: aware she needed rest break but did not verbalize, sat appropriately on a chair.  reviewed that she should communicate  with whoever is walking with her so they can assist if needed with making the sit easier and/or moving the chair faster to her if needed.  also discussed having chair at half way point at home also for any needed rest break, she agreed    [facility] Assessment              Vision       Perception     Praxis      Cognition Arousal/Alertness: Awake/alert Behavior During Therapy: WFL for tasks  assessed/performed Overall Cognitive Status: Within Functional Limits for tasks assessed                                          Exercises      Shoulder Instructions       General Comments      Pertinent Vitals/ Pain       Pain Assessment Pain Assessment: No/denies pain  Home Living                                          Prior Functioning/Environment              Frequency  Min 2X/week        Progress Toward Goals  OT Goals(current goals can now be found in the care plan section)  Progress towards OT goals: Progressing toward goals     Plan Discharge plan remains appropriate    Co-evaluation                 AM-PAC OT "6 Clicks" Daily Activity     Outcome Measure   Help from another person eating meals?: None Help from another person taking care of personal grooming?: None Help from another person toileting, which includes using toliet, bedpan, or urinal?: A Little Help from another person bathing (including washing, rinsing, drying)?: A Little Help from another person to put on and taking off regular upper body clothing?: None Help from another person to put on and taking off regular lower body clothing?: A Lot 6 Click Score: 20    End of Session Equipment Utilized During Treatment: Gait belt  OT Visit Diagnosis: Unsteadiness on feet (R26.81);Muscle weakness (generalized) (M62.81);Pain Pain - part of body: Knee;Hip   Activity Tolerance Patient tolerated treatment well   Patient Left in chair;with call bell/phone within reach   Nurse Communication          Time: 1478-2956 OT Time Calculation (min): 20 min  Charges: OT General Charges $OT Visit: 1 Visit OT Treatments $Self Care/Home Management : 8-22 mins  Boneta Lucks, COTA/L Acute Rehabilitation [phone]   Alessandra Bevels Lorraine-COTA/L 04/19/2023, 12:51 PM

## 2023-04-19 NOTE — Progress Notes (Signed)
Pt has home cpap.  

## 2023-04-20 LAB — BASIC METABOLIC PANEL
Anion gap: 10 (ref 5–15)
BUN: 28 mg/dL — ABNORMAL HIGH (ref 6–20)
CO2: 21 mmol/L — ABNORMAL LOW (ref 22–32)
Calcium: 8.6 mg/dL — ABNORMAL LOW (ref 8.9–10.3)
Chloride: 102 mmol/L (ref 98–111)
Creatinine, Ser: 1.05 mg/dL — ABNORMAL HIGH (ref 0.44–1.00)
GFR, Estimated: >60 mL/min (ref >60)
Glucose, Bld: 222 mg/dL — ABNORMAL HIGH (ref 70–99)
Potassium: 3.6 mmol/L (ref 3.5–5.1)
Sodium: 133 mmol/L — ABNORMAL LOW (ref 135–145)

## 2023-04-20 LAB — GLUCOSE, CAPILLARY
Glucose-Capillary: 179 mg/dL — ABNORMAL HIGH (ref 70–99)
Glucose-Capillary: 197 mg/dL — ABNORMAL HIGH (ref 70–99)
Glucose-Capillary: 286 mg/dL — ABNORMAL HIGH (ref 70–99)

## 2023-04-20 MED ORDER — INSULIN ASPART 100 UNIT/ML IJ SOLN: 0.0000 [IU] | Freq: Every day | INTRAMUSCULAR | Status: DC

## 2023-04-20 MED ORDER — INSULIN ASPART 100 UNIT/ML IJ SOLN
0.0000 [IU] | Freq: Three times a day (TID) | INTRAMUSCULAR | Status: DC
  Administered 2023-04-20 – 2023-04-21 (×2): 3 [IU] via SUBCUTANEOUS

## 2023-04-20 MED ORDER — INSULIN GLARGINE-YFGN 100 UNIT/ML ~~LOC~~ SOLN
15.0000 [IU] | Freq: Every day | SUBCUTANEOUS | Status: DC
  Administered 2023-04-20: 15 [IU] via SUBCUTANEOUS
  Filled 2023-04-20 (×2): qty 0.15

## 2023-04-20 NOTE — Progress Notes (Signed)
PROGRESS NOTE Cathy Hall  ZOX:[id number] DOB: May 15, 1971 DOA: 04/10/2023 PCP: Patient, No Pcp Per  Brief Narrative/Hospital Course: 51yof w/ diastolic CHF, polysubstance abuse, morbid obesity, COPD, CVA, HTN, DM 2 peripheral neuropathy presented to the ED with cough congestion elevated blood glucose.  He recently hospitalized for CHF exacerbation and epilepticus and 24 in [place] and discharged-but has not taken Lasix x 5 days and most of her medication due to lack of insurance In the ED hypertensive saturation hypoxic up to 87% when supine or when asleep, labs showed mildly elevated troponin viral PCR positive for parainfluenza hCG less than 5 chest x-ray bibasilar patchy opacities CT of the chest no PE but patchy bilateral groundglass opacities greatest in the left upper lobe favoring atypical infection versus edema. Patient is admitted and treated for acute respiratory distress due to COPD/asthma exacerbation acute on chronic hypoxic hypercapnic respiratory failure from parainfluenza pneumonia in the setting of smoking.  Seen by pulmonary, now needing BiPAP.  She had uncontrolled hyperglycemia in the setting of steroid use needing insulin     Subjective: Patient seen examined  Breathing well no distress no nausea no vomiting.  Has been using BiPAP.   Blood sugar now downtrending after stopping steroid   Assessment and Plan: Principal Problem:   Parainfluenza pneumonia Active Problems:   Acute on chronic diastolic CHF (congestive heart failure) (HCC)   Essential hypertension   Obesity, Class III, BMI 40-49.9 (morbid obesity) (HCC)   PVD (peripheral vascular disease) (HCC)   Chronic obstructive pulmonary disease (HCC)   Dyslipidemia (high LDL; low HDL)   Polysubstance abuse (HCC)   Left upper lobe pneumonia   COPD exacerbation (HCC)   Acute on chronic respiratory failure with hypoxia and hypercapnia (HCC)   Acute mild encephalopathy/drug secondary to CO2  narcosis: Resolved, alert awake oriented, continue nighttime BiPAP    Acute respiratory distress Parainfluenza pneumonia COPD/asthma exacerbation Acute on chronic hypoxic/hypercapnic respiratory failure  Everyday smoker: Respiratory status improved.  Doing well, continue Pulmicort, antitussive, DuoNeb. OFF Solu-Medrol and monitoing her CBG Continue Singulair, Dulera bronchodilators at home.  Follow-up with PCP and pulmonary pulmonary recommended  to continue BiPAP nightly 12/6 FiO2 40% and follow-up with pulmonary clinic for assessment of Bipap. She used BiPAP 6/13 night and did well.  BiPAP has been arranged by TOC.     Acute exacerbation of chronic diastolic CHF: Volume status improved, monitor fluid status. On dc cont po lasix.   Mild AKI Hyperkalemia: Labs improved.     Cerebrovascular disease Hypertension Hyperlipidemia: Stable cont her atorvasatatin, Plavix.  Continue home ARB.  BP stable, denies any chest pain   T2DM insulin-dependent, W/ uncontrolled hyperglycemia secondary to steroids Peripheral neuropathy: We are monitoring her blood sugar closely as insulin requirement decreasing after steroid discontinued. I am not very clear how much insulin she will need or if she will need insulin at all upon discharge , at home on Victoza and metformin-and reports it was generally well-controlled on it.  Her A1c is 8.2 on 04/11/23. Decreased Semglee to 15 units, stopping Premeal insulin, ssi changed to 0-15 u.She has Accu-Chek machine arranged already   Lab Results  Component Value Date   HGBA1C 8.2 (H) 04/11/2023   g-acting insulin at slightly reduced dose along with SSI, continue gabapentin.   Recent Labs  Lab 04/19/23 0837 04/19/23 1124 04/19/23 1704 04/19/23 2043 04/20/23 1207  GLUCAP 157* 163* 198* 194* 179*      Depression/anxiety: Continue home Prozac   Morbid obesity with BMI  50.6: would benefit from outpatient bariatric referral  DVT prophylaxis: enoxaparin  (LOVENOX) injection 40 mg Start: 04/11/23 0745 Code Status:   Code Status: Full Code Family Communication: plan of care discussed with patient at bedside. Patient status is: Inpatient because of respiratory failure Level of care: Progressive   Dispo: The patient is from: home            Anticipated disposition: home w/ [facility] tomorrow based upon her insulin need /blood glucose status Objective: Vitals last 24 hrs: Vitals:   04/19/23 2322 04/20/23 0327 04/20/23 0745 04/20/23 0747  BP:  127/79    Pulse: 99 83    Resp:  18    Temp:  98.5 F (36.9 C)    TempSrc:  Oral    SpO2: 99% 98% 100% 100%  Weight:      Height:       Weight change:   Physical Examination: General exam: AAox3, weak,older appearing HEENT:Oral mucosa moist, Ear/Nose WNL grossly, dentition normal. Respiratory system: bilaterally diminished BS, no use of accessory muscle Cardiovascular system: S1 & S2 +, regular rate,. Gastrointestinal system: Abdomen soft, NT,ND,BS+ Nervous System:Alert, awake, moving extremities and grossly nonfocal Extremities: LE ankle edema neg, lower extremities warm Skin: No rashes,no icterus. MSK: Normal muscle bulk,tone, power   Medications reviewed:  Scheduled Meds:  atorvastatin  40 mg Oral Daily   budesonide (PULMICORT) nebulizer solution  0.25 mg Nebulization BID   clopidogrel  75 mg Oral Daily   enoxaparin (LOVENOX) injection  40 mg Subcutaneous Q24H   feeding supplement  237 mL Oral BID BM   FLUoxetine  40 mg Oral Daily   guaiFENesin  600 mg Oral BID   insulin aspart  0-15 Units Subcutaneous TID WC   insulin aspart  0-5 Units Subcutaneous QHS   insulin aspart  0-5 Units Subcutaneous QHS   insulin glargine-yfgn  15 Units Subcutaneous Q2200   ipratropium-albuterol  3 mL Nebulization BID   irbesartan  75 mg Oral Daily   mometasone-formoterol  2 puff Inhalation BID   montelukast  10 mg Oral QHS   pantoprazole  40 mg Oral Daily   sodium chloride flush  3 mL Intravenous Q12H    Continuous Infusions:    Diet Order             Diet heart healthy/carb modified Room service appropriate? Yes; Fluid consistency: Thin  Diet effective now                  Intake/Output Summary (Last 24 hours) at 04/20/2023 1423 Last data filed at 04/20/2023 0100 Gross per 24 hour  Intake 534 ml  Output --  Net 534 ml   Net IO Since Admission: -7,990.17 mL [04/20/23 1423]  Wt Readings from Last 3 Encounters:  04/12/23 121.6 kg  01/31/23 112 kg  11/26/21 114.2 kg     Unresulted Labs (From admission, onward)     Start     Ordered   04/18/23 0500  Creatinine, serum  (enoxaparin (LOVENOX)    CrCl >/= 30 ml/min)  Weekly,   R     Comments: while on enoxaparin therapy    04/11/23 0742          Data Reviewed: I have personally reviewed following labs and imaging studies CBC: Recent Labs  Lab 04/15/23 0300 04/16/23 0214 04/17/23 0122 04/18/23 0612 04/19/23 0148  WBC 15.0* 10.5 13.1* 12.2* 13.1*  HGB 13.3 13.4 13.3 14.1 14.1  HCT 44.1 43.1 41.7 43.3 42.3  MCV 98.4  96.6 92.5 90.2 91.0  PLT 338 344 325 346 341   Basic Metabolic Panel: Recent Labs  Lab 04/15/23 0300 04/16/23 0214 04/17/23 0122 04/18/23 0612 04/19/23 0148 04/20/23 0852  NA 138 136 135 135 135 133*  K 5.1 4.9 3.9 3.4* 3.6 3.6  CL 92* 88* 91* 97* 99 102  CO2 40* 36* 32 27 24 21*  GLUCOSE 144* 192* 119* 114* 117* 222*  BUN 14 36* 40* 37* 38* 28*  CREATININE 0.93 1.27* 1.14* 0.91 1.23* 1.05*  CALCIUM 9.7 9.6 9.1 9.4 9.3 8.6*  MG 2.2 2.0 2.2 2.2 2.1  --   CBG: Recent Labs  Lab 04/19/23 0837 04/19/23 1124 04/19/23 1704 04/19/23 2043 04/20/23 1207  GLUCAP 157* 163* 198* 194* 179*   No results for input(s): "PROCALCITON", "LATICACIDVEN" in the last 168 hours.   Recent Results (from the past 240 hour(s))  Resp panel by RT-PCR (RSV, Flu A&B, Covid) Anterior Nasal Swab     Status: None   Collection Time: 04/10/23  9:06 PM   Specimen: Anterior Nasal Swab  Result Value Ref Range  Status   SARS Coronavirus 2 by RT PCR NEGATIVE NEGATIVE Final   Influenza A by PCR NEGATIVE NEGATIVE Final   Influenza B by PCR NEGATIVE NEGATIVE Final    Comment: (NOTE) The Xpert Xpress SARS-CoV-2/FLU/RSV plus assay is intended as an aid in the diagnosis of influenza from Nasopharyngeal swab specimens and should not be used as a sole basis for treatment. Nasal washings and aspirates are unacceptable for Xpert Xpress SARS-CoV-2/FLU/RSV testing.  Fact Sheet for Patients: BloggerCourse.com  Fact Sheet for Healthcare Providers: SeriousBroker.it  This test is not yet approved or cleared by the [place] FDA and has been authorized for detection and/or diagnosis of SARS-CoV-2 by FDA under an Emergency Use Authorization (EUA). This EUA will remain in effect (meaning this test can be used) for the duration of the COVID-19 declaration under Section 564(b)(1) of the Act, 21 U.S.C. section 360bbb-3(b)(1), unless the authorization is terminated or revoked.     Resp Syncytial Virus by PCR NEGATIVE NEGATIVE Final    Comment: (NOTE) Fact Sheet for Patients: BloggerCourse.com  Fact Sheet for Healthcare Providers: SeriousBroker.it  This test is not yet approved or cleared by the [place] FDA and has been authorized for detection and/or diagnosis of SARS-CoV-2 by FDA under an Emergency Use Authorization (EUA). This EUA will remain in effect (meaning this test can be used) for the duration of the COVID-19 declaration under Section 564(b)(1) of the Act, 21 U.S.C. section 360bbb-3(b)(1), unless the authorization is terminated or revoked.  Performed at [facility], [address], [place], [place] [address]   Respiratory (~20 pathogens) panel by PCR     Status: Abnormal   Collection Time: 04/10/23  9:06 PM   Specimen: Nasopharyngeal Swab; Respiratory  Result Value Ref Range  Status   Adenovirus NOT DETECTED NOT DETECTED Final   Coronavirus 229E NOT DETECTED NOT DETECTED Final    Comment: (NOTE) The Coronavirus on the Respiratory Panel, DOES NOT test for the novel  Coronavirus (2019 nCoV)    Coronavirus HKU1 NOT DETECTED NOT DETECTED Final   Coronavirus NL63 NOT DETECTED NOT DETECTED Final   Coronavirus OC43 NOT DETECTED NOT DETECTED Final   Metapneumovirus NOT DETECTED NOT DETECTED Final   Rhinovirus / Enterovirus NOT DETECTED NOT DETECTED Final   Influenza A NOT DETECTED NOT DETECTED Final   Influenza B NOT DETECTED NOT DETECTED Final   Parainfluenza Virus 1  NOT DETECTED NOT DETECTED Final   Parainfluenza Virus 2 NOT DETECTED NOT DETECTED Final   Parainfluenza Virus 3 DETECTED (A) NOT DETECTED Final   Parainfluenza Virus 4 NOT DETECTED NOT DETECTED Final   Respiratory Syncytial Virus NOT DETECTED NOT DETECTED Final   Bordetella pertussis NOT DETECTED NOT DETECTED Final   Bordetella Parapertussis NOT DETECTED NOT DETECTED Final   Chlamydophila pneumoniae NOT DETECTED NOT DETECTED Final   Mycoplasma pneumoniae NOT DETECTED NOT DETECTED Final    Comment: Performed at [facility], [address] [place], [place] [address]    Antimicrobials: Anti-infectives (From admission, onward)    Start     Dose/Rate Route Frequency Ordered Stop   04/12/23 0200  cefTRIAXone (ROCEPHIN) 1 g in sodium chloride 0.9 % 100 mL IVPB  Status:  Discontinued        1 g 200 mL/hr over 30 Minutes Intravenous Every 24 hours 04/11/23 0732 04/12/23 0812   04/12/23 0200  azithromycin (ZITHROMAX) 500 mg in sodium chloride 0.9 % 250 mL IVPB  Status:  Discontinued        500 mg 250 mL/hr over 60 Minutes Intravenous Every 24 hours 04/11/23 0732 04/12/23 0812   04/11/23 0115  cefTRIAXone (ROCEPHIN) 1 g in sodium chloride 0.9 % 100 mL IVPB        1 g 200 mL/hr over 30 Minutes Intravenous  Once 04/11/23 0103 04/11/23 0215   04/11/23 0115  azithromycin (ZITHROMAX) 500 mg in sodium  chloride 0.9 % 250 mL IVPB        500 mg 250 mL/hr over 60 Minutes Intravenous  Once 04/11/23 0103 04/11/23 0225   04/11/23 0000  azithromycin (ZITHROMAX) 250 MG tablet        250 mg Oral Daily 04/11/23 0252     04/11/23 0000  amoxicillin-clavulanate (AUGMENTIN) 875-125 MG tablet        1 tablet Oral Every 12 hours 04/11/23 0252        Culture/Microbiology    Component Value Date/Time   SDES  11/24/2021 1750    URINE, CLEAN CATCH Performed at [facility], [address], [place], [place] [address]    SPECREQUEST  11/24/2021 1750    NONE Performed at [facility], [address] [place], [place] [address]    CULT MULTIPLE SPECIES PRESENT, SUGGEST RECOLLECTION (A) 11/24/2021 1750   REPTSTATUS 11/27/2021 FINAL 11/24/2021 1750   Radiology Studies: No results found.   LOS: 6 days   Lanae Boast, MD [facility]  04/20/2023, 2:23 PM

## 2023-04-20 NOTE — Plan of Care (Signed)

## 2023-04-21 LAB — GLUCOSE, CAPILLARY
Glucose-Capillary: 197 mg/dL — ABNORMAL HIGH (ref 70–99)
Glucose-Capillary: 151 mg/dL — ABNORMAL HIGH (ref 70–99)

## 2023-04-21 MED ORDER — INSULIN DEGLUDEC FLEXTOUCH 100 UNIT/ML ~~LOC~~ SOPN: 15.0000 [IU] | PEN_INJECTOR | Freq: Every day | SUBCUTANEOUS | 0 refills | Status: DC

## 2023-04-21 NOTE — Plan of Care (Signed)
  Problem: Coping: Goal: Ability to adjust to condition or change in health will improve Outcome: Adequate for Discharge   

## 2023-04-21 NOTE — Progress Notes (Signed)
   04/21/23 0049  BiPAP/CPAP/SIPAP  $ Non-Invasive Home Ventilator  Subsequent  BiPAP/CPAP/SIPAP Pt Type Adult  BiPAP/CPAP/SIPAP Resmed  Mask Type Full face mask  Mask Size Medium  Respiratory Rate 15 breaths/min  EPAP 7 cmH2O  FiO2 (%) 21 %  Patient Home Equipment Yes  Safety Check Completed by RT for Home Unit Yes, no issues noted  BiPAP/CPAP /SiPAP Vitals  Pulse Rate 78  SpO2 97 %  Bilateral Breath Sounds Diminished  MEWS Score/Color  MEWS Score 0  MEWS Score Color Cathy Hall

## 2023-04-21 NOTE — TOC Progression Note (Signed)
Transition of Care [facility]) - Progression Note   Patient Details  Name: Cathy Hall MRN: [id number] Date of Birth: August 16, 1971  [facility] CM/SW Contact  Helene Kelp, [place] Phone Number: 04/21/2023, 1:10 PM  Clinical Narrative:    CSW was updated by the bedside nurse the patient is in need of DME (rollaider) and medication support her discharge disposition.  This wrier met with the patient at the bedside and reviewed the update/request. The patient noted she does not have medical coverage/insurance currently.The patient reported she has filled and submitted for Medicaid about week ago from today.   This noted the clinical team will be updated to the request to support the patient's discharge today.    Expected Discharge Plan: Home w Home Health Services Barriers to Discharge: Barriers Resolved  Expected Discharge Plan and Services In-house Referral: Clinical Social Work Discharge Planning Services: CM Consult Post Acute Care Choice: NA Living arrangements for the past 2 months: Apartment Expected Discharge Date: 04/21/23               DME Arranged: Dan Humphreys rolling with seat DME Agency: [employer] Date DME Agency Contacted: 04/17/23 Time DME Agency Contacted: 1452 Representative spoke with at DME Agency: Marthann Schiller HH Arranged: PT, OT HH Agency: [facility] Date [facility] Agency Contacted: 04/17/23 Time HH Agency Contacted: 1452 Representative spoke with at [facility] Agency: Rivka Barbara   Social Determinants of Health (SDOH) Interventions SDOH Screenings   Food Insecurity: Food Insecurity Present (04/11/2023)  Housing: Medium Risk (04/11/2023)  Transportation Needs: Unmet Transportation Needs (04/11/2023)  Utilities: Not At Risk (04/11/2023)  Alcohol Screen: Low Risk  (11/21/2021)  Depression (PHQ2-9): Medium Risk (08/07/2020)  Tobacco Use: High Risk (04/10/2023)    Readmission Risk Interventions    04/21/2023   11:39 AM 11/16/2020   11:53 AM  Readmission Risk  Prevention Plan  Transportation Screening Complete Complete  PCP or Specialist Appt within 3-5 Days  Complete  HRI or Home Care Consult  Complete  Social Work Consult for Recovery Care Planning/Counseling  Complete  Palliative Care Screening  Complete  Medication Review Oceanographer) Complete Complete  HRI or Home Care Consult Complete   SW Recovery Care/Counseling Consult Complete   Palliative Care Screening Not Applicable

## 2023-04-21 NOTE — TOC Transition Note (Signed)
Transition of Care (TOC) - CM/SW Discharge Note Donn Pierini RN, BSN Transitions of Care Unit 4E- RN Case Manager See Treatment Team for direct phone # Weekend Coverage  Patient Details  Name: Cathy Hall MRN: [id number] Date of Birth: [birth year]  Transition of Care [facility]) CM/SW Contact:  Darrold Span, RN Phone Number: 04/21/2023, 1:18 PM   Clinical Narrative:    Pt stable for transition home today,   Per previous CM note- pt has been set up with [employer] for home via [employer]- loaner.  HHPT/OT has been set up with [facility] under [facility]- they will contact pt post discharge to schedule.   CM notified this am that pt needed rollator for home- CM contacted [employer] and Rollator to be provided under LOG - [employer] will deliver to room prior to discharge  Pt needs medication assistance- is eligible for MATCH- meds have been sent to [facility]- CM provided pt with letter for [facility] assistance and for her to take with her to [employer].    Final next level of care: Home w Home Health Services Barriers to Discharge: Barriers Resolved   Patient Goals and CMS Choice CMS Medicare.gov Compare Post Acute Care list provided to:: Patient Choice offered to / list presented to : NA  Discharge Placement                 Home w/ [facility]        Discharge Plan and Services Additional resources added to the After Visit Summary for   In-house Referral: Clinical Social Work Discharge Planning Services: CM Consult Post Acute Care Choice: NA          DME Arranged: Dan Humphreys rolling with seat DME Agency: [employer] Date DME Agency Contacted: 04/17/23 Time DME Agency Contacted: 1452 Representative spoke with at DME Agency: Mitch HH Arranged: PT, OT HH Agency: [facility] Date [facility] Agency Contacted: 04/17/23 Time HH Agency Contacted: 1452 Representative spoke with at [facility] Agency: Rivka Barbara  Social Determinants of Health (SDOH) Interventions SDOH Screenings   Food  Insecurity: Food Insecurity Present (04/11/2023)  Housing: Medium Risk (04/11/2023)  Transportation Needs: Unmet Transportation Needs (04/11/2023)  Utilities: Not At Risk (04/11/2023)  Alcohol Screen: Low Risk  (11/21/2021)  Depression (PHQ2-9): Medium Risk (08/07/2020)  Tobacco Use: High Risk (04/10/2023)     Readmission Risk Interventions    04/21/2023   11:39 AM 11/16/2020   11:53 AM  Readmission Risk Prevention Plan  Transportation Screening Complete Complete  PCP or Specialist Appt within 3-5 Days  Complete  HRI or Home Care Consult  Complete  Social Work Consult for Recovery Care Planning/Counseling  Complete  Palliative Care Screening  Complete  Medication Review Oceanographer) Complete Complete  HRI or Home Care Consult Complete   SW Recovery Care/Counseling Consult Complete   Palliative Care Screening Not Applicable

## 2023-04-25 ENCOUNTER — Encounter: Payer: Self-pay | Admitting: Internal Medicine

## 2023-04-25 ENCOUNTER — Other Ambulatory Visit (HOSPITAL_COMMUNITY): Payer: Self-pay

## 2023-04-25 ENCOUNTER — Ambulatory Visit (INDEPENDENT_AMBULATORY_CARE_PROVIDER_SITE_OTHER): Payer: Medicaid Other | Admitting: Internal Medicine

## 2023-04-25 VITALS — BP 127/85 | HR 102 | Temp 98.0°F | Ht 61.0 in | Wt 280.3 lb

## 2023-04-25 DIAGNOSIS — I110 Hypertensive heart disease with heart failure: Secondary | ICD-10-CM

## 2023-04-25 DIAGNOSIS — J42 Unspecified chronic bronchitis: Secondary | ICD-10-CM

## 2023-04-25 DIAGNOSIS — F333 Major depressive disorder, recurrent, severe with psychotic symptoms: Secondary | ICD-10-CM

## 2023-04-25 DIAGNOSIS — I5033 Acute on chronic diastolic (congestive) heart failure: Secondary | ICD-10-CM | POA: Diagnosis not present

## 2023-04-25 DIAGNOSIS — G8929 Other chronic pain: Secondary | ICD-10-CM | POA: Diagnosis not present

## 2023-04-25 DIAGNOSIS — E1151 Type 2 diabetes mellitus with diabetic peripheral angiopathy without gangrene: Secondary | ICD-10-CM

## 2023-04-25 DIAGNOSIS — F17210 Nicotine dependence, cigarettes, uncomplicated: Secondary | ICD-10-CM

## 2023-04-25 DIAGNOSIS — I10 Essential (primary) hypertension: Secondary | ICD-10-CM

## 2023-04-25 DIAGNOSIS — F1910 Other psychoactive substance abuse, uncomplicated: Secondary | ICD-10-CM

## 2023-04-25 DIAGNOSIS — E1169 Type 2 diabetes mellitus with other specified complication: Secondary | ICD-10-CM

## 2023-04-25 DIAGNOSIS — I739 Peripheral vascular disease, unspecified: Secondary | ICD-10-CM

## 2023-04-25 DIAGNOSIS — M25561 Pain in right knee: Secondary | ICD-10-CM

## 2023-04-25 MED ORDER — FUROSEMIDE 20 MG PO TABS
40.00 mg | ORAL_TABLET | Freq: Every day | ORAL | 0 refills | Status: DC
Start: 2023-04-25 — End: 2023-05-13
  Filled 2023-04-25: qty 60, 30d supply, fill #0

## 2023-04-25 MED ORDER — LIRAGLUTIDE 18 MG/3ML ~~LOC~~ SOPN
0.6000 mg | PEN_INJECTOR | Freq: Every day | SUBCUTANEOUS | 3 refills | Status: DC
Start: 2023-04-25 — End: 2023-07-10
  Filled 2023-04-25: qty 3, 30d supply, fill #0
  Filled 2023-06-03 – 2023-07-01 (×2): qty 3, 30d supply, fill #1

## 2023-04-25 NOTE — Patient Instructions (Addendum)
Thank you, [name] for allowing [place] to provide your care today.   Diabetes Please bring in glucometer that you are using at follow-up in 2 weeks. Continue using lantus 15 units at night. Restart victoza, this is a daily injection. I sent this to [facility]. This should be $4 monthly.  Heart failure Please drink less than 2L daily and eat less than 2 g of salt. Increase lasix to 40 mg daily, you can take 2 tablets of 20 mg.  COPD Please use inhalers daily. There are inhalers with IM program that would be $8 monthly.    I have ordered the following medication/changed the following medications:   Stop the following medications: Medications Discontinued During This Encounter  Medication Reason   budesonide-formoterol (SYMBICORT) 160-4.5 MCG/ACT inhaler    dicyclomine (BENTYL) 20 MG tablet    metFORMIN (GLUCOPHAGE) 500 MG tablet    furosemide (LASIX) 20 MG tablet Reorder     Start the following medications: Meds ordered this encounter  Medications   liraglutide (VICTOZA) 18 MG/3ML SOPN    Sig: Inject 0.6 mg into the skin daily.    Dispense:  3 mL    Refill:  3    IM program   furosemide (LASIX) 20 MG tablet    Sig: Take 2 tablets (40 mg total) by mouth daily.    Dispense:  60 tablet    Refill:  0     Follow up:  2 weeks    We look forward to seeing you next time. Please call our clinic at [phone] if you have any questions or concerns. The best time to call is Monday-Friday from 9am-4pm, but there is someone available 24/7. If after hours or the weekend, call the main hospital number and ask for the Internal Medicine Resident On-Call. If you need medication refills, please notify your pharmacy one week in advance and they will send [place] a request.   Thank you for trusting me with your care. Wishing you the best!   Rudene Christians, DO [facility]
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
## 2023-04-26 ENCOUNTER — Encounter: Payer: Self-pay | Admitting: Internal Medicine

## 2023-04-26 DIAGNOSIS — E118 Type 2 diabetes mellitus with unspecified complications: Secondary | ICD-10-CM | POA: Insufficient documentation

## 2023-04-26 DIAGNOSIS — E119 Type 2 diabetes mellitus without complications: Secondary | ICD-10-CM | POA: Insufficient documentation

## 2023-04-26 DIAGNOSIS — Z794 Long term (current) use of insulin: Secondary | ICD-10-CM | POA: Insufficient documentation

## 2023-04-26 NOTE — Assessment & Plan Note (Signed)
Current smoker with no prior formal lung studies. She was admitted for COPD exacerbation in setting of parainfluenza pneumonia. Pulmonology consulted during admission and she was discharged with BiPAP. She has been using this since being home. She does not using inhalers regularly as she is worried about running out.   P: Ensure follow-up with pulmonology Revisit smoking cessation at follow-up Continue mometasone-formoterol inhaler Continue tiotropium inhaler Could consider formal PFT as reported history of Asthma

## 2023-04-26 NOTE — Assessment & Plan Note (Signed)
Unable to address at initial visit. Pain is chronic, will address at follow-up.  MRI from 2022 showed posterior meniscal tear that is non-displaced and degeneration of ACL.

## 2023-04-26 NOTE — Assessment & Plan Note (Signed)
Prior to recent admission she was taking victoza and metformin. She was admitted for COPD exacerbation 2/2 to CAP and blood sugar increased due to steroid treatment. She was discharged with long acting insulin. She did not restart metformin at discharge as she felt that bowel movements were better since stopping medication. She is interested in restarting victoza. Lab Results  Component Value Date   HGBA1C 8.2 (H) 04/11/2023    Her A1c was elevated to 8.2 during admission. She has been checking blood sugar 3 times daily but did not bring in glucometer to visit. She is unsure what averages are.  P: Degludec 15 units at bedtime Bring glucometer in at follow-up Restart victoza 0.6 mg, could increase dose at follow-up if tolerating well. Consider addition of SGLT-2 as would also help with HFpEF

## 2023-04-26 NOTE — Assessment & Plan Note (Addendum)
Medications include valsartan 40 mg BID. BP at goal at 127/85. She is unsure about what medications she takes at home and has difficulty affording them. When she was recently discharged she was able to get medications with one-time MATCH program.  P: Patient encouraged to bring in all medications at follow-up. Goal will be to cut down on medications to promote adherence with financial barriers.

## 2023-04-26 NOTE — Assessment & Plan Note (Signed)
ABI from 2022 showed mild PAD bilaterally.  P: Continue atorvastatin 40 mg, repeat Lipid at follow-up. LDL goal <70 with secondary prevention. Continue clopidogrel 75 mg

## 2023-04-26 NOTE — Assessment & Plan Note (Signed)
She smokes 5-6 cigarettes daily, down from a pack. She is not interested in cessation at this time.

## 2023-04-26 NOTE — Assessment & Plan Note (Signed)
Previously used cocaine. Last use was 2 years ago when her son and twin died.

## 2023-04-26 NOTE — Assessment & Plan Note (Signed)
Complete GAD and PHQ-9 at follow-up

## 2023-04-26 NOTE — Assessment & Plan Note (Signed)
She was admitted at hospital in [place] in May due to HFpEF exacerbation. At that discharge lasix was continued as new medication. During more recent admission she was given IV lasix and discharged with PO lasix 20 mg every day. She reports adherence since being home. She drinks at least 3L of fluid daily and is unsure about salt intake. Her breathing feels about the same as at discharge. She has not been able to lay flat to sleep in several months and is limited due to right knee pain with ambulation. Her weight is up by about 11 lbs since discharge from hospital. She appears euvolemic on exam, however body habitus makes this difficult to tell. P: Increase lasix from 20 to 40 mg every day Follow-up in 2 weeks Check BMP at that time Could consider starting SGLT2 in future however high concern for polypharmacy and ability to afford medications.

## 2023-04-29 NOTE — Progress Notes (Signed)
Internal Medicine Clinic Attending  Case discussed with Dr. Masters  At the time of the visit.  We reviewed the resident's history and exam and pertinent patient test results.  I agree with the assessment, diagnosis, and plan of care documented in the resident's note.  

## 2023-05-13 ENCOUNTER — Encounter: Payer: Self-pay | Admitting: Internal Medicine

## 2023-05-13 ENCOUNTER — Other Ambulatory Visit: Payer: Self-pay

## 2023-05-13 ENCOUNTER — Ambulatory Visit (INDEPENDENT_AMBULATORY_CARE_PROVIDER_SITE_OTHER): Payer: No Typology Code available for payment source | Admitting: Internal Medicine

## 2023-05-13 VITALS — BP 197/129 | HR 96 | Temp 97.7°F | Ht 61.0 in | Wt 283.0 lb

## 2023-05-13 DIAGNOSIS — Z1231 Encounter for screening mammogram for malignant neoplasm of breast: Secondary | ICD-10-CM

## 2023-05-13 DIAGNOSIS — I10 Essential (primary) hypertension: Secondary | ICD-10-CM

## 2023-05-13 DIAGNOSIS — M25561 Pain in right knee: Secondary | ICD-10-CM | POA: Diagnosis not present

## 2023-05-13 DIAGNOSIS — E1151 Type 2 diabetes mellitus with diabetic peripheral angiopathy without gangrene: Secondary | ICD-10-CM | POA: Diagnosis not present

## 2023-05-13 DIAGNOSIS — I110 Hypertensive heart disease with heart failure: Secondary | ICD-10-CM

## 2023-05-13 DIAGNOSIS — Z8673 Personal history of transient ischemic attack (TIA), and cerebral infarction without residual deficits: Secondary | ICD-10-CM

## 2023-05-13 DIAGNOSIS — F17210 Nicotine dependence, cigarettes, uncomplicated: Secondary | ICD-10-CM

## 2023-05-13 DIAGNOSIS — G8929 Other chronic pain: Secondary | ICD-10-CM

## 2023-05-13 DIAGNOSIS — I739 Peripheral vascular disease, unspecified: Secondary | ICD-10-CM

## 2023-05-13 DIAGNOSIS — I5033 Acute on chronic diastolic (congestive) heart failure: Secondary | ICD-10-CM

## 2023-05-13 DIAGNOSIS — E662 Morbid (severe) obesity with alveolar hypoventilation: Secondary | ICD-10-CM

## 2023-05-13 DIAGNOSIS — E119 Type 2 diabetes mellitus without complications: Secondary | ICD-10-CM

## 2023-05-13 MED ORDER — EMPAGLIFLOZIN 10 MG PO TABS
10.0000 mg | ORAL_TABLET | Freq: Every day | ORAL | 3 refills | Status: DC
Start: 2023-05-13 — End: 2023-05-23
  Filled 2023-05-13: qty 30, 30d supply, fill #0

## 2023-05-13 MED ORDER — FUROSEMIDE 40 MG PO TABS
40.0000 mg | ORAL_TABLET | Freq: Every day | ORAL | 11 refills | Status: DC
Start: 2023-05-13 — End: 2023-05-23
  Filled 2023-05-13: qty 30, 30d supply, fill #0

## 2023-05-13 MED ORDER — AMLODIPINE BESYLATE 10 MG PO TABS
10.0000 mg | ORAL_TABLET | Freq: Every day | ORAL | 11 refills | Status: DC
Start: 2023-05-13 — End: 2023-05-13
  Filled 2023-05-13: qty 30, 30d supply, fill #0

## 2023-05-13 MED ORDER — ASPIRIN 81 MG PO TBEC
81.0000 mg | DELAYED_RELEASE_TABLET | Freq: Every day | ORAL | 3 refills | Status: DC
Start: 2023-05-13 — End: 2023-05-13
  Filled 2023-05-13: qty 90, 90d supply, fill #0

## 2023-05-13 MED ORDER — OLMESARTAN MEDOXOMIL 20 MG PO TABS
20.0000 mg | ORAL_TABLET | Freq: Every day | ORAL | 3 refills | Status: DC
Start: 2023-05-13 — End: 2023-05-23
  Filled 2023-05-13: qty 90, 90d supply, fill #0

## 2023-05-13 MED ORDER — ASPIRIN 81 MG PO TBEC
81.0000 mg | DELAYED_RELEASE_TABLET | Freq: Every day | ORAL | 3 refills | Status: DC
Start: 2023-05-13 — End: 2023-11-11
  Filled 2023-05-13 – 2023-06-03 (×2): qty 90, 90d supply, fill #0
  Filled 2023-08-30: qty 90, 90d supply, fill #1

## 2023-05-13 MED ORDER — OLMESARTAN MEDOXOMIL 20 MG PO TABS
20.0000 mg | ORAL_TABLET | Freq: Every day | ORAL | 11 refills | Status: DC
Start: 2023-05-13 — End: 2023-05-13
  Filled 2023-05-13: qty 30, 30d supply, fill #0

## 2023-05-13 MED ORDER — FUROSEMIDE 40 MG PO TABS
40.0000 mg | ORAL_TABLET | Freq: Every day | ORAL | 11 refills | Status: DC
Start: 2023-05-13 — End: 2023-05-13
  Filled 2023-05-13: qty 30, 30d supply, fill #0

## 2023-05-13 MED ORDER — EMPAGLIFLOZIN 10 MG PO TABS
10.0000 mg | ORAL_TABLET | Freq: Every day | ORAL | 3 refills | Status: DC
Start: 2023-05-13 — End: 2023-05-13
  Filled 2023-05-13: qty 30, 30d supply, fill #0

## 2023-05-13 MED ORDER — AMLODIPINE BESYLATE 10 MG PO TABS
10.0000 mg | ORAL_TABLET | Freq: Every day | ORAL | 11 refills | Status: DC
Start: 2023-05-13 — End: 2023-06-06
  Filled 2023-05-13 – 2023-06-03 (×2): qty 30, 30d supply, fill #0
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
## 2023-05-13 NOTE — Patient Instructions (Addendum)
Thank you, Cathy Hall for allowing [place] to provide your care today.   I am concerned that you are getting more fluids on you. Please take lasix 40 mg daily. I sent in lasix at higher dose or you can take 2 tablets of 20 mg to equal 40 mg. Make sure you are drinking less than 67 oz daily or 2L. Follow-up next week. If you start having worsening of shortness of breath than please go to ED for evaluation. I am checking some blood work and will follow-up about that  Diabetes I am starting another medication for diabetes called empagliflozin. Continue taking metformin and victoza.  Blood pressure I am starting another medication called olmesartan 20 mg and amlodipine 10 mg. Take thest daily  I placed referral for mammogram  And order for MRI.  I have ordered the following labs for you:  Lab Orders         BMP8+Anion Gap         Microalbumin / Creatinine Urine Ratio     Referrals ordered today:   Referral Orders  No referral(s) requested today     I have ordered the following medication/changed the following medications:   Stop the following medications: Medications Discontinued During This Encounter  Medication Reason   valsartan (DIOVAN) 40 MG tablet    furosemide (LASIX) 20 MG tablet      Start the following medications: Meds ordered this encounter  Medications   aspirin EC 81 MG tablet    Sig: Take 1 tablet (81 mg total) by mouth daily. Swallow whole.    Dispense:  90 tablet    Refill:  3   empagliflozin (JARDIANCE) 10 MG TABS tablet    Sig: Take 1 tablet (10 mg total) by mouth daily before breakfast.    Dispense:  30 tablet    Refill:  3   olmesartan (BENICAR) 20 MG tablet    Sig: Take 1 tablet (20 mg total) by mouth daily.    Dispense:  30 tablet    Refill:  11   amLODipine (NORVASC) 10 MG tablet    Sig: Take 1 tablet (10 mg total) by mouth daily.    Dispense:  30 tablet    Refill:  11   furosemide (LASIX) 40 MG tablet    Sig: Take 1 tablet (40  mg total) by mouth daily.    Dispense:  30 tablet    Refill:  11     Follow up: 1 week   We look forward to seeing you next time. Please call our clinic at [phone] if you have any questions or concerns. The best time to call is Monday-Friday from 9am-4pm, but there is someone available 24/7. If after hours or the weekend, call the main hospital number and ask for the Internal Medicine Resident On-Call. If you need medication refills, please notify your pharmacy one week in advance and they will send [place] a request.   Thank you for trusting me with your care. Wishing you the best!   Rudene Christians, DO [facility]

## 2023-05-14 ENCOUNTER — Other Ambulatory Visit (HOSPITAL_COMMUNITY): Payer: Self-pay

## 2023-05-14 ENCOUNTER — Other Ambulatory Visit: Payer: Self-pay

## 2023-05-14 DIAGNOSIS — E662 Morbid (severe) obesity with alveolar hypoventilation: Secondary | ICD-10-CM | POA: Insufficient documentation

## 2023-05-14 DIAGNOSIS — Z1239 Encounter for other screening for malignant neoplasm of breast: Secondary | ICD-10-CM | POA: Insufficient documentation

## 2023-05-14 DIAGNOSIS — Z0000 Encounter for general adult medical examination without abnormal findings: Secondary | ICD-10-CM | POA: Insufficient documentation

## 2023-05-14 LAB — BMP8+ANION GAP
Anion Gap: 15 mmol/L (ref 10.0–18.0)
BUN/Creatinine Ratio: 13 (ref 9–23)
BUN: 10 mg/dL (ref 6–24)
CO2: 26 mmol/L (ref 20–29)
Calcium: 10.2 mg/dL (ref 8.7–10.2)
Chloride: 100 mmol/L (ref 96–106)
Creatinine, Ser: 0.78 mg/dL (ref 0.57–1.00)
Glucose: 113 mg/dL — ABNORMAL HIGH (ref 70–99)
Potassium: 4.7 mmol/L (ref 3.5–5.2)
Sodium: 141 mmol/L (ref 134–144)
eGFR: 92 mL/min/{1.73_m2} (ref >59)

## 2023-05-14 LAB — MICROALBUMIN / CREATININE URINE RATIO
Creatinine, Urine: 221.3 mg/dL
Microalb/Creat Ratio: 5 mg/g creat (ref 0–29)
Microalbumin, Urine: 11.6 ug/mL

## 2023-05-14 NOTE — Assessment & Plan Note (Signed)
She was admitted to [facility] in 2022 with multiple strokes to left posterior midbrain and right thalamus. TTE completed and showed no intracrdiac thrombus or PFO. There were no signs of afib on in house monitoring. Tox screen was positive for cocaine and THC.  She follows with [facility]. She competed 3 weeks of DAPT and should know be on asa 81 mg. Stroke thought to be cardioembolic in setting of arrythmia versus cardiomyopathy. Neuro referred to cardiology for implanted loop recorder 10/23. I do not see follow-up with cardiology on chart review and she has not been back to see neuro since then. P: -Asa 81 mg -follow-up next week about if she saw cardiology, if not consider referral for loop recorder

## 2023-05-14 NOTE — Assessment & Plan Note (Addendum)
She was confused about instructions from last office visit and did not increase lasix dosing.  She drinks >2L daily and is unsure about salt consumption. Since last visit she feels that her breathing feels about the same to a bit worse. She has had orthopnea since prior to hospital admission in [facility] several months ago. Her weight is up an additional 3 lbs.  P: Increase lasix 40 mg, new tablet sent to [facility] Start empagliflozin 10 mg F/u next week BMP with stable creatinine and electrolytes

## 2023-05-14 NOTE — Assessment & Plan Note (Addendum)
Current medications include degludec 15 units at bedtime, victoza 0.6 mg daily. She forgot to bring in glucometer. She denies having low blood sugars.  She had not been on insulin long term prior to recent hospital admissions. Last A1c in 6/24 at 8.2. She has never been admitted to hospital due to diabetes. P: I do not think she needs to be on insulin long term and I am concerned about this increasing weight. Follow-up next week and plan to review glucometer Continue victoza, could consider titration at follow-up Started empagliflozin Urine microalbumin/creatinine ratio

## 2023-05-14 NOTE — Assessment & Plan Note (Signed)
Mammogram ordered
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
## 2023-05-14 NOTE — Assessment & Plan Note (Signed)
She was sent home with Bipap after recent hospital admission. She has follow-up with pulm 7/22, which she is aware of this appointment.

## 2023-05-14 NOTE — Assessment & Plan Note (Signed)
Patient is unsure of what medications she should be taking. Since she established care with [facility] 2 weeks ago she has only been taking lasix, and 2 injectable medication for diabetes. Her blood pressure is elevated at 167/112 then to 197/129. She does not have valsartan at home. P: Start olmesartan 20 mg and amlodipine 5 mg Follow-up in 1 week

## 2023-05-20 ENCOUNTER — Telehealth: Payer: Self-pay | Admitting: *Deleted
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
## 2023-05-20 NOTE — Progress Notes (Signed)
 Internal Medicine Clinic Attending  Case discussed with the resident at the time of the visit.  We reviewed the resident's history and exam and pertinent patient test results.  I agree with the assessment, diagnosis, and plan of care documented in the resident's note.  

## 2023-05-23 ENCOUNTER — Telehealth: Payer: Self-pay | Admitting: Internal Medicine

## 2023-05-23 ENCOUNTER — Encounter: Payer: MEDICAID | Admitting: Internal Medicine

## 2023-05-23 ENCOUNTER — Other Ambulatory Visit (HOSPITAL_COMMUNITY): Payer: Self-pay

## 2023-05-23 DIAGNOSIS — G8929 Other chronic pain: Secondary | ICD-10-CM

## 2023-05-23 MED ORDER — FUROSEMIDE 40 MG PO TABS
40.0000 mg | ORAL_TABLET | Freq: Every day | ORAL | 11 refills | Status: DC
Start: 2023-05-23 — End: 2023-06-27

## 2023-05-23 MED ORDER — EMPAGLIFLOZIN 10 MG PO TABS
10.0000 mg | ORAL_TABLET | Freq: Every day | ORAL | 3 refills | Status: DC
Start: 2023-05-23 — End: 2023-07-29

## 2023-05-23 MED ORDER — INSULIN DEGLUDEC FLEXTOUCH 100 UNIT/ML ~~LOC~~ SOPN
15.0000 [IU] | PEN_INJECTOR | Freq: Every day | SUBCUTANEOUS | 3 refills | Status: DC
Start: 2023-05-23 — End: 2023-06-03

## 2023-05-23 MED ORDER — OLMESARTAN MEDOXOMIL 20 MG PO TABS
20.0000 mg | ORAL_TABLET | Freq: Every day | ORAL | 3 refills | Status: DC
Start: 2023-05-23 — End: 2023-11-11

## 2023-05-23 NOTE — Addendum Note (Signed)
Addended by: Lucille Passy on: 05/23/2023 09:24 AM   Modules accepted: Orders

## 2023-05-23 NOTE — Telephone Encounter (Signed)
[name] with PMH of HFpEF, COPD, chronic right knee pain.  She was to follow-up today for subacute HFpEF exacerbation. I called and talked with her as she did not come to appointment. She thought her appt today was with PT she did come to the hospital but did not know where to go.  She has been taking lasix 40 mg by doubling up meds but hasn't picked up any medications from last office visit.  P: I resent Lasix 40 mg, empagliflozin10 mg, olmesartan to [facility] She feels like breathing is worse this week and wants to know when follow-up appointment is. She is most interested in getting pain management clinic referral for right knee. Her MRI is scheduled 7/22. Even if MRI shows ligamentous tear I do think that ortho will want diabetes under better control prior to knee surgery. Original injury was in 2021.  Return precautions for increased shortness of breath or edema given. I encouraged her to get scale to weigh herself daily.

## 2023-05-23 NOTE — Telephone Encounter (Signed)
Internal Medicine Clinic Attending  Case discussed with the resident at the time of the visit.  We reviewed the resident's history and pertinent patient test results.  I agree with the assessment, diagnosis, and plan of care documented in the resident's note.  Debe Coder, MD

## 2023-05-24 ENCOUNTER — Other Ambulatory Visit: Payer: Self-pay | Admitting: Internal Medicine

## 2023-05-24 NOTE — Progress Notes (Addendum)
Called pt x2 but no answer. Was able to speak with the patient and she stated her referral to pain management is incomplete and needs that to be completed. Will forward message to the nursing staff. Pt states she will follow up on Monday.   Cathy Abbot, MD Eligha Bridegroom. [facility] Internal Medicine Residency, PGY-3

## 2023-05-27 ENCOUNTER — Encounter: Payer: Self-pay | Admitting: Pulmonary Disease

## 2023-05-27 ENCOUNTER — Ambulatory Visit (HOSPITAL_COMMUNITY)
Admission: RE | Admit: 2023-05-27 | Discharge: 2023-05-27 | Disposition: A | Discharge status: Home / Self Care | Payer: MEDICAID | Source: Ambulatory Visit | Attending: Internal Medicine | Admitting: Internal Medicine

## 2023-05-27 ENCOUNTER — Ambulatory Visit (INDEPENDENT_AMBULATORY_CARE_PROVIDER_SITE_OTHER): Payer: No Typology Code available for payment source | Admitting: Pulmonary Disease

## 2023-05-27 VITALS — BP 124/64 | HR 98 | Temp 98.2°F | Ht 61.0 in | Wt 282.8 lb

## 2023-05-27 DIAGNOSIS — M25561 Pain in right knee: Secondary | ICD-10-CM | POA: Diagnosis not present

## 2023-05-27 DIAGNOSIS — J449 Chronic obstructive pulmonary disease, unspecified: Secondary | ICD-10-CM

## 2023-05-27 DIAGNOSIS — R0602 Shortness of breath: Secondary | ICD-10-CM

## 2023-05-27 DIAGNOSIS — G4733 Obstructive sleep apnea (adult) (pediatric): Secondary | ICD-10-CM | POA: Diagnosis not present

## 2023-05-27 DIAGNOSIS — G8929 Other chronic pain: Secondary | ICD-10-CM | POA: Diagnosis present

## 2023-05-27 NOTE — Patient Instructions (Signed)
Please use the BiPAP every day when sleeping Continue the [facility] and Spiriva We continue your Singulair Will order an in lab split-night sleep study Schedule PFTs and follow-up in 4 months
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
## 2023-05-29 ENCOUNTER — Ambulatory Visit: Payer: 59

## 2023-05-29 ENCOUNTER — Other Ambulatory Visit (HOSPITAL_COMMUNITY): Payer: Self-pay

## 2023-05-29 ENCOUNTER — Telehealth: Payer: Self-pay | Admitting: *Deleted

## 2023-05-29 ENCOUNTER — Telehealth: Payer: Self-pay | Admitting: Pulmonary Disease

## 2023-05-29 ENCOUNTER — Other Ambulatory Visit: Payer: Self-pay

## 2023-05-29 MED ORDER — MOMETASONE FURO-FORMOTEROL FUM 100-5 MCG/ACT IN AERO: 2.0000 | INHALATION_SPRAY | Freq: Two times a day (BID) | RESPIRATORY_TRACT | 11 refills | Status: DC

## 2023-05-29 MED ORDER — TIOTROPIUM BROMIDE MONOHYDRATE 18 MCG IN CAPS: 18.0000 ug | ORAL_CAPSULE | Freq: Every day | RESPIRATORY_TRACT | 11 refills | Status: DC

## 2023-05-29 MED ORDER — MONTELUKAST SODIUM 10 MG PO TABS: 10.0000 mg | ORAL_TABLET | Freq: Every day | ORAL | 11 refills | Status: DC

## 2023-05-29 NOTE — Telephone Encounter (Signed)
PT states she needs all three of these medications:  Dulera, Spiriva Singulair  [facility] [address]  Her # is [phone]  Drug store has no script

## 2023-05-29 NOTE — Telephone Encounter (Signed)
I have sent in the prescriptions and notified the patient.  Nothing further needed. 

## 2023-05-29 NOTE — Telephone Encounter (Addendum)
Call from pt stating she's in a lot pain; requesting pain med. States she was seen yesterday at the pain clinic; they have not prescribed anything for pain yet, states they are running tests. Her next appt is next week. But states she needs something now for pain; she needs something stronger than Gabapentin and tylenol. She's requesting a call back from her doctor. Lynford Humphrey Also pt states Jardiance needs a PA and [facility] already [facility] to our office. I will ask Mel Almond and Dewayne Hatch to f/u.

## 2023-05-29 NOTE — Telephone Encounter (Signed)
Please schedule an inperson follow-up visit for this patient.

## 2023-05-29 NOTE — Telephone Encounter (Addendum)
Pt was called and informed to schedule an in-person appt to discuss pain/medication per Dr Sloan Leiter. First available appt 8/8 - pt has been scheduled 8/8 w/Dr Annie Paras @ 0945 AM. Pt stated she needs something for pain until her appt.

## 2023-05-30 ENCOUNTER — Ambulatory Visit (INDEPENDENT_AMBULATORY_CARE_PROVIDER_SITE_OTHER): Payer: No Typology Code available for payment source | Admitting: Student

## 2023-05-30 VITALS — BP 159/108 | HR 106 | Temp 98.2°F | Wt 271.6 lb

## 2023-05-30 DIAGNOSIS — E119 Type 2 diabetes mellitus without complications: Secondary | ICD-10-CM

## 2023-05-30 DIAGNOSIS — I10 Essential (primary) hypertension: Secondary | ICD-10-CM | POA: Diagnosis not present

## 2023-05-30 DIAGNOSIS — Z794 Long term (current) use of insulin: Secondary | ICD-10-CM

## 2023-05-30 DIAGNOSIS — M25561 Pain in right knee: Secondary | ICD-10-CM

## 2023-05-30 DIAGNOSIS — E1165 Type 2 diabetes mellitus with hyperglycemia: Secondary | ICD-10-CM

## 2023-05-30 DIAGNOSIS — F333 Major depressive disorder, recurrent, severe with psychotic symptoms: Secondary | ICD-10-CM | POA: Diagnosis not present

## 2023-05-30 DIAGNOSIS — Z0000 Encounter for general adult medical examination without abnormal findings: Secondary | ICD-10-CM

## 2023-05-30 DIAGNOSIS — G8929 Other chronic pain: Secondary | ICD-10-CM

## 2023-05-30 DIAGNOSIS — Z6841 Body Mass Index (BMI) 40.0 and over, adult: Secondary | ICD-10-CM

## 2023-05-30 MED ORDER — DULOXETINE HCL 30 MG PO CPEP
30.0000 mg | ORAL_CAPSULE | Freq: Every day | ORAL | 3 refills | Status: DC
Start: 2023-05-30 — End: 2023-11-11

## 2023-05-30 MED ORDER — DICLOFENAC SODIUM 1 % EX GEL
1.0000 | Freq: Four times a day (QID) | CUTANEOUS | 3 refills | Status: DC | PRN
Start: 2023-05-30 — End: 2023-11-11

## 2023-05-30 NOTE — Assessment & Plan Note (Signed)
159/108 on olmesartan 20 mg. Med rec at follow-up, she is uncertain of meds she is supposed to take. Increase ARB and add hydrochlorothiazide if still BP still elevated to this degree.
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
## 2023-05-30 NOTE — Telephone Encounter (Signed)
Appt has been resch to 05/30/2023 to see Dr. Benito Mccreedy.

## 2023-05-30 NOTE — Assessment & Plan Note (Addendum)
Chronic right knee pain, pain generator is severe ligamentous damage from old injury. Difficult situation for this person as she awaits intake for [facility]. Over the counter options ineffective thus far. Ketorolac is unsafe given history of stroke. Start duloxetine for chronic pain and depression. Orthopedics referral, perhaps intraarticular steroids could be used as temporizing measure. She is far from optimized for surgery at this juncture, but she is motivated today. Will have her back in a few weeks to talk about her chronic medical issues.

## 2023-05-30 NOTE — Assessment & Plan Note (Addendum)
Mood stable today, but lots of anguish over disabling pain. Fluoxetine discontinued in favor of duloxetine.     05/30/2023    4:18 PM 05/13/2023    4:17 PM 08/07/2020   10:10 AM  PHQ9 SCORE ONLY  PHQ-9 Total Score 8 9      Information is confidential and restricted. Go to Review Flowsheets to unlock data.

## 2023-05-30 NOTE — Assessment & Plan Note (Signed)
Body mass index is 51.32 kg/m.  Counseled about nutrition including eliminating sugary beverages like soda, sweet tea, and juice from diet. Recommended more vegetables, lean protein, and legumes. Frozen vegetables are healthy and inexpensive. Black beans are a healthy and inexpensive source of lean protein and fiber. Recommend gradually increasing exercise. A daily walk is a great way to start an exercise program.  Referral: yes  Pharmacological intervention: continue liraglutide

## 2023-05-30 NOTE — Assessment & Plan Note (Signed)
June 2024 A1c 8.6% on liraglutide 0.6 mg and insulin degludec 15 units daily. At follow-up increase liraglutide. She would be a good candidate for CGM, maybe we can get her a sample from our office.

## 2023-05-30 NOTE — Assessment & Plan Note (Deleted)
Sales promotion account executive Medical at [facility] center in [place]   Address: [address], [place], [place] [address]   Phone: [phone]

## 2023-05-30 NOTE — Patient Instructions (Addendum)
Sales promotion account executive Medical at [facility] center in [place], [place]   Address: [address], [place], [place] [address]   Phone: [phone]  Remember to bring all of the medications that you take (including over the counter medications and supplements) with you to every clinic visit.  This after visit summary is an important review of tests, referrals, and medication changes that were discussed during your visit. If you have questions or concerns, call [phone]. Outside of clinic business hours, call the main hospital at [phone] and ask the operator for the on-call internal medicine resident.   Marrianne Mood MD 05/30/2023, 3:52 PM

## 2023-05-30 NOTE — Assessment & Plan Note (Signed)
Medication reconciliation completed: no Medications present with patient: no Barriers to med rec: patient is uncertain about medications she is prescribed Patient reminded to bring medications to all doctor visits.  Foot exam completed today.

## 2023-06-02 NOTE — Progress Notes (Signed)
Internal Medicine Clinic Attending  Case discussed with the resident at the time of the visit.  We reviewed the resident's history and exam and pertinent patient test results.  I agree with the assessment, diagnosis, and plan of care documented in the resident's note.  

## 2023-06-03 ENCOUNTER — Telehealth: Payer: Self-pay

## 2023-06-03 ENCOUNTER — Other Ambulatory Visit: Payer: Self-pay | Admitting: *Deleted

## 2023-06-03 ENCOUNTER — Encounter: Payer: MEDICAID | Admitting: Internal Medicine

## 2023-06-03 ENCOUNTER — Other Ambulatory Visit (HOSPITAL_COMMUNITY): Payer: Self-pay

## 2023-06-03 DIAGNOSIS — E119 Type 2 diabetes mellitus without complications: Secondary | ICD-10-CM

## 2023-06-03 MED ORDER — INSULIN DEGLUDEC FLEXTOUCH 100 UNIT/ML ~~LOC~~ SOPN
15.0000 [IU] | PEN_INJECTOR | Freq: Every day | SUBCUTANEOUS | 3 refills | Status: DC
Start: 2023-06-03 — End: 2023-06-06
  Filled 2023-06-03: qty 15, 90d supply, fill #0

## 2023-06-03 NOTE — Telephone Encounter (Signed)
Decision:Denied Unable to approve the medication (VICTOZA Soln Pen-inj 18MG /3ML) due to unmet criteria (4) as  outlined in the plan guideline below.  Plan guideline (HIM.PA.53 Glucagon-Like Peptide-1 (GLP-1) Receptor Agonists) requires the  following to be met prior to consideration of approval: Preferred Glucagon-Like Peptide-1 (GLP-1) Receptor Agonists (RA) Therapy* (must meet all):  * If request is for a GLP-1 RA other than Victoza, Trulicity, Ozempic, Rybelsus, Soliqua, or  Xultophy, please refer to criteria set I.B below  1. Diagnosis of type 2 diabetes mellitus;  2. Request is for Victoza, Trulicity, Ozempic, Rybelsus, Soliqua, or Xultophy;  3. Age is one of the following (a or b):  a. Trulicity, Victoza: at least 10 years;  b. Ozempic, Rybelsus, Lawana Chambers, Xultophy: at least 18 years;  4. Requested product is not prescribed concurrently with another GLP-1 receptor agonist; 5. Dose does not exceed the Food and Drug Administration (FDA)-approved maximum  recommended dose.

## 2023-06-03 NOTE — Telephone Encounter (Addendum)
Prior Authorization for patient (Victoza) came through on cover my meds was submitted with last office notes and labs awaiting approval or denial.  NWG:[id number]

## 2023-06-04 ENCOUNTER — Other Ambulatory Visit (HOSPITAL_COMMUNITY): Payer: Self-pay

## 2023-06-04 ENCOUNTER — Telehealth: Payer: Self-pay

## 2023-06-04 NOTE — Telephone Encounter (Signed)
Prior Authorization for patient Cathy Hall FlexTouch -insulin degludec injection) came through on cover my meds was submitted with last office notes and labs awaiting approval or denial.  WUJ:[id number]

## 2023-06-04 NOTE — Telephone Encounter (Signed)
Decision:Approved Alba Cory (Key: [id number]) Rx #: [id number] Evaristo Bury FlexTouch (insulin degludec injection) 100 Units/mL solution Form [employer] Electronic Prior Authorization Form Created Message from Plan Approved. TRESIBA FLEXTOUCH 100UNIT/ML Soln Pen-inj is approved from 06/04/2023 to 06/03/2024. All strengths of the drug are approved.. Authorization Expiration Date: June 03, 2024.

## 2023-06-06 ENCOUNTER — Encounter: Payer: Self-pay | Admitting: Student

## 2023-06-06 ENCOUNTER — Telehealth: Payer: Self-pay

## 2023-06-06 ENCOUNTER — Ambulatory Visit (INDEPENDENT_AMBULATORY_CARE_PROVIDER_SITE_OTHER): Payer: No Typology Code available for payment source | Admitting: Student

## 2023-06-06 ENCOUNTER — Other Ambulatory Visit (HOSPITAL_COMMUNITY): Payer: Self-pay

## 2023-06-06 VITALS — BP 136/102 | HR 105 | Temp 98.6°F | Ht 61.0 in | Wt 280.7 lb

## 2023-06-06 DIAGNOSIS — M25561 Pain in right knee: Secondary | ICD-10-CM | POA: Diagnosis not present

## 2023-06-06 DIAGNOSIS — E119 Type 2 diabetes mellitus without complications: Secondary | ICD-10-CM

## 2023-06-06 DIAGNOSIS — G8929 Other chronic pain: Secondary | ICD-10-CM | POA: Diagnosis not present

## 2023-06-06 MED ORDER — INSULIN DEGLUDEC FLEXTOUCH 100 UNIT/ML ~~LOC~~ SOPN
15.0000 [IU] | PEN_INJECTOR | Freq: Every day | SUBCUTANEOUS | 3 refills | Status: DC
Start: 2023-06-06 — End: 2023-07-29
  Filled 2023-06-06 (×2): qty 15, 90d supply, fill #0
  Filled 2023-06-06: qty 15, fill #0

## 2023-06-06 MED ORDER — LIDOCAINE 5 % EX PTCH
1.0000 | MEDICATED_PATCH | Freq: Two times a day (BID) | CUTANEOUS | 0 refills | Status: DC
Start: 2023-06-06 — End: 2023-11-11
  Filled 2023-06-06 – 2023-06-13 (×3): qty 10, 5d supply, fill #0
  Filled 2023-06-13: qty 10, 10d supply, fill #0

## 2023-06-06 MED ORDER — DICLOFENAC SODIUM 1 % EX GEL
2.0000 g | Freq: Four times a day (QID) | CUTANEOUS | Status: DC
Start: 2023-06-06 — End: 2024-05-21

## 2023-06-06 NOTE — Assessment & Plan Note (Signed)
Previous A1c in June 2024 was 8.2. She will be due to A1c check in 1 month.  PLAN: -Insulin prescribed today  -recheck A1c in one month -follow up in one month for diabetes and A1c

## 2023-06-06 NOTE — Telephone Encounter (Signed)
Prior Authorization for patient (Lidocaine 5% patches) came through on cover my meds was submitted with last office notes awaiting approval or denial.  ZOX:[id number]
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
## 2023-06-06 NOTE — Assessment & Plan Note (Signed)
Patient was recently examined at the clinic for knee pain due to an old injury. She has an appointment with pain management on 06/13/23. The knee MRI is not final yet. The orthopedic referral was placed, wait on phone call. PLAN: -Avoid opioids -Patient has appointment at pain clinic; defer chronic pain management to pain clinic -Prescribed Voltaren gel and lidocaine  -Continue current regimen with topical treatments added.

## 2023-06-07 ENCOUNTER — Ambulatory Visit (INDEPENDENT_AMBULATORY_CARE_PROVIDER_SITE_OTHER): Payer: No Typology Code available for payment source | Admitting: Orthopaedic Surgery

## 2023-06-07 ENCOUNTER — Encounter: Payer: Self-pay | Admitting: Orthopaedic Surgery

## 2023-06-07 ENCOUNTER — Other Ambulatory Visit (HOSPITAL_COMMUNITY): Payer: Self-pay

## 2023-06-07 ENCOUNTER — Telehealth: Payer: Self-pay

## 2023-06-07 DIAGNOSIS — M1711 Unilateral primary osteoarthritis, right knee: Secondary | ICD-10-CM | POA: Diagnosis not present

## 2023-06-07 DIAGNOSIS — Z6841 Body Mass Index (BMI) 40.0 and over, adult: Secondary | ICD-10-CM

## 2023-06-07 MED ORDER — TRAMADOL HCL 50 MG PO TABS: 50.0000 mg | ORAL_TABLET | Freq: Two times a day (BID) | ORAL | 2 refills | Status: DC | PRN

## 2023-06-07 NOTE — Telephone Encounter (Signed)
Decision:Approved Alba Cory (KeySalvatore Marvel) PA Case ID #: [id number] Need Help? Call us at [phone] Outcome Approved on August 1 by [employer] 2017 Approved. LIDOCAINE 5% Patch is approved from 06/06/2023 to 06/05/2024. All strengths of the drug are approved. Authorization Expiration Date: 06/05/2024 Drug Lidocaine 5% patches ePA cloud logo Form [employer] Electronic Prior Authorization Form

## 2023-06-07 NOTE — Telephone Encounter (Signed)
Decision:Approved Cathy Hall (KeyRachel Moulds) PA Case ID #: [id number] Need Help? Call us at [phone] Outcome Approved today by [employer] 2017 Approved. JARDIANCE 10MG  Tablet is approved from 06/07/2023 to 06/06/2024. All strengths of the drug are approved. Authorization Expiration Date: 06/06/2024 Drug Jardiance 10MG  tablets ePA cloud logo Form [employer] Electronic Prior Authorization Form

## 2023-06-07 NOTE — Telephone Encounter (Signed)
Prior Authorization for patient Cathy Hall)  came through on cover my meds was submitted with last office notes and labs awaiting approval or denial.  QIH:[id number]

## 2023-06-07 NOTE — Progress Notes (Signed)
Internal Medicine Clinic Attending  I was physically present during the key portions of the resident provided service and participated in the medical decision making of patient's management care. I reviewed pertinent patient test results.  The assessment, diagnosis, and plan were formulated together and I agree with the documentation in the resident's note.  , , MD  
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
## 2023-06-13 ENCOUNTER — Other Ambulatory Visit (HOSPITAL_COMMUNITY): Payer: Self-pay

## 2023-06-13 ENCOUNTER — Encounter: Payer: MEDICAID | Admitting: Student

## 2023-06-18 ENCOUNTER — Ambulatory Visit: Payer: 59

## 2023-06-21 ENCOUNTER — Ambulatory Visit: Payer: 59

## 2023-06-24 ENCOUNTER — Telehealth: Payer: Self-pay | Admitting: Internal Medicine

## 2023-06-24 DIAGNOSIS — I5033 Acute on chronic diastolic (congestive) heart failure: Secondary | ICD-10-CM

## 2023-06-24 NOTE — Telephone Encounter (Signed)
furosemide (LASIX) 40 MG tablet   [id number] - [place], [place] - [address]

## 2023-06-26 ENCOUNTER — Ambulatory Visit (HOSPITAL_BASED_OUTPATIENT_CLINIC_OR_DEPARTMENT_OTHER): Payer: MEDICAID | Attending: Pulmonary Disease | Admitting: Pulmonary Disease

## 2023-06-26 DIAGNOSIS — G4733 Obstructive sleep apnea (adult) (pediatric): Secondary | ICD-10-CM | POA: Insufficient documentation

## 2023-06-26 NOTE — Telephone Encounter (Signed)
Please advise, pt is out of med

## 2023-06-27 MED ORDER — FUROSEMIDE 40 MG PO TABS
40.0000 mg | ORAL_TABLET | Freq: Every day | ORAL | 11 refills | Status: DC
Start: 2023-06-27 — End: 2023-11-11

## 2023-07-01 ENCOUNTER — Encounter: Payer: Self-pay | Admitting: Internal Medicine

## 2023-07-01 ENCOUNTER — Encounter: Payer: MEDICAID | Admitting: Dietician

## 2023-07-01 ENCOUNTER — Other Ambulatory Visit (HOSPITAL_COMMUNITY): Payer: Self-pay

## 2023-07-01 NOTE — Progress Notes (Deleted)
CC: DMII follow up.   HPI:  [name] is a 52 y.o. with medical history of HTN, HLD, CVA, DMII, HFpEF, MDD, and PTSD presenting to [facility] for a follow up on DMII. Last seen in 08/01 for a follow up and advised to return in one month for DMII follow up and repeat A1c.   Please see problem-based list for further details, assessments, and plans.  Past Medical History:  Diagnosis Date   Anxiety    Asthma    COPD (chronic obstructive pulmonary disease) (HCC)    CVA (cerebral vascular accident) (HCC)    Depression    Heart failure with preserved ejection fraction (HCC)    Hypertension    Major depression    Obesity hypoventilation syndrome (HCC)    Polysubstance abuse (HCC)    PTSD (post-traumatic stress disorder)     Current Outpatient Medications (Endocrine & Metabolic):    empagliflozin (JARDIANCE) 10 MG TABS tablet, Take 1 tablet (10 mg total) by mouth daily before breakfast.   Insulin Degludec FlexTouch 100 UNIT/ML SOPN, Inject 15 Units into the skin daily.   liraglutide (VICTOZA) 18 MG/3ML SOPN, Inject 0.6 mg into the skin daily.   Current Outpatient Medications (Cardiovascular):    atorvastatin (LIPITOR) 40 MG tablet, TAKE 1 TABLET (40 MG TOTAL) BY MOUTH DAILY. (Patient taking differently: Take 40 mg by mouth daily.)   furosemide (LASIX) 40 MG tablet, Take 1 tablet (40 mg total) by mouth daily.   olmesartan (BENICAR) 20 MG tablet, Take 1 tablet (20 mg total) by mouth daily.   Current Outpatient Medications (Respiratory):    albuterol (VENTOLIN HFA) 108 (90 Base) MCG/ACT inhaler, INHALE 2 PUFFS INTO THE LUNGS EVERY FOUR HOURS AS NEEDED FOR WHEEZING OR SHORTNESS OF BREATH. (Patient taking differently: Inhale 2 puffs into the lungs every 4 (four) hours as needed for wheezing or shortness of breath.)   fluticasone (FLONASE) 50 MCG/ACT nasal spray, PLACE 2 SPRAYS INTO BOTH NOSTRILS DAILY. (Patient taking differently: Place 2 sprays into both nostrils daily as  needed for allergies.)   loratadine (CLARITIN REDITABS) 10 MG dissolvable tablet, Take 10 mg by mouth daily.   mometasone-formoterol (DULERA) 100-5 MCG/ACT AERO, Inhale 2 puffs into the lungs 2 (two) times daily.   montelukast (SINGULAIR) 10 MG tablet, Take 1 tablet (10 mg total) by mouth at bedtime.   tiotropium (SPIRIVA) 18 MCG inhalation capsule, Place 1 capsule (18 mcg total) into inhaler and inhale daily.   Current Outpatient Medications (Analgesics):    acetaminophen (TYLENOL) 325 MG tablet, Take 650 mg by mouth 2 (two) times daily as needed for fever, headache or moderate pain.   aspirin EC 81 MG tablet, Take 1 tablet (81 mg total) by mouth daily. Swallow whole.   traMADol (ULTRAM) 50 MG tablet, Take 1 tablet (50 mg total) by mouth every 12 (twelve) hours as needed.   Current Outpatient Medications (Hematological):    clopidogrel (PLAVIX) 75 MG tablet, Take 75 mg by mouth every evening.   Ferrous Sulfate (IRON PO), Take 1 tablet by mouth daily.   Current Outpatient Medications (Other):    diclofenac Sodium (VOLTAREN) 1 % GEL, Apply 1 Application topically 4 (four) times daily as needed (pain).   DULoxetine (CYMBALTA) 30 MG capsule, Take 1 capsule (30 mg total) by mouth daily.   gabapentin (NEURONTIN) 300 MG capsule, Take 900 mg by mouth in the morning, at noon, and at bedtime.   hydrOXYzine (ATARAX) 50 MG tablet, Take 50 mg by mouth every 6 (  six) hours as needed for anxiety.   lidocaine (LIDODERM) 5 %, Place 1 patch onto the skin every 12 (twelve) hours. Remove & Discard patch within 12 hours or as directed by MD   melatonin 3 MG TABS tablet, Take 2 tablets (6 mg total) by mouth at bedtime.   Multiple Vitamin (MULTIVITAMIN WITH MINERALS) TABS tablet, Take 1 tablet by mouth daily.   nicotine (NICODERM CQ - DOSED IN MG/24 HOURS) 14 mg/24hr patch, Place 1 patch (14 mg total) onto the skin daily.   omeprazole (PRILOSEC) 20 MG capsule, Take 1 capsule (20 mg total) by mouth daily.    pantoprazole (PROTONIX) 40 MG tablet, TAKE 1 TABLET (40 MG TOTAL) BY MOUTH DAILY. (Patient taking differently: Take 40 mg by mouth daily.)   Polyvinyl Alcohol-Povidone (REFRESH OP), Place 1 drop into both eyes 2 (two) times daily as needed (dryness).   traZODone (DESYREL) 50 MG tablet, Take 1 tablet (50 mg total) by mouth at bedtime. (Patient taking differently: Take 50 mg by mouth at bedtime as needed for sleep.)  Current Facility-Administered Medications (Other):    diclofenac Sodium (VOLTAREN) 1 % topical gel 2 g  Review of Systems:  Review of system negative unless stated in the problem list or HPI.    Physical Exam:  There were no vitals filed for this visit. Physical Exam General: NAD HENT: NCAT Lungs: CTAB, no wheeze, rhonchi or rales.  Cardiovascular: Normal heart sounds, no r/m/g, 2+ pulses in all extremities. No LE edema Abdomen: No TTP, normal bowel sounds MSK: No asymmetry or muscle atrophy.  Skin: no lesions noted on exposed skin Neuro: Alert and oriented x4. CN grossly intact Psych: Normal mood and normal affect   Assessment & Plan:   No problem-specific Assessment & Plan notes found for this encounter.   See Encounters Tab for problem based charting.  Patient Discussed with Dr. {NAMES:[id number]::"Guilloud","Hoffman","Mullen","Narendra","Vincent","Machen","Lau","Hatcher","Williams"} Gwenevere Abbot, MD Eligha Bridegroom. [facility], PGY-3   DMII Insulin 15 units daily, victoza 0.6 mg daily, and jardiance 10 mg every day. Previous A1c was 8.2 3 months ago. A1c today is

## 2023-07-02 ENCOUNTER — Other Ambulatory Visit (HOSPITAL_COMMUNITY): Payer: Self-pay

## 2023-07-02 ENCOUNTER — Telehealth: Payer: Self-pay

## 2023-07-02 NOTE — Telephone Encounter (Signed)
Pa for pt  VICTOZA )  came through on cover my meds was submitted with last off notes and labs .Marland Kitchen Awaiting approval or denial ..       DECISION :    Approved on August 26 by [employer] 2017   Approved. VICTOZA 18MG /3ML Soln Pen-inj is approved from 07/01/2023 to 06/30/2024.    All strengths of the drug are approved. Authorization Expiration Date: 06/30/2024           ( COPY SENT TO PHARMACY AND PLACED TO SCAN TO CHART ALSO )

## 2023-07-03 DIAGNOSIS — G4733 Obstructive sleep apnea (adult) (pediatric): Secondary | ICD-10-CM

## 2023-07-03 NOTE — Procedures (Signed)
Patient Name: Cathy Hall, Cathy Hall Date: 06/26/2023 Gender: Female D.O.B: November 22, 1970 Age (years): 51 Referring Provider: Chilton Greathouse Height (inches): 61 Interpreting Physician: Cyril Mourning MD, ABSM Weight (lbs): 270 RPSGT: Armen Pickup BMI: 51 MRN: [id number] Neck Size: 17.00 <br> <br> CLINICAL INFORMATION Sleep Study Type: NPSG    Indication for sleep study: Obesity, Snoring, chronic hpercarbic respiratory failure    Epworth Sleepiness Score: 3    SLEEP STUDY TECHNIQUE As per the AASM Manual for the Scoring of Sleep and Associated Events v2.3 (April 2016) with a hypopnea requiring 4% desaturations.  The channels recorded and monitored were frontal, central and occipital EEG, electrooculogram (EOG), submentalis EMG (chin), nasal and oral airflow, thoracic and abdominal wall motion, anterior tibialis EMG, snore microphone, electrocardiogram, and pulse oximetry.  MEDICATIONS Medications self-administered by patient taken the night of the study : N/A  SLEEP ARCHITECTURE The study was initiated at 10:57:55 PM and ended at 5:00:10 AM.  Sleep onset time was 71.7 minutes and the sleep efficiency was 61.6%. The total sleep time was 223 minutes.  Stage REM latency was 125.0 minutes.  The patient spent 12.1% of the night in stage N1 sleep, 84.8% in stage N2 sleep, 0.0% in stage N3 and 3.1% in REM.  Alpha intrusion was absent.  Supine sleep was 0.00%.  RESPIRATORY PARAMETERS The overall apnea/hypopnea index (AHI) was 43.3 per hour. There were 1 total apneas, including 1 obstructive, 0 central and 0 mixed apneas. There were 160 hypopneas and 64 RERAs.  The AHI during Stage REM sleep was 34.3 per hour.  AHI while supine was N/A per hour.  The mean oxygen saturation was 92.1%. The minimum SpO2 during sleep was 79.0%.  loud snoring was noted during this study.  CARDIAC DATA The 2 lead EKG demonstrated sinus rhythm. The mean heart rate was 86.4 beats per minute.  Other EKG findings include: None.   LEG MOVEMENT DATA The total PLMS were 0 with a resulting PLMS index of 0.0. Associated arousal with leg movement index was 1.6 .  IMPRESSIONS - Severe obstructive sleep apnea occurred during this study (AHI = 43.3/h). Unfortunately study night could not be split due to the lack of enough sleep time - Moderate oxygen desaturation was noted during this study (Min O2 = 79.0%). - The patient snored with loud snoring volume. - No cardiac abnormalities were noted during this study. - Clinically significant periodic limb movements did not occur during sleep. No significant associated arousals.   DIAGNOSIS - Obstructive Sleep Apnea (G47.33) - Nocturnal Hypoxemia (G47.36)   RECOMMENDATIONS - Therapeutic PAP titration to determine optimal pressure required to alleviate sleep disordered breathing. - Avoid alcohol, sedatives and other CNS depressants that may worsen sleep apnea and disrupt normal sleep architecture. - Sleep hygiene should be reviewed to assess factors that may improve sleep quality. - Weight management and regular exercise should be initiated or continued if appropriate.  [Electronically signed] 07/03/2023 07:51 PM  Cyril Mourning MD, ABSM Diplomate, American Board of Sleep Medicine NPI: [id number]

## 2023-07-05 ENCOUNTER — Ambulatory Visit: Payer: MEDICAID

## 2023-07-10 ENCOUNTER — Other Ambulatory Visit: Payer: Self-pay | Admitting: Internal Medicine

## 2023-07-10 DIAGNOSIS — E1169 Type 2 diabetes mellitus with other specified complication: Secondary | ICD-10-CM

## 2023-07-10 NOTE — Telephone Encounter (Signed)
  montelukast (SINGULAIR) 10 MG tablet   liraglutide (VICTOZA) 18 MG/3ML SOPN  [facility] [place]

## 2023-07-11 ENCOUNTER — Other Ambulatory Visit (HOSPITAL_COMMUNITY): Payer: Self-pay

## 2023-07-11 ENCOUNTER — Other Ambulatory Visit: Payer: Self-pay

## 2023-07-11 MED ORDER — LIRAGLUTIDE 18 MG/3ML ~~LOC~~ SOPN
0.6000 mg | PEN_INJECTOR | Freq: Every day | SUBCUTANEOUS | 3 refills | Status: DC
  Filled 2023-07-11: qty 3, 30d supply, fill #0
  Filled 2023-07-11: qty 6, 60d supply, fill #0
  Filled 2023-07-11 – 2023-07-12 (×4): qty 3, 30d supply, fill #0

## 2023-07-11 MED ORDER — MONTELUKAST SODIUM 10 MG PO TABS
10.0000 mg | ORAL_TABLET | Freq: Every day | ORAL | 11 refills | Status: DC
  Filled 2023-07-11: qty 30, 30d supply, fill #0
  Filled 2023-08-30: qty 30, 30d supply, fill #1
  Filled 2023-09-30 – 2023-10-18 (×2): qty 30, 30d supply, fill #2

## 2023-07-12 ENCOUNTER — Other Ambulatory Visit (HOSPITAL_COMMUNITY): Payer: Self-pay

## 2023-07-15 ENCOUNTER — Telehealth: Payer: Self-pay

## 2023-07-15 NOTE — Addendum Note (Signed)
Addended by: Glynda Jaeger on: 07/15/2023 10:44 AM   Modules accepted: Orders

## 2023-07-15 NOTE — Progress Notes (Unsigned)
CC: DMII follow up.   HPI:  [name] is a 52 y.o. with medical history of HTN, HLD, CVA, DMII, HFpEF, MDD, and PTSD presenting to [facility] for a follow up on DMII. Last seen in 08/01 for a follow up and advised to return in one month for DMII follow up and repeat A1c.   Please see problem-based list for further details, assessments, and plans.  Past Medical History:  Diagnosis Date   Anxiety    Asthma    COPD (chronic obstructive pulmonary disease) (HCC)    CVA (cerebral vascular accident) (HCC)    Depression    Heart failure with preserved ejection fraction (HCC)    Hypertension    Major depression    Obesity hypoventilation syndrome (HCC)    Polysubstance abuse (HCC)    PTSD (post-traumatic stress disorder)     Current Outpatient Medications (Endocrine & Metabolic):    empagliflozin (JARDIANCE) 10 MG TABS tablet, Take 1 tablet (10 mg total) by mouth daily before breakfast.   Insulin Degludec FlexTouch 100 UNIT/ML SOPN, Inject 15 Units into the skin daily.   liraglutide (VICTOZA) 18 MG/3ML SOPN, Inject 0.6 mg into the skin daily.   Current Outpatient Medications (Cardiovascular):    atorvastatin (LIPITOR) 40 MG tablet, TAKE 1 TABLET (40 MG TOTAL) BY MOUTH DAILY. (Patient taking differently: Take 40 mg by mouth daily.)   furosemide (LASIX) 40 MG tablet, Take 1 tablet (40 mg total) by mouth daily.   olmesartan (BENICAR) 20 MG tablet, Take 1 tablet (20 mg total) by mouth daily.   Current Outpatient Medications (Respiratory):    albuterol (VENTOLIN HFA) 108 (90 Base) MCG/ACT inhaler, INHALE 2 PUFFS INTO THE LUNGS EVERY FOUR HOURS AS NEEDED FOR WHEEZING OR SHORTNESS OF BREATH. (Patient taking differently: Inhale 2 puffs into the lungs every 4 (four) hours as needed for wheezing or shortness of breath.)   fluticasone (FLONASE) 50 MCG/ACT nasal spray, PLACE 2 SPRAYS INTO BOTH NOSTRILS DAILY. (Patient taking differently: Place 2 sprays into both nostrils daily as  needed for allergies.)   loratadine (CLARITIN REDITABS) 10 MG dissolvable tablet, Take 10 mg by mouth daily.   mometasone-formoterol (DULERA) 100-5 MCG/ACT AERO, Inhale 2 puffs into the lungs 2 (two) times daily.   montelukast (SINGULAIR) 10 MG tablet, Take 1 tablet (10 mg total) by mouth at bedtime.   tiotropium (SPIRIVA) 18 MCG inhalation capsule, Place 1 capsule (18 mcg total) into inhaler and inhale daily.   Current Outpatient Medications (Analgesics):    acetaminophen (TYLENOL) 325 MG tablet, Take 650 mg by mouth 2 (two) times daily as needed for fever, headache or moderate pain.   aspirin EC 81 MG tablet, Take 1 tablet (81 mg total) by mouth daily. Swallow whole.   traMADol (ULTRAM) 50 MG tablet, Take 1 tablet (50 mg total) by mouth every 12 (twelve) hours as needed.   Current Outpatient Medications (Hematological):    clopidogrel (PLAVIX) 75 MG tablet, Take 75 mg by mouth every evening.   Ferrous Sulfate (IRON PO), Take 1 tablet by mouth daily.   Current Outpatient Medications (Other):    diclofenac Sodium (VOLTAREN) 1 % GEL, Apply 1 Application topically 4 (four) times daily as needed (pain).   DULoxetine (CYMBALTA) 30 MG capsule, Take 1 capsule (30 mg total) by mouth daily.   gabapentin (NEURONTIN) 300 MG capsule, Take 900 mg by mouth in the morning, at noon, and at bedtime.   hydrOXYzine (ATARAX) 50 MG tablet, Take 50 mg by mouth every 6 (  six) hours as needed for anxiety.   lidocaine (LIDODERM) 5 %, Place 1 patch onto the skin every 12 (twelve) hours. Remove & Discard patch within 12 hours or as directed by MD   melatonin 3 MG TABS tablet, Take 2 tablets (6 mg total) by mouth at bedtime.   Multiple Vitamin (MULTIVITAMIN WITH MINERALS) TABS tablet, Take 1 tablet by mouth daily.   nicotine (NICODERM CQ - DOSED IN MG/24 HOURS) 14 mg/24hr patch, Place 1 patch (14 mg total) onto the skin daily.   omeprazole (PRILOSEC) 20 MG capsule, Take 1 capsule (20 mg total) by mouth daily.    pantoprazole (PROTONIX) 40 MG tablet, TAKE 1 TABLET (40 MG TOTAL) BY MOUTH DAILY. (Patient taking differently: Take 40 mg by mouth daily.)   Polyvinyl Alcohol-Povidone (REFRESH OP), Place 1 drop into both eyes 2 (two) times daily as needed (dryness).   traZODone (DESYREL) 50 MG tablet, Take 1 tablet (50 mg total) by mouth at bedtime. (Patient taking differently: Take 50 mg by mouth at bedtime as needed for sleep.)  Current Facility-Administered Medications (Other):    diclofenac Sodium (VOLTAREN) 1 % topical gel 2 g  Review of Systems:  Review of system negative unless stated in the problem list or HPI.    Physical Exam:  There were no vitals filed for this visit. Physical Exam General: NAD HENT: NCAT Lungs: CTAB, no wheeze, rhonchi or rales.  Cardiovascular: Normal heart sounds, no r/m/g, 2+ pulses in all extremities. No LE edema Abdomen: No TTP, normal bowel sounds MSK: No asymmetry or muscle atrophy.  Skin: no lesions noted on exposed skin Neuro: Alert and oriented x4. CN grossly intact Psych: Normal mood and normal affect   Assessment & Plan:   No problem-specific Assessment & Plan notes found for this encounter.   See Encounters Tab for problem based charting.  Patient Discussed with Dr. {NAMES:[id number]::"Guilloud","Hoffman","Mullen","Narendra","Vincent","Machen","Lau","Hatcher","Williams"} Gwenevere Abbot, MD Eligha Bridegroom. [facility] Internal Medicine Residency, PGY-3   DMII Insulin 15 units daily, victoza 0.6 mg daily, and jardiance 10 mg every day. Previous A1c was 8.2 3 months ago. A1c today is

## 2023-07-15 NOTE — Telephone Encounter (Signed)
Called and spoke with pt about sleep study results. Cpap titration study ordered. Nfn

## 2023-07-15 NOTE — Telephone Encounter (Signed)
-----   Message from [facility] sent at 07/15/2023 10:09 AM EDT ----- Please let patient know that sleep study shows severe sleep apnea with low oxygen levels.  Order sleep titration study

## 2023-07-16 ENCOUNTER — Other Ambulatory Visit (HOSPITAL_COMMUNITY): Payer: Self-pay

## 2023-07-16 ENCOUNTER — Encounter: Payer: Self-pay | Admitting: Internal Medicine

## 2023-07-16 ENCOUNTER — Other Ambulatory Visit: Payer: Self-pay

## 2023-07-16 ENCOUNTER — Ambulatory Visit: Payer: MEDICAID | Admitting: Dietician

## 2023-07-16 ENCOUNTER — Ambulatory Visit (INDEPENDENT_AMBULATORY_CARE_PROVIDER_SITE_OTHER): Payer: No Typology Code available for payment source | Admitting: Internal Medicine

## 2023-07-16 VITALS — BP 118/74 | HR 95 | Temp 98.0°F | Ht 61.0 in | Wt 281.9 lb

## 2023-07-16 DIAGNOSIS — M25561 Pain in right knee: Secondary | ICD-10-CM

## 2023-07-16 DIAGNOSIS — Z794 Long term (current) use of insulin: Secondary | ICD-10-CM

## 2023-07-16 DIAGNOSIS — Z8673 Personal history of transient ischemic attack (TIA), and cerebral infarction without residual deficits: Secondary | ICD-10-CM

## 2023-07-16 DIAGNOSIS — G8929 Other chronic pain: Secondary | ICD-10-CM

## 2023-07-16 DIAGNOSIS — E1165 Type 2 diabetes mellitus with hyperglycemia: Secondary | ICD-10-CM | POA: Diagnosis not present

## 2023-07-16 DIAGNOSIS — Z23 Encounter for immunization: Secondary | ICD-10-CM

## 2023-07-16 DIAGNOSIS — Z0000 Encounter for general adult medical examination without abnormal findings: Secondary | ICD-10-CM

## 2023-07-16 LAB — POCT GLYCOSYLATED HEMOGLOBIN (HGB A1C): Hemoglobin A1C: 7.5 % — AB (ref 4.0–5.6)

## 2023-07-16 LAB — GLUCOSE, CAPILLARY: Glucose-Capillary: 232 mg/dL — ABNORMAL HIGH (ref 70–99)

## 2023-07-16 MED ORDER — PREGABALIN 50 MG PO CAPS
50.0000 mg | ORAL_CAPSULE | Freq: Three times a day (TID) | ORAL | 2 refills | Status: DC
Start: 2023-07-16 — End: 2023-11-11
  Filled 2023-07-16 – 2023-08-30 (×4): qty 90, 30d supply, fill #0
  Filled 2023-09-30 – 2023-10-18 (×2): qty 90, 30d supply, fill #1

## 2023-07-16 MED ORDER — TRAMADOL HCL 50 MG PO TABS
100.0000 mg | ORAL_TABLET | Freq: Two times a day (BID) | ORAL | 0 refills | Status: DC | PRN
Start: 2023-07-16 — End: 2023-08-30
  Filled 2023-07-16: qty 60, 15d supply, fill #0
  Filled 2023-07-16: qty 28, 7d supply, fill #0
  Filled 2023-08-07: qty 28, 7d supply, fill #1
  Filled 2023-08-30: qty 4, 1d supply, fill #2

## 2023-07-16 MED ORDER — MOUNJARO 2.5 MG/0.5ML ~~LOC~~ SOAJ
2.5000 mg | SUBCUTANEOUS | 0 refills | Status: DC
  Filled 2023-07-16 – 2023-08-07 (×2): qty 2, 28d supply, fill #0

## 2023-07-16 NOTE — Patient Instructions (Signed)
[name] Cathy Hall, it was a pleasure seeing you today! You endorsed feeling well today. Below are some of the things we talked about this visit. We look forward to seeing you in the follow up appointment!  Today we discussed: We will check your A1c today. We will change your victoza to once a week medicine for your diabetes.  For your knee pain, take 100 mg of tramadol every 12 hours. Please take tylenol on scheduled basis, and use ice and heat alternating.  We will follow up on PT referral and follow up on pain referral.   I have ordered the following labs today:   Lab Orders         POC Hbg A1C       Referrals ordered today:   Referral Orders  No referral(s) requested today     I have ordered the following medication/changed the following medications:   Stop the following medications: Medications Discontinued During This Encounter  Medication Reason   clopidogrel (PLAVIX) 75 MG tablet Patient has not taken in last 30 days   gabapentin (NEURONTIN) 300 MG capsule Patient has not taken in last 30 days   omeprazole (PRILOSEC) 20 MG capsule Patient has not taken in last 30 days   pantoprazole (PROTONIX) 40 MG tablet Patient has not taken in last 30 days     Start the following medications: No orders of the defined types were placed in this encounter.    Follow-up: 1 month follow up   Please make sure to arrive 15 minutes prior to your next appointment. If you arrive late, you may be asked to reschedule.   We look forward to seeing you next time. Please call our clinic at [phone] if you have any questions or concerns. The best time to call is Monday-Friday from 9am-4pm, but there is someone available 24/7. If after hours or the weekend, call the main hospital number and ask for the Internal Medicine Resident On-Call. If you need medication refills, please notify your pharmacy one week in advance and they will send [place] a request.  Thank you for letting us take  part in your care. Wishing you the best!  Thank you, Gwenevere Abbot, MD

## 2023-07-16 NOTE — Progress Notes (Signed)
   Diabetes Self-Management Education  Visit Type: First/Initial  Appt. Start Time: 1030 Appt. End Time: 1100  07/16/2023  Ms. Alba Cory, identified by name and date of birth, is a 52 y.o. female with a diagnosis of Diabetes: Type 2.   ASSESSMENT  Lab Results  Component Value Date   HGBA1C 7.5 (A) 07/16/2023   HGBA1C 8.2 (H) 04/11/2023   HGBA1C 6.5 (H) 11/22/2021   HGBA1C 6.5 (H) 11/22/2021   HGBA1C 5.5 11/06/2020    Estimated body mass index is 53.26 kg/m as calculated from the following:   Height as of an earlier encounter on 07/16/23: 5\' 1"  (1.549 m).   Weight as of an earlier encounter on 07/16/23: 281 lb 14.4 oz (127.9 kg). Wt Readings from Last 10 Encounters:  07/16/23 281 lb 14.4 oz (127.9 kg)  06/26/23 270 lb (122.5 kg)  06/06/23 280 lb 11.2 oz (127.3 kg)  05/30/23 271 lb 9.6 oz (123.2 kg)  05/27/23 282 lb 12.8 oz (128.3 kg)  05/13/23 283 lb (128.4 kg)  04/25/23 280 lb 4.8 oz (127.1 kg)  04/12/23 268 lb (121.6 kg)  01/31/23 247 lb (112 kg)  11/30/20 238 lb 12.1 oz (108.3 kg)  Thinks she is holding on to fluid as she forgets to take her lasix sometimes. Thinks pill boxes may help. One was provided to her today.  She wants help to decrease her weight so she can have surgery on her knee.    Diabetes Self-Management Education - 07/16/23 1100       Health Coping   How would you rate your overall health? --   patient left before assessing this            Individualized Plan for Diabetes Self-Management Training:   Learning Objective:  Patient will have a greater understanding of diabetes self-management. Patient education plan is to attend individual and/or group sessions per assessed needs and concerns.   Plan:   Patient Instructions  Thank you for your visit today!  We talked about how important it is to eat protein throughout the day with each meal and snacks. This is especially true when trying to decrease weight and also when trying to  decrease water weight.   I recommend eating lower salt foods as well as lower calorie foods and smaller portions  Here are some ideas-   Make your own 5 minute grits instead of using instant.  Make your own cheese and crackers with swiss cheese and low sodium triscuits or whole wheat bread ([employer] has a good [employer] whole wheat bread)  Be sure to eat a vegetable, protein and starch at both lunch and dinner.   Combine a fruit or vegetable or whole grain with a protein food for a snack or meal.  See handouts for ideas.  Please make a follow up in 2-3 weeks with Norm Parcel, Dietitian and Diabetes Educator [phone]  Expected Outcomes:  Demonstrated interest in learning but significant barriers to change  Education material provided: My Plate, Snack sheet, and Diabetes Resources  If problems or questions, patient to contact team via:  Phone and Email  Future DSME appointment: 2 wks Norm Parcel, RD 07/16/2023 1:38 PM.

## 2023-07-16 NOTE — Patient Instructions (Addendum)
Thank you for your visit today!  We talked about how important it is to eat protein throughout the day with each meal and snacks. This is especially true when trying to decrease weight and also when trying to decrease water weight.   I recommend eating lower salt foods as well as lower calorie foods and smaller portions  Here are some ideas-   Make your own 5 minute grits instead of using instant.  Make your own cheese and crackers with swiss cheese and low sodium triscuits or whole wheat bread ([employer] has a good [employer] whole wheat bread)  Be sure to eat a vegetable, protein and starch at both lunch and dinner.   Combine a fruit or vegetable or whole grain with a protein food for a snack or meal.  See handouts for ideas.  Please make a follow up in 2-3 weeks with Norm Parcel, Dietitian and Diabetes Educator [phone]

## 2023-07-17 ENCOUNTER — Other Ambulatory Visit (HOSPITAL_COMMUNITY): Payer: Self-pay
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
## 2023-07-18 NOTE — Assessment & Plan Note (Signed)
Flu shot given this visit. 

## 2023-07-18 NOTE — Assessment & Plan Note (Signed)
Insulin 15 units daily, victoza 0.6 mg daily, and jardiance 10 mg every day. Previous A1c was 8.2 3 months ago. A1c today is 7.5. Will switch pt from Victoza to [facility] to improve glycemic control and maximize weight loss benefit.

## 2023-07-19 ENCOUNTER — Other Ambulatory Visit (HOSPITAL_COMMUNITY): Payer: Self-pay

## 2023-07-22 ENCOUNTER — Ambulatory Visit: Payer: MEDICAID | Attending: Internal Medicine | Admitting: Physical Therapy

## 2023-07-25 ENCOUNTER — Telehealth: Payer: Self-pay | Admitting: Internal Medicine

## 2023-07-25 NOTE — Telephone Encounter (Signed)
Mounjaro rx written 9/10. I called pt - no answer; left message for pt to call the pharmacy and for further questions, to call our office back.

## 2023-07-25 NOTE — Telephone Encounter (Signed)
   tirzepatide (MOUNJARO) 2.5 MG/0.5ML Pen    [facility] [place]

## 2023-07-27 NOTE — Progress Notes (Signed)
Internal Medicine Clinic Attending  Case discussed with the resident at the time of the visit.  We reviewed the resident's history and exam and pertinent patient test results.  I agree with the assessment, diagnosis, and plan of care documented in the resident's note.  Debe Coder, MD

## 2023-07-29 ENCOUNTER — Telehealth: Payer: Self-pay

## 2023-07-29 ENCOUNTER — Other Ambulatory Visit: Payer: Self-pay

## 2023-07-29 DIAGNOSIS — Z8673 Personal history of transient ischemic attack (TIA), and cerebral infarction without residual deficits: Secondary | ICD-10-CM

## 2023-07-29 DIAGNOSIS — E119 Type 2 diabetes mellitus without complications: Secondary | ICD-10-CM

## 2023-07-29 MED ORDER — EMPAGLIFLOZIN 10 MG PO TABS
10.0000 mg | ORAL_TABLET | Freq: Every day | ORAL | 3 refills | Status: DC
  Filled 2023-07-29: qty 90, 90d supply, fill #0
  Filled 2023-11-07: qty 90, 90d supply, fill #1

## 2023-07-29 MED ORDER — INSULIN DEGLUDEC FLEXTOUCH 100 UNIT/ML ~~LOC~~ SOPN
15.0000 [IU] | PEN_INJECTOR | Freq: Every day | SUBCUTANEOUS | 3 refills | Status: DC
Start: 2023-07-29 — End: 2023-11-11
  Filled 2023-07-29: qty 15, 90d supply, fill #0
  Filled 2023-08-30: qty 15, 100d supply, fill #0
  Filled 2023-08-30: qty 15, 90d supply, fill #0

## 2023-07-29 NOTE — Telephone Encounter (Signed)
Prior Authorization for patient Cathy Hall 2.5MG /0.5ML)  came through on cover my meds was submitted with last office notes and labs awaiting approval or denial.  ZOX:[id number]

## 2023-07-29 NOTE — Telephone Encounter (Signed)
Patient is calling for a rx refill, she is requesting all of her medications to be sent to our pharmacy from now on.

## 2023-07-30 ENCOUNTER — Other Ambulatory Visit: Payer: Self-pay

## 2023-07-30 ENCOUNTER — Other Ambulatory Visit (HOSPITAL_COMMUNITY): Payer: Self-pay

## 2023-08-05 NOTE — Telephone Encounter (Signed)
Denied:Denied Denied on September 23 by [employer] 2017 Denied

## 2023-08-07 ENCOUNTER — Other Ambulatory Visit (HOSPITAL_COMMUNITY): Payer: Self-pay

## 2023-08-07 ENCOUNTER — Telehealth: Payer: Self-pay

## 2023-08-07 DIAGNOSIS — E1165 Type 2 diabetes mellitus with hyperglycemia: Secondary | ICD-10-CM

## 2023-08-07 MED ORDER — LIRAGLUTIDE 18 MG/3ML ~~LOC~~ SOPN
0.6000 mg | PEN_INJECTOR | Freq: Every day | SUBCUTANEOUS | 3 refills | Status: DC
  Filled 2023-08-07: qty 9, 90d supply, fill #0
  Filled 2023-08-07: qty 3, 30d supply, fill #0
  Filled 2023-11-07: qty 9, 90d supply, fill #1

## 2023-08-07 NOTE — Telephone Encounter (Signed)
Pt called bout her [facility]  ) she  got a letter stating that the  med was  denied ...  She stated that at her visit  with Dr Welton Flakes he D/C  the victoza that I had did  the PA on and gotten approved  even though she had not picked up the med .Marland Kitchen The pa that was approved   was approved 14 days before ...  Pt will bring the  denial letter to office ....    She is also requesting to have her victoza since it was approved until  2025

## 2023-08-07 NOTE — Telephone Encounter (Signed)
Reordering Victoza for this patient since it is covered by her insurance.  I will start with 0.6 mg daily which she will continue until she follows up with Dr. Sloan Leiter on 08/27/2023.  I recommend increasing at that point if she is not having any side effects.

## 2023-08-23 ENCOUNTER — Other Ambulatory Visit (HOSPITAL_COMMUNITY): Payer: Self-pay

## 2023-08-27 ENCOUNTER — Ambulatory Visit (INDEPENDENT_AMBULATORY_CARE_PROVIDER_SITE_OTHER): Payer: No Typology Code available for payment source | Admitting: Internal Medicine

## 2023-08-27 ENCOUNTER — Encounter: Payer: Self-pay | Admitting: Internal Medicine

## 2023-08-27 VITALS — BP 111/90 | HR 111 | Ht 61.0 in | Wt 279.1 lb

## 2023-08-27 DIAGNOSIS — L02211 Cutaneous abscess of abdominal wall: Secondary | ICD-10-CM

## 2023-08-27 DIAGNOSIS — B351 Tinea unguium: Secondary | ICD-10-CM | POA: Diagnosis not present

## 2023-08-27 DIAGNOSIS — L600 Ingrowing nail: Secondary | ICD-10-CM

## 2023-08-27 DIAGNOSIS — E119 Type 2 diabetes mellitus without complications: Secondary | ICD-10-CM
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
## 2023-08-27 NOTE — Patient Instructions (Addendum)
Thank you, Ms.Margrete Evet Dukes-Enaiho for allowing [place] to provide your care today.   Diabetes I will check inbox for papers regarding mounjaro. I am sorry about the delay with that. If I do not see papers I will give you a call.  Foot symptoms You right toe is ingrown. I have referred you to a podiatrist for this.   Boil Please apply warm compresses to area multiple times daily for next few days.   Referrals ordered today:    Referral Orders         Ambulatory referral to Podiatry      I have ordered the following medication/changed the following medications:   Stop the following medications: There are no discontinued medications.   Start the following medications: No orders of the defined types were placed in this encounter.    Follow up: 2 months  We look forward to seeing you next time. Please call our clinic at [phone] if you have any questions or concerns. The best time to call is Monday-Friday from 9am-4pm, but there is someone available 24/7. If after hours or the weekend, call the main hospital number and ask for the Internal Medicine Resident On-Call. If you need medication refills, please notify your pharmacy one week in advance and they will send [place] a request.   Thank you for trusting me with your care. Wishing you the best!   Rudene Christians, DO [facility]

## 2023-08-28 DIAGNOSIS — L0291 Cutaneous abscess, unspecified: Secondary | ICD-10-CM | POA: Insufficient documentation

## 2023-08-28 DIAGNOSIS — L600 Ingrowing nail: Secondary | ICD-10-CM | POA: Insufficient documentation

## 2023-08-28 NOTE — Assessment & Plan Note (Signed)
Patient reports pain to medial surface of right great toe. She goes to nail salon and they have been using various tools to keep nail out of skin. Exam with tenderness along medial nail border but no erythema or warmth. P: With diabetes her feet are high risk, will refer to podiatry

## 2023-08-28 NOTE — Assessment & Plan Note (Signed)
For the last 4 days she has had pain and tenderness to part of lower abdominal wall. She has not seen drainage from site. Denies fevers. She is not applying anything to area. A:exam consistent with small skin abscess. No surrounding erythema concerning for cellulitis.  P: Warm compresses. No antibiotics indicated at this time

## 2023-08-29 NOTE — Progress Notes (Signed)
 Internal Medicine Clinic Attending  Case discussed with the resident physician at the time of the visit.  We reviewed the patient's history, exam, and pertinent patient test results.  I agree with the assessment, diagnosis, and plan of care documented in the resident's note.

## 2023-08-30 ENCOUNTER — Other Ambulatory Visit: Payer: Self-pay | Admitting: Internal Medicine

## 2023-08-30 ENCOUNTER — Other Ambulatory Visit (HOSPITAL_COMMUNITY): Payer: Self-pay

## 2023-08-30 DIAGNOSIS — G8929 Other chronic pain: Secondary | ICD-10-CM

## 2023-09-02 ENCOUNTER — Other Ambulatory Visit (HOSPITAL_COMMUNITY): Payer: Self-pay

## 2023-09-02 MED ORDER — TRAMADOL HCL 50 MG PO TABS
100.0000 mg | ORAL_TABLET | Freq: Two times a day (BID) | ORAL | 0 refills | Status: DC | PRN
Start: 2023-09-02 — End: 2023-11-11
  Filled 2023-09-02: qty 60, 15d supply, fill #0

## 2023-09-06 ENCOUNTER — Ambulatory Visit: Payer: MEDICAID | Admitting: Podiatry

## 2023-09-12 ENCOUNTER — Other Ambulatory Visit (HOSPITAL_COMMUNITY): Payer: Self-pay

## 2023-09-18 ENCOUNTER — Telehealth: Payer: Self-pay | Admitting: Internal Medicine

## 2023-09-18 NOTE — Telephone Encounter (Signed)
Return pt's call - c/o pain in her throat and gland swollen; and causing her ear to hurt. We have no open appts this week at this time. Advise pt to go to UC to be evaluated; inform pt [facility] is an option. States she will go there.

## 2023-09-18 NOTE — Telephone Encounter (Signed)
Patient requesting a call  back. Patient having throat and Left ear pain x 1 week in a half.

## 2023-09-19 ENCOUNTER — Ambulatory Visit
Admission: RE | Admit: 2023-09-19 | Discharge: 2023-09-19 | Disposition: A | Discharge status: Home / Self Care | Payer: MEDICAID | Source: Ambulatory Visit | Attending: Internal Medicine | Admitting: Internal Medicine

## 2023-09-19 DIAGNOSIS — Z1231 Encounter for screening mammogram for malignant neoplasm of breast: Secondary | ICD-10-CM

## 2023-09-25 ENCOUNTER — Encounter: Payer: Self-pay | Admitting: Podiatry

## 2023-09-25 ENCOUNTER — Ambulatory Visit (INDEPENDENT_AMBULATORY_CARE_PROVIDER_SITE_OTHER): Payer: MEDICAID | Admitting: Podiatry

## 2023-09-25 DIAGNOSIS — M79674 Pain in right toe(s): Secondary | ICD-10-CM

## 2023-09-25 DIAGNOSIS — B351 Tinea unguium: Secondary | ICD-10-CM | POA: Diagnosis not present

## 2023-09-25 DIAGNOSIS — L600 Ingrowing nail: Secondary | ICD-10-CM

## 2023-09-25 DIAGNOSIS — E1140 Type 2 diabetes mellitus with diabetic neuropathy, unspecified: Secondary | ICD-10-CM | POA: Diagnosis not present

## 2023-09-25 DIAGNOSIS — M79675 Pain in left toe(s): Secondary | ICD-10-CM

## 2023-09-25 NOTE — Patient Instructions (Signed)

## 2023-09-26 NOTE — Progress Notes (Signed)
Subjective:   Patient ID: Cathy Hall, female   DOB: 52 y.o.   MRN: [id number]   HPI Patient presents with several different problems with a painful ingrown toenail right hallux medial border that she has tried to trim herself and thickened nailbeds 1-5 both feet that get bothersome.  The patient does have diabetes has done a good job her last A1c is 7.5 and she states that she has really had better numbers since then over the last couple months.  Patient smokes a half a pack per day and is not active with moderate obesity   Review of Systems  All other systems reviewed and are negative.       Objective:  Physical Exam Vitals and nursing note reviewed.  Constitutional:      Appearance: She is well-developed.  Pulmonary:     Effort: Pulmonary effort is normal.  Musculoskeletal:        General: Normal range of motion.  Skin:    General: Skin is warm.  Neurological:     Mental Status: She is alert.     Neurovascular status was found to be intact moderate diminishment of neurological sharp dull vibratory.  Patient was found to have thick nailbeds 1-5 both feet that get tender and was found to have an incurvated right hallux medial border that is painful when pressed and make shoe gear difficult.  Patient does have good digital perfusion well-oriented x 3     Assessment:  Ingrown toenail deformity right hallux with mycotic nail component of nailbeds 1 through 5 left 1 through 5 right     Plan:  H&P reviewed conditions with patient.  She would like to get this nail fixed permanently due to the intense discomfort and I did explain procedure I allowed her to read consent form understanding risk and patient signed consent form.  I infiltrated the right big toe 60 mg Xylocaine Marcaine mixture sterile prep done and using sterile instrumentation I remove the medial border exposed matrix I applied phenol 3 applications 30 seconds followed by alcohol lavage sterile dressing  gave instructions on soaks and to wear dressing 24 hours but take it off earlier if throbbing were to occur.  I encouraged her to call with questions and I debrided all remaining nails

## 2023-09-30 ENCOUNTER — Other Ambulatory Visit (HOSPITAL_COMMUNITY): Payer: Self-pay

## 2023-10-01 ENCOUNTER — Other Ambulatory Visit: Payer: Self-pay | Admitting: Internal Medicine

## 2023-10-01 ENCOUNTER — Telehealth: Payer: Self-pay

## 2023-10-01 DIAGNOSIS — R928 Other abnormal and inconclusive findings on diagnostic imaging of breast: Secondary | ICD-10-CM

## 2023-10-01 NOTE — Telephone Encounter (Signed)
Requesting a mammogram results, please call pt back.

## 2023-10-10 ENCOUNTER — Other Ambulatory Visit (HOSPITAL_COMMUNITY): Payer: Self-pay

## 2023-10-18 ENCOUNTER — Other Ambulatory Visit (HOSPITAL_COMMUNITY): Payer: Self-pay

## 2023-10-19 ENCOUNTER — Ambulatory Visit
Admission: RE | Admit: 2023-10-19 | Discharge: 2023-10-19 | Disposition: A | Discharge status: Home / Self Care | Payer: MEDICAID | Source: Ambulatory Visit | Attending: Internal Medicine | Admitting: Internal Medicine

## 2023-10-19 ENCOUNTER — Other Ambulatory Visit: Payer: Self-pay | Admitting: Internal Medicine

## 2023-10-19 ENCOUNTER — Ambulatory Visit
Admission: RE | Admit: 2023-10-19 | Discharge: 2023-10-19 | Disposition: A | Discharge status: Home / Self Care | Payer: MEDICAID | Source: Ambulatory Visit | Attending: Internal Medicine

## 2023-10-19 DIAGNOSIS — R928 Other abnormal and inconclusive findings on diagnostic imaging of breast: Secondary | ICD-10-CM

## 2023-10-19 DIAGNOSIS — N6320 Unspecified lump in the left breast, unspecified quadrant: Secondary | ICD-10-CM

## 2023-10-22 ENCOUNTER — Encounter: Payer: Self-pay | Admitting: Internal Medicine

## 2023-10-22 ENCOUNTER — Ambulatory Visit
Admission: RE | Admit: 2023-10-22 | Discharge: 2023-10-22 | Disposition: A | Discharge status: Home / Self Care | Payer: MEDICAID | Source: Ambulatory Visit | Attending: Internal Medicine | Admitting: Internal Medicine

## 2023-10-22 DIAGNOSIS — N6320 Unspecified lump in the left breast, unspecified quadrant: Secondary | ICD-10-CM

## 2023-10-22 DIAGNOSIS — R928 Other abnormal and inconclusive findings on diagnostic imaging of breast: Secondary | ICD-10-CM

## 2023-10-22 HISTORY — PX: BREAST BIOPSY: SHX20

## 2023-10-23 ENCOUNTER — Other Ambulatory Visit: Payer: MEDICAID

## 2023-10-23 LAB — SURGICAL PATHOLOGY

## 2023-11-01 ENCOUNTER — Telehealth: Payer: Self-pay | Admitting: Pulmonary Disease

## 2023-11-01 MED ORDER — TIOTROPIUM BROMIDE MONOHYDRATE 18 MCG IN CAPS: 18.0000 ug | ORAL_CAPSULE | Freq: Every day | RESPIRATORY_TRACT | 11 refills | Status: DC

## 2023-11-01 MED ORDER — MONTELUKAST SODIUM 10 MG PO TABS: 10.0000 mg | ORAL_TABLET | Freq: Every day | ORAL | 11 refills | Status: DC

## 2023-11-01 NOTE — Telephone Encounter (Signed)
Singulair and Spiriva capsules ordered to preferred pharmacy with 11 refills.

## 2023-11-01 NOTE — Telephone Encounter (Signed)
Pt pharmacy calling in to get a refill sent in for montelukast (SINGULAIR) 10 MG tablet & tiotropium (SPIRIVA) 18 MCG inhalation capsule  [facility] [address], [place] - [address]

## 2023-11-07 ENCOUNTER — Other Ambulatory Visit (HOSPITAL_COMMUNITY): Payer: Self-pay

## 2023-11-11 ENCOUNTER — Other Ambulatory Visit: Payer: Self-pay | Admitting: *Deleted

## 2023-11-11 DIAGNOSIS — Z8673 Personal history of transient ischemic attack (TIA), and cerebral infarction without residual deficits: Secondary | ICD-10-CM

## 2023-11-11 DIAGNOSIS — E119 Type 2 diabetes mellitus without complications: Secondary | ICD-10-CM

## 2023-11-11 DIAGNOSIS — F333 Major depressive disorder, recurrent, severe with psychotic symptoms: Secondary | ICD-10-CM

## 2023-11-11 DIAGNOSIS — F1910 Other psychoactive substance abuse, uncomplicated: Secondary | ICD-10-CM

## 2023-11-11 DIAGNOSIS — E1165 Type 2 diabetes mellitus with hyperglycemia: Secondary | ICD-10-CM

## 2023-11-11 DIAGNOSIS — G8929 Other chronic pain: Secondary | ICD-10-CM

## 2023-11-11 DIAGNOSIS — I5033 Acute on chronic diastolic (congestive) heart failure: Secondary | ICD-10-CM

## 2023-11-11 DIAGNOSIS — J42 Unspecified chronic bronchitis: Secondary | ICD-10-CM

## 2023-11-11 DIAGNOSIS — I10 Essential (primary) hypertension: Secondary | ICD-10-CM

## 2023-11-11 DIAGNOSIS — G4700 Insomnia, unspecified: Secondary | ICD-10-CM

## 2023-11-11 DIAGNOSIS — I739 Peripheral vascular disease, unspecified: Secondary | ICD-10-CM

## 2023-11-11 MED ORDER — TRAZODONE HCL 50 MG PO TABS: 50.0000 mg | ORAL_TABLET | Freq: Every day | ORAL | 0 refills | Status: DC

## 2023-11-11 MED ORDER — NICOTINE 14 MG/24HR TD PT24: 14.0000 mg | MEDICATED_PATCH | Freq: Every day | TRANSDERMAL | 0 refills | Status: DC

## 2023-11-11 MED ORDER — HYDROXYZINE HCL 50 MG PO TABS: 50.0000 mg | ORAL_TABLET | Freq: Four times a day (QID) | ORAL | 0 refills | Status: AC | PRN

## 2023-11-11 MED ORDER — FUROSEMIDE 40 MG PO TABS
40.0000 mg | ORAL_TABLET | Freq: Every day | ORAL | 3 refills | Status: DC
  Filled 2024-04-27: qty 90, 90d supply, fill #0

## 2023-11-11 MED ORDER — DICLOFENAC SODIUM 1 % EX GEL
1.0000 | Freq: Four times a day (QID) | CUTANEOUS | 3 refills | Status: AC | PRN
  Filled 2024-04-27: qty 100, 30d supply, fill #0
  Filled 2024-06-29 (×2): qty 100, 30d supply, fill #1
  Filled 2024-10-02 (×2): qty 100, 30d supply, fill #2

## 2023-11-11 MED ORDER — INSULIN DEGLUDEC FLEXTOUCH 100 UNIT/ML ~~LOC~~ SOPN: 15.0000 [IU] | PEN_INJECTOR | Freq: Every day | SUBCUTANEOUS | 3 refills | Status: DC

## 2023-11-11 MED ORDER — ALBUTEROL SULFATE HFA 108 (90 BASE) MCG/ACT IN AERS
2.0000 | INHALATION_SPRAY | RESPIRATORY_TRACT | 0 refills | Status: AC | PRN
  Filled 2024-10-02: qty 6.7, 16d supply, fill #0

## 2023-11-11 MED ORDER — DULOXETINE HCL 30 MG PO CPEP
30.0000 mg | ORAL_CAPSULE | Freq: Every day | ORAL | 3 refills | Status: DC
  Filled 2024-04-27: qty 90, 90d supply, fill #0

## 2023-11-11 MED ORDER — PREGABALIN 50 MG PO CAPS: 50.0000 mg | ORAL_CAPSULE | Freq: Three times a day (TID) | ORAL | 2 refills | Status: DC

## 2023-11-11 MED ORDER — TIOTROPIUM BROMIDE MONOHYDRATE 18 MCG IN CAPS
18.0000 ug | ORAL_CAPSULE | Freq: Every day | RESPIRATORY_TRACT | 3 refills | Status: AC
  Filled 2024-04-27: qty 90, 90d supply, fill #0

## 2023-11-11 MED ORDER — ATORVASTATIN CALCIUM 40 MG PO TABS: 40.0000 mg | ORAL_TABLET | Freq: Every day | ORAL | 3 refills | Status: DC

## 2023-11-11 MED ORDER — LORATADINE 10 MG PO TBDP: 10.0000 mg | ORAL_TABLET | Freq: Every day | ORAL | 0 refills | Status: AC

## 2023-11-11 MED ORDER — EMPAGLIFLOZIN 10 MG PO TABS: 10.0000 mg | ORAL_TABLET | Freq: Every day | ORAL | 3 refills | Status: DC

## 2023-11-11 MED ORDER — MOMETASONE FURO-FORMOTEROL FUM 100-5 MCG/ACT IN AERO
2.0000 | INHALATION_SPRAY | Freq: Two times a day (BID) | RESPIRATORY_TRACT | 11 refills | Status: DC
  Filled 2024-04-27: qty 13, 30d supply, fill #0

## 2023-11-11 MED ORDER — OLMESARTAN MEDOXOMIL 20 MG PO TABS
20.0000 mg | ORAL_TABLET | Freq: Every day | ORAL | 3 refills | Status: DC
  Filled 2024-04-27: qty 90, 90d supply, fill #0
  Filled 2024-09-09 – 2024-10-02 (×2): qty 90, 90d supply, fill #1

## 2023-11-11 MED ORDER — ASPIRIN 81 MG PO TBEC
81.0000 mg | DELAYED_RELEASE_TABLET | Freq: Every day | ORAL | 3 refills | Status: DC
  Filled 2024-04-27: qty 90, 90d supply, fill #0

## 2023-11-11 MED ORDER — TRAMADOL HCL 50 MG PO TABS: 100.0000 mg | ORAL_TABLET | Freq: Two times a day (BID) | ORAL | 0 refills | Status: DC | PRN

## 2023-11-11 MED ORDER — LIDOCAINE 5 % EX PTCH: 1.0000 | MEDICATED_PATCH | Freq: Two times a day (BID) | CUTANEOUS | 0 refills | Status: DC

## 2023-11-11 MED ORDER — MONTELUKAST SODIUM 10 MG PO TABS: 10.0000 mg | ORAL_TABLET | Freq: Every day | ORAL | 11 refills | Status: DC

## 2023-11-11 MED ORDER — LIRAGLUTIDE 18 MG/3ML ~~LOC~~ SOPN: 0.6000 mg | PEN_INJECTOR | Freq: Every day | SUBCUTANEOUS | 3 refills | Status: DC

## 2023-11-11 NOTE — Telephone Encounter (Signed)
 Call from pt requesting all her meds sent to mail order pharmacy; [facility].

## 2023-11-12 MED ORDER — NICOTINE 14 MG/24HR TD PT24: 14.0000 mg | MEDICATED_PATCH | Freq: Every day | TRANSDERMAL | 0 refills | Status: AC

## 2023-11-12 NOTE — Addendum Note (Signed)
 Addended by: Hassan Buckler on: 11/12/2023 08:39 AM   Modules accepted: Orders

## 2023-11-20 ENCOUNTER — Other Ambulatory Visit: Payer: Self-pay | Admitting: Internal Medicine

## 2023-11-20 ENCOUNTER — Telehealth: Payer: Self-pay

## 2023-11-20 ENCOUNTER — Encounter: Payer: Self-pay | Admitting: Internal Medicine

## 2023-11-20 DIAGNOSIS — F1910 Other psychoactive substance abuse, uncomplicated: Secondary | ICD-10-CM

## 2023-11-20 DIAGNOSIS — G4700 Insomnia, unspecified: Secondary | ICD-10-CM

## 2023-11-20 DIAGNOSIS — J42 Unspecified chronic bronchitis: Secondary | ICD-10-CM

## 2023-11-20 NOTE — Telephone Encounter (Signed)
 Prior Authorization for patient (Victoza  18MG /3ML pen-injectors) came through on cover my meds was was submitted with last office notes and labs awaiting approval or denial.  KEY:[id number]

## 2023-11-21 ENCOUNTER — Other Ambulatory Visit: Payer: Self-pay | Admitting: Internal Medicine

## 2023-11-21 DIAGNOSIS — F1910 Other psychoactive substance abuse, uncomplicated: Secondary | ICD-10-CM

## 2023-11-21 DIAGNOSIS — J42 Unspecified chronic bronchitis: Secondary | ICD-10-CM

## 2023-11-21 DIAGNOSIS — G4700 Insomnia, unspecified: Secondary | ICD-10-CM

## 2023-11-21 NOTE — Telephone Encounter (Signed)
All medications last been refilled on 11/11/23

## 2023-11-21 NOTE — Telephone Encounter (Signed)
Decision:Approved  Leonia Reader (Key: [id number]) PA Case ID #: [id number] Need Help? Call us at [phone] Outcome Approved today by [employer] Better 2017 [employer] Approved. Approved for VICTOZA Soln Pen-inj 18MG /3ML, quantity up to 3 ML per 30 days, under the pharmacy benefit. The drug has been approved from 11/21/2023 to 11/20/2024. Generic or biosimilar substitution may be required when available and preferred on the formulary. Please note that dispensing of non-maintenance and specialty medications may be limited to a monthly supply. Authorization Expiration Date: 11/20/2024 Drug Victoza 18MG Ronny Bacon pen-injectors ePA cloud logo Form [employer] Electronic Prior Authorization Form 2017 NCPDP

## 2023-11-21 NOTE — Telephone Encounter (Signed)
Pt has an appt tomorrow with Dr Allena Katz.

## 2023-11-22 ENCOUNTER — Ambulatory Visit (INDEPENDENT_AMBULATORY_CARE_PROVIDER_SITE_OTHER): Payer: MEDICAID | Admitting: Student

## 2023-11-22 ENCOUNTER — Encounter: Payer: Self-pay | Admitting: Student

## 2023-11-22 VITALS — BP 115/82 | HR 91 | Temp 98.6°F | Ht 61.0 in | Wt 286.9 lb

## 2023-11-22 DIAGNOSIS — E119 Type 2 diabetes mellitus without complications: Secondary | ICD-10-CM

## 2023-11-22 DIAGNOSIS — E1165 Type 2 diabetes mellitus with hyperglycemia: Secondary | ICD-10-CM

## 2023-11-22 DIAGNOSIS — R300 Dysuria: Secondary | ICD-10-CM | POA: Diagnosis not present

## 2023-11-22 DIAGNOSIS — I10 Essential (primary) hypertension: Secondary | ICD-10-CM

## 2023-11-22 DIAGNOSIS — Z794 Long term (current) use of insulin: Secondary | ICD-10-CM | POA: Diagnosis not present

## 2023-11-22 DIAGNOSIS — M545 Low back pain, unspecified: Secondary | ICD-10-CM | POA: Insufficient documentation

## 2023-11-22 DIAGNOSIS — Z7984 Long term (current) use of oral hypoglycemic drugs: Secondary | ICD-10-CM

## 2023-11-22 LAB — URINALYSIS, ROUTINE W REFLEX MICROSCOPIC
Bacteria, UA: NONE SEEN
Bilirubin Urine: NEGATIVE
Glucose, UA: >=500 mg/dL — AB
Hgb urine dipstick: NEGATIVE
Ketones, ur: NEGATIVE mg/dL
Leukocytes,Ua: NEGATIVE
Nitrite: NEGATIVE
Protein, ur: NEGATIVE mg/dL
Specific Gravity, Urine: 1.025 (ref 1.005–1.030)
pH: 6 (ref 5.0–8.0)

## 2023-11-22 LAB — GLUCOSE, CAPILLARY: Glucose-Capillary: 180 mg/dL — ABNORMAL HIGH (ref 70–99)

## 2023-11-22 LAB — POCT GLYCOSYLATED HEMOGLOBIN (HGB A1C): Hemoglobin A1C: 7.7 % — AB (ref 4.0–5.6)

## 2023-11-22 MED ORDER — CYCLOBENZAPRINE HCL 10 MG PO TABS: 10.0000 mg | ORAL_TABLET | Freq: Three times a day (TID) | ORAL | 0 refills | Status: DC | PRN

## 2023-11-22 MED ORDER — LIRAGLUTIDE 18 MG/3ML ~~LOC~~ SOPN: 1.2000 mg | PEN_INJECTOR | Freq: Every day | SUBCUTANEOUS | 3 refills | Status: DC

## 2023-11-22 MED ORDER — INSULIN DEGLUDEC FLEXTOUCH 100 UNIT/ML ~~LOC~~ SOPN: 20.0000 [IU] | PEN_INJECTOR | Freq: Every day | SUBCUTANEOUS | 3 refills | Status: DC

## 2023-11-22 NOTE — Patient Instructions (Signed)
Cathy Hall,Thank you for allowing me to take part in your care today.  Here are your instructions.  1.  Regarding your back pain I am starting you on Flexeril.  He can take this 3 times a day.  This can make you drowsy.  Do not operate any heavy machinery or drive a car when you take this medicine.  2.  Regarding diabetes your A1c was 7.7.  I have increased your insulin to 20 units daily.  I have increased your Victoza to 1.2 mg daily.  3.  I have checked some labs today.  I will call you with the results which are urine.  If need be I will send you an antibiotic in.  4.  Please come back in 3 months for diabetes follow-up. Continue taking all of your other medications.  Thank you, Dr. Allena Katz  If you have any other questions please contact the internal medicine clinic at [phone] If it is after hours, please call the [place] hospital at [phone] and then ask the person who picks up for the resident on call.

## 2023-11-22 NOTE — Assessment & Plan Note (Addendum)
Patient has a past medical history of type 2 diabetes mellitus.  A1c is slightly up from 7.5-7.7 today.  She reports her fasting sugars are in the 150s.  She denies any polyuria or polydipsia.  She states she has been adherent to her medications.  Plan: -Increase insulin to 20 units daily -Increase Victoza to 1.2 mg daily, if patient can tolerate increase in Victoza could go up to 1.8 -Follow-up in 3 months for A1c check -Continue Jardiance 10 mg daily

## 2023-11-22 NOTE — Progress Notes (Signed)
CC: Diabetes follow-up  HPI:  [name] is a 53 y.o. female with a past medical history of type 2 diabetes, PAD, hypertension, HFpEF who presents for follow-up appointment.  Please see assessment and plan for full HPI.  Medications: Asthma: Albuterol, Spiriva, Dulera PAD: Aspirin 81 mg, Lipitor 40 mg Depression: Cymbalta 30 mg daily, hydroxyzine 50 mg every 6 hours as needed Diabetes: Jardiance 10 mg daily, insulin 15 units daily, Victoza 0.6 mg daily Pain: Tramadol 100 mg every 12 as needed, Lyrica 50 mg 3 times daily Insomnia: Trazodone 50 mg nightly HFpEF: Lasix 40 mg daily, Jardiance 10 mg daily  Past Medical History:  Diagnosis Date   Anxiety    Asthma    COPD (chronic obstructive pulmonary disease) (HCC)    CVA (cerebral vascular accident) (HCC)    Depression    Heart failure with preserved ejection fraction (HCC)    Hypertension    Major depression    Obesity hypoventilation syndrome (HCC)    Polysubstance abuse (HCC)    PTSD (post-traumatic stress disorder)      Current Outpatient Medications:    cyclobenzaprine (FLEXERIL) 10 MG tablet, Take 1 tablet (10 mg total) by mouth 3 (three) times daily as needed for muscle spasms., Disp: 30 tablet, Rfl: 0   acetaminophen (TYLENOL) 325 MG tablet, Take 650 mg by mouth 2 (two) times daily as needed for fever, headache or moderate pain., Disp: , Rfl:    albuterol (VENTOLIN HFA) 108 (90 Base) MCG/ACT inhaler, INHALE 2 PUFFS INTO THE LUNGS EVERY FOUR HOURS AS NEEDED FOR WHEEZING OR SHORTNESS OF BREATH., Disp: 18 g, Rfl: 0   aspirin EC 81 MG tablet, Take 1 tablet (81 mg total) by mouth daily. Swallow whole., Disp: 90 tablet, Rfl: 3   atorvastatin (LIPITOR) 40 MG tablet, TAKE 1 TABLET (40 MG TOTAL) BY MOUTH DAILY., Disp: 90 tablet, Rfl: 3   diclofenac Sodium (VOLTAREN) 1 % GEL, Apply 1 Application topically 4 (four) times daily as needed (pain)., Disp: 350 g, Rfl: 3   DULoxetine (CYMBALTA) 30 MG capsule, Take 1  capsule (30 mg total) by mouth daily., Disp: 90 capsule, Rfl: 3   empagliflozin (JARDIANCE) 10 MG TABS tablet, Take 1 tablet (10 mg total) by mouth daily before breakfast., Disp: 90 tablet, Rfl: 3   Ferrous Sulfate (IRON PO), Take 1 tablet by mouth daily., Disp: , Rfl:    fluticasone (FLONASE) 50 MCG/ACT nasal spray, PLACE 2 SPRAYS INTO BOTH NOSTRILS DAILY. (Patient taking differently: Place 2 sprays into both nostrils daily as needed for allergies.), Disp: 16 g, Rfl: 0   furosemide (LASIX) 40 MG tablet, Take 1 tablet (40 mg total) by mouth daily., Disp: 90 tablet, Rfl: 3   hydrOXYzine (ATARAX) 50 MG tablet, Take 1 tablet (50 mg total) by mouth every 6 (six) hours as needed for anxiety., Disp: 30 tablet, Rfl: 0   Insulin Degludec FlexTouch 100 UNIT/ML SOPN, Inject 20 Units into the skin daily., Disp: 15 mL, Rfl: 3   lidocaine (LIDODERM) 5 %, Place 1 patch onto the skin every 12 (twelve) hours. Remove & Discard patch within 12 hours or as directed by MD, Disp: 10 patch, Rfl: 0   liraglutide (VICTOZA) 18 MG/3ML SOPN, Inject 1.2 mg into the skin daily., Disp: 9 mL, Rfl: 3   loratadine (CLARITIN REDITABS) 10 MG dissolvable tablet, Take 1 tablet (10 mg total) by mouth daily., Disp: 30 tablet, Rfl: 0   melatonin 3 MG TABS tablet, Take 2 tablets (6 mg  total) by mouth at bedtime., Disp: 60 tablet, Rfl: 0   mometasone-formoterol (DULERA) 100-5 MCG/ACT AERO, Inhale 2 puffs into the lungs 2 (two) times daily., Disp: 1 each, Rfl: 11   montelukast (SINGULAIR) 10 MG tablet, Take 1 tablet (10 mg total) by mouth at bedtime., Disp: 30 tablet, Rfl: 11   Multiple Vitamin (MULTIVITAMIN WITH MINERALS) TABS tablet, Take 1 tablet by mouth daily., Disp: 30 tablet, Rfl: 0   nicotine (NICODERM CQ - DOSED IN MG/24 HOURS) 14 mg/24hr patch, Place 1 patch (14 mg total) onto the skin daily., Disp: 28 patch, Rfl: 0   olmesartan (BENICAR) 20 MG tablet, Take 1 tablet (20 mg total) by mouth daily., Disp: 90 tablet, Rfl: 3   Polyvinyl  Alcohol-Povidone (REFRESH OP), Place 1 drop into both eyes 2 (two) times daily as needed (dryness)., Disp: , Rfl:    pregabalin (LYRICA) 50 MG capsule, Take 1 capsule (50 mg total) by mouth 3 (three) times daily., Disp: 90 capsule, Rfl: 2   tiotropium (SPIRIVA) 18 MCG inhalation capsule, Place 1 capsule (18 mcg total) into inhaler and inhale daily., Disp: 90 capsule, Rfl: 3   traMADol (ULTRAM) 50 MG tablet, Take 2 tablets (100 mg total) by mouth every 12 (twelve) hours as needed., Disp: 60 tablet, Rfl: 0   traZODone (DESYREL) 50 MG tablet, Take 1 tablet (50 mg total) by mouth at bedtime., Disp: 30 tablet, Rfl: 0  Current Facility-Administered Medications:    diclofenac Sodium (VOLTAREN) 1 % topical gel 2 g, 2 g, Topical, QID,   Review of Systems:   MSK: Patient endorses back pain  Physical Exam:  Vitals:   11/22/23 0831  BP: 115/82  Pulse: 91  Temp: 98.6 F (37 C)  TempSrc: Oral  SpO2: 96%  Weight: 286 lb 14.4 oz (130.1 kg)  Height: 5\' 1"  (1.549 m)   General: Patient is sitting comfortably in the room  Cardio: Regular rate and rhythm, no murmurs, rubs or gallops Pulmonary: Clear to ausculation bilaterally with no rales, rhonchi, and crackles   Back: No midline thoracic, lumbar, or cervical tenderness.  No step-off or deformities.  There is left lumbar paraspinal tenderness.  Limited range of motion secondary to pain.   Assessment & Plan:   Essential hypertension Blood pressure well-controlled today.  Blood pressure is 115/82.  Plan: -Continue Lasix 40 mg daily -Continue lifestyle modifications  T2DM (type 2 diabetes mellitus) (HCC) Patient has a past medical history of type 2 diabetes mellitus.  A1c is slightly up from 7.5-7.7 today.  She reports her fasting sugars are in the 150s.  She denies any polyuria or polydipsia.  She states she has been adherent to her medications.  Plan: -Increase insulin to 20 units daily -Increase Victoza to 1.2 mg daily, if patient can  tolerate increase in Victoza could go up to 1.8 -Follow-up in 3 months for A1c check -Continue Jardiance 10 mg daily  Dysuria Patient presents with 1 week history of dysuria.  She also reports having a foul odor to her urine.  She reports having chills.  She denies any hematuria.  With concern for cystitis, will plan to check urine today.    Plan: -Check UA with reflex to culture positive -If positive, will start nitrofurantoin  Acute left-sided low back pain Patient endorses 1 week history of back pain.  She states she was getting up from the toilet, and felt a sudden pain.  She describes it as a sharp pain that is 9 out of 10 in  intensity.  She describes it to be constant.  She states heating pad has helped.  She denies improvement with Tylenol.  She has tried to stretch it out as well and it did not help.  On exam, patient does have some left lumbar paraspinal tenderness consistent with possible muscle spasm.  With lack of systemic symptoms do not think this is related to cystitis and likely is 2 separate problems.  Plan: -Flexeril 10 mg 3 times daily -Patient encouraged to continue back stretches  Patient discussed with Dr. Letta Kocher, DO PGY-2 Internal Medicine Resident  Pager: [phone]

## 2023-11-22 NOTE — Assessment & Plan Note (Addendum)
Patient presents with 1 week history of dysuria.  She also reports having a foul odor to her urine.  She reports having chills.  She denies any hematuria.  With concern for cystitis, will plan to check urine today.    Plan: -Check UA with reflex to culture positive -If positive, will start nitrofurantoin

## 2023-11-22 NOTE — Assessment & Plan Note (Signed)
Blood pressure well-controlled today.  Blood pressure is 115/82.  Plan: -Continue Lasix 40 mg daily -Continue lifestyle modifications

## 2023-11-22 NOTE — Assessment & Plan Note (Signed)
Patient endorses 1 week history of back pain.  She states she was getting up from the toilet, and felt a sudden pain.  She describes it as a sharp pain that is 9 out of 10 in intensity.  She describes it to be constant.  She states heating pad has helped.  She denies improvement with Tylenol.  She has tried to stretch it out as well and it did not help.  On exam, patient does have some left lumbar paraspinal tenderness consistent with possible muscle spasm.  With lack of systemic symptoms do not think this is related to cystitis and likely is 2 separate problems.  Plan: -Flexeril 10 mg 3 times daily -Patient encouraged to continue back stretches

## 2023-11-23 LAB — URINE CULTURE

## 2023-11-25 ENCOUNTER — Other Ambulatory Visit: Payer: Self-pay | Admitting: Internal Medicine

## 2023-11-25 DIAGNOSIS — F1910 Other psychoactive substance abuse, uncomplicated: Secondary | ICD-10-CM

## 2023-11-25 DIAGNOSIS — J42 Unspecified chronic bronchitis: Secondary | ICD-10-CM

## 2023-11-25 DIAGNOSIS — G4700 Insomnia, unspecified: Secondary | ICD-10-CM

## 2023-11-25 NOTE — Progress Notes (Signed)
 Internal Medicine Clinic Attending  Case discussed with the resident physician at the time of the visit.  We reviewed the patient's history, exam, and pertinent patient test results.  I agree with the assessment, diagnosis, and plan of care documented in the resident's note.

## 2023-11-26 ENCOUNTER — Other Ambulatory Visit: Payer: Self-pay | Admitting: Student

## 2023-11-26 DIAGNOSIS — M545 Low back pain, unspecified: Secondary | ICD-10-CM

## 2023-11-28 ENCOUNTER — Other Ambulatory Visit: Payer: Self-pay | Admitting: Internal Medicine

## 2023-11-28 DIAGNOSIS — E119 Type 2 diabetes mellitus without complications: Secondary | ICD-10-CM

## 2023-11-29 NOTE — Telephone Encounter (Signed)
Cathy Hall is a 53 year old with type 2 diabetes.  Current medications include degludec 20 units, Victoza 1.2 mg daily and empagliflozin 10 mg daily.  Her pharmacy sent a message as her co-pay for her degludec was over $150 per month.  I called and talked with patient.  She still has several doses of degludec remaining.  She does find this cost to be very high and would be interested in switching insulin.  I advised her to finish out the remainder of degludec and let her know that I would be sending in glargine 15 units for her to take.  I talked with her about decreasing the dose to make sure she tolerated new formulation of insulin well.  P: Discontinue degludec Start glargine 15 units Continue Victoza 1.2 mg daily Continue empagliflozin 10 mg

## 2023-11-29 NOTE — Telephone Encounter (Signed)
Per pharmacy "THIS MED HAS A HIGH COPAY OF $156.63. PATIENT IS UNABLE TO PAY. CAN YOU SEND IN A CHEAPER ALT INSULIN? "

## 2023-12-06 NOTE — Telephone Encounter (Signed)
Pt is calling to info her PCP that her ( LANTUS )  is also coming up as over a $150 .Marland Kitchen She is wondering if  [facility] maybe it will be cheaper ... I unstructured her to give [place] a call back if the price does not go down .Marland KitchenMarland Kitchen

## 2023-12-10 ENCOUNTER — Other Ambulatory Visit: Payer: Self-pay | Admitting: Internal Medicine

## 2023-12-10 DIAGNOSIS — G8929 Other chronic pain: Secondary | ICD-10-CM

## 2023-12-11 ENCOUNTER — Telehealth: Payer: Self-pay

## 2023-12-11 NOTE — Telephone Encounter (Signed)
 Prior Authorization for patient (Pregabalin  50MG  capsules) came through on cover my meds was submitted with last office notes awaiting approval or denial.  EGB:[id number]

## 2023-12-12 NOTE — Telephone Encounter (Signed)
 Decision:Approved  KAREN PARSONS (KeyBETHA PALIN) PA Case ID #: [id number] Rx #: [id number] Need Help? Call us  at [phone] Archived today by you Outcome Approved on February 5 by [employer] Better 2017 [employer]K Approved. Approved for PREGABALIN  Capsule 50MG , quantity up to 90 per 30 days, under the pharmacy benefit. The drug has been approved from 12/11/2023 to 12/10/2024. Generic or biosimilar substitution may be required when available and preferred on the formulary. Please note that dispensing of non-maintenance and specialty medications may be limited to a monthly supply. Authorization Expiration Date: 12/10/2024 Drug Pregabalin  50MG  capsules ePA cloud logo Form [employer] Electronic Prior Authorization Form 2017 NCPDP Original Claim Info 75 Prior Authorization Required

## 2023-12-16 ENCOUNTER — Telehealth: Payer: Self-pay

## 2023-12-16 ENCOUNTER — Other Ambulatory Visit: Payer: Self-pay

## 2023-12-16 DIAGNOSIS — M545 Low back pain, unspecified: Secondary | ICD-10-CM

## 2023-12-16 DIAGNOSIS — G8929 Other chronic pain: Secondary | ICD-10-CM

## 2023-12-16 NOTE — Telephone Encounter (Signed)
 [facility] requesting [facility] refill for pt ... He then expressed that pt insurance changed nd she is not happy about her co pay .Aaron Aas He stated he explain to her that the co pay is set by her insurance  due to how much her deductible is and if she has meant it .Aaron Aas He stated he explained pt is to call her insurance an speak to them about her deductible  due to she stated she was going to call her providers  office .Aaron Aas

## 2023-12-17 ENCOUNTER — Other Ambulatory Visit: Payer: Self-pay | Admitting: Internal Medicine

## 2023-12-17 DIAGNOSIS — G8929 Other chronic pain: Secondary | ICD-10-CM

## 2023-12-19 ENCOUNTER — Other Ambulatory Visit: Payer: Self-pay

## 2023-12-19 DIAGNOSIS — G8929 Other chronic pain: Secondary | ICD-10-CM

## 2023-12-19 MED ORDER — LIDOCAINE 5 % EX PTCH: 1.0000 | MEDICATED_PATCH | Freq: Two times a day (BID) | CUTANEOUS | 0 refills | Status: DC

## 2023-12-19 MED ORDER — CYCLOBENZAPRINE HCL 10 MG PO TABS: 10.0000 mg | ORAL_TABLET | Freq: Three times a day (TID) | ORAL | 0 refills | Status: DC | PRN

## 2023-12-19 NOTE — Telephone Encounter (Signed)
Refilled one week ago

## 2023-12-20 NOTE — Telephone Encounter (Signed)
Medication refilled one week ago.

## 2023-12-27 ENCOUNTER — Other Ambulatory Visit: Payer: Self-pay | Admitting: Internal Medicine

## 2023-12-27 DIAGNOSIS — M545 Low back pain, unspecified: Secondary | ICD-10-CM

## 2023-12-30 NOTE — Telephone Encounter (Signed)
Refill request to soon.

## 2024-01-02 ENCOUNTER — Other Ambulatory Visit: Payer: Self-pay | Admitting: Internal Medicine

## 2024-01-02 DIAGNOSIS — G8929 Other chronic pain: Secondary | ICD-10-CM

## 2024-01-15 ENCOUNTER — Other Ambulatory Visit: Payer: Self-pay

## 2024-01-15 ENCOUNTER — Encounter (HOSPITAL_COMMUNITY): Payer: Self-pay | Admitting: *Deleted

## 2024-01-15 ENCOUNTER — Emergency Department (HOSPITAL_COMMUNITY)
Admission: EM | Admit: 2024-01-15 | Discharge: 2024-01-15 | Disposition: A | Discharge status: Home / Self Care | Payer: MEDICAID

## 2024-01-15 DIAGNOSIS — R030 Elevated blood-pressure reading, without diagnosis of hypertension: Secondary | ICD-10-CM

## 2024-01-15 DIAGNOSIS — Z79899 Other long term (current) drug therapy: Secondary | ICD-10-CM | POA: Diagnosis not present

## 2024-01-15 DIAGNOSIS — E1165 Type 2 diabetes mellitus with hyperglycemia: Secondary | ICD-10-CM | POA: Insufficient documentation

## 2024-01-15 DIAGNOSIS — Z7984 Long term (current) use of oral hypoglycemic drugs: Secondary | ICD-10-CM | POA: Insufficient documentation

## 2024-01-15 DIAGNOSIS — Z7982 Long term (current) use of aspirin: Secondary | ICD-10-CM | POA: Insufficient documentation

## 2024-01-15 DIAGNOSIS — Z794 Long term (current) use of insulin: Secondary | ICD-10-CM | POA: Insufficient documentation

## 2024-01-15 DIAGNOSIS — I10 Essential (primary) hypertension: Secondary | ICD-10-CM | POA: Diagnosis not present

## 2024-01-15 DIAGNOSIS — R739 Hyperglycemia, unspecified: Secondary | ICD-10-CM | POA: Diagnosis present

## 2024-01-15 LAB — URINALYSIS, ROUTINE W REFLEX MICROSCOPIC
Bilirubin Urine: NEGATIVE
Glucose, UA: NEGATIVE mg/dL
Hgb urine dipstick: NEGATIVE
Ketones, ur: NEGATIVE mg/dL
Leukocytes,Ua: NEGATIVE
Nitrite: NEGATIVE
Protein, ur: NEGATIVE mg/dL
Specific Gravity, Urine: 1.012 (ref 1.005–1.030)
pH: 6 (ref 5.0–8.0)

## 2024-01-15 LAB — CBC WITH DIFFERENTIAL/PLATELET
Abs Immature Granulocytes: 0.03 10*3/uL (ref 0.00–0.07)
Basophils Absolute: 0.1 10*3/uL (ref 0.0–0.1)
Basophils Relative: 1 %
Eosinophils Absolute: 0.2 10*3/uL (ref 0.0–0.5)
Eosinophils Relative: 3 %
HCT: 44.9 % (ref 36.0–46.0)
Hemoglobin: 14.7 g/dL (ref 12.0–15.0)
Immature Granulocytes: 0 %
Lymphocytes Relative: 31 %
Lymphs Abs: 2.8 10*3/uL (ref 0.7–4.0)
MCH: 30.4 pg (ref 26.0–34.0)
MCHC: 32.7 g/dL (ref 30.0–36.0)
MCV: 93 fL (ref 80.0–100.0)
Monocytes Absolute: 0.6 10*3/uL (ref 0.1–1.0)
Monocytes Relative: 6 %
Neutro Abs: 5.3 10*3/uL (ref 1.7–7.7)
Neutrophils Relative %: 59 %
Platelets: 223 10*3/uL (ref 150–400)
RBC: 4.83 MIL/uL (ref 3.87–5.11)
RDW: 13.5 % (ref 11.5–15.5)
WBC: 8.9 10*3/uL (ref 4.0–10.5)
nRBC: 0 % (ref 0.0–0.2)

## 2024-01-15 LAB — BASIC METABOLIC PANEL
Anion gap: 8 (ref 5–15)
BUN: 12 mg/dL (ref 6–20)
CO2: 27 mmol/L (ref 22–32)
Calcium: 9.4 mg/dL (ref 8.9–10.3)
Chloride: 104 mmol/L (ref 98–111)
Creatinine, Ser: 0.89 mg/dL (ref 0.44–1.00)
GFR, Estimated: >60 mL/min (ref >60)
Glucose, Bld: 145 mg/dL — ABNORMAL HIGH (ref 70–99)
Potassium: 4.1 mmol/L (ref 3.5–5.1)
Sodium: 139 mmol/L (ref 135–145)

## 2024-01-15 LAB — CBG MONITORING, ED: Glucose-Capillary: 181 mg/dL — ABNORMAL HIGH (ref 70–99)

## 2024-01-15 NOTE — Discharge Instructions (Signed)
 Your urine did not show any evidence of an infection. You will need to follow-up with your primary care physician about your blood sugar and your blood pressure.  There does not appear to be any emergency today.  Return if you have any new or worsening emergency conditions

## 2024-01-15 NOTE — ED Provider Triage Note (Signed)
 Emergency Medicine Provider Triage Evaluation Note  Trenise Turay , a 53 y.o. female  was evaluated in triage.  Pt complains of HTN and hyperglycemia.  Review of Systems  Positive:  Negative:   Physical Exam  BP (!) 140/101 (BP Location: Right Arm)   Pulse 100   Temp 98.3 F (36.8 C)   Resp 16   Wt 123.8 kg   SpO2 96%   BMI 51.58 kg/m  Gen:   Awake, no distress   Resp:  Normal effort  MSK:   Moves extremities without difficulty  Other:    Medical Decision Making  Medically screening exam initiated at 1:41 PM.  Appropriate orders placed.  Baleria Evet Dukes-Enaiho was informed that the remainder of the evaluation will be completed by another provider, this initial triage assessment does not replace that evaluation, and the importance of remaining in the ED until their evaluation is complete.  Patient here for elevated CBG (300's) and HTN (SBP 170's) at PCP appointment. Patient stating that she currently feels a little lightheaded which has been happening for a couple of weeks and may be d/t a recent change in DM medications. Patient also stating that she gets SOB when she gets "fluids on her lungs" but this symptom has not happened for a couple of days. Patient also stating that she has had blurry vision which resolved after getting new contacts last week.    Dorthy Cooler, [place] 01/15/24 1354

## 2024-01-15 NOTE — ED Notes (Signed)
 PT taken to bathroom in wheelchair, urine specimen collected.

## 2024-01-15 NOTE — ED Triage Notes (Signed)
 Pt states that she had a home visit by a nurse who checked her CBG and it was 300 and her BP was 170 systolic.  Pt states that she is taking all her medications as prescribed.  No CP or sob.  Pt does reports HA since this am.

## 2024-01-15 NOTE — ED Provider Notes (Signed)
 [place] [facility] AT [facility] Provider Note   CSN: [id number] Arrival date & time: 01/15/24  1323     History  Chief Complaint  Patient presents with   Hypertension   Hyperglycemia    Shamicka Inga is a 53 y.o. female presents to the ED after home nurse saw that her blood sugar was around 300 and BP elevated. She states it's usually in the 100s. She endorses fatigue, headache, nasal congestion and burning with urination x3-4 days. She says she has not noticed any change to urinary frequency but states that she can't tell d/t her lasix. No other  complaints at this time   Hypertension  Hyperglycemia      Home Medications Prior to Admission medications   Medication Sig Start Date End Date Taking? Authorizing Provider  acetaminophen (TYLENOL) 325 MG tablet Take 650 mg by mouth 2 (two) times daily as needed for fever, headache or moderate pain.    [provider]  albuterol (VENTOLIN HFA) 108 (90 Base) MCG/ACT inhaler INHALE 2 PUFFS INTO THE LUNGS EVERY FOUR HOURS AS NEEDED FOR WHEEZING OR SHORTNESS OF BREATH. 11/11/23 11/10/24  Masters, Katie, DO  aspirin EC 81 MG tablet Take 1 tablet (81 mg total) by mouth daily. Swallow whole. 11/11/23 11/10/24  Masters, Katie, DO  atorvastatin (LIPITOR) 40 MG tablet TAKE 1 TABLET (40 MG TOTAL) BY MOUTH DAILY. 11/11/23 11/10/24  Masters, Katie, DO  cyclobenzaprine (FLEXERIL) 10 MG tablet Take 1 tablet (10 mg total) by mouth 3 (three) times daily as needed for muscle spasms. 12/19/23   Masters, Katie, DO  diclofenac Sodium (VOLTAREN) 1 % GEL Apply 1 Application topically 4 (four) times daily as needed (pain). 11/11/23   Masters, Katie, DO  DULoxetine (CYMBALTA) 30 MG capsule Take 1 capsule (30 mg total) by mouth daily. 11/11/23 11/10/24  Masters, Katie, DO  empagliflozin (JARDIANCE) 10 MG TABS tablet Take 1 tablet (10 mg total) by mouth daily before breakfast. 11/11/23   Masters, Florentina Addison, DO  Ferrous Sulfate (IRON PO)  Take 1 tablet by mouth daily.    [provider]  fluticasone (FLONASE) 50 MCG/ACT nasal spray PLACE 2 SPRAYS INTO BOTH NOSTRILS DAILY. Patient taking differently: Place 2 sprays into both nostrils daily as needed for allergies. 11/30/20 06/15/23  Marguerita Merles Latif, DO  furosemide (LASIX) 40 MG tablet Take 1 tablet (40 mg total) by mouth daily. 11/11/23 11/10/24  Masters, Katie, DO  hydrOXYzine (ATARAX) 50 MG tablet Take 1 tablet (50 mg total) by mouth every 6 (six) hours as needed for anxiety. 11/11/23   Masters, Katie, DO  insulin glargine (LANTUS) 100 unit/mL SOPN Inject 15 Units into the skin daily. 11/29/23   Masters, Katie, DO  lidocaine (LIDODERM) 5 % Place 1 patch onto the skin every 12 (twelve) hours. Remove & Discard patch within 12 hours or as directed by MD 12/19/23 12/18/24  Masters, Florentina Addison, DO  liraglutide (VICTOZA) 18 MG/3ML SOPN Inject 1.2 mg into the skin daily. 11/22/23   Modena Slater, DO  loratadine (CLARITIN REDITABS) 10 MG dissolvable tablet Take 1 tablet (10 mg total) by mouth daily. 11/11/23   Masters, Katie, DO  melatonin 3 MG TABS tablet Take 2 tablets (6 mg total) by mouth at bedtime. 11/29/21   Bobbye Morton, MD  mometasone-formoterol (DULERA) 100-5 MCG/ACT AERO Inhale 2 puffs into the lungs 2 (two) times daily. 11/11/23 11/10/24  Masters, Katie, DO  montelukast (SINGULAIR) 10 MG tablet Take 1 tablet (10 mg total) by mouth  at bedtime. 11/11/23 11/10/24  Masters, Katie, DO  Multiple Vitamin (MULTIVITAMIN WITH MINERALS) TABS tablet Take 1 tablet by mouth daily. 11/30/20   Sheikh, Kateri Mc Latif, DO  nicotine (NICODERM CQ - DOSED IN MG/24 HOURS) 14 mg/24hr patch Place 1 patch (14 mg total) onto the skin daily. 11/12/23   Masters, Katie, DO  olmesartan (BENICAR) 20 MG tablet Take 1 tablet (20 mg total) by mouth daily. 11/11/23   Masters, Katie, DO  Polyvinyl Alcohol-Povidone (REFRESH OP) Place 1 drop into both eyes 2 (two) times daily as needed (dryness).    [provider]  pregabalin  (LYRICA) 50 MG capsule Take 1 capsule (50 mg total) by mouth 3 (three) times daily. 11/11/23   Masters, Katie, DO  tiotropium (SPIRIVA) 18 MCG inhalation capsule Place 1 capsule (18 mcg total) into inhaler and inhale daily. 11/11/23   Masters, Katie, DO  traMADol (ULTRAM) 50 MG tablet TAKE 2 TABLETS (100 MG TOTAL) BY MOUTH EVERY 12 (TWELVE) HOURS AS NEEDED. 12/11/23   Masters, Florentina Addison, DO  traZODone (DESYREL) 50 MG tablet Take 1 tablet (50 mg total) by mouth at bedtime. 11/11/23   Masters, Florentina Addison, DO      Allergies    Nsaids    Review of Systems   Review of Systems  Physical Exam Updated Vital Signs BP 121/81 (BP Location: Right Arm)   Pulse 94   Temp 98.3 F (36.8 C) (Oral)   Resp (!) 26   Wt 123.8 kg   SpO2 100%   BMI 51.58 kg/m  Physical Exam Vitals and nursing note reviewed.  Constitutional:      General: She is not in acute distress.    Appearance: She is well-developed. She is not diaphoretic.  HENT:     Head: Normocephalic and atraumatic.     Right Ear: External ear normal.     Left Ear: External ear normal.     Nose: Nose normal.     Mouth/Throat:     Mouth: Mucous membranes are moist.  Eyes:     General: No scleral icterus.    Conjunctiva/sclera: Conjunctivae normal.  Cardiovascular:     Rate and Rhythm: Normal rate and regular rhythm.     Heart sounds: Normal heart sounds. No murmur heard.    No friction rub. No gallop.  Pulmonary:     Effort: Pulmonary effort is normal. No respiratory distress.     Breath sounds: Normal breath sounds.  Abdominal:     General: Bowel sounds are normal. There is no distension.     Palpations: Abdomen is soft. There is no mass.     Tenderness: There is no abdominal tenderness. There is no guarding.  Musculoskeletal:     Cervical back: Normal range of motion.  Skin:    General: Skin is warm and dry.  Neurological:     Mental Status: She is alert and oriented to person, place, and time.  Psychiatric:        Behavior: Behavior normal.     ED Results / Procedures / Treatments   Labs (all labs ordered are listed, but only abnormal results are displayed) Labs Reviewed  BASIC METABOLIC PANEL - Abnormal; Notable for the following components:      Result Value   Glucose, Bld 145 (*)    All other components within normal limits  CBG MONITORING, ED - Abnormal; Notable for the following components:   Glucose-Capillary 181 (*)    All other components within normal limits  CBC WITH DIFFERENTIAL/PLATELET  URINALYSIS, ROUTINE W REFLEX MICROSCOPIC    EKG None  Radiology No results found.  Procedures Procedures    Medications Ordered in ED Medications - No data to display  ED Course/ Medical Decision Making/ A&P Clinical Course as of 01/15/24 1745  Wed Jan 15, 2024  1729 Glucose(!): 145 [AH]    Clinical Course User Index [AH] Arthor Captain, PA-C                                 Medical Decision Making Amount and/or Complexity of Data Reviewed Labs: ordered. Decision-making details documented in ED Course.   Here with elevated blood pressure and blood sugar.  I reviewed the patient's labs.  Glucose 145.  Blood pressure was 140/101 earlier is now 123/80.  Is clear, the remainder of the patient's workup is totally normal.  Patient can follow-up in the outpatient setting with her PCP return precautions given        Final Clinical Impression(s) / ED Diagnoses Final diagnoses:  None    Rx / DC Orders ED Discharge Orders     None         Arthor Captain, PA-C 01/15/24 2052    Coral Spikes, DO 01/15/24 2311

## 2024-02-18 ENCOUNTER — Ambulatory Visit: Payer: Self-pay

## 2024-02-18 DIAGNOSIS — G4733 Obstructive sleep apnea (adult) (pediatric): Secondary | ICD-10-CM

## 2024-02-18 NOTE — Telephone Encounter (Signed)
  Chief Complaint: info only Additional Notes: Pt calling with CPAP machine issues. Pt reports machine will not turn on. Has attempted to troubleshoot independently without success. Triager advised to call [facility] to further assist with troubleshooting and to call back if needing new machine. Patient verbalized understanding.    Copied from CRM [id number]. Topic: Clinical - Red Word Triage >> Feb 18, 2024  3:08 PM Crist Dominion wrote: Red Word that prompted transfer to Nurse Triage: CPAP machine malfunction. Reason for Disposition  General information question, no triage required and triager able to answer question  Answer Assessment - Initial Assessment Questions 1. REASON FOR CALL or QUESTION: "What is your reason for calling today?" or "How can I best help you?" or "What question do you have that I can help answer?"     CPAP machine issues - "it just won't turn on". Pt confirmed machine is plugged in and has manually held the power button to see if it would restart without success.  Protocols used: Information Only Call - No Triage-A-AH

## 2024-02-19 ENCOUNTER — Ambulatory Visit: Payer: Self-pay

## 2024-02-19 DIAGNOSIS — G4733 Obstructive sleep apnea (adult) (pediatric): Secondary | ICD-10-CM

## 2024-02-19 NOTE — Telephone Encounter (Signed)
 ATC X1. VM not set up yet

## 2024-02-19 NOTE — Telephone Encounter (Signed)
 Spoke with the pt  She is asking for order to [facility] for replacement of her BIPAP supplies  BIPAP was started when she was in the hospital Dr Waylan Haggard, please advise if okay to send order, thanks!

## 2024-02-19 NOTE — Telephone Encounter (Signed)
  Chief Complaint: info only Additional Notes: Pt calling requesting needing new mask and hose for her CPAP machine. Triager will forward encounter for Dr. Waylan Haggard to advise. Patient verbalized understanding and is expecting call back from office for next steps.    Copied from CRM [id number]. Topic: Clinical - Prescription Issue >> Feb 19, 2024 12:18 PM Cathy Hall wrote: Reason for CRM: Additional Notes: Pt calling with CPAP machine issues. Pt reports machine will not turn on. Has attempted to troubleshoot independently without success. Triager advised to call [facility] to further assist with troubleshooting and to call back if needing new machine. Patient verbalized understanding. Reason for Disposition  [1] Caller requesting NON-URGENT health information AND [2] PCP's office is the best resource  Answer Assessment - Initial Assessment Questions 1. REASON FOR CALL or QUESTION: "What is your reason for calling today?" or "How can I best help you?" or "What question do you have that I can help answer?"     Pt reporting that CPAP machine is now working, but pt reports needing new mask and hose.  Protocols used: Information Only Call - No Triage-A-AH

## 2024-02-20 NOTE — Addendum Note (Signed)
 Addended by: Adryel Wortmann T on: 02/20/2024 01:47 PM   Modules accepted: Orders

## 2024-02-20 NOTE — Telephone Encounter (Signed)
 I called and spoke to pt. Pt states her machine is working, but she needs new supplies. I checked pts encounters, and it looks like pt was suppose to be on Bipap per lov on 05-27-23 with Dr Waylan Haggard, Pt states she uses a CPAP, not a Bipap. Pt states she gets her machine from [facility]. I informed pt I could send supplies in for her. Pt verbalized understanding. NFN

## 2024-02-24 NOTE — Telephone Encounter (Addendum)
 Bipap supplies has been ordered.  CPAP titration is pending.  Dr. Waylan Haggard, please verify if you want cpap or bipap titration? Thanks

## 2024-02-24 NOTE — Telephone Encounter (Signed)
 Okay to send order to [facility] for replacement of BiPAP supplies On review of chart it appears that she also will need overnight titration study.  Please order this to be done before her return visit later this year.

## 2024-02-24 NOTE — Addendum Note (Signed)
 Addended by: Katie Parks A on: 02/24/2024 03:17 PM   Modules accepted: Orders

## 2024-02-25 NOTE — Telephone Encounter (Signed)
 Bipap titration has been ordered.  Pt is aware.  Nothing further needed.

## 2024-02-25 NOTE — Addendum Note (Signed)
 Addended by: Katie Parks A on: 02/25/2024 11:32 AM   Modules accepted: Orders

## 2024-02-25 NOTE — Telephone Encounter (Signed)
 She will need bipap titration

## 2024-02-27 ENCOUNTER — Ambulatory Visit: Payer: Self-pay | Admitting: Student

## 2024-02-27 ENCOUNTER — Encounter: Payer: Self-pay | Admitting: Student

## 2024-02-27 VITALS — BP 124/56 | HR 99 | Temp 98.4°F | Ht 61.0 in | Wt 283.2 lb

## 2024-02-27 DIAGNOSIS — Z1211 Encounter for screening for malignant neoplasm of colon: Secondary | ICD-10-CM

## 2024-02-27 DIAGNOSIS — Z794 Long term (current) use of insulin: Secondary | ICD-10-CM | POA: Diagnosis not present

## 2024-02-27 DIAGNOSIS — E66813 Obesity, class 3: Secondary | ICD-10-CM

## 2024-02-27 DIAGNOSIS — Z23 Encounter for immunization: Secondary | ICD-10-CM | POA: Diagnosis not present

## 2024-02-27 DIAGNOSIS — E662 Morbid (severe) obesity with alveolar hypoventilation: Secondary | ICD-10-CM | POA: Diagnosis not present

## 2024-02-27 DIAGNOSIS — I10 Essential (primary) hypertension: Secondary | ICD-10-CM

## 2024-02-27 DIAGNOSIS — Z6841 Body Mass Index (BMI) 40.0 and over, adult: Secondary | ICD-10-CM

## 2024-02-27 DIAGNOSIS — G8929 Other chronic pain: Secondary | ICD-10-CM

## 2024-02-27 DIAGNOSIS — Z0000 Encounter for general adult medical examination without abnormal findings: Secondary | ICD-10-CM

## 2024-02-27 DIAGNOSIS — E118 Type 2 diabetes mellitus with unspecified complications: Secondary | ICD-10-CM | POA: Diagnosis not present

## 2024-02-27 DIAGNOSIS — Z8673 Personal history of transient ischemic attack (TIA), and cerebral infarction without residual deficits: Secondary | ICD-10-CM

## 2024-02-27 DIAGNOSIS — F17210 Nicotine dependence, cigarettes, uncomplicated: Secondary | ICD-10-CM

## 2024-02-27 LAB — POCT GLYCOSYLATED HEMOGLOBIN (HGB A1C): Hemoglobin A1C: 8.2 % — AB (ref 4.0–5.6)

## 2024-02-27 LAB — GLUCOSE, CAPILLARY: Glucose-Capillary: 222 mg/dL — ABNORMAL HIGH (ref 70–99)

## 2024-02-27 MED ORDER — EMPAGLIFLOZIN 10 MG PO TABS
10.0000 mg | ORAL_TABLET | Freq: Every day | ORAL | 3 refills | Status: AC
Start: 2024-02-27 — End: ?
  Filled 2024-06-29: qty 90, 90d supply, fill #0
  Filled 2024-10-02: qty 90, 90d supply, fill #1

## 2024-02-27 MED ORDER — TIRZEPATIDE 2.5 MG/0.5ML ~~LOC~~ SOAJ: 2.5000 mg | SUBCUTANEOUS | 11 refills | Status: DC

## 2024-02-27 NOTE — Assessment & Plan Note (Signed)
 Has had difficulty with PAP hose and mask. Communicated with pulmonology about it. Gave her [facility] information to inquire. Per review, patient may need titration study. I see she has appt with [place] in July.

## 2024-02-27 NOTE — Assessment & Plan Note (Signed)
 Recently seen in the ED for elevated blood pressure with HA. Patient has been adherent to current regimen, which she receives via pill pockets from [facility]  mail order pharmacy. Currently on Olmesartan  20 mg daily and furosemide  40 mg daily. No evidence of volume overload today.  Reviewed ED BMP with renal function within reference and normal electrolytes. - Continue current management

## 2024-02-27 NOTE — Assessment & Plan Note (Signed)
 Ambulatory referral to gastroenterology for colonoscopy Pneumococcal vaccine administered today

## 2024-02-27 NOTE — Assessment & Plan Note (Signed)
 In the action state. Does not want to start a new medication but open to nicotine  gum. Has patches. Currently smoking 7 cigarettes per day. Discussed importance of quitting and provided resources for [place] Quit Now campaign
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
## 2024-02-27 NOTE — Patient Instructions (Addendum)
 Diabetes and weight control - We are changing your Victoza  to a medication called Mounjaro  You should inject 2.5 mg weekly. Please stay hydrated and eat smaller meals while you are on this medication. Monitor yourself for signs of: -Nausea, vomiting, bloating, diarrhea, or abdominal discomfort.  -Most of the effects should get better as you continue using this medication -If it does not improve in 2-3 days or you have poor oral intake, experience lightheadedness, or feel sick, please STOP the medication and call our office.   If you experience no side effects/tolerate this therapy, we may be able to increase your dose at the next follow up visit   FOR YOUR CPAP SUPPLIES [facility] phone number T:  [phone]  Also call Dr. Isabel Many office (Lung doctor) to see if you need a new sleep study   Smoking Quitline [employer] [facility]  Telephone Numbers:  1-(800) QUIT-NOW (English/Spanish) 1-(855) DEJELO-YA (Spanish)  Who is eligible for counseling? Any [place]  resident. Uninsured residents, Medicaid and Medicare insured residents will receive up to 4 proactive coaching sessions. All commercially insured residents will be limited to one coaching session. Options: -Single-session -Multi-session (client-initiated) -Multi-session (counselor-initiated) -Text message to cell phone (two-way)  In these counseling sessions, you may cover tobacco history, developing a quit plan, setting a quite date, withdrawal symptoms, relapse prevention, weight gain, use of cessation medication, stress management, and other. The length of a standard first session is 30 minutes. The length of a standard follow-up session is 15 minutes.  Who can receive medication? Medicaid & Medicare insured or uninsured enrolled in multicall program 52 years of age and older  Free medications that may be provided include nicotine  gum, lozenge, and patch. These will be distributed by mail.  Additional Info: Anyone  regardless of insurance can enroll in Pregnancy program (10 calls  or Behavioral health program (7 calls plus 12 week combination therapy).  Quitline[place].com (DietDisorder.fr)    *See [employer] Teams for "Quit Plan" and "Quit Smoking Prescription" resources for patients*

## 2024-02-27 NOTE — Assessment & Plan Note (Signed)
 Discussed nutrition plan, continues movement practices, and adherence to GLP-1 medication. Patient is motivated and adherent to her therapies.

## 2024-02-27 NOTE — Assessment & Plan Note (Addendum)
 Last A1c:  Lab Results  Component Value Date   HGBA1C 8.2 (A) 02/27/2024  Patient continues to work on dietary modifications to help her diabetes and improve weight.  Blood sugar readings in the AM mildly elevated. Patient did not bring glucometer with her. She is currently taking all medications as she receives them from mail order pharmacy. It seems that Patient had not been receiving Jardiance  on pill pockets from [facility] in [place] .  Plan: Continue: Tresiba  15 units daily Atorvastatin  40 mg daily Will reorder Jardiance  10 mg daily Victoza  1.2 mg transitioned to Tizerpatide 2.5 mg weekly Urine albumin Cr ratio today

## 2024-02-28 LAB — MICROALBUMIN / CREATININE URINE RATIO
Creatinine, Urine: 68.8 mg/dL
Microalb/Creat Ratio: <4 mg/g{creat} (ref 0–29)
Microalbumin, Urine: <3 ug/mL

## 2024-03-01 NOTE — Progress Notes (Signed)
 Internal Medicine Clinic Attending  Case discussed with the resident at the time of the visit.  We reviewed the resident's history and exam and pertinent patient test results.  I agree with the assessment, diagnosis, and plan of care documented in the resident's note.

## 2024-03-02 ENCOUNTER — Telehealth: Payer: Self-pay | Admitting: *Deleted

## 2024-03-02 NOTE — Telephone Encounter (Signed)
 I called pt who's requesting medication for her stomach. Stated she forgot to tell the doctor she has been taking Prilosec. Telehealth appt has been schedule on 4/29 w/ Dr Sharlon Deacon. Also stated Mounjaro  needs a PA - sending to Jada E.

## 2024-03-02 NOTE — Telephone Encounter (Signed)
 Copied from CRM [id number]. Topic: Clinical - Medication Question >> Mar 02, 2024  1:20 PM Shamecia H wrote: Reason for CRM: Patient is calling in because you ned something to help coat your stomach patient said she drunk round up and the only thing that helped with the burning in her stomach is Prilosec or something similar to that and if so can you seen it to [facility] pharmacy, patient also has some questions about Mounjaro  she is wanting an update on it. Patients callback number is [phone].

## 2024-03-03 ENCOUNTER — Telehealth: Payer: Self-pay

## 2024-03-03 ENCOUNTER — Ambulatory Visit (INDEPENDENT_AMBULATORY_CARE_PROVIDER_SITE_OTHER): Payer: MEDICAID | Admitting: Student

## 2024-03-03 ENCOUNTER — Other Ambulatory Visit (HOSPITAL_COMMUNITY): Payer: Self-pay

## 2024-03-03 DIAGNOSIS — Z79899 Other long term (current) drug therapy: Secondary | ICD-10-CM

## 2024-03-03 NOTE — Progress Notes (Unsigned)
  [facility] Internal Medicine Residency Telephone Encounter Continuity Care Appointment  HPI:  This telephone encounter was created for [name] on 03/03/2024 for the following purpose/cc questions regarding her medications.   Past Medical History:  Past Medical History:  Diagnosis Date   Anxiety    Asthma    COPD (chronic obstructive pulmonary disease) (HCC)    CVA (cerebral vascular accident) (HCC)    Depression    Heart failure with preserved ejection fraction (HCC)    Hypertension    Major depression    Obesity hypoventilation syndrome (HCC)    Polysubstance abuse (HCC)    PTSD (post-traumatic stress disorder)      Assessment / Plan / Recommendations:  Patient requested confirmation of Lasix  dose (she was seen in the clinic on 02/27/2024 with euvolemic documented) and was unsure if there was a dose change made. I confirmed that she is to remain on Lasix  40mg  daily and provided her return precautions if she were to have fluid overload symptoms. Additionally, she asked to be notified of her Mounjaro  status: which is still in process. Patient asked us  to call when the medication is authorized.  As always, pt is advised that if symptoms worsen or new symptoms arise, they should go to an urgent care facility or to to ER for further evaluation.   Consent and Medical Decision Making:  Patient discussed with Dr.  Ancil Balzarine This is a telephone encounter between [facility] and Aurora Lees on 03/03/2024 for questions regarding medications. The visit was conducted with the patient located at home and Aurora Lees at [facility] The patient's identity was confirmed using their DOB and current address. The patient has consented to being evaluated through a telephone encounter and understands the associated risks (an examination cannot be done and the patient may need to come in for an appointment) / benefits (allows the patient to remain at home, decreasing exposure to  coronavirus). I personally spent 5 minutes on medical discussion.

## 2024-03-03 NOTE — Telephone Encounter (Signed)
 Pharmacy Patient Advocate Encounter   Received notification from Physician's Office that prior authorization for MOUNJARO  2.5MG  is required/requested.   Insurance verification completed.   The patient is insured through [employer] .   Per test claim: PA required; PA submitted to above mentioned insurance via CoverMyMeds Key/confirmation #/EOC [id number]. Status is pending

## 2024-03-04 ENCOUNTER — Other Ambulatory Visit (HOSPITAL_COMMUNITY): Payer: Self-pay

## 2024-03-04 DIAGNOSIS — Z79899 Other long term (current) drug therapy: Secondary | ICD-10-CM | POA: Insufficient documentation

## 2024-03-04 NOTE — Telephone Encounter (Signed)
 Attempted to call the patient to let her know that her prior authorization for mounjaro  has been approved. Unable to reach the patient, unable to lvm,her vm has not been set up.

## 2024-03-04 NOTE — Telephone Encounter (Signed)
 Pharmacy Patient Advocate Encounter  Received notification from [facility] that Prior Authorization for MOUNJARO  2.5MG  has been APPROVED from 03/03/24 to 03/03/25

## 2024-03-04 NOTE — Assessment & Plan Note (Signed)
 Patient requested confirmation of Lasix  dose (she was seen in the clinic on 02/27/2024 and was euvolemic) and was unsure if there was a dose change made. I confirmed that she is to remain on Lasix  40mg  daily and provided her return precautions if she were to have fluid overload symptoms. Additionally, she asked to be notified of her Mounjaro  status: which is still in process. Patient asked us  to call when the medication is authorized.

## 2024-03-06 ENCOUNTER — Other Ambulatory Visit: Payer: Self-pay | Admitting: Internal Medicine

## 2024-03-06 DIAGNOSIS — Z794 Long term (current) use of insulin: Secondary | ICD-10-CM

## 2024-03-06 NOTE — Progress Notes (Signed)
 Internal Medicine Clinic Attending  Case discussed with the resident at the time of the visit.  We reviewed the resident's history and exam and pertinent patient test results.  I agree with the assessment, diagnosis, and plan of care documented in the resident's note.

## 2024-03-06 NOTE — Telephone Encounter (Signed)
 Copied from CRM [id number]. Topic: Clinical - Medication Refill >> Mar 06, 2024 12:35 PM Brynn Caras wrote: Most Recent Primary Care Visit:  Provider: Cathey Clunes  Department: [facility]  Visit Type: OFFICE VISIT  Date: 02/27/2024  Medication: tirzepatide  (MOUNJARO ) 2.5 MG/0.5ML Pen   Has the patient contacted their pharmacy? Yes (Agent: If no, request that the patient contact the pharmacy for the refill. If patient does not wish to contact the pharmacy document the reason why and proceed with request.) (Agent: If yes, when and what did the pharmacy advise?)  Is this the correct pharmacy for this prescription? Yes If no, delete pharmacy and type the correct one.  This is the patient's preferred pharmacy:  [place] [facility] [address] [place] [address] Phone: [phone] Fax: [phone]  [facility] [place]  - [place], [place] - [address] [place] [address] Phone: [phone] Fax: [phone]   Has the prescription been filled recently? No, PT needed PA, PA approved as of 04/30.  Is the patient out of the medication? Yes  Has the patient been seen for an appointment in the last year OR does the patient have an upcoming appointment? Yes  Can we respond through MyChart? No, callback preferred.  Agent: Please be advised that Rx refills may take up to 3 business days. We ask that you follow-up with your pharmacy.

## 2024-03-09 ENCOUNTER — Telehealth: Payer: Self-pay | Admitting: *Deleted

## 2024-03-09 ENCOUNTER — Other Ambulatory Visit (HOSPITAL_COMMUNITY): Payer: Self-pay

## 2024-03-09 ENCOUNTER — Other Ambulatory Visit: Payer: Self-pay

## 2024-03-09 DIAGNOSIS — E118 Type 2 diabetes mellitus with unspecified complications: Secondary | ICD-10-CM

## 2024-03-09 MED ORDER — TIRZEPATIDE 2.5 MG/0.5ML ~~LOC~~ SOAJ
2.5000 mg | SUBCUTANEOUS | 11 refills | Status: DC
  Filled 2024-03-09: qty 2, 28d supply, fill #0
  Filled 2024-04-03: qty 2, 28d supply, fill #1

## 2024-03-09 NOTE — Telephone Encounter (Signed)
 Called [facility] pharmacy,talked to Mellen. She stated Mounjaro  is not on pt's formulary or a PA is needed. Informed her PA was done 4/30 per Camille E and it was approved. She ran the rx thru - stated it's still being rejected; "not on formulary";stated it's due to pt's insurance.

## 2024-03-09 NOTE — Telephone Encounter (Signed)
 Copied from CRM [id number]. Topic: Clinical - Prescription Issue >> Mar 06, 2024 12:45 PM Adrianna P wrote: Reason for CRM: [facility] said that they do not have the full paperwork for tirzepatide  (MOUNJARO ) 2.5 MG/0.5ML Pen

## 2024-03-09 NOTE — Telephone Encounter (Signed)
 I called [facility] pharmacy and informed of Cathy Hall's response; "Cathy Hall had 2 active insurance plans. PA was approved through [employer]". Raeanne Bull stated they do not take [employer].  I called Cathy Hall - Cathy Hall stated to send rx to [facility] pharmacy.

## 2024-03-10 ENCOUNTER — Other Ambulatory Visit (HOSPITAL_COMMUNITY): Payer: Self-pay

## 2024-03-13 ENCOUNTER — Other Ambulatory Visit (HOSPITAL_COMMUNITY): Payer: Self-pay

## 2024-03-13 ENCOUNTER — Telehealth: Payer: Self-pay

## 2024-03-13 NOTE — Telephone Encounter (Signed)
 Prior Authorization for patient (Mounjaro  2.5MG /0.5ML auto-injectors) came through on cover my meds was submitted with last office notes and labs awaiting approval or denial.  KEY:[id number]

## 2024-03-13 NOTE — Telephone Encounter (Signed)
 MEDICATION REQUEST Date: Drug: Status: Tracking ID: Reviewer: 03/13/2024 MOUNJARO  Soln Auto-inj 2.5MG /0.5ML Denied [id number] Anjum Kumbkarni  Medical Director Notes: Per the health plan's guideline, HIM.PA.53 Glucagon-Like Peptide-1 (GLP-1) Receptor Agonists,  we are unable to approve the request for MOUNJARO  Soln Auto-inj 2.5MG /0.5ML due to unmet  criteria (5). See below for a complete list of all approval criteria required per the guideline. Please  send us  additional clinically supportive documentation for consideration.  1. Diagnosis of type 2 diabetes mellitus;  2. Request is for Adlyxin, Bydureon, Bydureon BCise, Byetta, or Mounjaro ;  3. Age is one of the following (a or b):  a. Bydureon BCise: at least 10 years;  b. Adlyxin, Byetta, Mounjaro : at least 18 years;  4. Member meets one of the following (a, b, c, d, or e):  a. Failure of at least 3 consecutive months of metformin  as evidenced by glycated  hemoglobin (HbA1c) at least 7%, unless contraindicated or clinically significant adverse effects  are experienced;  b. For antidiabetic medication-nave members, requested agent is approvable if intended  for concurrent use with metformin  due to HbA1c at least 8.5% (drawn within the past 3 months);  c. Request is for an agent with proven cardiovascular or renal benefit (Ozempic, Trulicity,  liraglutide  [(Victoza ])), and member has established atherosclerotic cardiovascular disease  (ASCVD), indicators of high ASCVD risk, heart failure with preserved ejection fraction, or chronic  kidney disease;  d. Member has metabolic dysfunction-associated steatotic liver disease (MASLD), and (i):  i. Member is overweight (body mass index [BMI] 25-29.9 kg/m2) or obese (BMI  at least 30 kg/m2);  e. Member has metabolic dysfunction-associated steatohepatitis (MASH), and (i):  i. Failure of at least 3 consecutive month trial of pioglitazone, unless  contraindicated or clinically significant  adverse effects are experienced;  5. Failure of all of the following, unless clinically significant adverse effects are experienced or all  are contraindicated (a and b):  a. at least 3 consecutive months of each of the following (i, ii, and iii):  i. Liraglutide  (Victoza );   Response to Prior Authorization Request Coverage Denial The information contained in this fax message is intended only for the confidential use of the designated recipients named above. This message may contain  protected health information (PHI) and as such is protected to the fullest extent of the law. If the reader of this message is not the intended recipient, or an agent  responsible for delivering it to the intended recipient, you are hereby notified that you have received this document in error, and that any review, dissemination,  distribution, or copying of this message is strictly prohibited. If you have received this communication in error, please notify our Call Center at [phone] and  destroy the original message. Thank You. Page 2 of 3 CPS Version 4.0 ii. Trulicity;  iii. Ozempic or Rybelsus;  b. Sodium-glucose co-transporter 2 (SGLT2) inhibitor, unless the member has MASLD or  MASH;  6. Requested product is not prescribed concurrently with another GLP-1 receptor agonist;  7. Dose does not exceed the food and drug administration (FDA)-approved maximum  recommended dose.    Note: Per the approval criteria above, the member must try the drug alternative(s) or tell us  why  they cannot. Here is a list of alternative drugs: Ozempic, Trulicity. For a complete list of  alternative drugs, please refer to the health plan's preferred drug list (PDL) located on the health  plan's website. Unless stated otherwise, please use generics when available.

## 2024-03-13 NOTE — Telephone Encounter (Signed)
 Aron Lard, you were able to get the PA approved, they are giving me a denial. Can you help assist with this?

## 2024-03-16 NOTE — Telephone Encounter (Signed)
Thank you Camille! 

## 2024-03-16 NOTE — Telephone Encounter (Signed)
 Additional information regarding the denial has been placed in the yellow team box.

## 2024-03-26 ENCOUNTER — Other Ambulatory Visit (HOSPITAL_COMMUNITY)
Admission: RE | Admit: 2024-03-26 | Discharge: 2024-03-26 | Disposition: A | Discharge status: Home / Self Care | Payer: MEDICAID | Source: Ambulatory Visit | Attending: Internal Medicine | Admitting: Internal Medicine

## 2024-03-26 ENCOUNTER — Other Ambulatory Visit: Payer: Self-pay

## 2024-03-26 ENCOUNTER — Ambulatory Visit: Payer: MEDICAID | Admitting: Student

## 2024-03-26 VITALS — BP 136/89 | HR 85 | Temp 98.6°F | Ht 61.0 in | Wt 280.0 lb

## 2024-03-26 DIAGNOSIS — N941 Unspecified dyspareunia: Secondary | ICD-10-CM | POA: Diagnosis not present

## 2024-03-26 DIAGNOSIS — Z0000 Encounter for general adult medical examination without abnormal findings: Secondary | ICD-10-CM

## 2024-03-26 DIAGNOSIS — Z124 Encounter for screening for malignant neoplasm of cervix: Secondary | ICD-10-CM | POA: Diagnosis present

## 2024-03-26 MED ORDER — ESTROGENS CONJUGATED 0.625 MG/GM VA CREA: TOPICAL_CREAM | VAGINAL | 12 refills | Status: DC
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
## 2024-03-26 NOTE — Patient Instructions (Addendum)
 Thank you, Cathy Hall for allowing us  to provide your care today.   Follow up: 2 months   Remember:   I am going to call you with the results of you pap.  Apply estrogen cream intravaginally twice a week    Let me know if you do not get your medications.   Tylenol  1000mg  every 6hours as needed for your knee pain  Voltaren  gel up to 4 times a day  Book an appointment to discuss knee pain.   Should you have any questions or concerns please call the internal medicine clinic at [phone]    Jose Ngo, MD [facility] Internal Medicine Center

## 2024-03-28 NOTE — Assessment & Plan Note (Addendum)
 Patient coming in for a pap smear. Denies any abnormal bleeding. States she has a Mirena and has break through bleeding every now and then. Her periods are still regular. She does state that recently she has been feeling some dryness and dyspareunia. No vaginal discharge. She has no concerns for STI. Does not know when her mom went through menopause, but is getting some night sweats now. I needed 3 packs of lube to insert the speculum for her pap smear. She also has a baseline incontinence that has improved since she has started losing weight. I think she is around perimenopause and wound benefit from having topical estrogen.   -Pap smear today  -start topical estrogen

## 2024-04-01 LAB — CYTOLOGY - PAP
Adequacy: ABSENT
Diagnosis: NEGATIVE

## 2024-04-01 NOTE — Telephone Encounter (Signed)
 This patient is Currently seeing [facility] in reference to Sleep Studies and her DME Equipment Please see Last Notes Below from their office and have the patient to reach out to their office.     Type Date User Summary Attachment  Provider Comments 02/24/2024  3:14 PM Janas Meadows, CMA Provider Comments -  Note: BIPAP SUPPLIES    Patient has OSA or probable OSA (Yes or No): yes Is the patient currently using BiPAP within the home? (Yes or No): yes If yes to question 2, what is the current DME provider? adapt BiPAP supplies needed: mask of choice, headgear, cushions, filters, climate control tubing and water  chamber. Heated humidity with all PAP supplies Length of Need: Lifetime     PT WILL NEED APPOINTMENT IN OFFICE WITH DME FOR BIPAP SET UP.    Copied from CRM [id number]. Topic: Referral - Question >> Mar 31, 2024  2:20 PM Carrielelia G wrote: Reason for CRM:  patient state she is waiting for her referral for a sleep study

## 2024-04-01 NOTE — Telephone Encounter (Signed)
 Duplicate message.  Please see prev notes.  Copied from CRM [id number]. Topic: Referral - Question >> Mar 31, 2024  2:20 PM Carrielelia G wrote: Reason for CRM:  patient state she is waiting for her referral for a sleep study

## 2024-04-02 ENCOUNTER — Telehealth: Payer: Self-pay | Admitting: *Deleted

## 2024-04-02 NOTE — Telephone Encounter (Signed)
 Copied from CRM [id number]. Topic: Clinical - Lab/Test Results >> Mar 31, 2024  2:19 PM Cathy Hall wrote: Reason for CRM: patient calling for her pap lab results

## 2024-04-02 NOTE — Telephone Encounter (Signed)
 Called patient x 2.  Patient did not pick up.  Will send MyChart message.

## 2024-04-03 ENCOUNTER — Other Ambulatory Visit (HOSPITAL_COMMUNITY): Payer: Self-pay

## 2024-04-06 ENCOUNTER — Telehealth: Payer: Self-pay | Admitting: *Deleted

## 2024-04-06 NOTE — Telephone Encounter (Signed)
 Will forward to Dr. Authur Leghorn. Copied from CRM [id number]. Topic: General - Other >> Apr 03, 2024  3:39 PM Adrianna P wrote: Reason for CRM: Patient called for update on pap smear results

## 2024-04-08 ENCOUNTER — Telehealth: Payer: Self-pay | Admitting: *Deleted

## 2024-04-08 NOTE — Telephone Encounter (Signed)
 Patient called again she is requesting a call regarding her pap smear results. Please address.

## 2024-04-08 NOTE — Progress Notes (Signed)
 Internal Medicine Clinic Attending  Case discussed with the resident at the time of the visit.  We reviewed the resident's history and exam and pertinent patient test results.  I agree with the assessment, diagnosis, and plan of care documented in the resident's note.

## 2024-04-08 NOTE — Telephone Encounter (Unsigned)
 Copied from CRM [id number]. Topic: Clinical - Lab/Test Results >> Apr 08, 2024  1:45 PM Adrianna P wrote: Reason for CRM: Calling for pap smear results

## 2024-04-09 ENCOUNTER — Ambulatory Visit: Payer: Self-pay

## 2024-04-09 DIAGNOSIS — Z8673 Personal history of transient ischemic attack (TIA), and cerebral infarction without residual deficits: Secondary | ICD-10-CM

## 2024-04-09 DIAGNOSIS — I10 Essential (primary) hypertension: Secondary | ICD-10-CM

## 2024-04-09 DIAGNOSIS — I739 Peripheral vascular disease, unspecified: Secondary | ICD-10-CM

## 2024-04-09 DIAGNOSIS — J42 Unspecified chronic bronchitis: Secondary | ICD-10-CM

## 2024-04-09 DIAGNOSIS — N941 Unspecified dyspareunia: Secondary | ICD-10-CM

## 2024-04-09 DIAGNOSIS — I5033 Acute on chronic diastolic (congestive) heart failure: Secondary | ICD-10-CM

## 2024-04-09 DIAGNOSIS — M545 Low back pain, unspecified: Secondary | ICD-10-CM

## 2024-04-09 DIAGNOSIS — F333 Major depressive disorder, recurrent, severe with psychotic symptoms: Secondary | ICD-10-CM

## 2024-04-09 DIAGNOSIS — E119 Type 2 diabetes mellitus without complications: Secondary | ICD-10-CM

## 2024-04-09 DIAGNOSIS — G8929 Other chronic pain: Secondary | ICD-10-CM

## 2024-04-09 DIAGNOSIS — E118 Type 2 diabetes mellitus with unspecified complications: Secondary | ICD-10-CM

## 2024-04-09 NOTE — Telephone Encounter (Signed)
 Copied from CRM [id number]. Topic: Clinical - Lab/Test Results >> Apr 09, 2024  2:13 PM Carrielelia G wrote: Reason for CRM: patient would like to speak with medical regarding her test results   Patient provided with lab results. Patient also requesting all of [facility] due to a problem with her current pharmacy that is causing her to not get her prescriptions. The patient has requested a call regarding this request.   [place] - [facility] [address], [place] [address] Phone: [phone]  Fax: [phone] [facility] [place] #: [id number]    Reason for Disposition  Caller requesting routine or non-urgent lab result  Answer Assessment - Initial Assessment Questions 1. REASON FOR CALL or QUESTION: "What is your reason for calling today?" or "How can I best help you?" or "What question do you have that I can help answer?"     PAP smear results  2. CALLER: Document the source of call. (e.g., laboratory, patient).     Patient  Protocols used: PCP Call - No Triage-A-AH

## 2024-04-09 NOTE — Telephone Encounter (Signed)
 Pt stated someone had already called her about pap results. Stated she's changing pharmacies and needs all of her meds sent to [facility].

## 2024-04-09 NOTE — Addendum Note (Signed)
 Addended by: Daine Drummer on: 04/09/2024 02:52 PM   Modules accepted: Orders

## 2024-04-15 ENCOUNTER — Encounter: Payer: Self-pay | Admitting: Pulmonary Disease

## 2024-04-16 ENCOUNTER — Other Ambulatory Visit (HOSPITAL_COMMUNITY): Payer: Self-pay

## 2024-04-16 MED ORDER — LIRAGLUTIDE 18 MG/3ML ~~LOC~~ SOPN
1.2000 mg | PEN_INJECTOR | Freq: Every day | SUBCUTANEOUS | 3 refills | Status: DC
Start: 2023-12-16 — End: 2024-05-18
  Filled 2024-04-27: qty 9, 45d supply, fill #0

## 2024-04-16 MED ORDER — MONTELUKAST SODIUM 10 MG PO TABS
10.0000 mg | ORAL_TABLET | Freq: Every evening | ORAL | 11 refills | Status: DC
Start: 2023-11-04 — End: 2024-05-18
  Filled 2024-04-27: qty 30, 30d supply, fill #0

## 2024-04-17 ENCOUNTER — Other Ambulatory Visit (HOSPITAL_COMMUNITY): Payer: Self-pay

## 2024-04-17 MED ORDER — MOUNJARO 2.5 MG/0.5ML ~~LOC~~ SOAJ
2.5000 mg | SUBCUTANEOUS | 11 refills | Status: DC
  Filled 2024-05-01: qty 2, 28d supply, fill #0

## 2024-04-17 MED ORDER — INSULIN DEGLUDEC 100 UNIT/ML ~~LOC~~ SOPN
20.0000 [IU] | PEN_INJECTOR | Freq: Every day | SUBCUTANEOUS | 3 refills | Status: AC
  Filled 2024-04-27 – 2024-05-01 (×3): qty 15, 75d supply, fill #0
  Filled 2024-06-29: qty 15, 75d supply, fill #1
  Filled 2024-09-09: qty 15, 75d supply, fill #2

## 2024-04-22 ENCOUNTER — Encounter: Payer: Self-pay | Admitting: *Deleted

## 2024-04-27 ENCOUNTER — Other Ambulatory Visit (HOSPITAL_BASED_OUTPATIENT_CLINIC_OR_DEPARTMENT_OTHER): Payer: Self-pay

## 2024-04-27 ENCOUNTER — Other Ambulatory Visit: Payer: Self-pay | Admitting: Internal Medicine

## 2024-04-27 ENCOUNTER — Ambulatory Visit: Payer: Self-pay

## 2024-04-27 ENCOUNTER — Other Ambulatory Visit (HOSPITAL_COMMUNITY): Payer: Self-pay

## 2024-04-27 DIAGNOSIS — F333 Major depressive disorder, recurrent, severe with psychotic symptoms: Secondary | ICD-10-CM

## 2024-04-27 DIAGNOSIS — M545 Low back pain, unspecified: Secondary | ICD-10-CM

## 2024-04-27 DIAGNOSIS — G4700 Insomnia, unspecified: Secondary | ICD-10-CM

## 2024-04-27 DIAGNOSIS — G8929 Other chronic pain: Secondary | ICD-10-CM

## 2024-04-27 DIAGNOSIS — I5033 Acute on chronic diastolic (congestive) heart failure: Secondary | ICD-10-CM

## 2024-04-27 MED ORDER — TRAZODONE HCL 50 MG PO TABS
50.0000 mg | ORAL_TABLET | Freq: Every day | ORAL | 0 refills | Status: DC
  Filled 2024-04-27: qty 30, 30d supply, fill #0

## 2024-04-27 MED ORDER — CYCLOBENZAPRINE HCL 10 MG PO TABS
10.0000 mg | ORAL_TABLET | Freq: Three times a day (TID) | ORAL | 0 refills | Status: DC | PRN
  Filled 2024-04-27: qty 30, 10d supply, fill #0

## 2024-04-27 MED FILL — Insulin Glargine Soln Pen-Injector 100 Unit/ML: SUBCUTANEOUS | 80 days supply | Qty: 12 | Fill #0 | Status: CN

## 2024-04-27 NOTE — Telephone Encounter (Signed)
 Copied from CRM [id number]. Topic: Clinical - Medication Refill >> Apr 27, 2024  1:15 PM Alfonso ORN wrote: Medication:  furosemide  (LASIX ) 40 MG tablet,DULoxetine  (CYMBALTA ) 30 MG capsule,diclofenac  Sodium (VOLTAREN ) 1 % GEL  Has the patient contacted their pharmacy? Yes (Agent: If no, request that the patient contact the pharmacy for the refill. If patient does not wish to contact the pharmacy document the reason why and proceed with request.) (Agent: If yes, when and what did the pharmacy advise?) [place] - [facility] [address], [place] [address] Phone: [phone]  Fax: [phone]   This is the patient's preferred pharmacy: No Pharmacies Listed Is this the correct pharmacy for this prescription? Yes If no, delete pharmacy and type the correct one.   Has the prescription been filled recently? Yes  Is the patient out of the medication? No  Has the patient been seen for an appointment in the last year OR does the patient have an upcoming appointment? Yes  Can we respond through MyChart? No  Agent: Please be advised that Rx refills may take up to 3 business days. We ask that you follow-up with your pharmacy.

## 2024-04-27 NOTE — Telephone Encounter (Signed)
 Please see pt request below.  Thanks!   Copied from CRM [id number]. Topic: Clinical - Prescription Issue >> Apr 27, 2024  1:00 PM Adrianna P wrote: Reason for CRM: Patient called she needs dr [facility]   ----------------------------------------------------------------------- From previous Reason for Contact - Other: Reason for CRM:

## 2024-04-29 ENCOUNTER — Other Ambulatory Visit (HOSPITAL_COMMUNITY): Payer: Self-pay

## 2024-04-30 ENCOUNTER — Telehealth: Payer: Self-pay

## 2024-04-30 NOTE — Telephone Encounter (Signed)
 Pharmacy Patient Advocate Encounter   Received notification from CoverMyMeds that prior authorization for TRESIBA  is required/requested.   Insurance verification completed.   The patient is insured through [employer] .   PA required; PA submitted to above mentioned insurance via CoverMyMeds Key/confirmation #/EOC [id number]. Status is pending

## 2024-05-01 ENCOUNTER — Other Ambulatory Visit (HOSPITAL_COMMUNITY): Payer: Self-pay

## 2024-05-01 NOTE — Telephone Encounter (Signed)
 Pharmacy Patient Advocate Encounter  Received notification from [facility] that Prior Authorization for TRESIBA  has been APPROVED from 05/01/24 to 05/01/25

## 2024-05-06 ENCOUNTER — Other Ambulatory Visit (HOSPITAL_COMMUNITY): Payer: Self-pay

## 2024-05-06 MED ORDER — DULOXETINE HCL 30 MG PO CPEP
30.0000 mg | ORAL_CAPSULE | Freq: Every day | ORAL | 3 refills | Status: DC
  Filled 2024-05-06: qty 90, 90d supply, fill #0

## 2024-05-06 MED ORDER — FUROSEMIDE 40 MG PO TABS
40.0000 mg | ORAL_TABLET | Freq: Every day | ORAL | 3 refills | Status: DC
  Filled 2024-05-06: qty 90, 90d supply, fill #0

## 2024-05-17 ENCOUNTER — Emergency Department (HOSPITAL_COMMUNITY): Payer: MEDICAID

## 2024-05-17 ENCOUNTER — Emergency Department (HOSPITAL_COMMUNITY)
Admission: EM | Admit: 2024-05-17 | Discharge: 2024-05-17 | Disposition: A | Discharge status: Home / Self Care | Payer: MEDICAID | Attending: Emergency Medicine | Admitting: Emergency Medicine

## 2024-05-17 ENCOUNTER — Other Ambulatory Visit: Payer: Self-pay

## 2024-05-17 DIAGNOSIS — Z7984 Long term (current) use of oral hypoglycemic drugs: Secondary | ICD-10-CM | POA: Diagnosis not present

## 2024-05-17 DIAGNOSIS — I509 Heart failure, unspecified: Secondary | ICD-10-CM | POA: Diagnosis not present

## 2024-05-17 DIAGNOSIS — E119 Type 2 diabetes mellitus without complications: Secondary | ICD-10-CM | POA: Diagnosis not present

## 2024-05-17 DIAGNOSIS — I110 Hypertensive heart disease with heart failure: Secondary | ICD-10-CM | POA: Insufficient documentation

## 2024-05-17 DIAGNOSIS — Z7982 Long term (current) use of aspirin: Secondary | ICD-10-CM | POA: Insufficient documentation

## 2024-05-17 DIAGNOSIS — K219 Gastro-esophageal reflux disease without esophagitis: Secondary | ICD-10-CM | POA: Diagnosis not present

## 2024-05-17 DIAGNOSIS — Z79899 Other long term (current) drug therapy: Secondary | ICD-10-CM | POA: Insufficient documentation

## 2024-05-17 DIAGNOSIS — Z794 Long term (current) use of insulin: Secondary | ICD-10-CM | POA: Insufficient documentation

## 2024-05-17 DIAGNOSIS — R079 Chest pain, unspecified: Secondary | ICD-10-CM | POA: Diagnosis present

## 2024-05-17 LAB — BASIC METABOLIC PANEL WITH GFR
Anion gap: 13 (ref 5–15)
BUN: 22 mg/dL — ABNORMAL HIGH (ref 6–20)
CO2: 27 mmol/L (ref 22–32)
Calcium: 11.8 mg/dL — ABNORMAL HIGH (ref 8.9–10.3)
Chloride: 97 mmol/L — ABNORMAL LOW (ref 98–111)
Creatinine, Ser: 1.21 mg/dL — ABNORMAL HIGH (ref 0.44–1.00)
GFR, Estimated: 54 mL/min — ABNORMAL LOW (ref >60)
Glucose, Bld: 190 mg/dL — ABNORMAL HIGH (ref 70–99)
Potassium: 4.7 mmol/L (ref 3.5–5.1)
Sodium: 137 mmol/L (ref 135–145)

## 2024-05-17 LAB — RAPID URINE DRUG SCREEN, HOSP PERFORMED
Amphetamines: NOT DETECTED
Barbiturates: NOT DETECTED
Benzodiazepines: NOT DETECTED
Cocaine: NOT DETECTED
Opiates: POSITIVE — AB
Tetrahydrocannabinol: POSITIVE — AB

## 2024-05-17 LAB — CBC
HCT: 44.4 % (ref 36.0–46.0)
Hemoglobin: 14.8 g/dL (ref 12.0–15.0)
MCH: 31.4 pg (ref 26.0–34.0)
MCHC: 33.3 g/dL (ref 30.0–36.0)
MCV: 94.3 fL (ref 80.0–100.0)
Platelets: 207 K/uL (ref 150–400)
RBC: 4.71 MIL/uL (ref 3.87–5.11)
RDW: 13.5 % (ref 11.5–15.5)
WBC: 10.5 K/uL (ref 4.0–10.5)
nRBC: 0 % (ref 0.0–0.2)

## 2024-05-17 LAB — TROPONIN I (HIGH SENSITIVITY)
Troponin I (High Sensitivity): 18 ng/L — ABNORMAL HIGH (ref <18)
Troponin I (High Sensitivity): 19 ng/L — ABNORMAL HIGH (ref <18)

## 2024-05-17 LAB — BRAIN NATRIURETIC PEPTIDE: B Natriuretic Peptide: 9.7 pg/mL (ref 0.0–100.0)

## 2024-05-17 MED ORDER — MORPHINE SULFATE (PF) 4 MG/ML IV SOLN
4.0000 mg | Freq: Once | INTRAVENOUS | Status: AC
  Administered 2024-05-17: 4 mg via INTRAVENOUS
  Filled 2024-05-17: qty 1

## 2024-05-17 MED ORDER — ONDANSETRON HCL 4 MG/2ML IJ SOLN
4.0000 mg | Freq: Once | INTRAMUSCULAR | Status: AC
  Administered 2024-05-17: 4 mg via INTRAVENOUS
  Filled 2024-05-17: qty 2

## 2024-05-17 MED ORDER — ALUM & MAG HYDROXIDE-SIMETH 200-200-20 MG/5ML PO SUSP
30.0000 mL | Freq: Once | ORAL | Status: AC
  Administered 2024-05-17: 30 mL via ORAL
  Filled 2024-05-17: qty 30

## 2024-05-17 MED ORDER — IOHEXOL 350 MG/ML SOLN
80.0000 mL | Freq: Once | INTRAVENOUS | Status: AC | PRN
Start: 2024-05-17 — End: 2024-05-17
  Administered 2024-05-17: 80 mL via INTRAVENOUS

## 2024-05-17 MED ORDER — PANTOPRAZOLE SODIUM 20 MG PO TBEC
20.0000 mg | DELAYED_RELEASE_TABLET | Freq: Every day | ORAL | 0 refills | Status: DC
Start: 2024-05-17 — End: 2024-05-21
  Filled 2024-05-17: qty 30, 30d supply, fill #0

## 2024-05-17 MED ORDER — ALBUTEROL SULFATE HFA 108 (90 BASE) MCG/ACT IN AERS
2.0000 | INHALATION_SPRAY | RESPIRATORY_TRACT | Status: DC | PRN
  Administered 2024-05-17: 2 via RESPIRATORY_TRACT
  Filled 2024-05-17: qty 6.7

## 2024-05-17 NOTE — ED Triage Notes (Signed)
 Pt BIB EMS from home c/o CP x3days. Pt has a hx of CHF

## 2024-05-17 NOTE — ED Provider Notes (Signed)
 [place] EMERGENCY DEPARTMENT AT [facility] Provider Note   CSN: [id number] Arrival date & time: 05/17/24  9450     Patient presents with: Chest Pain   Cathy Hall is a 53 y.o. female.   The history is provided by the patient, the EMS personnel and medical records. No language interpreter was used.  Chest Pain    53 year old female history of hypertension, polysubstance use, CHF, diabetes, obesity presenting with complaints of chest pain.  Patient report persistent central chest pain ongoing for the past 3 days.  Pain is also accompanied with dizziness and nausea.  Pain is progressively worse not improved despite taking over-the-counter medication like Tums.  No fever no shortness of breath no cough.  Admits to tobacco use but denies alcohol  use.  Prior to Admission medications   Medication Sig Start Date End Date Taking? Authorizing Provider  acetaminophen  (TYLENOL ) 325 MG tablet Take 650 mg by mouth 2 (two) times daily as needed for fever, headache or moderate pain.    [provider]  albuterol  (VENTOLIN  HFA) 108 (90 Base) MCG/ACT inhaler Inhale 2 puffs into the lungs every 4 (four) hours as needed for wheezing or shortness of breath. 11/11/23 11/10/24  Masters, Katie, DO  aspirin  EC 81 MG tablet Take 1 tablet (81 mg total) by mouth daily. Swallow whole. 11/11/23 11/10/24  Masters, Katie, DO  atorvastatin  (LIPITOR) 40 MG tablet Take 1 tablet (40 mg total) by mouth daily. 11/11/23 11/10/24  Masters, Izetta, DO  conjugated estrogens  (PREMARIN ) vaginal cream Apply one applicator INTRAVAGINALLY twice a week 03/26/24   Alexander-Savino, Washington, MD  cyclobenzaprine  (FLEXERIL ) 10 MG tablet Take 1 tablet (10 mg total) by mouth 3 (three) times daily as needed for muscle spasms. 04/27/24   Amoako, Prince, MD  diclofenac  Sodium (VOLTAREN ) 1 % GEL Apply 1 Application topically 4 (four) times daily as needed (pain). 11/11/23   Masters, Katie, DO  DULoxetine  (CYMBALTA ) 30 MG  capsule Take 1 capsule (30 mg total) by mouth daily. 05/06/24 05/06/25  Amoako, Prince, MD  empagliflozin  (JARDIANCE ) 10 MG TABS tablet Take 1 tablet (10 mg total) by mouth daily before breakfast. 02/27/24   Elnora Ip, MD  Ferrous Sulfate (IRON PO) Take 1 tablet by mouth daily.    [provider]  fluticasone  (FLONASE ) 50 MCG/ACT nasal spray PLACE 2 SPRAYS INTO BOTH NOSTRILS DAILY. Patient taking differently: Place 2 sprays into both nostrils daily as needed for allergies. 11/30/20 06/15/23  Sherrill Cable Latif, DO  furosemide  (LASIX ) 40 MG tablet Take 1 tablet (40 mg total) by mouth daily. 05/06/24 05/06/25  Amoako, Prince, MD  hydrOXYzine  (ATARAX ) 50 MG tablet Take 1 tablet (50 mg total) by mouth every 6 (six) hours as needed for anxiety. 11/11/23   Masters, Katie, DO  insulin  degludec (TRESIBA ) 100 UNIT/ML FlexTouch Pen Inject 20 Units into the skin daily. 01/02/24   Tobie Gaines, DO  insulin  glargine (LANTUS ) 100 UNIT/ML Solostar Pen Inject 15 Units into the skin daily. 11/29/23   Masters, Katie, DO  lidocaine  (LIDODERM ) 5 % Place 1 patch onto the skin every 12 (twelve) hours. Remove & Discard patch within 12 hours or as directed by MD 11/11/23 11/10/24  Masters, Izetta, DO  liraglutide  (VICTOZA ) 18 MG/3ML SOPN Inject 1.2 mg into the skin daily. 12/16/23   Tobie Gaines, DO  loratadine  (CLARITIN  REDITABS) 10 MG dissolvable tablet Place 1 tablet (10 mg total) on the tongue and allow to dissolve daily. 11/11/23   Masters, Katie, DO  melatonin  3 MG TABS tablet Take 2 tablets (6 mg total) by mouth at bedtime. 11/29/21   McQuilla, Jai B, MD  mometasone -formoterol  (DULERA ) 100-5 MCG/ACT AERO Inhale 2 puffs into the lungs 2 (two) times daily. 11/11/23 11/10/24  Masters, Katie, DO  montelukast  (SINGULAIR ) 10 MG tablet Take 1 tablet (10 mg total) by mouth at bedtime. 11/11/23 11/10/24  Masters, Katie, DO  montelukast  (SINGULAIR ) 10 MG tablet Take 1 tablet (10 mg total) by mouth at bedtime. 11/04/23   Kassie Acquanetta Bradley,  MD  Multiple Vitamin (MULTIVITAMIN WITH MINERALS) TABS tablet Take 1 tablet by mouth daily. 11/30/20   Sheikh, Alejandro Latif, DO  nicotine  (NICODERM CQ  - DOSED IN MG/24 HOURS) 14 mg/24hr patch Place 1 patch (14 mg total) onto the skin daily. 11/12/23   Masters, Katie, DO  olmesartan  (BENICAR ) 20 MG tablet Take 1 tablet (20 mg total) by mouth daily. 11/11/23   Masters, Katie, DO  Polyvinyl Alcohol -Povidone (REFRESH OP) Place 1 drop into both eyes 2 (two) times daily as needed (dryness).    [provider]  pregabalin  (LYRICA ) 50 MG capsule Take 1 capsule (50 mg total) by mouth 3 (three) times daily. 11/11/23   Masters, Katie, DO  tiotropium (SPIRIVA ) 18 MCG inhalation capsule Place 1 capsule (18 mcg total) into handihaler and inhale daily. *do not swallow capsule* 12/10/23   Kassie Acquanetta Bradley, MD  tirzepatide  (MOUNJARO ) 2.5 MG/0.5ML Pen Inject 2.5 mg into the skin once a week. 03/09/24 02/08/25  Masters, Katie, DO  tirzepatide  (MOUNJARO ) 2.5 MG/0.5ML Pen Inject 2.5 mg into the skin once a week. 02/27/24   Gomez-Caraballo, Maria, MD  traMADol  (ULTRAM ) 50 MG tablet TAKE 2 TABLETS (100 MG TOTAL) BY MOUTH EVERY 12 (TWELVE) HOURS AS NEEDED. 12/11/23   Masters, Izetta, DO  traZODone  (DESYREL ) 50 MG tablet Take 1 tablet (50 mg total) by mouth at bedtime. 04/27/24   Renne Homans, MD    Allergies: Nsaids    Review of Systems  Cardiovascular:  Positive for chest pain.  All other systems reviewed and are negative.   Updated Vital Signs BP 118/74 (BP Location: Right Arm)   Pulse 100   Temp 98 F (36.7 C) (Oral)   Resp (!) 22   Ht 5' 1 (1.549 m)   Wt 108.9 kg   SpO2 100%   BMI 45.35 kg/m   Physical Exam Vitals and nursing note reviewed.  Constitutional:      General: She is in acute distress.     Appearance: She is well-developed.  HENT:     Head: Atraumatic.  Eyes:     Conjunctiva/sclera: Conjunctivae normal.  Cardiovascular:     Rate and Rhythm: Normal rate and regular rhythm.     Pulses:  Normal pulses.     Heart sounds: Normal heart sounds.  Pulmonary:     Effort: Pulmonary effort is normal.  Abdominal:     Palpations: Abdomen is soft.     Tenderness: There is no abdominal tenderness.  Musculoskeletal:     Cervical back: Neck supple.     Right lower leg: No edema.     Left lower leg: No edema.  Skin:    Findings: No rash.  Neurological:     Mental Status: She is alert.  Psychiatric:        Mood and Affect: Mood normal.     (all labs ordered are listed, but only abnormal results are displayed) Labs Reviewed  BASIC METABOLIC PANEL WITH GFR - Abnormal; Notable for the following  components:      Result Value   Chloride 97 (*)    Glucose, Bld 190 (*)    BUN 22 (*)    Creatinine, Ser 1.21 (*)    Calcium  11.8 (*)    GFR, Estimated 54 (*)    All other components within normal limits  RAPID URINE DRUG SCREEN, HOSP PERFORMED - Abnormal; Notable for the following components:   Opiates POSITIVE (*)    Tetrahydrocannabinol POSITIVE (*)    All other components within normal limits  TROPONIN I (HIGH SENSITIVITY) - Abnormal; Notable for the following components:   Troponin I (High Sensitivity) 18 (*)    All other components within normal limits  TROPONIN I (HIGH SENSITIVITY) - Abnormal; Notable for the following components:   Troponin I (High Sensitivity) 19 (*)    All other components within normal limits  CBC  BRAIN NATRIURETIC PEPTIDE    EKG: EKG Interpretation Date/Time:  Sunday May 17 2024 06:34:37 EDT Ventricular Rate:  101 PR Interval:  148 QRS Duration:  76 QT Interval:  328 QTC Calculation: 426 R Axis:   59  Text Interpretation: Sinus tachycardia Minimal ST depression, inferior leads No significant change since last tracing Confirmed by Doretha Folks ([id number]) on 05/17/2024 7:05:20 AM  Radiology: CT Angio Chest PE W and/or Wo Contrast Result Date: 05/17/2024 CLINICAL DATA:  Pulmonary embolism (PE) suspected, low to intermediate prob, positive  D-dimer EXAM: CT ANGIOGRAPHY CHEST WITH CONTRAST TECHNIQUE: Multidetector CT imaging of the chest was performed using the standard protocol during bolus administration of intravenous contrast. Multiplanar CT image reconstructions and MIPs were obtained to evaluate the vascular anatomy. RADIATION DOSE REDUCTION: This exam was performed according to the departmental dose-optimization program which includes automated exposure control, adjustment of the mA and/or kV according to patient size and/or use of iterative reconstruction technique. CONTRAST:  80mL OMNIPAQUE  IOHEXOL  350 MG/ML SOLN COMPARISON:  April 11, 2023 FINDINGS: Cardiovascular: Evaluation is limited secondary to motion and body habitus. Satisfactory opacification of the pulmonary arteries to the segmental level. No evidence of pulmonary embolism. Heart is the upper limits of normal in size. No pericardial effusion. Mild atherosclerotic calcifications. Mediastinum/Nodes: Prominent appearance of the thyroid  without discrete lesion. No axillary or mediastinal adenopathy. Lungs/Pleura: Minimal underlying emphysema. No pleural effusion or pneumothorax. No focal consolidation. Upper Abdomen: Small hiatal hernia. Marked distension of the stomach with fluid. Favored underlying hepatic steatosis. Musculoskeletal: No chest wall abnormality. No acute or significant osseous findings. Review of the MIP images confirms the above findings. IMPRESSION: 1. No evidence of pulmonary embolism. 2. Aorta underlying hepatic steatosis. 3. Distension of the stomach with fluid. Aortic Atherosclerosis (ICD10-I70.0) and Emphysema (ICD10-J43.9). Electronically Signed   By: Corean Salter M.D.   On: 05/17/2024 14:15   DG Chest Port 1 View Result Date: 05/17/2024 CLINICAL DATA:  Chest pain. EXAM: PORTABLE CHEST 1 VIEW COMPARISON:  AP Lat chest 04/10/2023 FINDINGS: The lungs are expiratory. The heart appears enlarged but is probably exaggerated by low inspiration. No vascular  congestion is seen. Visualized lungs are clear but with limited view of the bases. There is mild calcification in the transverse aorta. The mediastinum is normally outlined. There is thoracic spondylosis.  Overlying monitor wires. IMPRESSION: 1. Expiratory chest with limited view of the bases. No evidence of acute chest disease as far as visualized. 2. Aortic atherosclerosis. 3. Possible cardiomegaly versus exaggerated heart silhouette. Electronically Signed   By: Francis Quam M.D.   On: 05/17/2024 06:40     Procedures  Medications Ordered in the ED  alum & mag hydroxide-simeth (MAALOX/MYLANTA) 200-200-20 MG/5ML suspension 30 mL (30 mLs Oral Given 05/17/24 0644)  morphine  (PF) 4 MG/ML injection 4 mg (4 mg Intravenous Given 05/17/24 0648)  ondansetron  (ZOFRAN ) injection 4 mg (4 mg Intravenous Given 05/17/24 0648)  morphine  (PF) 4 MG/ML injection 4 mg (4 mg Intravenous Given 05/17/24 1059)                                    Medical Decision Making Amount and/or Complexity of Data Reviewed Labs: ordered. Radiology: ordered.  Risk OTC drugs. Prescription drug management.   BP 118/74 (BP Location: Right Arm)   Pulse 100   Temp 98 F (36.7 C) (Oral)   Resp (!) 22   Ht 5' 1 (1.549 m)   Wt 108.9 kg   SpO2 100%   BMI 45.35 kg/m   62:62 AM  53 year old female history of hypertension, polysubstance use, CHF, diabetes, obesity presenting with complaints of chest pain.  Patient report persistent central chest pain ongoing for the past 3 days.  Pain is also accompanied with dizziness and nausea.  Pain is progressively worse not improved despite taking over-the-counter medication like Tums.  No fever no shortness of breath no cough.  Admits to tobacco use but denies alcohol  use.  On exam patient appears very uncomfortable.  Lungs otherwise clear heart with normal rhythm abdomen soft nontender, no peripheral edema noted.  HEART score of 5, moderate risk of MACE  -Labs ordered,  independently viewed and interpreted by me.  Labs remarkable for Trop of 18, and 19 respectively.  Cr 1.21 -The patient was maintained on a cardiac monitor.  I personally viewed and interpreted the cardiac monitored which showed an underlying rhythm of: sinus tachycardia -Imaging independently viewed and interpreted by me and I agree with radiologist's interpretation.  Result remarkable for cxr without acute changes -This patient presents to the ED for concern of CP, this involves an extensive number of treatment options, and is a complaint that carries with it a high risk of complications and morbidity.  The differential diagnosis includes acs, pe, pleurisy, pna, dissection, gerd, gastritis -Co morbidities that complicate the patient evaluation includes htn, chf, dm -Treatment includes morphine  -Reevaluation of the patient after these medicines showed that the patient improved -PCP office notes or outside notes reviewed -Discussion with attending Dr. Doretha -Escalation to admission/observation considered: patients feels much better, is comfortable with discharge, and will follow up with cardiology -Prescription medication considered, patient comfortable with prilosec, and albuterol  -Social Determinant of Health considered which includes food insecurity, tobacco use, lack of transportation  On reassessment patient does endorse having shortness of breath.  She is still having some pain.  Will obtain chest CT angiogram to rule out PE.  Fortunately chest CT angiogram shows no evidence of pulm embolism.  However, patient has a documented O2 sats of 88% on room air.  Will have patient ambulate and check O2.  Patient ambulate while maintaining O2 sats of 98 to 100%.  She is stable for discharge.     Final diagnoses:  Nonspecific chest pain  Gastroesophageal reflux disease, unspecified whether esophagitis present    ED Discharge Orders          Ordered    pantoprazole  (PROTONIX ) 20 MG tablet   Daily        05/17/24 1429    Ambulatory referral to Cardiology  Comments: If you have not heard from the Cardiology office within the next 72 hours please call [phone].   05/17/24 1429               Nivia Colon, PA-C 05/17/24 1432    Doretha Folks, MD 05/18/24 1550

## 2024-05-17 NOTE — ED Notes (Addendum)
 Pt ambulated in [facility].  Walking- 98-100% RA HR- 122

## 2024-05-17 NOTE — Discharge Instructions (Signed)
 You may take Prilosec as prescribed as needed for your heartburn.  Use albuterol  inhaler 2 puffs every 4 hours as needed for shortness of breath.  Follow-up with cardiologist for outpatient evaluation of your condition.  Return if you have any concern.

## 2024-05-18 ENCOUNTER — Observation Stay (HOSPITAL_BASED_OUTPATIENT_CLINIC_OR_DEPARTMENT_OTHER): Payer: MEDICAID

## 2024-05-18 ENCOUNTER — Other Ambulatory Visit (HOSPITAL_COMMUNITY): Payer: Self-pay

## 2024-05-18 ENCOUNTER — Encounter (HOSPITAL_COMMUNITY): Payer: Self-pay | Admitting: Emergency Medicine

## 2024-05-18 ENCOUNTER — Observation Stay (HOSPITAL_COMMUNITY)
Admission: EM | Admit: 2024-05-18 | Discharge: 2024-05-21 | Disposition: A | Discharge status: Home / Self Care | Payer: MEDICAID | Attending: Emergency Medicine | Admitting: Emergency Medicine

## 2024-05-18 DIAGNOSIS — J449 Chronic obstructive pulmonary disease, unspecified: Secondary | ICD-10-CM | POA: Insufficient documentation

## 2024-05-18 DIAGNOSIS — Z794 Long term (current) use of insulin: Secondary | ICD-10-CM | POA: Insufficient documentation

## 2024-05-18 DIAGNOSIS — E118 Type 2 diabetes mellitus with unspecified complications: Secondary | ICD-10-CM

## 2024-05-18 DIAGNOSIS — Z79899 Other long term (current) drug therapy: Secondary | ICD-10-CM | POA: Diagnosis not present

## 2024-05-18 DIAGNOSIS — R079 Chest pain, unspecified: Principal | ICD-10-CM | POA: Diagnosis present

## 2024-05-18 DIAGNOSIS — J45909 Unspecified asthma, uncomplicated: Secondary | ICD-10-CM | POA: Diagnosis not present

## 2024-05-18 DIAGNOSIS — K3184 Gastroparesis: Secondary | ICD-10-CM | POA: Diagnosis not present

## 2024-05-18 DIAGNOSIS — E662 Morbid (severe) obesity with alveolar hypoventilation: Secondary | ICD-10-CM | POA: Diagnosis present

## 2024-05-18 DIAGNOSIS — R739 Hyperglycemia, unspecified: Secondary | ICD-10-CM

## 2024-05-18 DIAGNOSIS — Z7982 Long term (current) use of aspirin: Secondary | ICD-10-CM | POA: Diagnosis not present

## 2024-05-18 DIAGNOSIS — E66813 Obesity, class 3: Secondary | ICD-10-CM | POA: Insufficient documentation

## 2024-05-18 DIAGNOSIS — I200 Unstable angina: Secondary | ICD-10-CM | POA: Insufficient documentation

## 2024-05-18 DIAGNOSIS — I5030 Unspecified diastolic (congestive) heart failure: Secondary | ICD-10-CM | POA: Diagnosis not present

## 2024-05-18 DIAGNOSIS — Z1152 Encounter for screening for COVID-19: Secondary | ICD-10-CM | POA: Diagnosis not present

## 2024-05-18 DIAGNOSIS — F333 Major depressive disorder, recurrent, severe with psychotic symptoms: Secondary | ICD-10-CM | POA: Diagnosis present

## 2024-05-18 DIAGNOSIS — R0789 Other chest pain: Principal | ICD-10-CM | POA: Insufficient documentation

## 2024-05-18 DIAGNOSIS — Z8679 Personal history of other diseases of the circulatory system: Secondary | ICD-10-CM | POA: Insufficient documentation

## 2024-05-18 DIAGNOSIS — K219 Gastro-esophageal reflux disease without esophagitis: Secondary | ICD-10-CM | POA: Diagnosis not present

## 2024-05-18 DIAGNOSIS — F17210 Nicotine dependence, cigarettes, uncomplicated: Secondary | ICD-10-CM | POA: Insufficient documentation

## 2024-05-18 DIAGNOSIS — Z6841 Body Mass Index (BMI) 40.0 and over, adult: Secondary | ICD-10-CM | POA: Diagnosis not present

## 2024-05-18 DIAGNOSIS — F32A Depression, unspecified: Secondary | ICD-10-CM | POA: Insufficient documentation

## 2024-05-18 DIAGNOSIS — I110 Hypertensive heart disease with heart failure: Secondary | ICD-10-CM | POA: Insufficient documentation

## 2024-05-18 DIAGNOSIS — M940 Chondrocostal junction syndrome [Tietze]: Secondary | ICD-10-CM | POA: Diagnosis not present

## 2024-05-18 DIAGNOSIS — J9611 Chronic respiratory failure with hypoxia: Secondary | ICD-10-CM | POA: Diagnosis not present

## 2024-05-18 DIAGNOSIS — R7989 Other specified abnormal findings of blood chemistry: Secondary | ICD-10-CM

## 2024-05-18 DIAGNOSIS — Z8673 Personal history of transient ischemic attack (TIA), and cerebral infarction without residual deficits: Secondary | ICD-10-CM

## 2024-05-18 DIAGNOSIS — I10 Essential (primary) hypertension: Secondary | ICD-10-CM | POA: Diagnosis present

## 2024-05-18 LAB — ECHOCARDIOGRAM COMPLETE
AR max vel: 2.09 cm2
AV Peak grad: 10.6 mmHg
Ao pk vel: 1.63 m/s
Area-P 1/2: 4.68 cm2
Est EF: >75
S' Lateral: 2.3 cm

## 2024-05-18 LAB — BASIC METABOLIC PANEL WITH GFR
Anion gap: 12 (ref 5–15)
BUN: 16 mg/dL (ref 6–20)
CO2: 26 mmol/L (ref 22–32)
Calcium: 9.9 mg/dL (ref 8.9–10.3)
Chloride: 98 mmol/L (ref 98–111)
Creatinine, Ser: 1.03 mg/dL — ABNORMAL HIGH (ref 0.44–1.00)
GFR, Estimated: >60 mL/min (ref >60)
Glucose, Bld: 216 mg/dL — ABNORMAL HIGH (ref 70–99)
Potassium: 4.3 mmol/L (ref 3.5–5.1)
Sodium: 136 mmol/L (ref 135–145)

## 2024-05-18 LAB — COMPREHENSIVE METABOLIC PANEL WITH GFR
ALT: 17 U/L (ref 0–44)
AST: 26 U/L (ref 15–41)
Albumin: 3.3 g/dL — ABNORMAL LOW (ref 3.5–5.0)
Alkaline Phosphatase: 77 U/L (ref 38–126)
Anion gap: 11 (ref 5–15)
BUN: 15 mg/dL (ref 6–20)
CO2: 25 mmol/L (ref 22–32)
Calcium: 9.9 mg/dL (ref 8.9–10.3)
Chloride: 100 mmol/L (ref 98–111)
Creatinine, Ser: 1.03 mg/dL — ABNORMAL HIGH (ref 0.44–1.00)
GFR, Estimated: >60 mL/min (ref >60)
Glucose, Bld: 235 mg/dL — ABNORMAL HIGH (ref 70–99)
Potassium: 4.5 mmol/L (ref 3.5–5.1)
Sodium: 136 mmol/L (ref 135–145)
Total Bilirubin: 0.6 mg/dL (ref 0.0–1.2)
Total Protein: 6.6 g/dL (ref 6.5–8.1)

## 2024-05-18 LAB — TROPONIN I (HIGH SENSITIVITY)
Troponin I (High Sensitivity): 27 ng/L — ABNORMAL HIGH (ref <18)
Troponin I (High Sensitivity): 30 ng/L — ABNORMAL HIGH (ref <18)
Troponin I (High Sensitivity): 36 ng/L — ABNORMAL HIGH (ref <18)
Troponin I (High Sensitivity): 42 ng/L — ABNORMAL HIGH (ref <18)
Troponin I (High Sensitivity): 40 ng/L — ABNORMAL HIGH (ref <18)

## 2024-05-18 LAB — CBC WITH DIFFERENTIAL/PLATELET
Abs Immature Granulocytes: 0.03 K/uL (ref 0.00–0.07)
Basophils Absolute: 0.1 K/uL (ref 0.0–0.1)
Basophils Relative: 1 %
Eosinophils Absolute: 0.3 K/uL (ref 0.0–0.5)
Eosinophils Relative: 3 %
HCT: 43 % (ref 36.0–46.0)
Hemoglobin: 13.6 g/dL (ref 12.0–15.0)
Immature Granulocytes: 0 %
Lymphocytes Relative: 29 %
Lymphs Abs: 3 K/uL (ref 0.7–4.0)
MCH: 30.8 pg (ref 26.0–34.0)
MCHC: 31.6 g/dL (ref 30.0–36.0)
MCV: 97.3 fL (ref 80.0–100.0)
Monocytes Absolute: 0.8 K/uL (ref 0.1–1.0)
Monocytes Relative: 8 %
Neutro Abs: 6 K/uL (ref 1.7–7.7)
Neutrophils Relative %: 59 %
Platelets: 203 K/uL (ref 150–400)
RBC: 4.42 MIL/uL (ref 3.87–5.11)
RDW: 13.6 % (ref 11.5–15.5)
WBC: 10.1 K/uL (ref 4.0–10.5)
nRBC: 0 % (ref 0.0–0.2)

## 2024-05-18 LAB — LIPID PANEL
Cholesterol: 257 mg/dL — ABNORMAL HIGH (ref 0–200)
HDL: 44 mg/dL (ref >40)
LDL Cholesterol: UNDETERMINED mg/dL (ref 0–99)
Total CHOL/HDL Ratio: 5.8 ratio
Triglycerides: 439 mg/dL — ABNORMAL HIGH (ref <150)
VLDL: UNDETERMINED mg/dL (ref 0–40)

## 2024-05-18 LAB — HIV ANTIBODY (ROUTINE TESTING W REFLEX): HIV Screen 4th Generation wRfx: NONREACTIVE

## 2024-05-18 LAB — BRAIN NATRIURETIC PEPTIDE: B Natriuretic Peptide: 13.3 pg/mL (ref 0.0–100.0)

## 2024-05-18 LAB — RESP PANEL BY RT-PCR (RSV, FLU A&B, COVID)  RVPGX2
Influenza A by PCR: NEGATIVE
Influenza B by PCR: NEGATIVE
Resp Syncytial Virus by PCR: NEGATIVE
SARS Coronavirus 2 by RT PCR: NEGATIVE

## 2024-05-18 LAB — CBG MONITORING, ED
Glucose-Capillary: 211 mg/dL — ABNORMAL HIGH (ref 70–99)
Glucose-Capillary: 187 mg/dL — ABNORMAL HIGH (ref 70–99)
Glucose-Capillary: 202 mg/dL — ABNORMAL HIGH (ref 70–99)
Glucose-Capillary: 185 mg/dL — ABNORMAL HIGH (ref 70–99)

## 2024-05-18 LAB — LDL CHOLESTEROL, DIRECT: Direct LDL: 156 mg/dL — ABNORMAL HIGH (ref 0–99)

## 2024-05-18 LAB — HEMOGLOBIN A1C
Hgb A1c MFr Bld: 9.1 % — ABNORMAL HIGH (ref 4.8–5.6)
Mean Plasma Glucose: 214 mg/dL

## 2024-05-18 LAB — C-REACTIVE PROTEIN: CRP: 1.7 mg/dL — ABNORMAL HIGH (ref <1.0)

## 2024-05-18 LAB — SEDIMENTATION RATE: Sed Rate: 24 mm/h — ABNORMAL HIGH (ref 0–22)

## 2024-05-18 MED ORDER — INSULIN ASPART 100 UNIT/ML IJ SOLN: 0.0000 [IU] | Freq: Every day | INTRAMUSCULAR | Status: DC

## 2024-05-18 MED ORDER — LIDOCAINE 5 % EX PTCH
1.0000 | MEDICATED_PATCH | Freq: Every day | CUTANEOUS | Status: DC
  Administered 2024-05-18 – 2024-05-21 (×5): 1 via TRANSDERMAL
  Filled 2024-05-18 (×5): qty 1

## 2024-05-18 MED ORDER — IBUPROFEN 400 MG PO TABS
600.0000 mg | ORAL_TABLET | Freq: Three times a day (TID) | ORAL | Status: DC
  Administered 2024-05-18 (×2): 600 mg via ORAL
  Filled 2024-05-18 (×3): qty 1

## 2024-05-18 MED ORDER — ASPIRIN 81 MG PO CHEW
324.0000 mg | CHEWABLE_TABLET | Freq: Once | ORAL | Status: DC
  Filled 2024-05-18: qty 4

## 2024-05-18 MED ORDER — EMPAGLIFLOZIN 10 MG PO TABS
10.0000 mg | ORAL_TABLET | Freq: Every day | ORAL | Status: DC
  Administered 2024-05-18 – 2024-05-21 (×4): 10 mg via ORAL
  Filled 2024-05-18 (×4): qty 1

## 2024-05-18 MED ORDER — DULOXETINE HCL 30 MG PO CPEP
30.0000 mg | ORAL_CAPSULE | Freq: Every day | ORAL | Status: DC
  Administered 2024-05-18 – 2024-05-21 (×4): 30 mg via ORAL
  Filled 2024-05-18 (×4): qty 1

## 2024-05-18 MED ORDER — PANTOPRAZOLE SODIUM 20 MG PO TBEC
20.0000 mg | DELAYED_RELEASE_TABLET | Freq: Every day | ORAL | Status: DC
  Administered 2024-05-18 – 2024-05-19 (×2): 20 mg via ORAL
  Filled 2024-05-18 (×3): qty 1

## 2024-05-18 MED ORDER — ACETAMINOPHEN 325 MG PO TABS
650.0000 mg | ORAL_TABLET | Freq: Four times a day (QID) | ORAL | Status: DC | PRN
  Administered 2024-05-20: 650 mg via ORAL
  Filled 2024-05-18: qty 2

## 2024-05-18 MED ORDER — HYDROXYZINE HCL 25 MG PO TABS
50.0000 mg | ORAL_TABLET | Freq: Four times a day (QID) | ORAL | Status: DC | PRN
  Administered 2024-05-19 – 2024-05-20 (×3): 50 mg via ORAL
  Filled 2024-05-18 (×3): qty 2

## 2024-05-18 MED ORDER — ALUM & MAG HYDROXIDE-SIMETH 200-200-20 MG/5ML PO SUSP
30.0000 mL | Freq: Four times a day (QID) | ORAL | Status: DC | PRN
  Administered 2024-05-19 – 2024-05-21 (×4): 30 mL via ORAL
  Filled 2024-05-18 (×4): qty 30

## 2024-05-18 MED ORDER — INSULIN GLARGINE-YFGN 100 UNIT/ML ~~LOC~~ SOLN
15.0000 [IU] | Freq: Every day | SUBCUTANEOUS | Status: DC
  Administered 2024-05-18 – 2024-05-21 (×4): 15 [IU] via SUBCUTANEOUS
  Filled 2024-05-18 (×4): qty 0.15

## 2024-05-18 MED ORDER — POLYETHYLENE GLYCOL 3350 17 G PO PACK: 17.0000 g | PACK | Freq: Every day | ORAL | Status: DC | PRN

## 2024-05-18 MED ORDER — COLCHICINE 0.6 MG PO TABS
0.6000 mg | ORAL_TABLET | Freq: Two times a day (BID) | ORAL | Status: DC
Start: 2024-05-18 — End: 2024-05-19
  Administered 2024-05-18: 0.6 mg via ORAL
  Filled 2024-05-18 (×2): qty 1

## 2024-05-18 MED ORDER — IRBESARTAN 150 MG PO TABS
150.0000 mg | ORAL_TABLET | Freq: Every day | ORAL | Status: DC
  Administered 2024-05-18 – 2024-05-21 (×4): 150 mg via ORAL
  Filled 2024-05-18 (×4): qty 1

## 2024-05-18 MED ORDER — INSULIN ASPART 100 UNIT/ML IJ SOLN
0.0000 [IU] | Freq: Three times a day (TID) | INTRAMUSCULAR | Status: DC
  Administered 2024-05-18: 2 [IU] via SUBCUTANEOUS
  Administered 2024-05-18 (×2): 3 [IU] via SUBCUTANEOUS
  Administered 2024-05-19 (×3): 2 [IU] via SUBCUTANEOUS
  Administered 2024-05-20: 1 [IU] via SUBCUTANEOUS
  Administered 2024-05-20 – 2024-05-21 (×3): 2 [IU] via SUBCUTANEOUS
  Administered 2024-05-21: 3 [IU] via SUBCUTANEOUS

## 2024-05-18 MED ORDER — NITROGLYCERIN 0.4 MG SL SUBL
0.4000 mg | SUBLINGUAL_TABLET | SUBLINGUAL | Status: AC | PRN
  Administered 2024-05-18 – 2024-05-19 (×3): 0.4 mg via SUBLINGUAL
  Filled 2024-05-18 (×3): qty 1

## 2024-05-18 MED ORDER — ASPIRIN 81 MG PO TBEC
81.0000 mg | DELAYED_RELEASE_TABLET | Freq: Every day | ORAL | Status: DC
  Administered 2024-05-18 – 2024-05-21 (×4): 81 mg via ORAL
  Filled 2024-05-18 (×4): qty 1

## 2024-05-18 MED ORDER — MONTELUKAST SODIUM 10 MG PO TABS
10.0000 mg | ORAL_TABLET | Freq: Every day | ORAL | Status: DC
  Administered 2024-05-18 – 2024-05-20 (×3): 10 mg via ORAL
  Filled 2024-05-18 (×3): qty 1

## 2024-05-18 MED ORDER — ALUM & MAG HYDROXIDE-SIMETH 200-200-20 MG/5ML PO SUSP
30.0000 mL | Freq: Once | ORAL | Status: AC
  Administered 2024-05-18: 30 mL via ORAL
  Filled 2024-05-18: qty 30

## 2024-05-18 MED ORDER — NITROGLYCERIN 2 % TD OINT
1.0000 [in_us] | TOPICAL_OINTMENT | Freq: Once | TRANSDERMAL | Status: AC
  Administered 2024-05-18: 1 [in_us] via TOPICAL
  Filled 2024-05-18: qty 1

## 2024-05-18 MED ORDER — FUROSEMIDE 40 MG PO TABS
40.0000 mg | ORAL_TABLET | Freq: Every day | ORAL | Status: DC
  Administered 2024-05-18 – 2024-05-21 (×4): 40 mg via ORAL
  Filled 2024-05-18 (×2): qty 1
  Filled 2024-05-18 (×2): qty 2

## 2024-05-18 MED ORDER — ACETAMINOPHEN 650 MG RE SUPP: 650.0000 mg | Freq: Four times a day (QID) | RECTAL | Status: DC | PRN

## 2024-05-18 MED ORDER — ENOXAPARIN SODIUM 40 MG/0.4ML IJ SOSY
40.0000 mg | PREFILLED_SYRINGE | Freq: Every day | INTRAMUSCULAR | Status: DC
  Administered 2024-05-18 – 2024-05-21 (×4): 40 mg via SUBCUTANEOUS
  Filled 2024-05-18 (×4): qty 0.4

## 2024-05-18 MED ORDER — ATORVASTATIN CALCIUM 40 MG PO TABS
40.0000 mg | ORAL_TABLET | Freq: Every day | ORAL | Status: DC
  Administered 2024-05-18 – 2024-05-21 (×4): 40 mg via ORAL
  Filled 2024-05-18 (×4): qty 1

## 2024-05-18 MED ORDER — ALBUTEROL SULFATE (2.5 MG/3ML) 0.083% IN NEBU: 2.5000 mg | INHALATION_SOLUTION | RESPIRATORY_TRACT | Status: DC | PRN

## 2024-05-18 MED ORDER — FLUTICASONE FUROATE-VILANTEROL 100-25 MCG/ACT IN AEPB
1.0000 | INHALATION_SPRAY | Freq: Every day | RESPIRATORY_TRACT | Status: DC
  Administered 2024-05-19 – 2024-05-21 (×3): 1 via RESPIRATORY_TRACT
  Filled 2024-05-18 (×2): qty 28

## 2024-05-18 MED ORDER — DICLOFENAC SODIUM 1 % EX GEL: 2.0000 g | Freq: Four times a day (QID) | CUTANEOUS | Status: DC | PRN

## 2024-05-18 NOTE — ED Notes (Signed)
 Called [facility].

## 2024-05-18 NOTE — Hospital Course (Addendum)
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 She presented with chest pain and mildly elevated troponins for 3 days prior to admission. Her pain was worse with lying down and resolved when she sat up. She has had regurgitation with belching that had been worsening.  Her symptoms worsened after switching from Victoza  to Mounjaro  1 month ago. She underwent NSTEMI rule out. Troponins peaked at 42. EKG showed sinus tachycardia, CTA was negative for PE. Cardiology were consulted and were not concerned for ACS.  CTA showed distended stomach with fluid. Physical exam showed distended abdomen and LUQ tympany. Her history, imaging, and physical exam are consistent with gastroparesis, likely stemming from her GLP-1 use, but could also be a progression of her diabetes. Her chest pain was thought to be stemming from her reflux symptoms in the setting of her gastroparesis, and not from a cardiac cause. She has continued pain during the night while lying flat and has received hyoscyamine  tablets with no significant benefit. During her admission, she received Carafate , Maalox, and Tums. She was started on Protonix  40mg  BID, Amlodipine , Reglan  5mg , Miralax  and Senna for discharge. GI was consulted and they agreed to continue Reglan , Protonix , and Miralax .  #Costochondritis Has had recurrent chest pain prompting a separate visit to the ED two days prior to her admission. CTA was negative for a PE. Chest x-ray showed potential cardiomegaly. EKG with sinus tachycardia. Heart score 5 on admission. Troponins elevated and rose to peak at 42 at 1425 on 7/14 BNP WNL. Was not started on heparin in the ED given only mild troponin elevation. Cardiology consulted and were not concerned for ACS and started empiric treatment for pericarditis, now discontinued due to only slightly elevated ESR and CRP. She has some anterior chest wall tenderness, possibly due to costochondritis, which was treated with a  Lidocaine  patch and Voltaren  gel    Stable Chronic problems   #HFpEF Last echocardiogram on 04/15/23 showed EF of 60-65% with grade 1 diastolic dysfunction. New echo on 7/14 showed left ventricular EF of >75% with hyperdynamic function and no diastolic dysfunction. She was continued on Home lasix  40mg  and Jardiance .   #Essential Hypertension  Came in with chest pain and blurry vision, BP elevated to 197/117 in the setting of missed medications. Home medications Olmesartan . BP today 134/76. Started on Amlodipine  5mg  for esophageal spasm but may assist with hypertension.   #Hx of CVA -Aspirin  81mg  -Atorvastatin  40mg    #Depression -Home Cymbalta  30mg    #COPD with chronic respiratory failure #Current smoker During her stay, she required oxygen with BiPAP overnight to maintain saturations. Continue home albuterol , singular, dulera , spiriva . She has previously used a CPAP at home but this has become unusable to her due to the hosing.

## 2024-05-18 NOTE — ED Notes (Signed)
 Phlebotomy to stick

## 2024-05-18 NOTE — Progress Notes (Addendum)
 HD#0 SUBJECTIVE:  Patient Summary: Cathy Hall is a 53 y.o. with a pertinent PMH of HTN, HLD, HFpEF, COPD, prior CVA, PAD, DMII, who presented with chest pain and admitted for unstable angina and NSTEMI ruleout.   Overnight Events: none  Interim History: Patient evaluated at bedside with family and emotional support animal present. Reports 3 days of chest pain. States has been worse overnight. Rates chest pain of 7/10 now. Laying down has helped. Sitting up and leaning forward no longer helps. Appetite has been decreased. Endorses nausea but no vomiting. Nitroglycerin  has helped.   Belching leads to undigested food coming up. Endorses GERD symptoms.   She does not have frequent bowel movements.  States brother passed away suddenly from reported heart attack.   OBJECTIVE:  Vital Signs: Vitals:   05/18/24 1000 05/18/24 1015 05/18/24 1030 05/18/24 1045  BP:  (!) 132/90 (!) 161/94 134/86  Pulse: (!) 105 (!) 102 (!) 111 (!) 104  Resp: 18 17 15 15   Temp:      TempSrc:      SpO2: 100% 100% 97% 100%   Supplemental O2: Nasal Cannula SpO2: 100 % O2 Flow Rate (L/min): 2 L/min  No intake or output data in the 24 hours ending 05/18/24 1136 Net IO Since Admission: No IO data has been entered for this period [05/18/24 1136]  Physical Exam: Physical Exam Constitutional:      Appearance: She is not ill-appearing.     Comments: In pain  HENT:     Mouth/Throat:     Mouth: Mucous membranes are moist.  Neck:     Vascular: No JVD.  Cardiovascular:     Rate and Rhythm: Regular rhythm. Tachycardia present.     Pulses:          Dorsalis pedis pulses are 2+ on the right side and 2+ on the left side.     Heart sounds: No murmur heard.    No friction rub. No gallop.  Pulmonary:     Effort: No respiratory distress.     Breath sounds: Normal breath sounds.  Abdominal:     General: Bowel sounds are normal. There is distension.     Tenderness: There is abdominal tenderness in  the epigastric area and left upper quadrant.     Comments: Tympanic to percussion LUQ and epigastric region. Dullness to percussion in RUQ and lower quadrants.  Musculoskeletal:     Right lower leg: No edema.     Left lower leg: No edema.  Skin:    General: Skin is warm.  Neurological:     Mental Status: She is alert.      Patient Lines/Drains/Airways Status     Active Line/Drains/Airways     Name Placement date Placement time Site Days   Peripheral IV 05/18/24 20 G 1 Posterior;Left Hand 05/18/24  0014  Hand  less than 1            Pertinent labs and imaging:   Latest Reference Range & Units 05/18/24 10:41  Troponin I (High Sensitivity) <18 ng/L 36 (H)  (H): Data is abnormally high    Latest Ref Rng & Units 05/18/2024    1:30 AM 05/17/2024    6:35 AM 01/15/2024    1:47 PM  CBC  WBC 4.0 - 10.5 K/uL 10.1  10.5  8.9   Hemoglobin 12.0 - 15.0 g/dL 86.3  85.1  85.2   Hematocrit 36.0 - 46.0 % 43.0  44.4  44.9   Platelets 150 -  400 K/uL 203  207  223        Latest Ref Rng & Units 05/18/2024    6:16 AM 05/18/2024    1:30 AM 05/17/2024    6:35 AM  CMP  Glucose 70 - 99 mg/dL 764  783  809   BUN 6 - 20 mg/dL 15  16  22    Creatinine 0.44 - 1.00 mg/dL 8.96  8.96  8.78   Sodium 135 - 145 mmol/L 136  136  137   Potassium 3.5 - 5.1 mmol/L 4.5  4.3  4.7   Chloride 98 - 111 mmol/L 100  98  97   CO2 22 - 32 mmol/L 25  26  27    Calcium  8.9 - 10.3 mg/dL 9.9  9.9  88.1   Total Protein 6.5 - 8.1 g/dL 6.6     Total Bilirubin 0.0 - 1.2 mg/dL 0.6     Alkaline Phos 38 - 126 U/L 77     AST 15 - 41 U/L 26     ALT 0 - 44 U/L 17       CT Angio Chest PE W and/or Wo Contrast Result Date: 05/17/2024 CLINICAL DATA:  Pulmonary embolism (PE) suspected, low to intermediate prob, positive D-dimer EXAM: CT ANGIOGRAPHY CHEST WITH CONTRAST TECHNIQUE: Multidetector CT imaging of the chest was performed using the standard protocol during bolus administration of intravenous contrast. Multiplanar CT image  reconstructions and MIPs were obtained to evaluate the vascular anatomy. RADIATION DOSE REDUCTION: This exam was performed according to the departmental dose-optimization program which includes automated exposure control, adjustment of the mA and/or kV according to patient size and/or use of iterative reconstruction technique. CONTRAST:  80mL OMNIPAQUE  IOHEXOL  350 MG/ML SOLN COMPARISON:  April 11, 2023 FINDINGS: Cardiovascular: Evaluation is limited secondary to motion and body habitus. Satisfactory opacification of the pulmonary arteries to the segmental level. No evidence of pulmonary embolism. Heart is the upper limits of normal in size. No pericardial effusion. Mild atherosclerotic calcifications. Mediastinum/Nodes: Prominent appearance of the thyroid  without discrete lesion. No axillary or mediastinal adenopathy. Lungs/Pleura: Minimal underlying emphysema. No pleural effusion or pneumothorax. No focal consolidation. Upper Abdomen: Small hiatal hernia. Marked distension of the stomach with fluid. Favored underlying hepatic steatosis. Musculoskeletal: No chest wall abnormality. No acute or significant osseous findings. Review of the MIP images confirms the above findings. IMPRESSION: 1. No evidence of pulmonary embolism. 2. Aorta underlying hepatic steatosis. 3. Distension of the stomach with fluid. Aortic Atherosclerosis (ICD10-I70.0) and Emphysema (ICD10-J43.9). Electronically Signed   By: Corean Salter M.D.   On: 05/17/2024 14:15    ASSESSMENT/PLAN:  Assessment: Principal Problem:   Unstable angina (HCC) Active Problems:   Essential hypertension   History of CVA (cerebrovascular accident)   Chronic obstructive pulmonary disease (HCC)   MDD (major depressive disorder), recurrent, severe, with psychosis (HCC)   Cigarette smoker   Type 2 diabetes mellitus with complication, with long-term current use of insulin  (HCC)   Obesity hypoventilation syndrome (HCC)   (HFpEF) heart failure with preserved  ejection fraction (HCC)  Cathy Hall is a 53 y.o. with a pertinent PMH of HTN, HLD, HFpEF, COPD, prior CVA, PAD, DMII, who presented with chest pain and admitted for unstable angina and NSTEMI ruleout.   Plan: #Unstable angina #Tropinemia #HFpEF  Has had recurrent chest pain prompting a separate visit to the ED two days prior to her admission. CTA was negative for a PE. Chest x-ray showed potential cardiomegaly. EKG with sinus tachycardia. Differential includes  ACS, COPD exacerbation, pericarditis, gastroparesis, and heart failure exacerbation. Heart score 5 on admission. Troponins elevated and rising to 30 this morning at 0400. Repeat troponin at 1040 was 37. BNP WNL. Was not started on heparin in the ED given only mild troponin elevation. Cardiology consulted and are not concerned for ACS at this time and would like to start empiric treatment for pericarditis.  Plan: -Trend troponins until peak -Echocardiogram ordered. -Cardiology consulted, awaiting recommendations -Nitroglycerin  0.4mg  PRN -ESR, CRP ordered per cardiology due to suspicion of pericarditis -colchicine  0.6mg  BID -Ibuprofen  600mg  TID -Telemetry -Lidocaine  patch.  #HFpEF Last echocardiogram on 04/15/23 showed EF of 60-65% with grade 1 diastolic dysfunction  Plan: -Echocardiogram ordered -Home lasix  40mg  daily -Jardiance  10mg  daily -Start spironolactone prior to discharge per cardiology.  #Type 2 diabetes mellitus with complication, with long-term current use of insulin  #Gastroparesis She has had regurgitation with belching that has been worsening. Her symptoms worsened after switching from Victoza  to Mounjaro  1 month ago. CTA showed distended stomach with fluid. Physical exam showed distended abdomen and LUQ tympany. She has not had a large appetite, has had  and has not been regularly passing gas. Her history, imaging, and physical exam are consistent with gastroparesis, likely stemming from her GLP-1  use, but could also be a progression of her diabetes.  Plan: -Hold home Mounjaro  in the setting of possible Gastroparesis. -Continue home Jardiance  10 mg daily -15 units glargine -Sliding scale insulin  sensitive scale -CBG -Miralax  bowel regimen -Maalox once, can provide more if beneficial  #Essential Hypertension  Came in with chest pain and blurry vision, BP elevated to 197/117 in the setting of missed medications. Home medications Olmesartan  -Irbesartan  150mg  while inpatient  #Hx of CVA -Aspirin  81mg  -Atorvastatin  40mg   #Depression -Home Cymbalta  30mg   #COPD #Current smoker Continue home albuterol , singular, dulera , spiriva    #AKI (resolved) Mild AKI in the ED on 7/14. Cr elevated to 1.21 over baseline of 0.89. Now 1.03 -Monitor with daily BMP  #Obesity hypoventilation syndrome -Home CPAP  #GERD -Protonix  20mg  daily  Best Practice: Diet: Cardiac diet VTE: enoxaparin  (LOVENOX ) injection 40 mg Start: 05/18/24 1000 Code: Full  Disposition planning: DISPO: Anticipated discharge tomorrow or Wednesday to Home pending improvement of chest pain and normalization of troponins..  Signature:  Melvenia [facility]  11:36 AM, 05/18/2024  On Call pager [phone]

## 2024-05-18 NOTE — ED Triage Notes (Signed)
 Pt seen here yesterday for same symptoms with full work up including CT angio. Pt discharged. Pt presents tonight with chest tightness with chest tightness. Her RA sats were 89 with some wheezing. Pt smells heavily of mariajuana.  EMS gave deoneb.

## 2024-05-18 NOTE — H&P (Signed)
 Date: 05/18/2024               Patient Name:  Cathy Hall MRN: [id number]  DOB: 1971/01/11 Age / Sex: 53 y.o., female   PCP: Renne Homans, MD         Medical Service: Internal Medicine Teaching Service         Attending Physician: Dr. Mliss Pouch      First Contact: Melvenia Morrison, MD}    Second Contact: Dr. Ozell Nearing, DO          Pager Information: First Contact Pager: [phone]   Second Contact Pager: [phone]   SUBJECTIVE   Chief Complaint: of chest pain and SOB  History of Present Illness: Cathy Hall is a 53 y.o. female with PMH of HTN, HLD,HFpEF, prior CVA, PAD, type 2 diabetes presents with crushing chest pain that started about 3 days ago. Pain was mostly on the left side. The pain radiated to her left hand and up her jaw. She said it got progressively worse as the days passed and rated it 10/10 at this time. Pt's SOB also started around the same time as her CP. She went to the ED on 7/12 due to SOB were she was evaluated. Pt had increased troponin levels and PE was ruled out during that ED visit. She was asked to f/u with cardiology for her CP in the outpatient setting. Pt then came into ED the next day (07/14) for intense CP. Pt endorses burning sensation in her chest. She tried Tums and Prilosec but nothing seemed to help. Her CP is present at rest and during exertion. It gets worse with exertion. Heating pad helps the CP. She is on CPAP at home. Her symptoms have not been to this extent before. She also endorses headaches that started around 3-4 days. Pt endorses blurry vision when she is in pain. She has difficulty sleeping with her head flat. Her CP feels better when she is sitting and bending forward. Her appetite has been low as she is on Mounjaro . Her last meal was yesterday in the afternoon.  She denies any syncopal episodes.  She denies any fever or chills.  ED Course: Labs significant for elevated troponin at 27>>>30 EKG did not  show any diffuse ST-segment elevations or other findings  Received nitroglycerin  in ED which provided relief of CP Consulted cardiology  Meds:  Patient reported:  Tylenol  325 mg twice daily Albuterol  2 puffs every 4 hours as needed Aspirin  81 mg daily Lipitor 40 mg daily Premarin  vaginal cream Flexeril  10 mg 3 times daily as needed Voltaren  gel Cymbalta  20 mg daily Jardiance  10 mg daily Ferrous sulfate 325 mg daily-not taking  Fluticasone  nasal spray Lasix  40 mg daily Atarax  50 mg every 6 hours as needed Tresiba  20 units daily- 15  Lidocaine  patch Loratadine  10 mg daily- not taking  Melatonin 6 mg nightly Dulera  2 puffs twice daily- once a day Singulair  10 mg nightly Nicotine  patch- not taking  Olmesartan  20 mg daily Protonix  20 mg daily- not taking  Prilosec taking  Refresh eyedrops twice daily Pregabalin  50 mg 3 times daily- not taking  Spiriva  1 puff daily- does 2 puffs  Mounjaro  2.5 mg weekly, Tuesdays  Tramadol   50 mg every 12 hours as needed- unclear  Trazodone  50 mg daily  Past Medical History HTN, COPD, HLD, HFpEF, OHS, HFpEF, prior CVA, PAD, depression, asthma, type 2 diabetes  Past Surgical History Past Surgical History:  Procedure Laterality Date   BREAST  BIOPSY Left 10/22/2023   US  LT BREAST BX W LOC DEV 1ST LESION IMG BX SPEC US  GUIDE 10/22/2023 [facility] MAMMOGRAPHY   ECTOPIC PREGNANCY SURGERY      Social:  Lives with: Fiance, granddaughter, and fiance's dad  Support: Good support in her family  Level of function: Not able to walk well because she has right leg that was injured 4 years ago with tears in multiple ligaments, but is able to dress herself, cook, clean, drive, and bath herself. She is able to do all of her grocery shopping, and independent in all of ADLs and iADLs  PCP: Dr. Drue Grow, MD  Substances: Cigarettes 8-9 for 20 years, glass of wine here and there, uses mariajuana daily    Family History: MI in mother  and father, and Sudden  cardiac death in brother at 62.  Family History  Problem Relation Age of Onset   Cerebral aneurysm Mother    Diabetes Sister    Other Neg Hx      Allergies: Allergies as of 05/17/2024 - Review Complete 05/17/2024  Allergen Reaction Noted   Nsaids Other (See Comments) 11/27/2018    Review of Systems: A complete ROS was negative except as per HPI.   OBJECTIVE:   Physical Exam: Blood pressure 118/82, pulse (!) 109, temperature 97.9 F (36.6 C), temperature source Oral, resp. rate 19, SpO2 100%.  Constitutional: obese female in mild distress on 2 L [place] HENT: normocephalic atraumatic, mucous membranes moist Neck: supple, no JVD appreciated Cardiovascular: Tachycardic rate, regular rhythm, no murmurs, rubs, gallops Pulmonary/Chest: labored work of breathing on 2L [place], lungs clear to auscultation bilaterally Abdominal: soft, non-tender, non-distended MSK: normal bulk and tone Skin: warm and dry Extremities: No edema in lower extremities. DP pulses: 2+ BL  Labs: CBC    Component Value Date/Time   WBC 10.1 05/18/2024 0130   RBC 4.42 05/18/2024 0130   HGB 13.6 05/18/2024 0130   HCT 43.0 05/18/2024 0130   PLT 203 05/18/2024 0130   MCV 97.3 05/18/2024 0130   MCH 30.8 05/18/2024 0130   MCHC 31.6 05/18/2024 0130   RDW 13.6 05/18/2024 0130   LYMPHSABS 3.0 05/18/2024 0130   MONOABS 0.8 05/18/2024 0130   EOSABS 0.3 05/18/2024 0130   BASOSABS 0.1 05/18/2024 0130     CMP     Component Value Date/Time   NA 136 05/18/2024 0130   NA 141 05/13/2023 1636   K 4.3 05/18/2024 0130   CL 98 05/18/2024 0130   CO2 26 05/18/2024 0130   GLUCOSE 216 (H) 05/18/2024 0130   BUN 16 05/18/2024 0130   BUN 10 05/13/2023 1636   CREATININE 1.03 (H) 05/18/2024 0130   CALCIUM  9.9 05/18/2024 0130   PROT 6.6 11/22/2021 1127   ALBUMIN 3.3 (L) 11/22/2021 1127   AST 17 11/22/2021 1127   ALT 11 11/22/2021 1127   ALKPHOS 70 11/22/2021 1127   BILITOT 0.4 11/22/2021 1127   GFRNONAA >60 05/18/2024 0130    GFRAA >60 08/07/2020 1144    Imaging: echocardiogram on 04/15/2023 which showed ejection fraction of 60-65% and grade 1 diastolic dysfunction   CT Angio Chest PE W and/or Wo Contrast Result Date: 05/17/2024 CLINICAL DATA:  Pulmonary embolism (PE) suspected, low to intermediate prob, positive D-dimer EXAM: CT ANGIOGRAPHY CHEST WITH CONTRAST TECHNIQUE: Multidetector CT imaging of the chest was performed using the standard protocol during bolus administration of intravenous contrast. Multiplanar CT image reconstructions and MIPs were obtained to evaluate the vascular anatomy. RADIATION DOSE REDUCTION: This  exam was performed according to the departmental dose-optimization program which includes automated exposure control, adjustment of the mA and/or kV according to patient size and/or use of iterative reconstruction technique. CONTRAST:  80mL OMNIPAQUE  IOHEXOL  350 MG/ML SOLN COMPARISON:  April 11, 2023 FINDINGS: Cardiovascular: Evaluation is limited secondary to motion and body habitus. Satisfactory opacification of the pulmonary arteries to the segmental level. No evidence of pulmonary embolism. Heart is the upper limits of normal in size. No pericardial effusion. Mild atherosclerotic calcifications. Mediastinum/Nodes: Prominent appearance of the thyroid  without discrete lesion. No axillary or mediastinal adenopathy. Lungs/Pleura: Minimal underlying emphysema. No pleural effusion or pneumothorax. No focal consolidation. Upper Abdomen: Small hiatal hernia. Marked distension of the stomach with fluid. Favored underlying hepatic steatosis. Musculoskeletal: No chest wall abnormality. No acute or significant osseous findings. Review of the MIP images confirms the above findings. IMPRESSION: 1. No evidence of pulmonary embolism. 2. Aorta underlying hepatic steatosis. 3. Distension of the stomach with fluid. Aortic Atherosclerosis (ICD10-I70.0) and Emphysema (ICD10-J43.9). Electronically Signed   By: Corean Salter M.D.   On: 05/17/2024 14:15   DG Chest Port 1 View Result Date: 05/17/2024 CLINICAL DATA:  Chest pain. EXAM: PORTABLE CHEST 1 VIEW COMPARISON:  AP Lat chest 04/10/2023 FINDINGS: The lungs are expiratory. The heart appears enlarged but is probably exaggerated by low inspiration. No vascular congestion is seen. Visualized lungs are clear but with limited view of the bases. There is mild calcification in the transverse aorta. The mediastinum is normally outlined. There is thoracic spondylosis.  Overlying monitor wires. IMPRESSION: 1. Expiratory chest with limited view of the bases. No evidence of acute chest disease as far as visualized. 2. Aortic atherosclerosis. 3. Possible cardiomegaly versus exaggerated heart silhouette. Electronically Signed   By: Francis Quam M.D.   On: 05/17/2024 06:40    EKG: EKG revealing sinus tachycardia, normal axis, no obvious ST segment changes.  When compared to previous EKG, no acute changes appreciated  ASSESSMENT & PLAN:   Assessment & Plan by Problem: Principal Problem:   Unstable angina (HCC) Active Problems:   Essential hypertension   History of CVA (cerebrovascular accident)   Chronic obstructive pulmonary disease (HCC)   MDD (major depressive disorder), recurrent, severe, with psychosis (HCC)   Cigarette smoker   Type 2 diabetes mellitus with complication, with long-term current use of insulin  (HCC)   Obesity hypoventilation syndrome (HCC)   (HFpEF) heart failure with preserved ejection fraction (HCC)   Aerin Evet Kofman is a 53 y.o. person living with a history of HTN, Diabetes, COPD, HLD, HFpEF, OHS, HFpEF, prior CVA, PAD, depression, asthma who presented with chest pain and SOB and admitted for high suspicion of unstable angina on hospital day 0  Unstable angina [facility]) Patient presents for 3-4 days of typical chest pain with rest and exertion.  Highly concerning for unstable angina.  Improving with nitroglycerin .  On my exam,  patient not volume overloaded, which makes less suspicion for heart failure exacerbation.  Patient has no fever or chills, which makes less suspicion for pneumonia.  Do not think this is related to a PE, dissection, or esophageal rupture.  Patient does have rising troponin, concerning for potential NSTEMI.  Will admit for unstable angina and trend troponins and evaluate with echocardiogram. -- Elevated troponins. Overnight results: 27-->30 (latest result) --Trending troponins; will monitor results again at 7AM --Cardiology consulted for further management and hospital course --Echocardiogram pending --Telemetry --Aspirin  81 mg daily --Lipitor 40 mg daily --Lidocaine  patch offered  (HFpEF) heart failure  with preserved ejection fraction (HCC) Most recent echocardiogram on 04/15/2023 which showed ejection fraction of 60-65% and grade 1 diastolic dysfunction.  On my exam, patient does not look grossly volume overloaded.  Do not think patient needs IV diuresis. --Echo pending --Continue Lasix  40 mg daily    Essential hypertension Blood pressures in the ED initially elevated into the 190s, but on exam decreased down to 118/92.  No acute concerns at this time.  Patient's blurry vision could be attributed to uncontrolled hypertension.  As this is transient, I do not think this is an acute stroke or ischemic event. --Continue home medications: Olmesartan  20 mg (hospital formulary irbesartan  150 mg daily)  History of CVA (cerebrovascular accident) --Continue taking homemeds: Asprin, lipitor    Chronic obstructive pulmonary disease (HCC) --Continue home meds: albuterol , singular, dulera , spiriva   MDD (major depressive disorder), recurrent, severe, with psychosis (HCC) -- Continue home Cymbalta  30 mg daily  Cigarette smoker --Need to discuss and educate pt about harmful effects of smoking --Nicotine  patches can be recommended    Type 2 diabetes mellitus with complication, with long-term current  use of insulin  (HCC) --Continue home Jardiance  10 mg daily --Resume home Tresiba  15 units daily (hospital formulary semglee  15 units daily) --Sliding scale insulin  --Hold home Mounjaro  (can take on Tuesday 2.5 mg)   Obesity hypoventilation syndrome (HCC) --Continue taking c-pap nightly  GERD --Continue Protonix  20 mg daily  Best practice: Diet: Normal VTE: Enoxaparin  IVF: N/A Code: Full  Disposition planning: Prior to Admission Living Arrangement: home; living with fiance, fiance's father, granddaughter  Anticipated Discharge Location: home  Dispo: Admit patient to Observation with expected length of stay less than 2 midnights.  Signed: Tobie Gaines, DO Internal Medicine Resident  05/18/2024, 6:30 AM  Please contact IM Residency On-Call Pager at: [phone] or [phone].

## 2024-05-18 NOTE — Progress Notes (Signed)
 Echocardiogram 2D Echocardiogram has been performed.  Cathy Hall 05/18/2024, 5:17 PM

## 2024-05-18 NOTE — ED Notes (Signed)
 Got patient back on the monitor patient is sitting up eating with family at bedside

## 2024-05-18 NOTE — ED Notes (Signed)
 Checked patient cbg it was 41 notified RN of blood sugar patient is resting with family at bedside and call bell in reach

## 2024-05-18 NOTE — ED Provider Notes (Signed)
 [place] EMERGENCY DEPARTMENT AT [facility] - Closed Provider Note   CSN: [id number] Arrival date & time: 05/18/24  0007     Patient presents with: Shortness of Breath   Cathy Hall is a 53 y.o. female.   The history is provided by the patient.  Shortness of Breath She has history of hypertension, heart failure with preserved ejection fraction, COPD, asthma, stroke and comes in because of recurrent chest pain.  She was in the emergency department yesterday with chest pain which resolved if she got morphine  in the emergency department and was doing well until about 6 PM when pain recurred but worse.  Pain is in the upper sternal area and is described as a burning, pressure feeling.  Pain is worse when she lays flat.  There is associated dyspnea, nausea, diaphoresis.  She has also had a nonproductive cough for the last 3 days.  She denies fever but has had some chills.  Symptoms are worse when she lays flat.  She denies any sick contacts.  She took Tums at home with no relief.  She received no medication in the ambulance.  She is a cigarette smoker but there is no history of diabetes or hyperlipidemia and there is no family history of premature coronary atherosclerosis.   Prior to Admission medications   Medication Sig Start Date End Date Taking? Authorizing Provider  acetaminophen  (TYLENOL ) 325 MG tablet Take 650 mg by mouth 2 (two) times daily as needed for fever, headache or moderate pain.    [provider]  albuterol  (VENTOLIN  HFA) 108 (90 Base) MCG/ACT inhaler Inhale 2 puffs into the lungs every 4 (four) hours as needed for wheezing or shortness of breath. 11/11/23 11/10/24  Masters, Katie, DO  aspirin  EC 81 MG tablet Take 1 tablet (81 mg total) by mouth daily. Swallow whole. 11/11/23 11/10/24  Masters, Katie, DO  atorvastatin  (LIPITOR) 40 MG tablet Take 1 tablet (40 mg total) by mouth daily. 11/11/23 11/10/24  Masters, Izetta, DO  conjugated estrogens  (PREMARIN ) vaginal  cream Apply one applicator INTRAVAGINALLY twice a week 03/26/24   Alexander-Savino, Washington, MD  cyclobenzaprine  (FLEXERIL ) 10 MG tablet Take 1 tablet (10 mg total) by mouth 3 (three) times daily as needed for muscle spasms. 04/27/24   Amoako, Prince, MD  diclofenac  Sodium (VOLTAREN ) 1 % GEL Apply 1 Application topically 4 (four) times daily as needed (pain). 11/11/23   Masters, Katie, DO  DULoxetine  (CYMBALTA ) 30 MG capsule Take 1 capsule (30 mg total) by mouth daily. 05/06/24 05/06/25  Amoako, Prince, MD  empagliflozin  (JARDIANCE ) 10 MG TABS tablet Take 1 tablet (10 mg total) by mouth daily before breakfast. 02/27/24   Elnora Ip, MD  Ferrous Sulfate (IRON PO) Take 1 tablet by mouth daily.    [provider]  fluticasone  (FLONASE ) 50 MCG/ACT nasal spray PLACE 2 SPRAYS INTO BOTH NOSTRILS DAILY. Patient taking differently: Place 2 sprays into both nostrils daily as needed for allergies. 11/30/20 06/15/23  Sherrill Cable Latif, DO  furosemide  (LASIX ) 40 MG tablet Take 1 tablet (40 mg total) by mouth daily. 05/06/24 05/06/25  Amoako, Prince, MD  hydrOXYzine  (ATARAX ) 50 MG tablet Take 1 tablet (50 mg total) by mouth every 6 (six) hours as needed for anxiety. 11/11/23   Masters, Katie, DO  insulin  degludec (TRESIBA ) 100 UNIT/ML FlexTouch Pen Inject 20 Units into the skin daily. 01/02/24   Tobie Gaines, DO  insulin  glargine (LANTUS ) 100 UNIT/ML Solostar Pen Inject 15 Units into the skin daily. 11/29/23  Masters, Psychiatric nurse, DO  lidocaine  (LIDODERM ) 5 % Place 1 patch onto the skin every 12 (twelve) hours. Remove & Discard patch within 12 hours or as directed by MD 11/11/23 11/10/24  Masters, Izetta, DO  liraglutide  (VICTOZA ) 18 MG/3ML SOPN Inject 1.2 mg into the skin daily. 12/16/23   Tobie Gaines, DO  loratadine  (CLARITIN  REDITABS) 10 MG dissolvable tablet Place 1 tablet (10 mg total) on the tongue and allow to dissolve daily. 11/11/23   Masters, Katie, DO  melatonin 3 MG TABS tablet Take 2 tablets (6 mg total) by  mouth at bedtime. 11/29/21   McQuilla, Jai B, MD  mometasone -formoterol  (DULERA ) 100-5 MCG/ACT AERO Inhale 2 puffs into the lungs 2 (two) times daily. 11/11/23 11/10/24  Masters, Katie, DO  montelukast  (SINGULAIR ) 10 MG tablet Take 1 tablet (10 mg total) by mouth at bedtime. 11/11/23 11/10/24  Masters, Katie, DO  montelukast  (SINGULAIR ) 10 MG tablet Take 1 tablet (10 mg total) by mouth at bedtime. 11/04/23   Kassie Acquanetta Bradley, MD  Multiple Vitamin (MULTIVITAMIN WITH MINERALS) TABS tablet Take 1 tablet by mouth daily. 11/30/20   Sheikh, Alejandro Latif, DO  nicotine  (NICODERM CQ  - DOSED IN MG/24 HOURS) 14 mg/24hr patch Place 1 patch (14 mg total) onto the skin daily. 11/12/23   Masters, Katie, DO  olmesartan  (BENICAR ) 20 MG tablet Take 1 tablet (20 mg total) by mouth daily. 11/11/23   Masters, Katie, DO  pantoprazole  (PROTONIX ) 20 MG tablet Take 1 tablet (20 mg total) by mouth daily. 05/17/24   Tran, Bowie, PA-C  Polyvinyl Alcohol -Povidone (REFRESH OP) Place 1 drop into both eyes 2 (two) times daily as needed (dryness).    [provider]  pregabalin  (LYRICA ) 50 MG capsule Take 1 capsule (50 mg total) by mouth 3 (three) times daily. 11/11/23   Masters, Katie, DO  tiotropium (SPIRIVA ) 18 MCG inhalation capsule Place 1 capsule (18 mcg total) into handihaler and inhale daily. *do not swallow capsule* 12/10/23   Kassie Acquanetta Bradley, MD  tirzepatide  (MOUNJARO ) 2.5 MG/0.5ML Pen Inject 2.5 mg into the skin once a week. 03/09/24 02/08/25  Masters, Katie, DO  tirzepatide  (MOUNJARO ) 2.5 MG/0.5ML Pen Inject 2.5 mg into the skin once a week. 02/27/24   Gomez-Caraballo, Maria, MD  traMADol  (ULTRAM ) 50 MG tablet TAKE 2 TABLETS (100 MG TOTAL) BY MOUTH EVERY 12 (TWELVE) HOURS AS NEEDED. 12/11/23   Masters, Izetta, DO  traZODone  (DESYREL ) 50 MG tablet Take 1 tablet (50 mg total) by mouth at bedtime. 04/27/24   Renne Homans, MD    Allergies: Nsaids    Review of Systems  Respiratory:  Positive for shortness of breath.   All other systems  reviewed and are negative.   Updated Vital Signs BP (!) 149/85 (BP Location: Left Arm)   Pulse (!) 109   Temp 98.9 F (37.2 C) (Oral)   Resp 16   SpO2 93%   Physical Exam Vitals and nursing note reviewed.   53 year old female, resting comfortably and in no acute distress. Vital signs are significant for elevated heart rate and blood pressure. Oxygen saturation is 93%, which is normal. Head is normocephalic and atraumatic. PERRLA, EOMI. Oropharynx is clear. Neck is nontender and supple without adenopathy or JVD. Back is nontender and there is no CVA tenderness. Lungs are bibasilar rales and a prolonged exhalation phase without overt wheezes or rhonchi. Heart has regular rate and rhythm with 2/6 systolic ejection murmur. Abdomen is soft, flat, nontender. Extremities have trace edema. Skin is warm and  dry without rash. Neurologic: Awake and alert, moves all extremities equally.  (all labs ordered are listed, but only abnormal results are displayed) Labs Reviewed  BASIC METABOLIC PANEL WITH GFR - Abnormal; Notable for the following components:      Result Value   Glucose, Bld 216 (*)    Creatinine, Ser 1.03 (*)    All other components within normal limits  TROPONIN I (HIGH SENSITIVITY) - Abnormal; Notable for the following components:   Troponin I (High Sensitivity) 27 (*)    All other components within normal limits  RESP PANEL BY RT-PCR (RSV, FLU A&B, COVID)  [id number]  CBC WITH DIFFERENTIAL/PLATELET  BRAIN NATRIURETIC PEPTIDE  TROPONIN I (HIGH SENSITIVITY)    EKG: EKG Interpretation Date/Time:  Monday May 18 2024 01:52:46 EDT Ventricular Rate:  109 PR Interval:  142 QRS Duration:  64 QT Interval:  330 QTC Calculation: 444 R Axis:   24  Text Interpretation: Sinus tachycardia Possible Left atrial enlargement Low voltage QRS Borderline ECG When compared with ECG of 17-May-2024 06:34, No significant change was found Confirmed by Raford Lenis ([id number]) on 05/18/2024 2:01:46  AM  Radiology: CT Angio Chest PE W and/or Wo Contrast Result Date: 05/17/2024 CLINICAL DATA:  Pulmonary embolism (PE) suspected, low to intermediate prob, positive D-dimer EXAM: CT ANGIOGRAPHY CHEST WITH CONTRAST TECHNIQUE: Multidetector CT imaging of the chest was performed using the standard protocol during bolus administration of intravenous contrast. Multiplanar CT image reconstructions and MIPs were obtained to evaluate the vascular anatomy. RADIATION DOSE REDUCTION: This exam was performed according to the departmental dose-optimization program which includes automated exposure control, adjustment of the mA and/or kV according to patient size and/or use of iterative reconstruction technique. CONTRAST:  80mL OMNIPAQUE  IOHEXOL  350 MG/ML SOLN COMPARISON:  April 11, 2023 FINDINGS: Cardiovascular: Evaluation is limited secondary to motion and body habitus. Satisfactory opacification of the pulmonary arteries to the segmental level. No evidence of pulmonary embolism. Heart is the upper limits of normal in size. No pericardial effusion. Mild atherosclerotic calcifications. Mediastinum/Nodes: Prominent appearance of the thyroid  without discrete lesion. No axillary or mediastinal adenopathy. Lungs/Pleura: Minimal underlying emphysema. No pleural effusion or pneumothorax. No focal consolidation. Upper Abdomen: Small hiatal hernia. Marked distension of the stomach with fluid. Favored underlying hepatic steatosis. Musculoskeletal: No chest wall abnormality. No acute or significant osseous findings. Review of the MIP images confirms the above findings. IMPRESSION: 1. No evidence of pulmonary embolism. 2. Aorta underlying hepatic steatosis. 3. Distension of the stomach with fluid. Aortic Atherosclerosis (ICD10-I70.0) and Emphysema (ICD10-J43.9). Electronically Signed   By: Corean Salter M.D.   On: 05/17/2024 14:15   DG Chest Port 1 View Result Date: 05/17/2024 CLINICAL DATA:  Chest pain. EXAM: PORTABLE CHEST 1  VIEW COMPARISON:  AP Lat chest 04/10/2023 FINDINGS: The lungs are expiratory. The heart appears enlarged but is probably exaggerated by low inspiration. No vascular congestion is seen. Visualized lungs are clear but with limited view of the bases. There is mild calcification in the transverse aorta. The mediastinum is normally outlined. There is thoracic spondylosis.  Overlying monitor wires. IMPRESSION: 1. Expiratory chest with limited view of the bases. No evidence of acute chest disease as far as visualized. 2. Aortic atherosclerosis. 3. Possible cardiomegaly versus exaggerated heart silhouette. Electronically Signed   By: Francis Quam M.D.   On: 05/17/2024 06:40     Procedures  Cardiac monitor shows normal sinus rhythm, per my interpretation. Medications Ordered in the ED  aspirin  chewable tablet 324 mg (324 mg Oral  Not Given 05/18/24 0140)  nitroGLYCERIN  (NITROSTAT ) SL tablet 0.4 mg (0.4 mg Sublingual Given 05/18/24 0138)  nitroGLYCERIN  (NITROGLYN) 2 % ointment 1 inch (has no administration in time range)                 HEART Score: 5                    Medical Decision Making Amount and/or Complexity of Data Reviewed Labs: ordered.  Risk OTC drugs. Prescription drug management. Decision regarding hospitalization.   Chest pain with shortness of breath and cough.  This is a presentation with a wide range of treatment options and carries with it a high risk of morbidity and complications.  Differential diagnosis includes, but is not limited to, ACS, pneumonia, pulmonary embolism, GERD, asthma exacerbation, COPD exacerbation.  I have reviewed her past records, and note ED visit yesterday at which time labs showed normal CBC, minimal troponin elevation which was stable, normal BNP, mild renal insufficiency which have been present in the past, elevated random glucose level, elevated calcium  which is new.  CT angiogram showed no evidence of pulmonary embolism or pneumonia.  Exam is  suggestive of some degree of heart failure.  I note echocardiogram on 04/15/2023 which showed ejection fraction of 60-65% and grade 1 diastolic dysfunction.  I have ordered repeat ECG and labs and I have ordered a dose of aspirin  and nitroglycerin .  I have reviewed her electrocardiogram, my interpretation is sinus tachycardia and low voltage, unchanged from ECG of 05/17/2024.  I have reviewed her laboratory tests, and my interpretation is elevated random glucose level, minimally elevated creatinine level which is actually improved compared with yesterday, normal CBC, normal BNP.  Troponin is elevated and increased compared with yesterday.  Because of rising troponin, she will need to be admitted.  She apparently got excellent relief with nitroglycerin  as she is now sleeping.  I have ordered topical nitroglycerin .  However, with minimal troponin elevation, I have elected to hold off on heparin administration.  Heart score is 5 which puts her at moderate to high risk for major adverse cardiac events in the next 6 weeks.  I have discussed case with Dr. Tobie of internal medicine teaching service who agrees to admit the patient.  I have also discussed the case with Dr. Cesario of cardiology service who agrees to see the patient in consultation.  CRITICAL CARE Performed by: Alm Lias Total critical care time: 45 minutes Critical care time was exclusive of separately billable procedures and treating other patients. Critical care was necessary to treat or prevent imminent or life-threatening deterioration. Critical care was time spent personally by me on the following activities: development of treatment plan with patient and/or surrogate as well as nursing, discussions with consultants, evaluation of patient's response to treatment, examination of patient, obtaining history from patient or surrogate, ordering and performing treatments and interventions, ordering and review of laboratory studies, ordering and review  of radiographic studies, pulse oximetry and re-evaluation of patient's condition.     Final diagnoses:  Nonspecific chest pain  Elevated troponin  Elevated random blood glucose level  Elevated blood pressure reading with diagnosis of hypertension    ED Discharge Orders     None          Lias Alm, MD 05/18/24 [phone]
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
## 2024-05-19 DIAGNOSIS — F17210 Nicotine dependence, cigarettes, uncomplicated: Secondary | ICD-10-CM

## 2024-05-19 DIAGNOSIS — I5030 Unspecified diastolic (congestive) heart failure: Secondary | ICD-10-CM

## 2024-05-19 DIAGNOSIS — I110 Hypertensive heart disease with heart failure: Secondary | ICD-10-CM

## 2024-05-19 DIAGNOSIS — E1143 Type 2 diabetes mellitus with diabetic autonomic (poly)neuropathy: Secondary | ICD-10-CM

## 2024-05-19 DIAGNOSIS — Z8673 Personal history of transient ischemic attack (TIA), and cerebral infarction without residual deficits: Secondary | ICD-10-CM

## 2024-05-19 DIAGNOSIS — K3184 Gastroparesis: Secondary | ICD-10-CM | POA: Diagnosis not present

## 2024-05-19 DIAGNOSIS — J449 Chronic obstructive pulmonary disease, unspecified: Secondary | ICD-10-CM | POA: Diagnosis not present

## 2024-05-19 DIAGNOSIS — R1013 Epigastric pain: Secondary | ICD-10-CM

## 2024-05-19 DIAGNOSIS — I200 Unstable angina: Secondary | ICD-10-CM | POA: Diagnosis not present

## 2024-05-19 DIAGNOSIS — R079 Chest pain, unspecified: Secondary | ICD-10-CM

## 2024-05-19 DIAGNOSIS — R0789 Other chest pain: Secondary | ICD-10-CM | POA: Diagnosis not present

## 2024-05-19 LAB — BASIC METABOLIC PANEL WITH GFR
Anion gap: 13 (ref 5–15)
BUN: 18 mg/dL (ref 6–20)
CO2: 26 mmol/L (ref 22–32)
Calcium: 9.8 mg/dL (ref 8.9–10.3)
Chloride: 97 mmol/L — ABNORMAL LOW (ref 98–111)
Creatinine, Ser: 1.2 mg/dL — ABNORMAL HIGH (ref 0.44–1.00)
GFR, Estimated: 55 mL/min — ABNORMAL LOW (ref >60)
Glucose, Bld: 182 mg/dL — ABNORMAL HIGH (ref 70–99)
Potassium: 4.6 mmol/L (ref 3.5–5.1)
Sodium: 136 mmol/L (ref 135–145)

## 2024-05-19 LAB — MRSA NEXT GEN BY PCR, NASAL: MRSA by PCR Next Gen: NOT DETECTED

## 2024-05-19 LAB — CBG MONITORING, ED: Glucose-Capillary: 167 mg/dL — ABNORMAL HIGH (ref 70–99)

## 2024-05-19 LAB — GLUCOSE, CAPILLARY
Glucose-Capillary: 186 mg/dL — ABNORMAL HIGH (ref 70–99)
Glucose-Capillary: 184 mg/dL — ABNORMAL HIGH (ref 70–99)
Glucose-Capillary: 188 mg/dL — ABNORMAL HIGH (ref 70–99)

## 2024-05-19 MED ORDER — NITROGLYCERIN 0.4 MG SL SUBL
0.4000 mg | SUBLINGUAL_TABLET | SUBLINGUAL | Status: DC | PRN
  Administered 2024-05-19 (×2): 0.4 mg via SUBLINGUAL
  Filled 2024-05-19: qty 1

## 2024-05-19 MED ORDER — HYOSCYAMINE SULFATE 0.125 MG SL SUBL
0.2500 mg | SUBLINGUAL_TABLET | Freq: Once | SUBLINGUAL | Status: AC
  Administered 2024-05-20: 0.25 mg via SUBLINGUAL
  Filled 2024-05-19: qty 2

## 2024-05-19 MED ORDER — METOCLOPRAMIDE HCL 5 MG PO TABS
5.0000 mg | ORAL_TABLET | Freq: Three times a day (TID) | ORAL | Status: DC
  Administered 2024-05-19 – 2024-05-21 (×9): 5 mg via ORAL
  Filled 2024-05-19 (×9): qty 1

## 2024-05-19 MED ORDER — POLYETHYLENE GLYCOL 3350 17 G PO PACK
17.0000 g | PACK | Freq: Every day | ORAL | Status: DC
  Administered 2024-05-19 – 2024-05-21 (×3): 17 g via ORAL
  Filled 2024-05-19 (×3): qty 1

## 2024-05-19 MED ORDER — SUCRALFATE 1 GM/10ML PO SUSP
1.0000 g | Freq: Three times a day (TID) | ORAL | Status: DC
  Administered 2024-05-19 – 2024-05-21 (×9): 1 g via ORAL
  Filled 2024-05-19 (×9): qty 10

## 2024-05-19 MED ORDER — METOCLOPRAMIDE HCL 5 MG/ML IJ SOLN: 5.0000 mg | Freq: Three times a day (TID) | INTRAMUSCULAR | Status: DC

## 2024-05-19 MED ORDER — DICLOFENAC SODIUM 1 % EX GEL
4.0000 g | Freq: Four times a day (QID) | CUTANEOUS | Status: DC
  Administered 2024-05-19 – 2024-05-21 (×7): 4 g via TOPICAL
  Filled 2024-05-19 (×2): qty 100

## 2024-05-19 MED ORDER — ALUM & MAG HYDROXIDE-SIMETH 200-200-20 MG/5ML PO SUSP: 30.0000 mL | Freq: Once | ORAL | Status: DC

## 2024-05-19 MED ORDER — SENNOSIDES-DOCUSATE SODIUM 8.6-50 MG PO TABS
1.0000 | ORAL_TABLET | Freq: Two times a day (BID) | ORAL | Status: DC
  Administered 2024-05-19 – 2024-05-21 (×5): 1 via ORAL
  Filled 2024-05-19 (×5): qty 1

## 2024-05-19 NOTE — Plan of Care (Signed)

## 2024-05-19 NOTE — ED Notes (Signed)
 Pt complains of chest pain in the center, describes it as a bad heartburn. States that the only thing that seemed to help some was the nitroglycerin  pills that was given previously.

## 2024-05-19 NOTE — ED Notes (Signed)
 Per pt chest pain has improved 5/10.

## 2024-05-19 NOTE — TOC Initial Note (Signed)
 Transition of Care [facility]) - Initial/Assessment Note    Patient Details  Name: Cathy Hall MRN: [id number] Date of Birth: 08-08-1971  Transition of Care [place]) CM/SW Contact:    Lauraine FORBES Saa, LCSW Phone Number: 05/19/2024, 3:17 PM  Clinical Narrative:           3:18 PM CSW introduced self and role to patient. CSW provided patient SDOH (housing, food, transportation) resources. Patient confirmed she resides at home with her significant other and her significant other's father but will have to move out August 2025. Patient confirmed her significant other can provide transportation upon discharge but prior to current hospitalization patient transported themselves to appointments via private vehicle. Patient stated that she has a cane, CPAP, rollator, and blood pressure cuffs at home (per chart review, also has 3 in 1, rolling walker, wheelchair, and BiPap, via [employer]). Patient expressed interest in new blood pressure cuffs, CPAP, and rollator as they are currently broken. Patient stated she she received rollator covered by insurance a year ago. CSW relayed interests to [facility]. Patient declined SNF history. Patient declined HH history and stated that [facility] was ordered but no showed (per chart review, HH agency was [facility]). Per chart review, patient has a PCP and insurance. Patient's preferred pharmacy is [facility].  Expected Discharge Plan: Home/Self Care Barriers to Discharge: Continued Medical Work up   Patient Goals and CMS Choice            Expected Discharge Plan and Services   Discharge Planning Services: CM Consult   Living arrangements for the past 2 months: Single Family Home                                      Prior Living Arrangements/Services Living arrangements for the past 2 months: Single Family Home Lives with:: Other (Comment), Significant Other (Significant Other's Father) Patient language  and need for interpreter reviewed:: Yes        Need for Family Participation in Patient Care: No (Comment) Care giver support system in place?: Yes (comment)   Criminal Activity/Legal Involvement Pertinent to Current Situation/Hospitalization: No - Comment as needed  Activities of Daily Living   ADL Screening (condition at time of admission) Independently performs ADLs?: Yes (appropriate for developmental age) Is the patient deaf or have difficulty hearing?: No Does the patient have difficulty seeing, even when wearing glasses/contacts?: No Does the patient have difficulty concentrating, remembering, or making decisions?: No  Permission Sought/Granted Permission sought to share information with : Family Supports Permission granted to share information with : No (Contact information on chart)  Share Information with NAME: Burnis Kick     Permission granted to share info w Relationship: Significant Other  Permission granted to share info w Contact Information: [phone]  Emotional Assessment Appearance:: Appears stated age Attitude/Demeanor/Rapport: Engaged Affect (typically observed): Accepting, Appropriate, Adaptable, Calm, Stable, Pleasant Orientation: : Oriented to Self, Oriented to Place, Oriented to  Time, Oriented to Situation Alcohol  / Substance Use: Not Applicable Psych Involvement: No (comment)  Admission diagnosis:  Unstable angina (HCC) [I20.0] Elevated troponin [R79.89] Elevated random blood glucose level [R73.9] Nonspecific chest pain [R07.9] Elevated blood pressure reading with diagnosis of hypertension [I10] Patient Active Problem List   Diagnosis Date Noted   Unstable angina (HCC) 05/18/2024   (HFpEF) heart failure with preserved ejection fraction (HCC) 05/18/2024   Onychocryptosis 08/28/2023  Healthcare maintenance 05/14/2023   Obesity hypoventilation syndrome (HCC) 05/14/2023   Type 2 diabetes mellitus with complication, with long-term current use of  insulin  (HCC) 04/26/2023   MDD (major depressive disorder), recurrent, severe, with psychosis (HCC)    PAD (peripheral artery disease) mild, left LE (HCC) 11/23/2020   Right knee pain    Chronic obstructive pulmonary disease (HCC)    BMI >50  with serious comorbidity and body mass index (BMI) of 50.0 to 59.9 in adult [facility]) 11/13/2020   History of CVA (cerebrovascular accident) 11/10/2020   Essential hypertension 11/05/2020   Polysubstance abuse (HCC) 11/06/2018   Cigarette smoker 10/21/2014   Mild persistent asthma without complication 03/28/2013   PCP:  Renne Homans, MD Pharmacy:   [facility] [address] [place] [address] Phone: [phone] Fax: [phone]     Social Drivers of Health (SDOH) Social History: SDOH Screenings   Food Insecurity: Food Insecurity Present (05/19/2024)  Housing: High Risk (05/19/2024)  Transportation Needs: Unmet Transportation Needs (05/19/2024)  Utilities: Not At Risk (05/19/2024)  Alcohol  Screen: Low Risk  (11/21/2021)  Depression (PHQ2-9): Low Risk  (03/26/2024)  Financial Resource Strain: Low Risk  (11/05/2018)   Received from [facility]  Physical Activity: Insufficiently Active (11/05/2018)   Received [facility]  Social Connections: Unknown (03/04/2023)   Received from [facility]  Stress: No Stress Concern Present (11/05/2018)   Received from [facility]  Tobacco Use: High Risk (05/18/2024)   SDOH Interventions: Food Insecurity Interventions: [employer] Provided, Inpatient TOC Housing Interventions: [employer] Provided, Inpatient [employer] Transportation Interventions: [employer] Provided, Patient Resources (Friends/Family), Payor Benefit, Inpatient TOC   Readmission Risk Interventions    04/21/2023   11:39 AM  Readmission Risk Prevention Plan  Transportation Screening Complete  Medication Review [employer])  Complete  HRI or Home Care Consult Complete  SW Recovery Care/Counseling Consult Complete  Palliative Care Screening Not Applicable

## 2024-05-19 NOTE — Progress Notes (Addendum)
 Progress Note  Patient Name: Cathy Hall Date of Encounter: 05/19/2024 [facility] Cardiologist: Oneil Parchment, MD   Interval Summary   Continues to have chest pain that is worse when laying down and better when burping.  Vital Signs Vitals:   05/19/24 0600 05/19/24 0718 05/19/24 0721 05/19/24 0800  BP: 137/73   113/84  Pulse: 98   94  Resp: 18   18  Temp:   98.3 F (36.8 C)   TempSrc:      SpO2: 100% 100%  98%   No intake or output data in the 24 hours ending 05/19/24 0943    05/17/2024    5:56 AM 03/26/2024    3:57 PM 02/27/2024    3:27 PM  Last 3 Weights  Weight (lbs) 240 lb 280 lb 283 lb 3.2 oz  Weight (kg) 108.863 kg 127.007 kg 128.459 kg      Telemetry/ECG   Sinus tachycardia with rates in the 100's - Personally Reviewed  Physical Exam  GEN: No acute distress.   Neck: No JVD Cardiac: RRR, no murmurs, rubs, or gallops.  Respiratory: Clear to auscultation bilaterally. GI: Soft, nontender, non-distended  MS: No edema  Assessment & Plan  Cathy Hall is a 53 y.o. female with a hx of polysubstance abuse (tobacco abuse, marijuana abuse, cocaine abuse), hypertension, hyperlipidemia, OSA on CPAP, type 2 diabetes, COPD, CHF, prior ischemic stroke, anxiety, and depression who is being seen 05/18/2024 for the evaluation of chest pain and elevated high-sensitivity troponins at the request of Trudy Dragon MD.   Chest pain Shortness of breath Elevated high-sensitivity troponin 27> 30> 36> 42>40 Coronary calcifications ESR and CRP are mildly elevated and not consistent with pericarditis.  Elevated high-sensitivity troponins are likely secondary to demand ischemia from COPD exacerbation.  About a month and a half ago was placed on Mounjaro  for weight loss. Chest pain is worse when laying down and better when sitting up.  Chest pain is likely related to GERD or musculoskeletal. Stop ibuprofen  and colchicine  as ESR and CRP are not consistent  with pericarditis, chest pain is likely non cardiac in nature and renal function has worsened slightly this am. GERD management per primary  Polysubstance abuse (tobacco abuse, marijuana abuse, cocaine abuse) COPD COPD exacerbation may be contributing to shortness of breath. Patient is unwilling to quit smoking at this time.  HFpEF Echocardiogram showed hyperdynamic LVEF greater than 75%, increased RV wall thickness, mild MAC, no pericardial effusion, normal RV function, and normal valve function.  Euvolemic on exam.  Continue home Lasix  40 mg daily GDMT Continue Jardiance  10 mg daily We will avoid starting spironolactone for now as renal function has worsened.  Otherwise manage per primary  [facility] will sign off.   The patient is ready for discharge today from a cardiac standpoint. Medication Recommendations:  Stop Ibuprofen  and colchicine   For questions or updates, please contact [facility] Please consult [email or web address] for contact info under       Signed, Morse Clause, PA-C   Personally seen and examined. Agree with above.  ESR and CRP were minimally elevated.  Usually in the setting of pericarditis they are quite high.  Because of this minimal elevation, my suspicion is that her chest discomfort that is worse when laying down and better when burping is secondary to gastroesophageal reflux disease.  This can go along with GLP administration as well.  Echocardiogram also reassuring with normal ejection fraction.  We will stop the colchicine  and  ibuprofen .  We will sign off.  Please let us  know if we can be of further assistance.  Oneil Parchment, MD

## 2024-05-19 NOTE — Progress Notes (Signed)
 HD#0 SUBJECTIVE:  Patient Summary: Cathy Hall is a 53 y.o. female with a pertinent PMH of HTN, HLD, HFpEF, COPD, prior CVA, PAD, DMII, who presented with chest pain and admitted for unstable angina and NSTEMI ruleout, now with gastroparesis and GERD symptoms.  Overnight Events: Continued chest pain overnight, received Nitroglycerin , Voltaren  gel, and a Lidocaine  patch. Troponins peaked at 42. Pulled out an IV per nursing.  Interim History: She reports crushing chest pain every 2 hours while sleeping, worse with lying down and improves when sitting up. Lidocaine  patch is not working. Patient feels that nitroglycerin  worked. Malox didn't work but feel like she is still having belching. Patient endorsing lots of flatulence. When chest pain happens, patient gets numbness in hands. Patient feels that chest pain goes back through her shoulder blades too. She also had a bowel movement yesterday that was very constipated.  Seen by cardiology who do not believe her pain to be ACS and have empirically started treatment for pericarditis.  OBJECTIVE:  Vital Signs: Vitals:   05/19/24 1030 05/19/24 1046 05/19/24 1100 05/19/24 1125  BP: (!) 138/109  (!) 164/115   Pulse: 93     Resp:   (!) 9   Temp:   98 F (36.7 C)   TempSrc:   Oral   SpO2: 100%  98%   Weight:  130.2 kg    Height:    5' 1 (1.549 m)   Supplemental O2: [employer]   05/19/24 1046  Weight: 130.2 kg    No intake or output data in the 24 hours ending 05/19/24 1353 Net IO Since Admission: No IO data has been entered for this period [05/19/24 1353]  Physical Exam: Physical Exam Constitutional:      General: She is not in acute distress.    Appearance: She is not ill-appearing.  HENT:     Head: Normocephalic.  Cardiovascular:     Rate and Rhythm: Tachycardia present.  Chest:     Chest wall: Tenderness present.  Abdominal:     General: Bowel sounds are normal. There is distension.      Tenderness: There is abdominal tenderness in the left upper quadrant.     Comments: Distension and tenderness improved compared to yesterday  Musculoskeletal:     Right lower leg: No edema.     Left lower leg: No edema.  Neurological:     Mental Status: She is alert.      Patient Lines/Drains/Airways Status     Active Line/Drains/Airways     None            Pertinent labs and imaging:      Latest Ref Rng & Units 05/18/2024    1:30 AM 05/17/2024    6:35 AM 01/15/2024    1:47 PM  CBC  WBC 4.0 - 10.5 K/uL 10.1  10.5  8.9   Hemoglobin 12.0 - 15.0 g/dL 86.3  85.1  85.2   Hematocrit 36.0 - 46.0 % 43.0  44.4  44.9   Platelets 150 - 400 K/uL 203  207  223        Latest Ref Rng & Units 05/19/2024    4:50 AM 05/18/2024    6:16 AM 05/18/2024    1:30 AM  CMP  Glucose 70 - 99 mg/dL 817  764  783   BUN 6 - 20 mg/dL 18  15  16    Creatinine 0.44 - 1.00 mg/dL 8.79  8.96  8.96   Sodium  135 - 145 mmol/L 136  136  136   Potassium 3.5 - 5.1 mmol/L 4.6  4.5  4.3   Chloride 98 - 111 mmol/L 97  100  98   CO2 22 - 32 mmol/L 26  25  26    Calcium  8.9 - 10.3 mg/dL 9.8  9.9  9.9   Total Protein 6.5 - 8.1 g/dL  6.6    Total Bilirubin 0.0 - 1.2 mg/dL  0.6    Alkaline Phos 38 - 126 U/L  77    AST 15 - 41 U/L  26    ALT 0 - 44 U/L  17      ECHOCARDIOGRAM COMPLETE Result Date: 05/18/2024    ECHOCARDIOGRAM REPORT   Patient Name:   Cathy Hall Date of Exam: 05/18/2024 Medical Rec #:  [id number]                   Height:       61.0 in Accession #:    [id number]                  Weight:       240.0 lb Date of Birth:  February 24, 1971                   BSA:          2.041 m Patient Age:    52 years                    BP:           134/86 mmHg Patient Gender: F                           HR:           100 bpm. Exam Location:  Inpatient Procedure: 2D Echo, Cardiac Doppler and Color Doppler (Both Spectral and Color            Flow Doppler were utilized during procedure). Indications:    Chest Pain  R07.9  History:        Patient has prior history of Echocardiogram examinations, most                 recent 04/15/2023. CHF, Angina, PAD and Stroke; Risk                 Factors:Hypertension, Diabetes and Current Smoker.  Sonographer:    Thea Norlander RCS Referring Phys: LIBBY BLANCH  Sonographer Comments: Technically difficult study due to poor echo windows. IMPRESSIONS  1. Left ventricular ejection fraction, by estimation, is >75%. The left ventricle has hyperdynamic function. The left ventricle has no regional wall motion abnormalities. Left ventricular diastolic parameters were normal.  2. Right ventricular systolic function is normal. The right ventricular size is normal.  3. The mitral valve is grossly normal. No evidence of mitral valve regurgitation. No evidence of mitral stenosis.  4. The aortic valve is normal in structure. Aortic valve regurgitation is not visualized. No aortic stenosis is present. Comparison(s): A prior study was performed on 04/15/2023. Reported: LVEF 60-65%, Grade I diastolic dysfunction, see report for additional details. FINDINGS  Left Ventricle: Left ventricular ejection fraction, by estimation, is >75%. The left ventricle has hyperdynamic function. The left ventricle has no regional wall motion abnormalities. The left ventricular internal cavity size was normal in size. There is borderline left ventricular hypertrophy. Left ventricular diastolic parameters were normal. Right Ventricle: The  right ventricular size is normal. No increase in right ventricular wall thickness. Right ventricular systolic function is normal. Left Atrium: Left atrial size was normal in size. Right Atrium: Right atrial size was normal in size. Pericardium: There is no evidence of pericardial effusion. Mitral Valve: The mitral valve is grossly normal. Mild mitral annular calcification. No evidence of mitral valve regurgitation. No evidence of mitral valve stenosis. Tricuspid Valve: The tricuspid valve is  normal in structure. Tricuspid valve regurgitation is trivial. No evidence of tricuspid stenosis. Aortic Valve: The aortic valve is normal in structure. Aortic valve regurgitation is not visualized. No aortic stenosis is present. Aortic valve peak gradient measures 10.6 mmHg. Pulmonic Valve: The pulmonic valve was normal in structure. Pulmonic valve regurgitation is trivial. No evidence of pulmonic stenosis. Aorta: The aortic root and ascending aorta are structurally normal, with no evidence of dilitation. Venous: The inferior vena cava was not well visualized. IAS/Shunts: The interatrial septum was not well visualized.  LEFT VENTRICLE PLAX 2D LVIDd:         3.60 cm   Diastology LVIDs:         2.30 cm   LV e' medial:    6.74 cm/s LV PW:         1.10 cm   LV E/e' medial:  11.9 LV IVS:        1.10 cm   LV e' lateral:   9.03 cm/s LVOT diam:     1.90 cm   LV E/e' lateral: 8.9 LV SV:         54 LV SV Index:   26 LVOT Area:     2.84 cm  RIGHT VENTRICLE RV S prime:     27.20 cm/s TAPSE (M-mode): 2.4 cm LEFT ATRIUM             Index        RIGHT ATRIUM           Index LA diam:        4.00 cm 1.96 cm/m   RA Area:     15.30 cm LA Vol (A2C):   45.9 ml 22.49 ml/m  RA Volume:   37.00 ml  18.13 ml/m LA Vol (A4C):   41.7 ml 20.43 ml/m LA Biplane Vol: 44.4 ml 21.75 ml/m  AORTIC VALVE AV Area (Vmax): 2.09 cm AV Vmax:        163.00 cm/s AV Peak Grad:   10.6 mmHg LVOT Vmax:      120.00 cm/s LVOT Vmean:     77.000 cm/s LVOT VTI:       0.189 m  AORTA Ao Root diam: 2.80 cm Ao Asc diam:  3.30 cm MITRAL VALVE MV Area (PHT): 4.68 cm    SHUNTS MV Decel Time: 162 msec    Systemic VTI:  0.19 m MV E velocity: 80.20 cm/s  Systemic Diam: 1.90 cm MV A velocity: 93.80 cm/s MV E/A ratio:  0.86 Sunit Tolia Electronically signed by Madonna Large Signature Date/Time: 05/18/2024/6:26:28 PM    Final     ASSESSMENT/PLAN:  Assessment: Principal Problem:   Unstable angina (HCC) Active Problems:   Essential hypertension   History of CVA  (cerebrovascular accident)   Chronic obstructive pulmonary disease (HCC)   MDD (major depressive disorder), recurrent, severe, with psychosis (HCC)   Cigarette smoker   Type 2 diabetes mellitus with complication, with long-term current use of insulin  (HCC)   Obesity hypoventilation syndrome (HCC)   (HFpEF) heart failure with preserved ejection fraction (HCC)  Fallan Evet Shvartsman is a 53 y.o. female with a pertinent PMH of HTN, HLD, HFpEF, COPD, prior CVA, PAD, DMII, who presented with chest pain and admitted for unstable angina and NSTEMI ruleout.    Plan:  #Type 2 diabetes mellitus with complication, with long-term current use of insulin  #Gastroparesis #Chest pain radiating to back #Epigastric pain. #GERD She has had regurgitation with belching that has been worsening. Her symptoms worsened after switching from Victoza  to Mounjaro  1 month ago. CTA showed distended stomach with fluid. Physical exam showed distended abdomen and LUQ tympany. She has not had a large appetite, has had  and has not been regularly passing gas. Her history, imaging, and physical exam are consistent with gastroparesis, likely stemming from her GLP-1 use, but could also be a progression of her diabetes. Seen by cardiology who were unconcerned for ACS and have since signed off. It is likely that her chest pain may be stemming from her reflux symptoms in the setting of her gastroparesis, and not from a cardiac cause.   Plan: -Hold home Mounjaro  in the setting of possible Gastroparesis. -Continue home Jardiance  10 mg daily -Start Reglan  5mg  before meals and bedtime -Sucralfate  1g with meals and before bedtime. -15 units glargine -Sliding scale insulin  sensitive scale -CBG -Miralax Tricia bowel regimen -Maalox q6 PRN  #NSTEMI rule out #Troponemia Has had recurrent chest pain prompting a separate visit to the ED two days prior to her admission. CTA was negative for a PE. Chest x-ray showed potential  cardiomegaly. EKG with sinus tachycardia. Differential includes ACS, COPD exacerbation, pericarditis, gastroparesis, and heart failure exacerbation. Heart score 5 on admission. Troponins elevated and rose to peak at 42 at 1425 on 7/14 BNP WNL. Was not started on heparin in the ED given only mild troponin elevation. Cardiology consulted and were not concerned for ACS and started empiric treatment for pericarditis, now discontinued due to only slightly elevated ESR and CRP. She has some anterior chest wall tenderness.   Plan: -Echocardiogram ordered. -Cardiology signed off -Nitroglycerin  0.4mg  PRN -Stop colchicine  0.6mg  BID -Stop Ibuprofen  600mg  TID -Stop Telemetry -Lidocaine  patch.   #HFpEF Last echocardiogram on 04/15/23 showed EF of 60-65% with grade 1 diastolic dysfunction. New echo on 7/14 showed left ventricular EF of >75% with hyperdynamic function and no diastolic dysfunction.    Plan: -Echocardiogram ordered -Home lasix  40mg  daily -Jardiance  10mg  daily -Start spironolactone prior to discharge per cardiology.   #Essential Hypertension  Came in with chest pain and blurry vision, BP elevated to 197/117 in the setting of missed medications. Home medications Olmesartan . BP today 128/78 -Irbesartan  150mg  while inpatient   #Hx of CVA -Aspirin  81mg  -Atorvastatin  40mg    #Depression -Home Cymbalta  30mg    #COPD #Current smoker Continue home albuterol , singular, dulera , spiriva     #AKI  Mild AKI in the ED on 7/14. Cr elevated to 1.21 over baseline of 0.89. Now 1.20 -Monitor with daily BMP   #Obesity hypoventilation syndrome -Home CPAP   #GERD -Protonix  20mg  daily  Best Practice: Diet: Cardiac diet VTE: enoxaparin  (LOVENOX ) injection 40 mg Start: 05/18/24 1000 Code: Full  Disposition planning: DISPO: Anticipated discharge tomorrow to Home pending improvement of her GI symptoms.  Signature:  Melvenia Napoleon [facility]  1:53 PM, 05/19/2024   On Call pager [phone]

## 2024-05-19 NOTE — ED Notes (Signed)
 Patient states that her IV was pulled out when she turned over in the bed. RN from dayshift states that the patient continues to remove the IV every time one is placed. Currently does not have an IV.

## 2024-05-20 DIAGNOSIS — Z6841 Body Mass Index (BMI) 40.0 and over, adult: Secondary | ICD-10-CM

## 2024-05-20 DIAGNOSIS — K3184 Gastroparesis: Secondary | ICD-10-CM | POA: Diagnosis not present

## 2024-05-20 DIAGNOSIS — R1013 Epigastric pain: Secondary | ICD-10-CM | POA: Diagnosis not present

## 2024-05-20 DIAGNOSIS — R079 Chest pain, unspecified: Secondary | ICD-10-CM | POA: Diagnosis not present

## 2024-05-20 DIAGNOSIS — E1143 Type 2 diabetes mellitus with diabetic autonomic (poly)neuropathy: Secondary | ICD-10-CM | POA: Diagnosis not present

## 2024-05-20 LAB — BASIC METABOLIC PANEL WITH GFR
Anion gap: 12 (ref 5–15)
BUN: 17 mg/dL (ref 6–20)
CO2: 26 mmol/L (ref 22–32)
Calcium: 9.2 mg/dL (ref 8.9–10.3)
Chloride: 99 mmol/L (ref 98–111)
Creatinine, Ser: 0.95 mg/dL (ref 0.44–1.00)
GFR, Estimated: >60 mL/min (ref >60)
Glucose, Bld: 151 mg/dL — ABNORMAL HIGH (ref 70–99)
Potassium: 4.3 mmol/L (ref 3.5–5.1)
Sodium: 137 mmol/L (ref 135–145)

## 2024-05-20 LAB — GLUCOSE, CAPILLARY
Glucose-Capillary: 129 mg/dL — ABNORMAL HIGH (ref 70–99)
Glucose-Capillary: 163 mg/dL — ABNORMAL HIGH (ref 70–99)
Glucose-Capillary: 159 mg/dL — ABNORMAL HIGH (ref 70–99)
Glucose-Capillary: 159 mg/dL — ABNORMAL HIGH (ref 70–99)

## 2024-05-20 MED ORDER — PANTOPRAZOLE SODIUM 40 MG PO TBEC
40.0000 mg | DELAYED_RELEASE_TABLET | Freq: Two times a day (BID) | ORAL | Status: DC
  Administered 2024-05-20 – 2024-05-21 (×3): 40 mg via ORAL
  Filled 2024-05-20 (×3): qty 1

## 2024-05-20 MED ORDER — AMLODIPINE BESYLATE 5 MG PO TABS
5.0000 mg | ORAL_TABLET | Freq: Every day | ORAL | Status: DC
  Administered 2024-05-20 – 2024-05-21 (×2): 5 mg via ORAL
  Filled 2024-05-20 (×2): qty 1

## 2024-05-20 NOTE — Progress Notes (Cosign Needed)
 Without the use of the ventilator the patient's condition will quickly deteriorate. Removal of the ventilator may cause serious harm to the patient, exacerbation of condition and hospital readmission. Bilevel/RAD has been tried and failed to maintain or stabilize the patient. Bilevel does not meet current volume requirements. Patient requires frequent durations of ventilatory support. Intermittent usage is insufficient.

## 2024-05-20 NOTE — TOC Progression Note (Addendum)
 Transition of Care [facility]) - Progression Note    Patient Details  Name: Cathy Hall MRN: [id number] Date of Birth: 1971/07/17  Transition of Care [facility]) CM/SW Contact  Roxie KANDICE Stain, RN Phone Number: 05/20/2024, 11:52 AM  Clinical Narrative:     Spoke to Mtich with [employer] regarding rollator and bipap. Patient states both are broken.  Mitch doesn't show any record of patient receiving bipap from [facility] even though this was charged by [facility] team in 04/2024. [name] is going to request management approval to replace the rollator. NIV paper order on patient's hard chart for MD to sign.   TOC following.   Expected Discharge Plan: Home/Self Care Barriers to Discharge: Continued Medical Work up  Expected Discharge Plan and Services   Discharge Planning Services: CM Consult   Living arrangements for the past 2 months: Single Family Home                                       Social Determinants of Health (SDOH) Interventions SDOH Screenings   Food Insecurity: Food Insecurity Present (05/19/2024)  Housing: High Risk (05/19/2024)  Transportation Needs: Unmet Transportation Needs (05/19/2024)  Utilities: Not At Risk (05/19/2024)  Alcohol  Screen: Low Risk  (11/21/2021)  Depression (PHQ2-9): Low Risk  (03/26/2024)  Financial Resource Strain: Low Risk  (11/05/2018)   Received from [facility]  Physical Activity: Insufficiently Active (11/05/2018)   Received from [facility]  Social Connections: Unknown (03/04/2023)   Received from [facility]  Stress: No Stress Concern Present (11/05/2018)   Received from [facility]  Tobacco Use: High Risk (05/18/2024)    Readmission Risk Interventions    04/21/2023   11:39 AM  Readmission Risk Prevention Plan  Transportation Screening Complete  Medication Review (RN Care Manager) Complete  HRI or Home Care Consult Complete  SW Recovery Care/Counseling Consult Complete  Palliative  Care Screening Not Applicable

## 2024-05-20 NOTE — Progress Notes (Addendum)
 HD#0 SUBJECTIVE:  Patient Summary: Cathy Hall is a 53 y.o. female with a pertinent PMH of HTN, HLD, HFpEF, COPD, prior CVA, PAD, DMII, who presented with chest pain and admitted for unstable angina and NSTEMI ruleout, now with gastroparesis and GERD symptoms.   Overnight Events: She had chest pain last night. EKG with sinus tachycardia. Treated with nitroglycerin  and Maalox with improvement of her symptoms.  Interim History: Patient reports that every time she goes to lay down her pain starts. Patient reports whenever patient sits up the pain will go away. She could sleep when she was sitting up. Patient reports some pain her stomach on the left side. Patient reports she's had one bowel movement, but stomach still hurts. Patient reports that she is unsure if it hurts when she swallows her food. Patient said she would like to try continuing to eat today.   OBJECTIVE:  Vital Signs: Vitals:   05/20/24 0300 05/20/24 0747 05/20/24 1132 05/20/24 1136  BP: 139/88 127/84 (!) 157/93   Pulse: (!) 102   89  Resp: 17 19 20 19   Temp: 97.6 F (36.4 C) 97.8 F (36.6 C) 97.8 F (36.6 C)   TempSrc: Axillary Oral Oral   SpO2:  99% 96%   Weight:      Height:       Supplemental O2: Nasal Cannula SpO2: 99 % O2 Flow Rate (L/min): 2 L/min  Filed Weights   05/19/24 1046  Weight: 130.2 kg     Intake/Output Summary (Last 24 hours) at 05/20/2024 1555 Last data filed at 05/20/2024 0748 Gross per 24 hour  Intake 720 ml  Output --  Net 720 ml   Net IO Since Admission: 720 mL [05/20/24 1555]  Physical Exam: Physical Exam Constitutional:      General: She is not in acute distress.    Appearance: She is not ill-appearing.  Chest:     Chest wall: Tenderness present.  Abdominal:     General: There is distension.     Tenderness: There is abdominal tenderness in the left upper quadrant.     Comments: Tenderness improved from yesterday  Neurological:     Mental Status: She is  alert.      Patient Lines/Drains/Airways Status     Active Line/Drains/Airways     Name Placement date Placement time Site Days   Peripheral IV 05/19/24 18 G 2.5 Anterior;Left Forearm 05/19/24  2024  Forearm  1            Pertinent labs and imaging:      Latest Ref Rng & Units 05/18/2024    1:30 AM 05/17/2024    6:35 AM 01/15/2024    1:47 PM  CBC  WBC 4.0 - 10.5 K/uL 10.1  10.5  8.9   Hemoglobin 12.0 - 15.0 g/dL 86.3  85.1  85.2   Hematocrit 36.0 - 46.0 % 43.0  44.4  44.9   Platelets 150 - 400 K/uL 203  207  223        Latest Ref Rng & Units 05/20/2024    2:43 AM 05/19/2024    4:50 AM 05/18/2024    6:16 AM  CMP  Glucose 70 - 99 mg/dL 848  817  764   BUN 6 - 20 mg/dL 17  18  15    Creatinine 0.44 - 1.00 mg/dL 9.04  8.79  8.96   Sodium 135 - 145 mmol/L 137  136  136   Potassium 3.5 - 5.1 mmol/L 4.3  4.6  4.5   Chloride 98 - 111 mmol/L 99  97  100   CO2 22 - 32 mmol/L 26  26  25    Calcium  8.9 - 10.3 mg/dL 9.2  9.8  9.9   Total Protein 6.5 - 8.1 g/dL   6.6   Total Bilirubin 0.0 - 1.2 mg/dL   0.6   Alkaline Phos 38 - 126 U/L   77   AST 15 - 41 U/L   26   ALT 0 - 44 U/L   17     No results found.  EKG obtained 7/15 showed sinus tachycardia with no ST elevations by my interpretation.  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 female with a pertinent PMH of HTN, HLD, HFpEF, COPD, prior CVA, PAD, DMII, who presented with chest pain and admitted for unstable angina and NSTEMI ruleout, now  with gastroparesis and GERD symptoms.  #Type 2 diabetes mellitus with complication, with long-term current use of insulin  #Gastroparesis #Chest pain radiating to back #Epigastric pain. #GERD She has had regurgitation with belching that has been worsening. Her symptoms worsened after switching from Victoza  to Mounjaro  1 month ago. CTA showed distended stomach with fluid. Physical exam showed distended abdomen and LUQ tympany. Her history, imaging, and physical exam are consistent with gastroparesis, likely stemming from her GLP-1 use, but could also be a progression of her diabetes. Seen by cardiology who were unconcerned for ACS and have since signed off. It is likely that her chest pain may be stemming from her reflux symptoms in the setting of her gastroparesis, and not from a cardiac cause.   Plan: -Hold home Mounjaro  in the setting of possible Gastroparesis. -Continue home Jardiance  10 mg daily -Started Reglan  5mg  before meals and bedtime -Sucralfate  1g with meals and before bedtime. -15 units glargine -Sliding scale insulin  sensitive scale -CBG -Miralax Tricia bowel regimen -Maalox q6 PRN -Increase Protonix  to 40mg  BID   #NSTEMI rule out #Troponemia Has had recurrent chest pain prompting a separate visit to the ED two days prior to her admission. CTA was negative for a PE. Chest x-ray showed potential cardiomegaly. EKG with sinus tachycardia. Differential includes ACS, COPD exacerbation, pericarditis, gastroparesis, and heart failure exacerbation. Heart score 5 on admission. Troponins elevated and rose to peak at 42 at 1425 on 7/14 BNP WNL. Was not started on heparin in the ED given only mild troponin elevation. Cardiology consulted and were not concerned for ACS and started empiric treatment for pericarditis, now discontinued due to only slightly elevated ESR and CRP. She has some anterior chest wall tenderness.   Plan: -Lidocaine  patch. -Voltaren  gel   #HFpEF Last echocardiogram  on 04/15/23 showed EF of 60-65% with grade 1 diastolic dysfunction. New echo on 7/14 showed left ventricular EF of >75% with hyperdynamic function and no diastolic dysfunction.    Plan: -Home lasix  40mg  daily -Jardiance  10mg  daily -Start spironolactone prior to discharge per cardiology.   #Essential Hypertension  Came in with chest pain and blurry vision, BP elevated to 197/117 in the setting  of missed medications. Home medications Olmesartan . BP today 139/88 -Irbesartan  150mg  while inpatient   #Hx of CVA -Aspirin  81mg  -Atorvastatin  40mg    #Depression -Home Cymbalta  30mg    #COPD #Current smoker Continue home albuterol , singular, dulera , spiriva     #AKI  Mild AKI in the ED on 7/14. Cr elevated to 1.21 over baseline of 0.89. Now 1.20 -Monitor with daily BMP   #Obesity hypoventilation syndrome -Home CPAP  Best Practice: Diet: Cardiac diet VTE: enoxaparin  (LOVENOX ) injection 40 mg Start: 05/18/24 1000 Code: Full  Disposition planning: DISPO: Anticipated discharge tomorrow to Home pending improvement of her symptoms.  Signature:  Mliss Arlean Trudy [facility]  3:55 PM, 05/20/2024  On Call pager [phone]

## 2024-05-20 NOTE — Progress Notes (Signed)
@   2332 pt complained of 9/10 CP, pt stated that SL Nitroglycerin  had previously alleviated symptoms. 12-lead EKG obtained, 2x SL Nitroglycerin  administered, attempted to notify Tobie, DO. New orders placed for possible GI symptoms.  @ 0000 Pt is now resting comfortably, symptoms alleviated. Tobie, DO made aware. BP 126/87 MAP 99.  Lonell LITTIE Lyme, RN

## 2024-05-20 NOTE — Plan of Care (Signed)

## 2024-05-21 ENCOUNTER — Telehealth: Payer: Self-pay | Admitting: *Deleted

## 2024-05-21 ENCOUNTER — Other Ambulatory Visit: Payer: Self-pay

## 2024-05-21 ENCOUNTER — Other Ambulatory Visit (HOSPITAL_COMMUNITY): Payer: Self-pay

## 2024-05-21 ENCOUNTER — Other Ambulatory Visit: Payer: Self-pay | Admitting: Student

## 2024-05-21 DIAGNOSIS — F1910 Other psychoactive substance abuse, uncomplicated: Secondary | ICD-10-CM

## 2024-05-21 DIAGNOSIS — F333 Major depressive disorder, recurrent, severe with psychotic symptoms: Secondary | ICD-10-CM

## 2024-05-21 DIAGNOSIS — K219 Gastro-esophageal reflux disease without esophagitis: Secondary | ICD-10-CM | POA: Diagnosis not present

## 2024-05-21 DIAGNOSIS — K5900 Constipation, unspecified: Secondary | ICD-10-CM | POA: Diagnosis not present

## 2024-05-21 DIAGNOSIS — F4321 Adjustment disorder with depressed mood: Secondary | ICD-10-CM

## 2024-05-21 LAB — BASIC METABOLIC PANEL WITH GFR
Anion gap: 11 (ref 5–15)
BUN: 19 mg/dL (ref 6–20)
CO2: 24 mmol/L (ref 22–32)
Calcium: 9 mg/dL (ref 8.9–10.3)
Chloride: 101 mmol/L (ref 98–111)
Creatinine, Ser: 1.16 mg/dL — ABNORMAL HIGH (ref 0.44–1.00)
GFR, Estimated: 57 mL/min — ABNORMAL LOW
Glucose, Bld: 200 mg/dL — ABNORMAL HIGH (ref 70–99)
Potassium: 3.9 mmol/L (ref 3.5–5.1)
Sodium: 136 mmol/L (ref 135–145)

## 2024-05-21 LAB — GLUCOSE, CAPILLARY
Glucose-Capillary: 160 mg/dL — ABNORMAL HIGH (ref 70–99)
Glucose-Capillary: 145 mg/dL — ABNORMAL HIGH (ref 70–99)
Glucose-Capillary: 206 mg/dL — ABNORMAL HIGH (ref 70–99)

## 2024-05-21 MED ORDER — CALCIUM CARBONATE ANTACID 500 MG PO CHEW
1.0000 | CHEWABLE_TABLET | Freq: Once | ORAL | Status: AC
  Administered 2024-05-21: 200 mg via ORAL
  Filled 2024-05-21: qty 1

## 2024-05-21 MED ORDER — PANTOPRAZOLE SODIUM 40 MG PO TBEC
40.0000 mg | DELAYED_RELEASE_TABLET | Freq: Two times a day (BID) | ORAL | 0 refills | Status: DC
  Filled 2024-05-21 – 2024-05-29 (×3): qty 60, 30d supply, fill #0

## 2024-05-21 MED ORDER — HYOSCYAMINE SULFATE 0.125 MG SL SUBL
0.2500 mg | SUBLINGUAL_TABLET | Freq: Once | SUBLINGUAL | Status: AC
  Administered 2024-05-21: 0.25 mg via SUBLINGUAL
  Filled 2024-05-21: qty 2

## 2024-05-21 MED ORDER — AMLODIPINE BESYLATE 5 MG PO TABS
5.0000 mg | ORAL_TABLET | Freq: Every day | ORAL | 0 refills | Status: DC
  Filled 2024-05-21: qty 30, 30d supply, fill #0

## 2024-05-21 MED ORDER — SENNOSIDES-DOCUSATE SODIUM 8.6-50 MG PO TABS
1.0000 | ORAL_TABLET | Freq: Two times a day (BID) | ORAL | 0 refills | Status: AC
  Filled 2024-05-21: qty 60, 30d supply, fill #0

## 2024-05-21 MED ORDER — NITROGLYCERIN 0.4 MG SL SUBL: 0.4000 mg | SUBLINGUAL_TABLET | SUBLINGUAL | Status: DC | PRN

## 2024-05-21 MED ORDER — POLYETHYLENE GLYCOL 3350 17 G PO PACK
17.0000 g | PACK | Freq: Every day | ORAL | 0 refills | Status: AC
  Filled 2024-05-21: qty 14, 14d supply, fill #0

## 2024-05-21 MED ORDER — METOCLOPRAMIDE HCL 5 MG PO TABS
5.0000 mg | ORAL_TABLET | Freq: Three times a day (TID) | ORAL | 0 refills | Status: DC
  Filled 2024-05-21: qty 120, 30d supply, fill #0

## 2024-05-21 NOTE — Discharge Instructions (Addendum)
 You were hospitalized for reflux. Thank you for allowing us  to be part of your care.   We arranged for you to follow up with your primary care doctor Cathy Hall on 05/25/2024 at 3:10 PM    Please note these changes made to your medications:   *Please START taking:  Protonix  40mg  twice a day Reglan  5mg  30 minutes before meals three times a day and once before bed Amlodipine  5mg  once a day Miralax  once daily Senna once daily  *Please STOP taking:  Tramadol  Mounjaro   Please make sure to follow up with your pulmonologist on 7/28 at 4:00PM  Please call our clinic if you have any questions or concerns, we may be able to help and keep you from a long and expensive emergency room wait. Our clinic and after hours phone number is [phone], the best time to call is Monday through Friday 9 am to 4 pm but there is always someone available 24/7 if you have an emergency. If you need medication refills please notify your pharmacy one week in advance and they will send us  a request.

## 2024-05-21 NOTE — Discharge Summary (Addendum)
 Name: Cathy Hall MRN: [id number] DOB: Sep 01, 1971 53 y.o. PCP: Cathy Homans, MD  Date of Admission: 05/18/2024 12:07 AM Date of Discharge: 05/21/2024 Attending Physician: Dr. Mliss Hall  Discharge Diagnosis: 1. 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 Generic drug: tirzepatide  Inject 2.5 mg into the skin once a week.       STOP taking these medications    traMADol  50 MG tablet Commonly known as: ULTRAM        TAKE these medications    acetaminophen  325 MG tablet Commonly known as: TYLENOL  Take 650 mg by mouth 2 (two) times daily as needed for fever, headache or moderate pain.   albuterol  108 (90 Base) MCG/ACT inhaler Commonly known as: VENTOLIN  HFA Inhale 2 puffs into the lungs every 4 (four) hours as needed for wheezing or shortness of breath.   amLODipine  5 MG tablet Commonly known as: NORVASC  Take 1 tablet (5 mg total) by mouth daily. Start taking on: May 22, 2024   Aspirin  Low Dose 81 MG tablet Generic drug: aspirin  EC Take 1 tablet (81 mg total) by mouth daily. Swallow whole.   atorvastatin  40 MG tablet Commonly known as: LIPITOR Take 1 tablet (40 mg total) by mouth daily.   conjugated estrogens  vaginal cream Commonly known as:  PREMARIN  Apply one applicator INTRAVAGINALLY twice a week   cyclobenzaprine  10 MG tablet Commonly known as: FLEXERIL  Take 1 tablet (10 mg total) by mouth 3 (three) times daily as needed for muscle spasms.   diclofenac  Sodium 1 % Gel Commonly known as: VOLTAREN  Apply 1 Application topically 4 (four) times daily as needed (pain).   DULoxetine  30 MG capsule Commonly known as: Cymbalta  Take 1 capsule (30 mg total) by mouth daily.   empagliflozin  10 MG Tabs tablet Commonly known as: JARDIANCE  Take 1 tablet (10 mg total) by mouth daily before breakfast.   fluticasone  50 MCG/ACT nasal spray Commonly known as: FLONASE  PLACE 2 SPRAYS INTO BOTH NOSTRILS DAILY. What changed:  when to take this reasons to take this   furosemide  40 MG tablet Commonly known as: Lasix  Take 1 tablet (40 mg total) by mouth daily.   hydrOXYzine  50 MG tablet Commonly known as: ATARAX  Take 1 tablet (50 mg total) by mouth every 6 (six) hours as needed for anxiety.   IRON PO Take 1 tablet by mouth daily.   loratadine  10 MG dissolvable tablet Commonly known as: CLARITIN  REDITABS Place 1 tablet (10 mg total) on the tongue and allow to dissolve daily.   melatonin 3 MG Tabs tablet Take 2 tablets (6 mg total) by mouth at bedtime.   metoCLOPramide  5 MG tablet Commonly known as: REGLAN  Take 1 tablet (5 mg total) by mouth 4 (  four) times daily -  before meals and at bedtime.   mometasone -formoterol  100-5 MCG/ACT Aero Commonly known as: DULERA  Inhale 2 puffs into the lungs 2 (two) times daily.   montelukast  10 MG tablet Commonly known as: SINGULAIR  Take 1 tablet (10 mg total) by mouth at bedtime.   multivitamin with minerals Tabs tablet Take 1 tablet by mouth daily.   nicotine  14 mg/24hr patch Commonly known as: NICODERM CQ  - dosed in mg/24 hours Place 1 patch (14 mg total) onto the skin daily.   olmesartan  20 MG tablet Commonly known as: BENICAR  Take 1 tablet (20 mg total) by mouth daily.    pantoprazole  40 MG tablet Commonly known as: PROTONIX  Take 1 tablet (40 mg total) by mouth 2 (two) times daily. What changed:  medication strength how much to take when to take this   polyethylene glycol 17 g packet Commonly known as: MIRALAX  / GLYCOLAX  Take 17 g by mouth daily. Start taking on: May 22, 2024   pregabalin  50 MG capsule Commonly known as: Lyrica  Take 1 capsule (50 mg total) by mouth 3 (three) times daily.   REFRESH OP Place 1 drop into both eyes 2 (two) times daily as needed (dryness).   senna-docusate 8.6-50 MG tablet Commonly known as: Senokot-S Take 1 tablet by mouth 2 (two) times daily.   tiotropium 18 MCG inhalation capsule Commonly known as: SPIRIVA  Place 1 capsule (18 mcg total) into handihaler and inhale daily. *do not swallow capsule*   Tresiba  FlexTouch 100 UNIT/ML FlexTouch Pen Generic drug: insulin  degludec Inject 20 Units into the skin daily.               Durable Medical Equipment  (From admission, onward)           Start     Ordered   05/20/24 1149  For home use only DME 4 wheeled rolling walker with seat  Once       Question:  Patient needs a walker to treat with the following condition  Answer:  Physical deconditioning   05/20/24 1148            Disposition and follow-up:   [name] was discharged from [facility] in Good condition.  At the hospital follow up visit please address:  #GERD with chest pain secondary to gastroparesis possibly stemming from GLP-1 use. She was experiencing severe chest pain radiating to back and left shoulder secondary to GERD during her hospitalization. She had dilated stomach contents on CT and had not used the restroom for several days. She felt that her burps contained food from 2 days ago. She was discharged on a regimen of Protonix  40mg  BID, Amlodipine , Reglan  5mg , Miralax  and Senna. Please assess symptom control. Consider gastric emptying study.  Mounjaro  held for now in the setting of gastroparesis, wait to restart until symptoms resolve or consider other agent. Refer to dietician for healthy lifestyle changes. Referral placed for counseling with Cathy Hall at the internal medicine clinic.  Reglan  may not be needed long term, and will have to be stopped prior to a gastric emptying study if pursued.  #COPD with chronic respiratory failure During her stay, she required oxygen with BiPAP overnight to maintain saturations. She has previously used a CPAP at home but this has become unusable to her due to the hosing. She will be started on NIV on discharge and follow-up with pulmonology on 7/28 at 4:00PM.  #Hypertension Currently on olmesartan . Started on Amlodipine  inpatient for smooth muscle relaxation.  Please check BMP.  2.  Labs / imaging needed at time of follow-up: BMP, gastric emptying study, referral to dietician and counseling.  Follow-up Appointments:  Follow-up Information     Cathy Homans, MD Follow up on 05/25/2024.   Specialty: Internal Medicine Why: At 3:05 PM Contact information: [address] [place] [address] [phone]         Mannam, Praveen, MD Follow up on 06/01/2024.   Specialty: Pulmonary Disease Why: At 4:00PM Contact information: [address] [place] [address] [phone]                  Hospital Course by problem list: Braiden Presutti is a 53 y.o. female with a pertinent PMH of HTN, HLD, HFpEF, COPD, prior CVA, PAD, DMII, who presented with chest pain due to gastroparesis and GERD symptoms, now being discharged with the following pertinent hospital course:   #GERD with chest pain secondary to gastroparesis possibly stemming from GLP-1 use. She presented with chest pain and mildly elevated troponins for 3 days prior to admission. Her pain was worse with lying down and resolved when she sat up. She has had regurgitation with belching that had  been worsening.  Her symptoms worsened after switching from Victoza  to Mounjaro  1 month ago. She underwent NSTEMI rule out. Troponins peaked at 42. EKG showed sinus tachycardia, CTA was negative for PE. Cardiology were consulted and were not concerned for ACS.  CTA showed distended stomach with fluid. Physical exam showed distended abdomen and LUQ tympany. Her history, imaging, and physical exam are consistent with gastroparesis, likely stemming from her GLP-1 use, but could also be a progression of her diabetes. Her chest pain was thought to be stemming from her reflux symptoms in the setting of her gastroparesis, and not from a cardiac cause. She has continued pain during the night while lying flat and has received hyoscyamine  tablets with no significant benefit. During her admission, she received Carafate , Maalox, and Tums. She was started on Protonix  40mg  BID, Amlodipine , Reglan  5mg , Miralax  and Senna for discharge. GI was consulted and they agreed to continue Reglan , Protonix , and Miralax .  #Costochondritis Has had recurrent chest pain prompting a separate visit to the ED two days prior to her admission. CTA was negative for a PE. Chest x-ray showed potential cardiomegaly. EKG with sinus tachycardia. Heart score 5 on admission. Troponins elevated and rose to peak at 42 at 1425 on 7/14 BNP WNL. Was not started on heparin in the ED given only mild troponin elevation. Cardiology consulted and were not concerned for ACS and started empiric treatment for pericarditis, now discontinued due to only slightly elevated ESR and CRP. She has some anterior chest wall tenderness, possibly due to costochondritis, which was treated with a Lidocaine  patch and Voltaren  gel.  Oral NSAID avoided so as not to exacerbate any esophagitis.    Stable Chronic problems   #HFpEF Last echocardiogram on 04/15/23 showed EF of 60-65% with grade 1 diastolic dysfunction. New echo on 7/14 showed left ventricular EF of >75% with  hyperdynamic function and no diastolic dysfunction. She was continued on Home lasix  40mg  and Jardiance .   #Essential Hypertension  Came in with chest pain and blurry vision, BP elevated to 197/117 in the setting of missed medications, pain, and anxiety. Home medications Olmesartan . BP today 134/76. Started on Amlodipine  5mg  for esophageal spasm but may assist with hypertension.   #Hx of CVA -Aspirin  81mg  -Atorvastatin  40mg    #Depression -Home  Cymbalta  30mg    #COPD with chronic respiratory failure #Current smoker During her stay, she required oxygen with BiPAP overnight to maintain saturations. Continue home albuterol , singular, dulera , spiriva . She has previously used a CPAP at home but this has become unusable to her due to the hosing.    Discharge Subjective Cobi Evet Dukes-Enaiho continued to have chest pain overnight after eating a honey bun and drinking carbonated beverages. She states that she would like to go home and see her dog.  Discharge Exam:   BP 123/63 (BP Location: Right Wrist)   Pulse 100   Temp 98.2 F (36.8 C) (Oral)   Resp 16   Ht 5' 1 (1.549 m)   Wt 130.2 kg   SpO2 93%   BMI 54.23 kg/m  Discharge exam:  Physical Exam Constitutional:      General: She is not in acute distress.    Appearance: She is obese. She is not ill-appearing.  Neurological:     Mental Status: She is alert.      Pertinent Labs, Studies, and Procedures:     Latest Ref Rng & Units 05/18/2024    1:30 AM 05/17/2024    6:35 AM 01/15/2024    1:47 PM  CBC  WBC 4.0 - 10.5 K/uL 10.1  10.5  8.9   Hemoglobin 12.0 - 15.0 g/dL 86.3  85.1  85.2   Hematocrit 36.0 - 46.0 % 43.0  44.4  44.9   Platelets 150 - 400 K/uL 203  207  223        Latest Ref Rng & Units 05/21/2024    3:19 AM 05/20/2024    2:43 AM 05/19/2024    4:50 AM  CMP  Glucose 70 - 99 mg/dL 799  848  817   BUN 6 - 20 mg/dL 19  17  18    Creatinine 0.44 - 1.00 mg/dL 8.83  9.04  8.79   Sodium 135 - 145 mmol/L 136  137  136    Potassium 3.5 - 5.1 mmol/L 3.9  4.3  4.6   Chloride 98 - 111 mmol/L 101  99  97   CO2 22 - 32 mmol/L 24  26  26    Calcium  8.9 - 10.3 mg/dL 9.0  9.2  9.8     ECHOCARDIOGRAM COMPLETE Result Date: 05/18/2024    ECHOCARDIOGRAM REPORT   Patient Name:   Reya Aurich [facility] Date of Exam: 05/18/2024 Medical Rec #:  [id number]                   Height:       61.0 in Accession #:    [id number]                  Weight:       240.0 lb Date of Birth:  [birth year]                   BSA:          2.041 m Patient Age:    52 years                    BP:           134/86 mmHg Patient Gender: F                           HR:           100 bpm. Exam Location:  Inpatient Procedure: 2D  Echo, Cardiac Doppler and Color Doppler (Both Spectral and Color            Flow Doppler were utilized during procedure). Indications:    Chest Pain R07.9  History:        Patient has prior history of Echocardiogram examinations, most                 recent 04/15/2023. CHF, Angina, PAD and Stroke; Risk                 Factors:Hypertension, Diabetes and Current Smoker.  Sonographer:    Thea Norlander RCS Referring Phys: LIBBY BLANCH  Sonographer Comments: Technically difficult study due to poor echo windows. IMPRESSIONS  1. Left ventricular ejection fraction, by estimation, is >75%. The left ventricle has hyperdynamic function. The left ventricle has no regional wall motion abnormalities. Left ventricular diastolic parameters were normal.  2. Right ventricular systolic function is normal. The right ventricular size is normal.  3. The mitral valve is grossly normal. No evidence of mitral valve regurgitation. No evidence of mitral stenosis.  4. The aortic valve is normal in structure. Aortic valve regurgitation is not visualized. No aortic stenosis is present. Comparison(s): A prior study was performed on 04/15/2023. Reported: LVEF 60-65%, Grade I diastolic dysfunction, see report for additional details. FINDINGS  Left Ventricle: Left ventricular  ejection fraction, by estimation, is >75%. The left ventricle has hyperdynamic function. The left ventricle has no regional wall motion abnormalities. The left ventricular internal cavity size was normal in size. There is borderline left ventricular hypertrophy. Left ventricular diastolic parameters were normal. Right Ventricle: The right ventricular size is normal. No increase in right ventricular wall thickness. Right ventricular systolic function is normal. Left Atrium: Left atrial size was normal in size. Right Atrium: Right atrial size was normal in size. Pericardium: There is no evidence of pericardial effusion. Mitral Valve: The mitral valve is grossly normal. Mild mitral annular calcification. No evidence of mitral valve regurgitation. No evidence of mitral valve stenosis. Tricuspid Valve: The tricuspid valve is normal in structure. Tricuspid valve regurgitation is trivial. No evidence of tricuspid stenosis. Aortic Valve: The aortic valve is normal in structure. Aortic valve regurgitation is not visualized. No aortic stenosis is present. Aortic valve peak gradient measures 10.6 mmHg. Pulmonic Valve: The pulmonic valve was normal in structure. Pulmonic valve regurgitation is trivial. No evidence of pulmonic stenosis. Aorta: The aortic root and ascending aorta are structurally normal, with no evidence of dilitation. Venous: The inferior vena cava was not well visualized. IAS/Shunts: The interatrial septum was not well visualized.  LEFT VENTRICLE PLAX 2D LVIDd:         3.60 cm   Diastology LVIDs:         2.30 cm   LV e' medial:    6.74 cm/s LV PW:         1.10 cm   LV E/e' medial:  11.9 LV IVS:        1.10 cm   LV e' lateral:   9.03 cm/s LVOT diam:     1.90 cm   LV E/e' lateral: 8.9 LV SV:         54 LV SV Index:   26 LVOT Area:     2.84 cm  RIGHT VENTRICLE RV S prime:     27.20 cm/s TAPSE (M-mode): 2.4 cm LEFT ATRIUM             Index        RIGHT ATRIUM  Index LA diam:        4.00 cm 1.96 cm/m   RA  Area:     15.30 cm LA Vol (A2C):   45.9 ml 22.49 ml/m  RA Volume:   37.00 ml  18.13 ml/m LA Vol (A4C):   41.7 ml 20.43 ml/m LA Biplane Vol: 44.4 ml 21.75 ml/m  AORTIC VALVE AV Area (Vmax): 2.09 cm AV Vmax:        163.00 cm/s AV Peak Grad:   10.6 mmHg LVOT Vmax:      120.00 cm/s LVOT Vmean:     77.000 cm/s LVOT VTI:       0.189 m  AORTA Ao Root diam: 2.80 cm Ao Asc diam:  3.30 cm MITRAL VALVE MV Area (PHT): 4.68 cm    SHUNTS MV Decel Time: 162 msec    Systemic VTI:  0.19 m MV E velocity: 80.20 cm/s  Systemic Diam: 1.90 cm MV A velocity: 93.80 cm/s MV E/A ratio:  0.86 Sunit Tolia Electronically signed by Madonna Large Signature Date/Time: 05/18/2024/6:26:28 PM    Final      Discharge Instructions:  You were hospitalized for reflux. Thank you for allowing us  to be part of your care.   We arranged for you to follow up with your primary care doctor Prince Amoako on 05/25/2024 at 3:10 PM    Please note these changes made to your medications:   *Please START taking:  Protonix  40mg  twice a day Reglan  5mg  30 minutes before meals three times a day and once before bed Amlodipine  5mg  once a day Miralax  once daily Senna once daily  *Please STOP taking:  Tramadol  Mounjaro   Please make sure to follow up with your pulmonologist on 7/28 at 4:00PM  Please call our clinic if you have any questions or concerns, we may be able to help and keep you from a long and expensive emergency room wait. Our clinic and after hours phone number is [phone], the best time to call is Monday through Friday 9 am to 4 pm but there is always someone available 24/7 if you have an emergency. If you need medication refills please notify your pharmacy one week in advance and they will send us  a request.   Signed: Napoleon Limes, MD 05/21/2024, 3:18 PM

## 2024-05-21 NOTE — TOC Transition Note (Signed)
 Transition of Care [facility]) - Discharge Note   Patient Details  Name: Cathy Hall MRN: [id number] Date of Birth: [birth year]  [facility] CM/SW Contact:  Roxie KANDICE Stain, RN Phone Number: 05/21/2024, 5:50 PM   Clinical Narrative:    Patient stable for discharge. NIV ordered from [employer] and setup will be done at home.    Final next level of care: Home/Self Care Barriers to Discharge: Barriers Resolved   Patient Goals and CMS Choice Patient states their goals for this hospitalization and ongoing recovery are:: return home CMS Medicare.gov Compare Post Acute Care list provided to:: Patient Choice offered to / list presented to : Patient      Discharge Placement                       Discharge Plan and Services Additional resources added to the After Visit Summary for     Discharge Planning Services: CM Consult            DME Arranged: NIV DME Agency: [employer] Date DME Agency Contacted: 05/20/24 Time DME Agency Contacted: 1400 Representative spoke with at DME Agency: [name]            Social Drivers of Health (SDOH) Interventions SDOH Screenings   Food Insecurity: Food Insecurity Present (05/19/2024)  Housing: High Risk (05/19/2024)  Transportation Needs: Unmet Transportation Needs (05/19/2024)  Utilities: Not At Risk (05/19/2024)  Alcohol  Screen: Low Risk  (11/21/2021)  Depression (PHQ2-9): Low Risk  (03/26/2024)  Financial Resource Strain: Low Risk  (11/05/2018)   Received from [facility]  Physical Activity: Insufficiently Active (11/05/2018)   Received from [facility]  Social Connections: Unknown (03/04/2023)   Received from [facility]  Stress: No Stress Concern Present (11/05/2018)   Received from [facility]  Tobacco Use: High Risk (05/18/2024)     Readmission Risk Interventions    04/21/2023   11:39 AM  Readmission Risk Prevention Plan  Transportation Screening  Complete  Medication Review (RN Care Manager) Complete  HRI or Home Care Consult Complete  SW Recovery Care/Counseling Consult Complete  Palliative Care Screening Not Applicable

## 2024-05-21 NOTE — Consult Note (Signed)
 Consultation  Referring Provider: Medicine service / Trudy Primary Care Physician:  Renne Homans, MD Primary Gastroenterologist:  unassigned  Reason for Consultation: Chest pain, GERD, distended stomach on imaging  HPI: Cathy Hall is a 53 y.o. female with history of morbid obesity, insulin -dependent diabetes mellitus, obesity hypoventilation syndrome, heart failure with preserved EF, prior CVA, COPD, history of substance abuse who was admitted 3 days ago with complaints of chest pain.  She presented after an episode that she described as crushing and had been present intermittently over the previous 3 days.  She had expressed radiation of pain into her left arm and family history of heart disease. She tells me that she has been having intermittent symptoms with the chest pain over the past couple of months but it had progressed at the time she had presented to the emergency room. She has been seen and evaluated by cardiology as did have elevated high-sensitivity troponins Pulmonary CTA was negative for PE but did find atherosclerotic changes of her heart, and also noted a distended stomach full of fluid and underlying hepatic steatosis, small hiatal hernia. Her chest pain was felt atypical for acute coronary syndrome. 2D echo was done and she was also considered for possible pericarditis but ruled out for that. Echo showed EF greater than 75% increased right ventricular wall thickness no pericardial effusion normal valve function.  Patient says that she started Mounjaro  a couple of months ago, has not had any dosage increase thus far.  She says her symptoms started after she began on Mounjaro .  He has noted early satiety, and says she has been having a lot of belching and burping.  She has not had any significant nausea and no vomiting.  She says she has history of GERD and had been taking an over-the-counter omeprazole  chronically until she establish care here apparently  and has not been on a PPI.  She says her symptoms are usually worse after eating when she will develop chest discomfort and increased belching.  She has not had any abdominal pain, no vomiting, no dysphagia or odynophagia. She has been losing weight since starting Mounjaro  .  Has noticed some mild constipation.  Has used MiraLAX  in the past with success.  She did have a remote EGD done at [facility] in 2020 after she had an accidental ingestion of Roundup and at that time was noted to have esophageal candidiasis but no toxic injury.  She feels that her symptoms have improved somewhat with the regimen she has been on here which includes twice daily PPI, Carafate , Reglan  5 mg AC and nightly.  She had been taking some NSAIDs for the pain that she been having off-and-on over the past couple months no more than 2/day denies current EtOH  Labs reviewed-today glucose 200/BUN 19/creatinine 1.16 LFTs normal on 05/18/2024 CBC normal 05/18/2024   Past Medical History:  Diagnosis Date   Anxiety    Asthma    COPD (chronic obstructive pulmonary disease) (HCC)    CVA (cerebral vascular accident) (HCC)    Depression    Heart failure with preserved ejection fraction (HCC)    Hypertension    Major depression    Obesity hypoventilation syndrome (HCC)    Polysubstance abuse (HCC)    PTSD (post-traumatic stress disorder)     Past Surgical History:  Procedure Laterality Date   BREAST BIOPSY Left 10/22/2023   US  LT BREAST BX W LOC DEV 1ST LESION IMG BX SPEC US  GUIDE 10/22/2023 [facility] MAMMOGRAPHY  ECTOPIC PREGNANCY SURGERY      Prior to Admission medications   Medication Sig Start Date End Date Taking? Authorizing Provider  acetaminophen  (TYLENOL ) 325 MG tablet Take 650 mg by mouth 2 (two) times daily as needed for fever, headache or moderate pain.   Yes [provider]  albuterol  (VENTOLIN  HFA) 108 (90 Base) MCG/ACT inhaler Inhale 2 puffs into the lungs every 4 (four) hours as needed for wheezing  or shortness of breath. 11/11/23 11/10/24 Yes Masters, Katie, DO  aspirin  EC 81 MG tablet Take 1 tablet (81 mg total) by mouth daily. Swallow whole. 11/11/23 11/10/24 Yes Masters, Katie, DO  atorvastatin  (LIPITOR) 40 MG tablet Take 1 tablet (40 mg total) by mouth daily. 11/11/23 11/10/24 Yes Masters, Katie, DO  conjugated estrogens  (PREMARIN ) vaginal cream Apply one applicator INTRAVAGINALLY twice a week 03/26/24  Yes Alexander-Savino, [place], MD  cyclobenzaprine  (FLEXERIL ) 10 MG tablet Take 1 tablet (10 mg total) by mouth 3 (three) times daily as needed for muscle spasms. 04/27/24  Yes Amoako, Prince, MD  diclofenac  Sodium (VOLTAREN ) 1 % GEL Apply 1 Application topically 4 (four) times daily as needed (pain). 11/11/23  Yes Masters, Katie, DO  DULoxetine  (CYMBALTA ) 30 MG capsule Take 1 capsule (30 mg total) by mouth daily. 05/06/24 05/06/25 Yes Amoako, Prince, MD  empagliflozin  (JARDIANCE ) 10 MG TABS tablet Take 1 tablet (10 mg total) by mouth daily before breakfast. 02/27/24  Yes Elnora Ip, MD  Ferrous Sulfate (IRON PO) Take 1 tablet by mouth daily.   Yes [provider]  fluticasone  (FLONASE ) 50 MCG/ACT nasal spray PLACE 2 SPRAYS INTO BOTH NOSTRILS DAILY. Patient taking differently: Place 2 sprays into both nostrils daily as needed for allergies. 11/30/20 05/18/24 Yes Sheikh, Omair Latif, DO  furosemide  (LASIX ) 40 MG tablet Take 1 tablet (40 mg total) by mouth daily. 05/06/24 05/06/25 Yes Amoako, Prince, MD  hydrOXYzine  (ATARAX ) 50 MG tablet Take 1 tablet (50 mg total) by mouth every 6 (six) hours as needed for anxiety. 11/11/23  Yes Masters, Katie, DO  insulin  degludec (TRESIBA ) 100 UNIT/ML FlexTouch Pen Inject 20 Units into the skin daily. 01/02/24  Yes Tobie Gaines, DO  loratadine  (CLARITIN  REDITABS) 10 MG dissolvable tablet Place 1 tablet (10 mg total) on the tongue and allow to dissolve daily. 11/11/23  Yes Masters, Katie, DO  melatonin 3 MG TABS tablet Take 2 tablets (6 mg total) by mouth at bedtime.  11/29/21  Yes McQuilla, Jai B, MD  mometasone -formoterol  (DULERA ) 100-5 MCG/ACT AERO Inhale 2 puffs into the lungs 2 (two) times daily. 11/11/23 11/10/24 Yes Masters, Katie, DO  montelukast  (SINGULAIR ) 10 MG tablet Take 1 tablet (10 mg total) by mouth at bedtime. 11/11/23 11/10/24 Yes Masters, Katie, DO  nicotine  (NICODERM CQ  - DOSED IN MG/24 HOURS) 14 mg/24hr patch Place 1 patch (14 mg total) onto the skin daily. 11/12/23  Yes Masters, Katie, DO  olmesartan  (BENICAR ) 20 MG tablet Take 1 tablet (20 mg total) by mouth daily. 11/11/23  Yes Masters, Katie, DO  pantoprazole  (PROTONIX ) 20 MG tablet Take 1 tablet (20 mg total) by mouth daily. 05/17/24  Yes Tran, Bowie, PA-C  Polyvinyl Alcohol -Povidone (REFRESH OP) Place 1 drop into both eyes 2 (two) times daily as needed (dryness).   Yes [provider]  pregabalin  (LYRICA ) 50 MG capsule Take 1 capsule (50 mg total) by mouth 3 (three) times daily. 11/11/23  Yes Masters, Katie, DO  tiotropium (SPIRIVA ) 18 MCG inhalation capsule Place 1 capsule (18 mcg total) into handihaler and inhale  daily. *do not swallow capsule* 12/10/23  Yes Kassie Acquanetta Bradley, MD  tirzepatide  (MOUNJARO ) 2.5 MG/0.5ML Pen Inject 2.5 mg into the skin once a week. 03/09/24 02/08/25 Yes Masters, Katie, DO  traMADol  (ULTRAM ) 50 MG tablet TAKE 2 TABLETS (100 MG TOTAL) BY MOUTH EVERY 12 (TWELVE) HOURS AS NEEDED. 12/11/23  Yes Masters, Katie, DO  Multiple Vitamin (MULTIVITAMIN WITH MINERALS) TABS tablet Take 1 tablet by mouth daily. Patient not taking: Reported on 05/18/2024 11/30/20   Sheikh, Omair Latif, DO    Current Facility-Administered Medications  Medication Dose Route Frequency Provider Last Rate Last Admin   acetaminophen  (TYLENOL ) tablet 650 mg  650 mg Oral Q6H PRN Patel, Amar, DO   650 mg at 05/20/24 2116   Or   acetaminophen  (TYLENOL ) suppository 650 mg  650 mg Rectal Q6H PRN Tobie Gaines, DO       albuterol  (PROVENTIL ) (2.5 MG/3ML) 0.083% nebulizer solution 2.5 mg  2.5 mg Inhalation Q4H PRN  Tobie Gaines, DO       alum & mag hydroxide-simeth (MAALOX/MYLANTA) 200-200-20 MG/5ML suspension 30 mL  30 mL Oral Q6H PRN Smucker, Nathanael, MD   30 mL at 05/21/24 0230   amLODipine  (NORVASC ) tablet 5 mg  5 mg Oral Daily Smucker, Nathanael, MD   5 mg at 05/21/24 9081   aspirin  EC tablet 81 mg  81 mg Oral Daily Tobie Gaines, DO   81 mg at 05/21/24 9081   atorvastatin  (LIPITOR) tablet 40 mg  40 mg Oral Daily Tobie Gaines, DO   40 mg at 05/21/24 9081   diclofenac  Sodium (VOLTAREN ) 1 % topical gel 4 g  4 g Topical QID Tobie Gaines, DO   4 g at 05/21/24 0919   DULoxetine  (CYMBALTA ) DR capsule 30 mg  30 mg Oral Daily Tobie Gaines, DO   30 mg at 05/21/24 9081   empagliflozin  (JARDIANCE ) tablet 10 mg  10 mg Oral QAC breakfast Tobie Gaines, DO   10 mg at 05/21/24 9081   enoxaparin  (LOVENOX ) injection 40 mg  40 mg Subcutaneous Daily Tobie Gaines, DO   40 mg at 05/21/24 9081   fluticasone  furoate-vilanterol (BREO ELLIPTA ) 100-25 MCG/ACT 1 puff  1 puff Inhalation Daily Tobie Gaines, DO   1 puff at 05/21/24 0858   furosemide  (LASIX ) tablet 40 mg  40 mg Oral Daily Tobie Gaines, DO   40 mg at 05/21/24 9081   hydrOXYzine  (ATARAX ) tablet 50 mg  50 mg Oral Q6H PRN Patel, Amar, DO   50 mg at 05/20/24 2116   insulin  aspart (novoLOG ) injection 0-5 Units  0-5 Units Subcutaneous QHS Patel, Amar, DO       insulin  aspart (novoLOG ) injection 0-9 Units  0-9 Units Subcutaneous TID WC Patel, Amar, DO   2 Units at 05/21/24 9371   insulin  glargine-yfgn (SEMGLEE ) injection 15 Units  15 Units Subcutaneous Daily Tobie Gaines, DO   15 Units at 05/21/24 9081   irbesartan  (AVAPRO ) tablet 150 mg  150 mg Oral Daily Tobie Gaines, DO   150 mg at 05/21/24 9081   lidocaine  (LIDODERM ) 5 % 1 patch  1 patch Transdermal Daily Tobie Gaines, DO   1 patch at 05/21/24 9082   metoCLOPramide  (REGLAN ) tablet 5 mg  5 mg Oral TID AC & HS Trudy Mliss Dragon, MD   5 mg at 05/21/24 9081   montelukast  (SINGULAIR ) tablet 10 mg  10 mg Oral QHS Patel, Amar, DO   10 mg  at 05/20/24 2116   nitroGLYCERIN  (NITROSTAT ) SL tablet 0.4 mg  0.4 mg Sublingual Q5 min PRN Smucker, Nathanael, MD       pantoprazole  (PROTONIX ) EC tablet 40 mg  40 mg Oral BID Smucker, Melvenia, MD   40 mg at 05/21/24 9081   polyethylene glycol (MIRALAX  / GLYCOLAX ) packet 17 g  17 g Oral Daily D'Mello, Rosalyn, DO   17 g at 05/21/24 9081   senna-docusate (Senokot-S) tablet 1 tablet  1 tablet Oral BID D'Mello, Rosalyn, DO   1 tablet at 05/21/24 9081   sucralfate  (CARAFATE ) 1 GM/10ML suspension 1 g  1 g Oral TID WC & HS D'Mello, Rosalyn, DO   1 g at 05/21/24 0918    Allergies as of 05/17/2024 - Review Complete 05/17/2024  Allergen Reaction Noted   Nsaids Other (See Comments) 11/27/2018    Family History  Problem Relation Age of Onset   Cerebral aneurysm Mother    Diabetes Sister    Other Neg Hx     Social History   Socioeconomic History   Marital status: Significant Other    Spouse name: Not on file   Number of children: Not on file   Years of education: Not on file   Highest education level: Not on file  Occupational History   Not on file  Tobacco Use   Smoking status: Every Day    Current packs/day: 0.50    Types: Cigarettes   Smokeless tobacco: Never  Vaping Use   Vaping status: Some Days  Substance and Sexual Activity   Alcohol  use: Yes    Comment: occasional   Drug use: Yes    Types: Cocaine, Marijuana    Comment: pt states no longer uses cocaine   Sexual activity: Not Currently  Other Topics Concern   Not on file  Social History Narrative   ** Merged History Encounter **       Social Drivers of Health   Financial Resource Strain: Low Risk  (11/05/2018)   Received from [facility]   Overall Financial Resource Strain (CARDIA)    Difficulty of Paying Living Expenses: Not hard at all  Food Insecurity: Food Insecurity Present (05/19/2024)   Hunger Vital Sign    Worried About Programme researcher, broadcasting/film/video in the Last Year: Sometimes true    Ran Out of  Food in the Last Year: Sometimes true  Transportation Needs: Unmet Transportation Needs (05/19/2024)   PRAPARE - Administrator, Civil Service (Medical): Yes    Lack of Transportation (Non-Medical): Yes  Physical Activity: Insufficiently Active (11/05/2018)   Received from [facility]   Exercise Vital Sign    On average, how many days per week do you engage in moderate to strenuous exercise (like a brisk walk)?: 2 days    On average, how many minutes do you engage in exercise at this level?: 30 min  Stress: No Stress Concern Present (11/05/2018)   Received from [facility] Occup[facility] - Occupational Stress Questionnaire    Feeling of Stress : Not at all  Social Connections: Unknown (03/04/2023)   Received from [facility]   Social Connections    Frequency of Communication with Friends and Family: Not asked    Frequency of Social Gatherings with Friends and Family: Not asked  Intimate Partner Violence: Not At Risk (05/19/2024)   Humiliation, Afraid, Rape, and Kick questionnaire    Fear of Current or Ex-Partner: No    Emotionally Abused: No    Physically Abused: No  Sexually Abused: No    Review of Systems: Pertinent positive and negative review of systems were noted in the above HPI section.  All other review of systems was otherwise negative.   Physical Exam: Vital signs in last 24 hours: Temp:  [97.6 F (36.4 C)-98.3 F (36.8 C)] 97.6 F (36.4 C) (07/17 0806) Pulse Rate:  [89-100] 100 (07/17 0601) Resp:  [16-20] 16 (07/17 0806) BP: (132-192)/(76-124) 134/76 (07/17 0806) SpO2:  [94 %-97 %] 94 % (07/17 0601) Last BM Date : 05/19/24 General:   Alert,  Well-developed, morbidly obese African-American female pleasant and cooperative in NAD Head:  Normocephalic and atraumatic. Eyes:  Sclera clear, no icterus.   Conjunctiva pink. Ears:  Normal auditory acuity. Nose:  No deformity, discharge,  or  lesions. Mouth:  No deformity or lesions.   Neck:  Supple; no masses or thyromegaly. Lungs:  Clear throughout to auscultation.   No wheezes, crackles, or rhonchi.  Heart:  Regular rate and rhythm; no murmurs, clicks, rubs,  or gallops. Abdomen:  Soft, obese nontender, BS active,nonpalp mass or hsm.   Rectal: Not done Msk:  Symmetrical without gross deformities. . Pulses:  Normal pulses noted. Extremities:  Without clubbing or edema. Neurologic:  Alert and  oriented x4;  grossly normal neurologically. Skin:  Intact without significant lesions or rashes.. Psych:  Alert and cooperative. Normal mood and affect.  Intake/Output from previous day: 07/16 0701 - 07/17 0700 In: 360 [P.O.:360] Out: -  Intake/Output this shift: No intake/output data recorded.  Lab Results: No results for input(s): WBC, HGB, HCT, PLT in the last 72 hours. BMET Recent Labs    05/19/24 0450 05/20/24 0243 05/21/24 0319  NA 136 137 136  K 4.6 4.3 3.9  CL 97* 99 101  CO2 26 26 24   GLUCOSE 182* 151* 200*  BUN 18 17 19   CREATININE 1.20* 0.95 1.16*  CALCIUM  9.8 9.2 9.0   LFT No results for input(s): PROT, ALBUMIN, AST, ALT, ALKPHOS, BILITOT, BILIDIR, IBILI in the last 72 hours. PT/INR No results for input(s): LABPROT, INR in the last 72 hours. Hepatitis Panel No results for input(s): HEPBSAG, HCVAB, HEPAIGM, HEPBIGM in the last 72 hours.   IMPRESSION:  #22 53 year old morbidly obese African-American female with insulin -dependent diabetes mellitus, COPD, sleep apnea, hypertension, hyperlipidemia, substance abuse, prior CVA and history of heart failure with preserved EF who presented to the emergency room 3 days ago with complaints of intermittent chest pain with some radiation to her left arm which had been going on for 3 days intermittently prior to admission.  On further discussion now she says she had been having intermittent symptoms over the past couple of months and  does now correlate onset of symptoms 2 after starting Mounjaro  couple of months ago  Has had cardiac workup here, symptoms not felt consistent with acute coronary syndrome, and cardiology has signed off. Echo with EF of 75%, no valvular issues  Patient's current symptoms are consistent with GERD with chest pain usually exacerbated by eating, and has been associated with belching and burping, no dysphagia or odynophagia, no abdominal pain but has endorsed early satiety, and some nausea. Does have history of GERD has not recently been taking a PPI  I think she has underlying GERD and may have underlying gastroparesis related to her diabetes both of which have been exacerbated by Mounjaro .  Plan; no indication for EGD at present Okay to continue on Mounjaro  but would not increase dose until current symptoms are well-controlled With twice  daily PPI and would leave her on prescription twice daily PPI on discharge Can discontinue Carafate  Continue metoclopramide  5 mg 30 minutes AC We discussed elevating the head of the bed 45 degrees for sleep and remaining upright during the day She needs to be on a gastroparesis step 3 diet, please ask dietitian to discuss with her- MiraLAX  17 g in 8 ounces of water  daily for home. If she does well today, can be discharged GI available if needed    Prapti Grussing PA-C 05/21/2024, 9:20 AM

## 2024-05-21 NOTE — Plan of Care (Signed)

## 2024-05-21 NOTE — Telephone Encounter (Signed)
 Called pt - no answer. VM has not been set-up, unable to leave a message. According to chart, pt is currently in the hospital; to be discharged today.

## 2024-05-21 NOTE — Progress Notes (Signed)
@   1236 pt complained of burning sensation, notified Patel, DO. See new orders. Pt also noted to have multiple diet soda drinks on her beside table. Pt educated on limiting fluid intake as well as carbonated beverages.  @ [phone] pt stated, my chest is on fire. Notified Patel, DO. See new orders.   Lonell LITTIE Lyme, RN

## 2024-05-21 NOTE — Progress Notes (Signed)
 Referred from hospitalization for grief, healthy attitudes towards food, and MDD with anxiety.  Ozell Kung MD 05/21/2024, 2:42 PM

## 2024-05-21 NOTE — Telephone Encounter (Signed)
 Copied from CRM [id number]. Topic: General - Other >> May 21, 2024  4:27 PM Cathy Hall wrote: Reason for CRM: Patient wants Dr norrine to know that the breathing machine was approved and it is on the way to the hospital, also she wants him to go ahead and send in her prescriptions

## 2024-05-22 ENCOUNTER — Other Ambulatory Visit: Payer: Self-pay

## 2024-05-25 ENCOUNTER — Ambulatory Visit: Payer: MEDICAID | Admitting: Student

## 2024-05-25 ENCOUNTER — Telehealth: Payer: Self-pay | Admitting: Student

## 2024-05-25 NOTE — Telephone Encounter (Signed)
 Patient was contact via telephone to reschedule appt that she no showed today with Dr. Amoako.  Was able to make contact with the patient and she was agreeable to coming in for new appt date on 05/27/24 at 3:15 pm.

## 2024-05-27 ENCOUNTER — Other Ambulatory Visit: Payer: Self-pay

## 2024-05-27 ENCOUNTER — Other Ambulatory Visit (HOSPITAL_COMMUNITY): Payer: Self-pay

## 2024-05-27 ENCOUNTER — Ambulatory Visit: Payer: MEDICAID

## 2024-05-27 VITALS — BP 113/68 | HR 117 | Temp 98.9°F | Ht 61.0 in | Wt 278.8 lb

## 2024-05-27 DIAGNOSIS — M545 Low back pain, unspecified: Secondary | ICD-10-CM

## 2024-05-27 DIAGNOSIS — K3184 Gastroparesis: Secondary | ICD-10-CM | POA: Diagnosis not present

## 2024-05-27 MED ORDER — CYCLOBENZAPRINE HCL 10 MG PO TABS
10.0000 mg | ORAL_TABLET | Freq: Every day | ORAL | 0 refills | Status: DC
Start: 2024-05-27 — End: 2024-06-29
  Filled 2024-05-27 (×2): qty 30, 30d supply, fill #0

## 2024-05-27 MED ORDER — MILK OF MAGNESIA 7.75 % PO SUSP
30.0000 mL | Freq: Every day | ORAL | 3 refills | Status: AC | PRN
  Filled 2024-05-27: qty 360, 12d supply, fill #0

## 2024-05-27 NOTE — Assessment & Plan Note (Addendum)
 Patient presents with dyspepsia, reflux, bloating, and constipation. She was hospitalized on 7/14 for atypical chest pain; cardiac and pulmonary causes were ruled out. During hospitalization, she experienced severe chest pain radiating to the back and left shoulder due to GERD. Imaging showed dilated stomach contents. Diagnosis was GERD with gastroparesis, possibly related to GLP-1 use (Mounjaro  was discontinued). She complains of delayed bowel movements, with last stool passing this morning. She reports nausea without vomiting and ongoing bloating. Current medications include Protonix  40 mg BID, Reglan  5 mg, Miralax , and Senna. Abdomen: soft, distended, non-tender, no guarding. Plan: Start magnesium  hydroxide 60 cc with a full glass of water  for constipation Refer to GI for further evaluation and consider gastric emptying study Monitor symptom control Reglan  may be discontinued prior to gastric emptying study Follow up as needed

## 2024-05-27 NOTE — Progress Notes (Signed)
 CC: Bloating and reflux, likely related to gastroparesis from recent Mounjaro  use  HPI:  [name] is a 53 y.o. female living with a history stated below and presents today for bloating and reflux, likely related to gastroparesis from recent Mounjaro  use. Please see problem based assessment and plan for additional details.  Past Medical History:  Diagnosis Date   Anxiety    Asthma    COPD (chronic obstructive pulmonary disease) (HCC)    CVA (cerebral vascular accident) (HCC)    Depression    Heart failure with preserved ejection fraction (HCC)    Hypertension    Major depression    Obesity hypoventilation syndrome (HCC)    Polysubstance abuse (HCC)    PTSD (post-traumatic stress disorder)     Current Outpatient Medications on File Prior to Visit  Medication Sig Dispense Refill   acetaminophen  (TYLENOL ) 325 MG tablet Take 650 mg by mouth 2 (two) times daily as needed for fever, headache or moderate pain.     albuterol  (VENTOLIN  HFA) 108 (90 Base) MCG/ACT inhaler Inhale 2 puffs into the lungs every 4 (four) hours as needed for wheezing or shortness of breath. 36 g 0   amLODipine  (NORVASC ) 5 MG tablet Take 1 tablet (5 mg total) by mouth daily. 30 tablet 0   aspirin  EC 81 MG tablet Take 1 tablet (81 mg total) by mouth daily. Swallow whole. 90 tablet 3   atorvastatin  (LIPITOR) 40 MG tablet Take 1 tablet (40 mg total) by mouth daily. 90 tablet 3   conjugated estrogens  (PREMARIN ) vaginal cream Apply one applicator INTRAVAGINALLY twice a week 42.5 g 12   diclofenac  Sodium (VOLTAREN ) 1 % GEL Apply 1 Application topically 4 (four) times daily as needed (pain). 400 g 3   DULoxetine  (CYMBALTA ) 30 MG capsule Take 1 capsule (30 mg total) by mouth daily. 90 capsule 3   empagliflozin  (JARDIANCE ) 10 MG TABS tablet Take 1 tablet (10 mg total) by mouth daily before breakfast. 90 tablet 3   Ferrous Sulfate (IRON PO) Take 1 tablet by mouth daily.     fluticasone  (FLONASE ) 50  MCG/ACT nasal spray PLACE 2 SPRAYS INTO BOTH NOSTRILS DAILY. (Patient taking differently: Place 2 sprays into both nostrils daily as needed for allergies.) 16 g 0   furosemide  (LASIX ) 40 MG tablet Take 1 tablet (40 mg total) by mouth daily. 90 tablet 3   hydrOXYzine  (ATARAX ) 50 MG tablet Take 1 tablet (50 mg total) by mouth every 6 (six) hours as needed for anxiety. 30 tablet 0   insulin  degludec (TRESIBA ) 100 UNIT/ML FlexTouch Pen Inject 20 Units into the skin daily. 15 mL 3   loratadine  (CLARITIN  REDITABS) 10 MG dissolvable tablet Place 1 tablet (10 mg total) on the tongue and allow to dissolve daily. 30 tablet 0   melatonin 3 MG TABS tablet Take 2 tablets (6 mg total) by mouth at bedtime. 60 tablet 0   metoCLOPramide  (REGLAN ) 5 MG tablet Take 1 tablet (5 mg total) by mouth 4 (four) times daily -  before meals and at bedtime. 120 tablet 0   mometasone -formoterol  (DULERA ) 100-5 MCG/ACT AERO Inhale 2 puffs into the lungs 2 (two) times daily. 13 g 11   montelukast  (SINGULAIR ) 10 MG tablet Take 1 tablet (10 mg total) by mouth at bedtime. 30 tablet 11   Multiple Vitamin (MULTIVITAMIN WITH MINERALS) TABS tablet Take 1 tablet by mouth daily. (Patient not taking: Reported on 05/18/2024) 30 tablet 0   nicotine  (NICODERM CQ  - DOSED IN  MG/24 HOURS) 14 mg/24hr patch Place 1 patch (14 mg total) onto the skin daily. 28 patch 0   olmesartan  (BENICAR ) 20 MG tablet Take 1 tablet (20 mg total) by mouth daily. 90 tablet 3   pantoprazole  (PROTONIX ) 40 MG tablet Take 1 tablet (40 mg total) by mouth 2 (two) times daily. 60 tablet 0   polyethylene glycol (MIRALAX  / GLYCOLAX ) 17 g packet Mix 1 packet (17 g) in beverage and take by mouth daily. 14 each 0   Polyvinyl Alcohol -Povidone (REFRESH OP) Place 1 drop into both eyes 2 (two) times daily as needed (dryness).     pregabalin  (LYRICA ) 50 MG capsule Take 1 capsule (50 mg total) by mouth 3 (three) times daily. 90 capsule 2   senna-docusate (SENOKOT-S) 8.6-50 MG tablet Take  1 tablet by mouth 2 (two) times daily. 60 tablet 0   tiotropium (SPIRIVA ) 18 MCG inhalation capsule Place 1 capsule (18 mcg total) into handihaler and inhale daily. *do not swallow capsule* 90 capsule 3   [Paused] tirzepatide  (MOUNJARO ) 2.5 MG/0.5ML Pen Inject 2.5 mg into the skin once a week. 2 mL 11   No current facility-administered medications on file prior to visit.    Family History  Problem Relation Age of Onset   Cerebral aneurysm Mother    Diabetes Sister    Other Neg Hx     Social History   Socioeconomic History   Marital status: Significant Other    Spouse name: Not on file   Number of children: Not on file   Years of education: Not on file   Highest education level: Not on file  Occupational History   Not on file  Tobacco Use   Smoking status: Every Day    Current packs/day: 0.50    Types: Cigarettes   Smokeless tobacco: Never  Vaping Use   Vaping status: Some Days  Substance and Sexual Activity   Alcohol  use: Yes    Comment: occasional   Drug use: Yes    Types: Cocaine, Marijuana    Comment: pt states no longer uses cocaine   Sexual activity: Not Currently  Other Topics Concern   Not on file  Social History Narrative   ** Merged History Encounter **       Social Drivers of Health   Financial Resource Strain: Low Risk  (11/05/2018)   Received from [facility]   Overall Financial Resource Strain (CARDIA)    Difficulty of Paying Living Expenses: Not hard at all  Food Insecurity: Food Insecurity Present (05/19/2024)   Hunger Vital Sign    Worried About Running Out of Food in the Last Year: Sometimes true    Ran Out of Food in the Last Year: Sometimes true  Transportation Needs: Unmet Transportation Needs (05/19/2024)   PRAPARE - Administrator, Civil Service (Medical): Yes    Lack of Transportation (Non-Medical): Yes  Physical Activity: Insufficiently Active (11/05/2018)   Received from [facility]    Exercise Vital Sign    On average, how many days per week do you engage in moderate to strenuous exercise (like a brisk walk)?: 2 days    On average, how many minutes do you engage in exercise at this level?: 30 min  Stress: No Stress Concern Present (11/05/2018)   Received from [facility] - Occupational Stress Questionnaire    Feeling of Stress : Not at all  Social Connections: Unknown (03/04/2023)  [facility] from [facility]   Social Connections    Frequency of Communication with Friends and Family: Not asked    Frequency of Social Gatherings with Friends and Family: Not asked  Intimate Partner Violence: Not At Risk (05/19/2024)   Humiliation, Afraid, Rape, and Kick questionnaire    Fear of Current or Ex-Partner: No    Emotionally Abused: No    Physically Abused: No    Sexually Abused: No    Review of Systems: ROS negative except for what is noted on the assessment and plan.  Vitals:   05/27/24 1553  BP: 113/68  Pulse: (!) 117  Temp: 98.9 F (37.2 C)  TempSrc: Oral  SpO2: 99%  Weight: 278 lb 12.8 oz (126.5 kg)  Height: 5' 1 (1.549 m)    Physical Exam  Physical Exam: Constitutional: well-appearing in no acute distress HENT: normocephalic atraumatic, mucous membranes moist Eyes: conjunctiva non-erythematous Neck: supple Abdomen: soft, distended, non-tender, no guarding. 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 She was hospitalized on 7/14 for atypical chest pain; cardiac and pulmonary causes were ruled out. During hospitalization, she experienced severe chest pain radiating to the back  and left shoulder due to GERD. Imaging showed dilated stomach contents. Diagnosis was GERD with gastroparesis, possibly related to GLP-1 use (Mounjaro  was discontinued). She complains of delayed bowel movements, with last stool passing this morning. She reports nausea without vomiting and ongoing bloating. Current medications include Protonix  40 mg BID, Reglan  5 mg, Miralax , and Senna. Abdomen: soft, distended, non-tender, no guarding. Plan: Start magnesium  hydroxide 60 cc with a full glass of water  for constipation Refer to GI for further evaluation and consider gastric emptying study Monitor symptom control Reglan  may be discontinued prior to gastric emptying study Follow up as needed   Patient seen with Dr. Francesco Armando Rossetti M.D [facility] Internal Medicine, PGY-1 Phone: [phone] Date 05/28/2024 Time 8:24 AM

## 2024-05-27 NOTE — Telephone Encounter (Signed)
 Patient has arrived to her appt today.  Copied from CRM #[id number]. Topic: Appointments - Scheduling Inquiry for Clinic >> May 27, 2024  3:16 PM Alfonso ORN wrote: Reason for CRM: patient running 7 minutes late for her appointment

## 2024-05-27 NOTE — Patient Instructions (Addendum)
 Thank you, Ms. Anoushka Evet Dukes-Enaiho, for allowing us  to care for you today. We reviewed all your symptoms, and it appears they are likely side effects of Mounjaro . Please continue holding the medication for now. We will follow up in one month to reassess your blood sugar.  For your symptoms of reflux and constipation, we have started you on Milk of Magnesia. Please take it with a full glass of water . A referral to gastroenterology has also been sent, they will contact you directly.  In the meantime, stay well-hydrated and maintain a high-fiber diet. It was a pleasure seeing you today. Don't hesitate to reach out if you have any questions.  I have ordered the following labs for you:  Lab Orders  No laboratory test(s) ordered today      Referrals ordered today:   Referral Orders         Ambulatory referral to Gastroenterology       I have ordered the following medication/changed the following medications:   Stop the following medications: Medications Discontinued During This Encounter  Medication Reason   cyclobenzaprine  (FLEXERIL ) 10 MG tablet Reorder     Start the following medications: Meds ordered this encounter  Medications   magnesium  hydroxide (MILK OF MAGNESIA) 400 MG/5ML suspension    Sig: Take 30 mLs by mouth daily as needed for mild constipation.    Dispense:  360 mL    Refill:  3   cyclobenzaprine  (FLEXERIL ) 10 MG tablet    Sig: Take 1 tablet (10 mg total) by mouth at bedtime.    Dispense:  30 tablet    Refill:  0     Follow up:  1 month   Remember:   Should you have any questions or concerns please call the internal medicine clinic at [phone].    Armando Rossetti, M.D [facility] Internal Medicine Center

## 2024-05-28 ENCOUNTER — Other Ambulatory Visit (HOSPITAL_COMMUNITY): Payer: Self-pay

## 2024-05-29 ENCOUNTER — Other Ambulatory Visit (HOSPITAL_COMMUNITY): Payer: Self-pay

## 2024-05-29 NOTE — Progress Notes (Signed)
 Internal Medicine Clinic Attending  I was physically present during the key portions of the resident provided service and participated in the medical decision making of patient's management care. I reviewed pertinent patient test results.  The assessment, diagnosis, and plan were formulated together and I agree with the documentation in the resident's note.  Cherylene Corrente, MD

## 2024-06-01 ENCOUNTER — Ambulatory Visit: Payer: MEDICAID | Admitting: Pulmonary Disease

## 2024-06-01 ENCOUNTER — Encounter: Payer: Self-pay | Admitting: Pulmonary Disease

## 2024-06-03 ENCOUNTER — Telehealth: Payer: Self-pay | Admitting: *Deleted

## 2024-06-03 NOTE — Telephone Encounter (Signed)
 Call to patient states is feeling better after the Mounjaro  was stopped.  Wants to know if she can restart the medication. Will forward to PCP/Team.  Copied from CRM #[id number]. Topic: Clinical - Medication Question >> Jun 03, 2024  1:09 PM Miquel SAILOR wrote: Reason for CRM: tirzepatide  (MOUNJARO ) 2.5 MG/0.5ML Pen (Paused)-Patient request for un pause medication due to patient is feeling better. Needs call back [phone]

## 2024-06-05 ENCOUNTER — Institutional Professional Consult (permissible substitution): Payer: Self-pay | Admitting: Licensed Clinical Social Worker

## 2024-06-08 ENCOUNTER — Institutional Professional Consult (permissible substitution): Payer: Self-pay | Admitting: Licensed Clinical Social Worker

## 2024-06-09 ENCOUNTER — Ambulatory Visit (INDEPENDENT_AMBULATORY_CARE_PROVIDER_SITE_OTHER): Payer: MEDICAID | Admitting: Licensed Clinical Social Worker

## 2024-06-09 DIAGNOSIS — F4321 Adjustment disorder with depressed mood: Secondary | ICD-10-CM

## 2024-06-09 NOTE — BH Specialist Note (Signed)
 Integrated Behavioral Health Initial In-Person Visit  MRN: [id number] Name: Harley Mccartney [name]  Number of Integrated Behavioral Health Clinician visits: 1- Initial Visit  Session Start time: 1530    Session End time: 1600  Total time in minutes: 30    Types of Service: Introduction only  Interpretor:No. Interpretor Name and Language: N/A   Subjective: Shawntell Evet Stutsman is a 53 y.o. female accompanied by Partner/Significant Other Patient was referred by PCP for Counseling/ Housing/Food. Patient reports the following symptoms/concerns: The Licensed Clinical Engineer, building services (LCSW-A), acting as a Economist [facility]), initiated a session with patient. The [facility] introduced themselves, explained her role, and provided contact information to the patient. Confidentiality and mandated reporting were discussed, and the patient denied any suicidal ideations or intent to harm others. The Integrated Behavioral Health (IBH) approach was reviewed, and a PHQ-9 assessment was completed. [facility] educated patient on FNS benefits/ housing and Grief Counseling.   Duration of problem: More than a year; Severity of problem: moderate  Objective: Mood: NA and Affect: Appropriate Risk of harm to self or others: No plan to harm self or others  Life Context: Family and Social: Patient has extended family School/Work: Patient is disabled Self-Care: Not reported Life Changes: Loss of several family members  Patient and/or Family's Strengths/Protective Factors: Social connections  Goals Addressed: Patient will: Reduce symptoms of: Unmet Basic needs and Grief  Progress towards Goals: Ongoing  Interventions: Interventions utilized: Supportive Counseling  Standardized Assessments completed: PHQ-SADS     Patient and/or Family Response: Patient agrees to continued services via Telehealth/ telephone  Patient Centered Plan: Patient is on the following Treatment  Plan(s):  Telehealth/telephone counseling services  Clinical Assessment/Diagnosis  Grief   Assessment: Patient currently experiencing Grief and Housing instability.   Patient may benefit from Grief counseling and [employer].  Plan: Follow up with behavioral health clinician on : 07/01/2024/ Telehealth  Renda Pontes, MSW, LCSW-A She/Her Behavioral Health Clinician [facility]  Internal Medicine Center

## 2024-06-22 ENCOUNTER — Other Ambulatory Visit (HOSPITAL_COMMUNITY): Payer: Self-pay

## 2024-06-22 ENCOUNTER — Ambulatory Visit: Payer: MEDICAID

## 2024-06-22 ENCOUNTER — Other Ambulatory Visit: Payer: Self-pay | Admitting: Student

## 2024-06-22 ENCOUNTER — Ambulatory Visit: Payer: Self-pay

## 2024-06-22 ENCOUNTER — Encounter (HOSPITAL_COMMUNITY): Payer: Self-pay

## 2024-06-22 VITALS — BP 131/87 | HR 106 | Temp 98.3°F | Ht 61.0 in | Wt 285.0 lb

## 2024-06-22 DIAGNOSIS — N941 Unspecified dyspareunia: Secondary | ICD-10-CM

## 2024-06-22 DIAGNOSIS — Z7984 Long term (current) use of oral hypoglycemic drugs: Secondary | ICD-10-CM

## 2024-06-22 DIAGNOSIS — Z7985 Long-term (current) use of injectable non-insulin antidiabetic drugs: Secondary | ICD-10-CM

## 2024-06-22 DIAGNOSIS — E785 Hyperlipidemia, unspecified: Secondary | ICD-10-CM | POA: Diagnosis not present

## 2024-06-22 DIAGNOSIS — E118 Type 2 diabetes mellitus with unspecified complications: Secondary | ICD-10-CM

## 2024-06-22 DIAGNOSIS — R079 Chest pain, unspecified: Secondary | ICD-10-CM

## 2024-06-22 DIAGNOSIS — Z794 Long term (current) use of insulin: Secondary | ICD-10-CM

## 2024-06-22 DIAGNOSIS — E119 Type 2 diabetes mellitus without complications: Secondary | ICD-10-CM

## 2024-06-22 LAB — POCT GLYCOSYLATED HEMOGLOBIN (HGB A1C): Hemoglobin A1C: 9.2 % — AB (ref 4.0–5.6)

## 2024-06-22 LAB — GLUCOSE, CAPILLARY: Glucose-Capillary: 201 mg/dL — ABNORMAL HIGH (ref 70–99)

## 2024-06-22 MED ORDER — TIRZEPATIDE 2.5 MG/0.5ML ~~LOC~~ SOAJ
2.5000 mg | SUBCUTANEOUS | 11 refills | Status: DC
  Filled 2024-06-22 (×2): qty 2, 28d supply, fill #0

## 2024-06-22 MED ORDER — TIRZEPATIDE 2.5 MG/0.5ML ~~LOC~~ SOAJ: 2.5000 mg | SUBCUTANEOUS | Status: DC

## 2024-06-22 MED ORDER — ESTROGENS CONJUGATED 0.625 MG/GM VA CREA
TOPICAL_CREAM | VAGINAL | 12 refills | Status: AC
  Filled 2024-06-22: qty 60, fill #0
  Filled 2024-06-22: qty 30, 52d supply, fill #0
  Filled 2024-10-02: qty 30, 52d supply, fill #1

## 2024-06-22 MED ORDER — FUROSEMIDE 20 MG PO TABS
20.0000 mg | ORAL_TABLET | Freq: Three times a day (TID) | ORAL | 11 refills | Status: DC
  Filled 2024-06-22: qty 90, 30d supply, fill #0
  Filled 2024-06-22: qty 30, 10d supply, fill #0
  Filled 2024-08-11: qty 90, 30d supply, fill #1

## 2024-06-22 MED ORDER — ATORVASTATIN CALCIUM 80 MG PO TABS
80.0000 mg | ORAL_TABLET | Freq: Every day | ORAL | 3 refills | Status: AC
  Filled 2024-06-22 (×2): qty 90, 90d supply, fill #0
  Filled 2024-10-02: qty 90, 90d supply, fill #1
  Filled 2024-12-09: qty 90, 90d supply, fill #2

## 2024-06-22 MED ORDER — NITROGLYCERIN 0.4 MG SL SUBL
SUBLINGUAL_TABLET | SUBLINGUAL | 12 refills | Status: DC
  Filled 2024-06-22: qty 25, 15d supply, fill #0
  Filled 2024-06-22: qty 25, 30d supply, fill #0
  Filled 2024-06-29 (×2): qty 25, 15d supply, fill #1

## 2024-06-22 NOTE — Telephone Encounter (Signed)
 FYI Only or Action Required?: FYI only for provider.  Patient was last seen in primary care on 05/27/2024 by Bernadine Manos, MD.  Called Nurse Triage reporting Leg Swelling.  Symptoms began ongoing since discharge from hospital stay on 05/21/2024.  Interventions attempted: Nothing.  Symptoms are: gradually worsening.  Triage Disposition: See HCP Within 4 Hours (Or PCP Triage)  Patient/caregiver understands and will follow disposition?: Yes     Copied from CRM #[id number]. Topic: Clinical - Red Word Triage >> Jun 22, 2024 11:44 AM [name] ORN wrote: `Red Word that prompted transfer to Nurse Triage: Patient states her legs and ankles are swollen really bad. States this all started after she stopped taking Mounjaro . Reason for Disposition  SEVERE leg swelling (e.g., swelling extends above knee, entire leg is swollen, weeping fluid)  Answer Assessment - Initial Assessment Questions 1. ONSET: When did the swelling start? (e.g., minutes, hours, days)     Started around 05/21/2024 2. LOCATION: What part of the leg is swollen?  Are both legs swollen or just one leg?     Bilateral legs, ankles and feet 3. SEVERITY: How bad is the swelling? (e.g., localized; mild, moderate, severe)     moderate 4. REDNESS: Is there redness or signs of infection?     unknown 5. PAIN: Is the swelling painful to touch? If Yes, ask: How painful is it?   (Scale 1-10; mild, moderate or severe)     10/10 6. FEVER: Do you have a fever? If Yes, ask: What is it, how was it measured, and when did it start?      no 7. CAUSE: What do you think is causing the leg swelling?     Possibly when stopped mounjaro  8. MEDICAL HISTORY: Do you have a history of blood clots (e.g., DVT), cancer, heart failure, kidney disease, or liver failure?     no 9. RECURRENT SYMPTOM: Have you had leg swelling before? If Yes, ask: When was the last time? What happened that time?     no 10. OTHER SYMPTOMS: Do you  have any other symptoms? (e.g., chest pain, difficulty breathing)      SOB x week 11. PREGNANCY: Is there any chance you are pregnant? When was your last menstrual period?       Na Previously admitted to hospital for chest pain related chest pain - dx reflux  Protocols used: Leg Swelling and Edema-A-AH

## 2024-06-22 NOTE — Telephone Encounter (Signed)
 Pt has an appt today 06/22/24.

## 2024-06-22 NOTE — Patient Instructions (Signed)
 Please follow the instructions as discussed at today's plan: --Restart your Mounjaro  2.5 mg weekly --We changed your atorvastatin  dose today: Start taking atorvastatin  80 mg from today daily --We changed your Lasix  dose today.  Start taking 60 mg Lasix  total daily --Prescribed nitroglycerin  for your chest pain.  Take as needed --You can take Tylenol  for your pain as well.  Avoid ibuprofen  as it will affect your kidneys. --We have placed a referral with our dietitian Arland.  She will be contacting you soon --We will place a cardiology referral.  They will be contacting you. --We will see you in 1 month

## 2024-06-22 NOTE — Progress Notes (Signed)
 CC: Follow-up  HPI:  [name] is a 53 y.o. female living with a history stated below and presents today for follow-up.   Pt still complains about chest pain that radiates to her left shoulder pain which is similar to the one she was worked up in the hospital for from July 15-17th. Her CP is only present at night. She also mentioned being discharged on a new Bipap machine and that her CP improves after she uses the machine everyday.  She said she has been eating a lot of food since her mounjaro  was stopped during her hospital admission and worries she might gain back all the weight back. Pt endorses nausea but no vomiting.  Please see problem based assessment and plan for additional details.  Past Medical History:  Diagnosis Date   Anxiety    Asthma    COPD (chronic obstructive pulmonary disease) (HCC)    CVA (cerebral vascular accident) (HCC)    Depression    Heart failure with preserved ejection fraction (HCC)    Hypertension    Major depression    Obesity hypoventilation syndrome (HCC)    Polysubstance abuse (HCC)    PTSD (post-traumatic stress disorder)     Current Outpatient Medications on File Prior to Visit  Medication Sig Dispense Refill   acetaminophen  (TYLENOL ) 325 MG tablet Take 650 mg by mouth 2 (two) times daily as needed for fever, headache or moderate pain.     albuterol  (VENTOLIN  HFA) 108 (90 Base) MCG/ACT inhaler Inhale 2 puffs into the lungs every 4 (four) hours as needed for wheezing or shortness of breath. 36 g 0   amLODipine  (NORVASC ) 5 MG tablet Take 1 tablet (5 mg total) by mouth daily. 30 tablet 0   aspirin  EC 81 MG tablet Take 1 tablet (81 mg total) by mouth daily. Swallow whole. 90 tablet 3   atorvastatin  (LIPITOR) 40 MG tablet Take 1 tablet (40 mg total) by mouth daily. 90 tablet 3   conjugated estrogens  (PREMARIN ) vaginal cream Apply one applicator INTRAVAGINALLY twice a week 42.5 g 12   cyclobenzaprine  (FLEXERIL ) 10 MG tablet Take  1 tablet (10 mg total) by mouth at bedtime. 30 tablet 0   diclofenac  Sodium (VOLTAREN ) 1 % GEL Apply 1 Application topically 4 (four) times daily as needed (pain). 400 g 3   DULoxetine  (CYMBALTA ) 30 MG capsule Take 1 capsule (30 mg total) by mouth daily. 90 capsule 3   empagliflozin  (JARDIANCE ) 10 MG TABS tablet Take 1 tablet (10 mg total) by mouth daily before breakfast. 90 tablet 3   Ferrous Sulfate (IRON PO) Take 1 tablet by mouth daily.     fluticasone  (FLONASE ) 50 MCG/ACT nasal spray PLACE 2 SPRAYS INTO BOTH NOSTRILS DAILY. (Patient taking differently: Place 2 sprays into both nostrils daily as needed for allergies.) 16 g 0   furosemide  (LASIX ) 40 MG tablet Take 1 tablet (40 mg total) by mouth daily. 90 tablet 3   hydrOXYzine  (ATARAX ) 50 MG tablet Take 1 tablet (50 mg total) by mouth every 6 (six) hours as needed for anxiety. 30 tablet 0   insulin  degludec (TRESIBA ) 100 UNIT/ML FlexTouch Pen Inject 20 Units into the skin daily. 15 mL 3   loratadine  (CLARITIN  REDITABS) 10 MG dissolvable tablet Place 1 tablet (10 mg total) on the tongue and allow to dissolve daily. 30 tablet 0   magnesium  hydroxide (MILK OF MAGNESIA) 400 MG/5ML suspension Take 30 mLs by mouth daily as needed for mild constipation. 360 mL 3  melatonin 3 MG TABS tablet Take 2 tablets (6 mg total) by mouth at bedtime. 60 tablet 0   metoCLOPramide  (REGLAN ) 5 MG tablet Take 1 tablet (5 mg total) by mouth 4 (four) times daily -  before meals and at bedtime. 120 tablet 0   mometasone -formoterol  (DULERA ) 100-5 MCG/ACT AERO Inhale 2 puffs into the lungs 2 (two) times daily. 13 g 11   montelukast  (SINGULAIR ) 10 MG tablet Take 1 tablet (10 mg total) by mouth at bedtime. 30 tablet 11   Multiple Vitamin (MULTIVITAMIN WITH MINERALS) TABS tablet Take 1 tablet by mouth daily. (Patient not taking: Reported on 05/18/2024) 30 tablet 0   nicotine  (NICODERM CQ  - DOSED IN MG/24 HOURS) 14 mg/24hr patch Place 1 patch (14 mg total) onto the skin daily. 28  patch 0   olmesartan  (BENICAR ) 20 MG tablet Take 1 tablet (20 mg total) by mouth daily. 90 tablet 3   pantoprazole  (PROTONIX ) 40 MG tablet Take 1 tablet (40 mg total) by mouth 2 (two) times daily. 60 tablet 0   polyethylene glycol (MIRALAX  / GLYCOLAX ) 17 g packet Mix 1 packet (17 g) in beverage and take by mouth daily. 14 each 0   Polyvinyl Alcohol -Povidone (REFRESH OP) Place 1 drop into both eyes 2 (two) times daily as needed (dryness).     pregabalin  (LYRICA ) 50 MG capsule Take 1 capsule (50 mg total) by mouth 3 (three) times daily. 90 capsule 2   senna-docusate (SENOKOT-S) 8.6-50 MG tablet Take 1 tablet by mouth 2 (two) times daily. 60 tablet 0   tiotropium (SPIRIVA ) 18 MCG inhalation capsule Place 1 capsule (18 mcg total) into handihaler and inhale daily. *do not swallow capsule* 90 capsule 3   [Paused] tirzepatide  (MOUNJARO ) 2.5 MG/0.5ML Pen Inject 2.5 mg into the skin once a week. 2 mL 11   No current facility-administered medications on file prior to visit.    Family History  Problem Relation Age of Onset   Cerebral aneurysm Mother    Diabetes Sister    Other Neg Hx     Social History   Socioeconomic History   Marital status: Significant Other    Spouse name: Not on file   Number of children: Not on file   Years of education: Not on file   Highest education level: Not on file  Occupational History   Not on file  Tobacco Use   Smoking status: Every Day    Current packs/day: 0.50    Types: Cigarettes   Smokeless tobacco: Never  Vaping Use   Vaping status: Some Days  Substance and Sexual Activity   Alcohol  use: Yes    Comment: occasional   Drug use: Yes    Types: Cocaine, Marijuana    Comment: pt states no longer uses cocaine   Sexual activity: Not Currently  Other Topics Concern   Not on file  Social History Narrative   ** Merged History Encounter **       Social Drivers of Health   Financial Resource Strain: Low Risk  (11/05/2018)   Received from [facility]   Overall Financial Resource Strain (CARDIA)    Difficulty of Paying Living Expenses: Not hard at all  Food Insecurity: Food Insecurity Present (05/19/2024)   Hunger Vital Sign    Worried About Running Out of Food in the Last Year: Sometimes true    Ran Out of Food in the Last Year: Sometimes true  Transportation Needs: Unmet Transportation Needs (05/19/2024)   PRAPARE -  Administrator, Civil Service (Medical): Yes    Lack of Transportation (Non-Medical): Yes  Physical Activity: Insufficiently Active (11/05/2018)   Received from [facility]   Exercise Vital Sign    On average, how many days per week do you engage in moderate to strenuous exercise (like a brisk walk)?: 2 days    On average, how many minutes do you engage in exercise at this level?: 30 min  Stress: No Stress Concern Present (11/05/2018)   Received from [facility] - Occupational Stress Questionnaire    Feeling of Stress : Not at all  Social Connections: Unknown (03/04/2023)   Received from [facility]   Social Connections    Frequency of Communication with Friends and Family: Not asked    Frequency of Social Gatherings with Friends and Family: Not asked  Intimate Partner Violence: Not At Risk (05/19/2024)   Humiliation, Afraid, Rape, and Kick questionnaire    Fear of Current or Ex-Partner: No    Emotionally Abused: No    Physically Abused: No    Sexually Abused: No    Review of Systems: ROS  Per HPI, assessment, plan Vitals:   06/22/24 1347  BP: 131/87  Pulse: (!) 106  Temp: 98.3 F (36.8 C)  TempSrc: Oral  SpO2: 98%  Weight: 285 lb (129.3 kg)  Height: 5' 1 (1.549 m)    Physical Exam: Physical Exam Constitutional:      Appearance: She is obese.  Cardiovascular:     Rate and Rhythm: Normal rate and regular rhythm.     Heart sounds: Normal heart sounds.  Pulmonary:     Effort: Pulmonary  effort is normal.     Breath sounds: Normal breath sounds.  Abdominal:     Tenderness: There is no abdominal tenderness. There is no guarding.  Musculoskeletal:     Comments: BL LE swelling  Neurological:     Mental Status: She is alert.      Assessment & Plan:     Patient seen with Dr. Shawn  Assessment & Plan Chest pain, unspecified type We believe that patient's chest pain is multifactorial in nature due to her presentation of chest pain and improvement with using the BiPAP machine.  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  She would benefit from a cardiology consult for further cardiac workup involving stress test.  Asked patient to also take nitroglycerin  as needed for chest pain or Tylenol .  Asked patient to avoid taking ibuprofen  as that will affect her kidneys. --Cardiology referral placed but stress test and further cardiac workup --Patient confirmed she will be following up with pulmonology soon --Patient to take nitroglycerin  as needed for chest pain --Patient to take Tylenol  as needed for chest pain --Asked patient to avoid ibuprofen  as it would affect her kidneys --Will see her back in 1 month Type 2 diabetes mellitus without complication, with long-term current use of insulin  [facility]) Patient mentioned, that her blood sugar levels are 200-300 and range whenever she checks them.  She has also been eating a lot after she stopped her Mounjaro .  She is afraid of getting back on her weight.  We do think that patient's chest pain is multifactorial in region and that even though her chest pain was believed to be  due to GERD at the hospital we do think she would benefit from restarting her Mounjaro  due to high blood sugar levels reported at home and for weight management.  She would also benefit from seeing our dietitian  Arland.  Patient's A1c today was 9.2 from 9.1 last month. Pt's echocardiogram on 04/15/2023 which showed ejection fraction of 60-65% and grade 1 diastolic dysfunction, she is currently on lasix  40 mg.  We will change patient's Lasix  dose today from 40 mg to 60 mg daily for symptom control. --Restart Mounjaro  2.5 mg weekly --Referral with Arland placed --Continue taking Jardiance  10 mg daily --Continue taking Tresiba  15 mL --Start taking Lasix  60 mg daily --We will see her back in 1 month Hyperlipidemia, unspecified hyperlipidemia type Lipid panel from 7/14 showed total cholesterol 257 and triglycerides 439. Patient is currently on atorvastatin  40 mg daily.  Due to her elevated lipid levels we will change her atorvastatin  dose to 80 mg daily --Start taking atorvastatin  80 mg daily Dyspareunia in female Patient ran out of her Premarin  vaginal cream.  Represcribed it today. Type 2 diabetes mellitus with complication, with long-term current use of insulin  [facility]) Patient mentioned, that her blood sugar levels are 200-300 and range whenever she checks them.  She has also been eating a lot after she stopped her Mounjaro .  She is afraid of getting back on her weight.  We do think that patient's chest pain is multifactorial in region and that even though her chest pain was believed to be due to GERD at the hospital we do think she would benefit from restarting her Mounjaro  due to high blood sugar levels reported at home and for weight management.  She would also benefit from seeing our dietitian Arland.  Patient's A1c today was 9.2 from 9.1 last month. Pt's echocardiogram on 04/15/2023 which showed ejection fraction of 60-65% and grade 1 diastolic dysfunction, she is currently on lasix  40 mg.  We will change patient's Lasix  dose today from 40 mg to 60 mg daily for symptom control. --Restart Mounjaro  2.5 mg weekly --Referral with Arland placed --Continue taking Jardiance  10 mg daily --Continue taking Tresiba  15  mL --Start taking Lasix  60 mg daily --We will see her back in 1 month       Orders Placed This Encounter  Procedures   Glucose, capillary   POC Hbg A1C     Rebecka Pion, D.O. [facility] Internal Medicine, PGY-1 Date 06/22/2024 Time 2:29 PM

## 2024-06-23 ENCOUNTER — Other Ambulatory Visit (HOSPITAL_COMMUNITY): Payer: Self-pay

## 2024-06-25 ENCOUNTER — Other Ambulatory Visit (HOSPITAL_COMMUNITY): Payer: Self-pay

## 2024-06-28 NOTE — Assessment & Plan Note (Signed)
 We believe that patient's chest pain is multifactorial in nature due to her presentation of chest pain and improvement with using the BiPAP machine.  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  She would benefit from a cardiology consult for further cardiac workup involving stress test.  Asked patient to also take nitroglycerin  as needed for chest pain or Tylenol .  Asked patient to avoid taking ibuprofen  as that will affect her kidneys. --Cardiology referral placed but stress test and further cardiac workup --Patient confirmed she will be following up with pulmonology soon --Patient to take nitroglycerin  as needed for chest pain --Patient to take Tylenol  as needed for chest pain --Asked patient to avoid ibuprofen  as it would affect her kidneys --Will see her back in 1 month

## 2024-06-28 NOTE — Assessment & Plan Note (Signed)
 Patient mentioned, that her blood sugar levels are 200-300 and range whenever she checks them.  She has also been eating a lot after she stopped her Mounjaro .  She is afraid of getting back on her weight.  We do think that patient's chest pain is multifactorial in region and that even though her chest pain was believed to be due to GERD at the hospital we do think she would benefit from restarting her Mounjaro  due to high blood sugar levels reported at home and for weight management.  She would also benefit from seeing our dietitian Arland.  Patient's A1c today was 9.2 from 9.1 last month. Pt's echocardiogram on 04/15/2023 which showed ejection fraction of 60-65% and grade 1 diastolic dysfunction, she is currently on lasix  40 mg.  We will change patient's Lasix  dose today from 40 mg to 60 mg daily for symptom control. --Restart Mounjaro  2.5 mg weekly --Referral with Arland placed --Continue taking Jardiance  10 mg daily --Continue taking Tresiba  15 mL --Start taking Lasix  60 mg daily --We will see her back in 1 month

## 2024-06-28 NOTE — Assessment & Plan Note (Signed)
 Lipid panel from 7/14 showed total cholesterol 257 and triglycerides 439. Patient is currently on atorvastatin  40 mg daily.  Due to her elevated lipid levels we will change her atorvastatin  dose to 80 mg daily --Start taking atorvastatin  80 mg daily

## 2024-06-29 ENCOUNTER — Other Ambulatory Visit (HOSPITAL_COMMUNITY): Payer: Self-pay

## 2024-06-29 ENCOUNTER — Other Ambulatory Visit: Payer: Self-pay | Admitting: Student

## 2024-06-29 DIAGNOSIS — J42 Unspecified chronic bronchitis: Secondary | ICD-10-CM

## 2024-06-29 DIAGNOSIS — R079 Chest pain, unspecified: Secondary | ICD-10-CM

## 2024-06-29 DIAGNOSIS — M545 Low back pain, unspecified: Secondary | ICD-10-CM

## 2024-06-29 DIAGNOSIS — F333 Major depressive disorder, recurrent, severe with psychotic symptoms: Secondary | ICD-10-CM

## 2024-06-29 DIAGNOSIS — G8929 Other chronic pain: Secondary | ICD-10-CM

## 2024-06-29 NOTE — Telephone Encounter (Signed)
 Second request for metoclopramide (Regaln), pantopraxole (Protonix ), cyclobenzaprine  (flexeril ) Request for brioellipta not on profile

## 2024-06-29 NOTE — Telephone Encounter (Signed)
 Copied from CRM #[id number]. Topic: Clinical - Medication Refill >> Jun 29, 2024  4:06 PM Alfonso ORN wrote: Medication: nitroGLYCERIN  (NITROSTAT ) 0.4 MG SL tablet,metoCLOPramide  (REGLAN ) 5 MG tablet,pantoprazole  (PROTONIX ) 40 MG tablet ,cyclobenzaprine  (FLEXERIL ) 10 MG tablet, DULoxetine  (CYMBALTA ) 30 MG capsule , breoellipta 100/25 mg ,montelukast  (SINGULAIR ) 10 MG tablet, Patient stated need the refill for the stool softner/laxative 50-8.6 mg tablets(not sure of the name Has the patient contacted their pharmacy? No (Agent: If no, request that the patient contact the pharmacy for the refill. If patient does not wish to contact the pharmacy document the reason why and proceed with request.) (Agent: If yes, when and what did the pharmacy advise?)  This is the patient's preferred pharmacy:  [facility] [place] [address], [address]  [place] [address]  Phone: [phone]  Fax: [phone] Is this the correct pharmacy for this prescription? Yes If no, delete pharmacy and type the correct one.   Has the prescription been filled recently? Yes  Is the patient out of the medication? Yes  Has the patient been seen for an appointment in the last year OR does the patient have an upcoming appointment? Yes  Can we respond through MyChart? No  Agent: Please be advised that Rx refills may take up to 3 business days. We ask that you follow-up with your pharmacy.

## 2024-07-01 ENCOUNTER — Ambulatory Visit (INDEPENDENT_AMBULATORY_CARE_PROVIDER_SITE_OTHER): Payer: MEDICAID | Admitting: Licensed Clinical Social Worker

## 2024-07-01 DIAGNOSIS — F333 Major depressive disorder, recurrent, severe with psychotic symptoms: Secondary | ICD-10-CM

## 2024-07-01 NOTE — BH Specialist Note (Signed)
 Integrated Behavioral Health via Telemedicine Visit  07/01/2024 Cathy Hall [id number]  Number of Integrated Behavioral Health Clinician visits: 2- Second Visit  Session Start time: 1330   Session End time: 1339  Total time in minutes: 9    Referring Provider: PCP Patient/Family location: Home [facility] Provider location: Office All persons participating in visit: [facility] and Patient Types of Service: Telephone visit  I connected with Karen Jaeger Dukes-Enaiho via  Telephone oand verified that I am speaking with the correct person using two identifiers. Discussed confidentiality: Yes   I discussed the limitations of telemedicine and the availability of in person appointments.  Discussed there is a possibility of technology failure and discussed alternative modes of communication if that failure occurs.  I discussed that engaging in this telemedicine visit, they consent to the provision of behavioral healthcare and the services will be billed under their insurance.  Patient and/or legal guardian expressed understanding and consented to Telemedicine visit: Yes   Presenting Concerns: Patient and/or family reports the following symptoms/concerns: Brief discussion held on today. Client discussed upcoming birthday and buying herself some new shows. Client prefers in person appointment. Patient will call agency to schedule. Nothing else discussed in this encounter.  Patient and/or Family's Strengths/Protective Factors: Sense of purpose  Goals Addressed: Patient will:  Reduce symptoms of: depression    Clinical Assessment/Diagnosis  No diagnosis found.    Assessment: Patient currently experiencing Depression.   Patient may benefit from Ongoing OPT.  I discussed the assessment and treatment plan with the patient and/or parent/guardian. They were provided an opportunity to ask questions and all were answered. They agreed with the plan and demonstrated an understanding  of the instructions.   They were advised to call back or seek an in-person evaluation if the symptoms worsen or if the condition fails to improve as anticipated.  Renda Pontes, MSW, LCSW-A She/Her Behavioral Health Clinician [facility]  Internal Medicine Center

## 2024-07-02 ENCOUNTER — Ambulatory Visit: Payer: Self-pay

## 2024-07-02 ENCOUNTER — Other Ambulatory Visit: Payer: Self-pay

## 2024-07-02 ENCOUNTER — Other Ambulatory Visit (HOSPITAL_COMMUNITY): Payer: Self-pay

## 2024-07-02 MED ORDER — PANTOPRAZOLE SODIUM 40 MG PO TBEC
40.0000 mg | DELAYED_RELEASE_TABLET | Freq: Two times a day (BID) | ORAL | 0 refills | Status: DC
  Filled 2024-07-02: qty 60, 30d supply, fill #0

## 2024-07-02 MED ORDER — AMLODIPINE BESYLATE 5 MG PO TABS
5.0000 mg | ORAL_TABLET | Freq: Every day | ORAL | 0 refills | Status: DC
  Filled 2024-07-02: qty 30, 30d supply, fill #0

## 2024-07-02 MED ORDER — CYCLOBENZAPRINE HCL 10 MG PO TABS
10.0000 mg | ORAL_TABLET | Freq: Every day | ORAL | 0 refills | Status: DC
  Filled 2024-07-02: qty 30, 30d supply, fill #0

## 2024-07-07 ENCOUNTER — Other Ambulatory Visit (HOSPITAL_COMMUNITY): Payer: Self-pay

## 2024-07-07 MED ORDER — MONTELUKAST SODIUM 10 MG PO TABS
10.0000 mg | ORAL_TABLET | Freq: Every day | ORAL | 11 refills | Status: DC
  Filled 2024-07-07 – 2024-07-09 (×2): qty 30, 30d supply, fill #0
  Filled 2024-08-11: qty 30, 30d supply, fill #1

## 2024-07-07 MED ORDER — DULOXETINE HCL 30 MG PO CPEP
30.0000 mg | ORAL_CAPSULE | Freq: Every day | ORAL | 3 refills | Status: DC
  Filled 2024-07-07 – 2024-10-02 (×2): qty 90, 90d supply, fill #0

## 2024-07-07 MED ORDER — NITROGLYCERIN 0.4 MG SL SUBL
SUBLINGUAL_TABLET | SUBLINGUAL | 12 refills | Status: DC
  Filled 2024-07-07: qty 25, 30d supply, fill #0

## 2024-07-08 ENCOUNTER — Other Ambulatory Visit (HOSPITAL_COMMUNITY): Payer: Self-pay

## 2024-07-09 ENCOUNTER — Other Ambulatory Visit (HOSPITAL_COMMUNITY): Payer: Self-pay

## 2024-07-09 ENCOUNTER — Telehealth: Payer: Self-pay | Admitting: *Deleted

## 2024-07-09 NOTE — Telephone Encounter (Signed)
 Incoming call from pt (transferred from [place]) States rx is at the two of the wrong [employer]  ([address] and [place]) Pt states she uses [facility] on [address].  [facility] contacted [facility] they will transfer rx's   Pt wanting to pick up flexeril , pantoprazole , and singulair  today-all of which already has refills.   Attempted to call pt back to let her know rx can be picked up from [facility] location.  No answer, unable to leave message.Cathy Harpham Cassady9/4/20252:37 PM

## 2024-07-10 NOTE — Progress Notes (Signed)
 Internal Medicine Clinic Attending  I was physically present during the key portions of the resident provided service and participated in the medical decision making of patient's management care. I reviewed pertinent patient test results.  The assessment, diagnosis, and plan were formulated together and I agree with the documentation in the resident's note.  Shawn Sick, MD

## 2024-07-21 ENCOUNTER — Other Ambulatory Visit: Payer: Self-pay | Admitting: Student

## 2024-07-21 ENCOUNTER — Telehealth: Payer: Self-pay | Admitting: *Deleted

## 2024-07-21 ENCOUNTER — Other Ambulatory Visit (HOSPITAL_COMMUNITY): Payer: Self-pay

## 2024-07-21 MED ORDER — MOUNJARO 5 MG/0.5ML ~~LOC~~ SOAJ
5.0000 mg | SUBCUTANEOUS | 1 refills | Status: DC
  Filled 2024-07-21: qty 2, 28d supply, fill #0
  Filled 2024-08-11: qty 2, 28d supply, fill #1

## 2024-07-21 NOTE — Progress Notes (Signed)
 The patient requested to speak with a nurse regarding a possible increase in her Mounjaro  dose. She is currently on 2.5 mg weekly and has been on this dose since May 2025.  I spoke with the patient about potential side effects associated with dose escalation. At this time, the patient reports no adverse effects.  Based on this, I will increase her Mounjaro  dose to 5 mg weekly.  The patient has been advised to contact the office if she experiences any of the potential side effects we discussed.

## 2024-07-21 NOTE — Telephone Encounter (Signed)
 Copied from CRM [id number]. Topic: Clinical - Prescription Issue >> Jul 21, 2024  1:20 PM Alfonso ORN wrote: Reason for CRM: patient request to speak with a nurse  start to feel hungry on the small dosage  need to go up on the dosages , been on same dosages for awhile on the tirzepatide  (MOUNJARO ) 2.5 MG/0.5ML Pen

## 2024-07-30 ENCOUNTER — Other Ambulatory Visit: Payer: Self-pay

## 2024-07-30 ENCOUNTER — Other Ambulatory Visit: Payer: Self-pay | Admitting: Student

## 2024-07-30 ENCOUNTER — Telehealth: Payer: Self-pay | Admitting: *Deleted

## 2024-07-30 ENCOUNTER — Telehealth: Payer: Self-pay

## 2024-07-30 ENCOUNTER — Ambulatory Visit: Payer: MEDICAID | Admitting: Pulmonary Disease

## 2024-07-30 ENCOUNTER — Other Ambulatory Visit (HOSPITAL_COMMUNITY): Payer: Self-pay

## 2024-07-30 ENCOUNTER — Encounter: Payer: Self-pay | Admitting: Pulmonary Disease

## 2024-07-30 DIAGNOSIS — R079 Chest pain, unspecified: Secondary | ICD-10-CM

## 2024-07-30 DIAGNOSIS — I739 Peripheral vascular disease, unspecified: Secondary | ICD-10-CM

## 2024-07-30 MED ORDER — ASPIRIN 81 MG PO TBEC
81.0000 mg | DELAYED_RELEASE_TABLET | Freq: Every day | ORAL | 3 refills | Status: AC
  Filled 2024-07-30: qty 90, 90d supply, fill #0
  Filled 2024-10-02: qty 90, 90d supply, fill #1

## 2024-07-30 MED ORDER — METOCLOPRAMIDE HCL 5 MG PO TABS
5.0000 mg | ORAL_TABLET | Freq: Three times a day (TID) | ORAL | 0 refills | Status: DC
  Filled 2024-07-30: qty 120, 30d supply, fill #0

## 2024-07-30 MED ORDER — NITROGLYCERIN 0.4 MG SL SUBL
SUBLINGUAL_TABLET | SUBLINGUAL | 12 refills | Status: AC
  Filled 2024-07-30: qty 25, 8d supply, fill #0
  Filled 2024-09-09: qty 25, 8d supply, fill #1

## 2024-07-30 NOTE — Telephone Encounter (Signed)
 I talked to pt. Informed she has an appt on 10/8. Also informed pt ASA, Reglan  and nitroglycerin  were refilled today.

## 2024-07-30 NOTE — Telephone Encounter (Unsigned)
 Copied from CRM [id number]. Topic: Appointments - Appointment Scheduling >> Jul 30, 2024  2:38 PM Corean SAUNDERS wrote: Patient/patient representative is calling to schedule an appointment. Refer to attachments for appointment information. >> Jul 30, 2024  2:52 PM Alfonso ORN wrote: Please contact pt. To let know the status of the medication refills , pt is out of her medications , agent did let pt. Know it may take up to 3 business and to followup with their  pharmacy

## 2024-07-30 NOTE — Telephone Encounter (Unsigned)
 Copied from CRM [id number]. Topic: Clinical - Medication Refill >> Jul 30, 2024  2:30 PM Miquel SAILOR wrote: Medication: aspirin  EC 81 MG tablet nitroGLYCERIN  (NITROSTAT ) 0.4 MG SL tablet metoCLOPramide  (REGLAN ) 5 MG tablet   Has the patient contacted their pharmacy? Yes (Agent: If no, request that the patient contact the pharmacy for the refill. If patient does not wish to contact the pharmacy document the reason why and proceed with request.) (Agent: If yes, when and what did the pharmacy advise?)  This is the patient's preferred pharmacy:  [place] [facility] [address], [address] [place] [address] Phone: [phone] Fax: [phone]  Is this the correct pharmacy for this prescription? Yes If no, delete pharmacy and type the correct one.   Has the prescription been filled recently? Yes  Is the patient out of the medication? Yes  Has the patient been seen for an appointment in the last year OR does the patient have an upcoming appointment? Yes  Can we respond through MyChart? Yes  Agent: Please be advised that Rx refills may take up to 3 business days. We ask that you follow-up with your pharmacy. >> Jul 30, 2024  2:48 PM Alfonso ORN wrote: Pt call on status of the 3 refills and to let provider know she is out of all the medications

## 2024-07-30 NOTE — Telephone Encounter (Signed)
 Copied from CRM [id number]. Topic: Clinical - Medication Refill >> Jul 30, 2024  2:30 PM Miquel SAILOR wrote: Medication: aspirin  EC 81 MG tablet nitroGLYCERIN  (NITROSTAT ) 0.4 MG SL tablet metoCLOPramide  (REGLAN ) 5 MG tablet   Has the patient contacted their pharmacy? Yes (Agent: If no, request that the patient contact the pharmacy for the refill. If patient does not wish to contact the pharmacy document the reason why and proceed with request.) (Agent: If yes, when and what did the pharmacy advise?)  This is the patient's preferred pharmacy:  [place] [facility] [address] [place] [address] Phone: [phone] Fax: [phone]  Is this the correct pharmacy for this prescription? Yes If no, delete pharmacy and type the correct one.   Has the prescription been filled recently? Yes  Is the patient out of the medication? Yes  Has the patient been seen for an appointment in the last year OR does the patient have an upcoming appointment? Yes  Can we respond through MyChart? Yes  Agent: Please be advised that Rx refills may take up to 3 business days. We ask that you follow-up with your pharmacy.

## 2024-07-30 NOTE — Telephone Encounter (Signed)
 Copied from CRM [id number]. Topic: Clinical - Medication Question >> Jul 30, 2024  3:22 PM Diannia H wrote: Reason for CRM: Patient is calling because she is trying to get some bladder leaking pads. She stated that she needed a rx for it. Her pharmacy  [place] - [facility] [address] [place] [address] Phone: [phone] Fax: [phone] Hours: M-F 7:30am-7:00p Could you assist?

## 2024-07-30 NOTE — Telephone Encounter (Signed)
 COPD : RN Telephone Call   Patient ID: Cathy Hall, female    DOB: 1971-05-09  Age: 53 y.o. MRN: [id number]  [name] is a 53 y.o. who was called about COPD management.   Patient reports that they are taking the following medications for COPD: VENTOLIN / DULERA  /SPIRIVA  / SINGULAR   Patient reports they DO  have a prior pulmonologist who they follow: [facility] ( [facility] )   Please document smoking status, how many packs? Ready to quit ?  - Smoking Hx: She currently smokes 7 packs per day.   mMRC (Modified Medical Research Council) Dyspnea Scale   Ask the following questions and please check the box that describes the most.   Choose ONE>   []  Grade 0: I only get breathless with strenuous exercise   []  Grade 1: I get short of breath when hurry on level ground or walking up a slight hill   [x]  Grade 2: On level Ground, I walk slower than people of the same age because of breathlessness or have to stop for breath when walking my own pace   []  Grade 3: I Stop for breath after walking about 100 yards or after a few minutes on level ground   []  Grade 4: I am too breathless to leave the house, or I am breathless when dressing    CAT (COPD Assessment Test)   Please ask each of the following questions and select between 0-5 based on what describes the patient most accurately.  On a scale 0 to 5, do you never cough or cough all the time: 1  2 5  (My chest feels very tight) 5 (When I walk up a hill or one flight of stairs I am very breathless) 5 (I am very limited doing any activities at home  5 (I am not at all confident leaving my home because of my lung condition) 5 (I don't sleep soundly because of my lung condition) On a scale 0 to 5, how would you describe your energy?: 2  Total: 30

## 2024-08-06 ENCOUNTER — Other Ambulatory Visit (HOSPITAL_COMMUNITY): Payer: Self-pay

## 2024-08-06 ENCOUNTER — Ambulatory Visit: Payer: MEDICAID | Admitting: Pulmonary Disease

## 2024-08-06 ENCOUNTER — Encounter: Payer: Self-pay | Admitting: Pulmonary Disease

## 2024-08-06 VITALS — BP 126/80 | HR 102 | Temp 98.3°F | Ht 61.0 in | Wt 282.0 lb

## 2024-08-06 DIAGNOSIS — J449 Chronic obstructive pulmonary disease, unspecified: Secondary | ICD-10-CM

## 2024-08-06 DIAGNOSIS — G8929 Other chronic pain: Secondary | ICD-10-CM

## 2024-08-06 DIAGNOSIS — G4733 Obstructive sleep apnea (adult) (pediatric): Secondary | ICD-10-CM

## 2024-08-06 DIAGNOSIS — K219 Gastro-esophageal reflux disease without esophagitis: Secondary | ICD-10-CM

## 2024-08-06 DIAGNOSIS — J42 Unspecified chronic bronchitis: Secondary | ICD-10-CM

## 2024-08-06 DIAGNOSIS — J45909 Unspecified asthma, uncomplicated: Secondary | ICD-10-CM

## 2024-08-06 DIAGNOSIS — Z794 Long term (current) use of insulin: Secondary | ICD-10-CM

## 2024-08-06 DIAGNOSIS — Z6841 Body Mass Index (BMI) 40.0 and over, adult: Secondary | ICD-10-CM

## 2024-08-06 MED ORDER — MOMETASONE FURO-FORMOTEROL FUM 100-5 MCG/ACT IN AERO
2.0000 | INHALATION_SPRAY | Freq: Two times a day (BID) | RESPIRATORY_TRACT | 11 refills | Status: AC
  Filled 2024-08-06: qty 13, 30d supply, fill #0
  Filled 2024-10-02: qty 13, 30d supply, fill #1

## 2024-08-06 NOTE — Patient Instructions (Signed)
  VISIT SUMMARY: You visited us  today to discuss your ongoing chest pain, breathing difficulties, and other related health issues. We reviewed your current medications and made some adjustments to help manage your symptoms better.  YOUR PLAN: CHRONIC OBSTRUCTIVE PULMONARY DISEASE (COPD) AND ASTHMA: You have COPD and asthma, which are being managed with your current inhalers. Your breathing has improved with weight loss. -We will order a lung function test to better understand your condition. -Your Dulera  inhaler will be refilled. -Continue using Spiriva  and montelukast  as prescribed. -Keep working on losing weight to further improve your breathing.  OBSTRUCTIVE SLEEP APNEA AND OBESITY HYPOVENTILATION SYNDROME: You are using a digital BiPAP machine for sleep apnea and obesity hypoventilation syndrome. -We will obtain a download from your machine to verify its type and settings.  OBESITY: You have lost over 30 pounds, which has helped improve your breathing. -Continue your weight loss efforts.  GASTROESOPHAGEAL REFLUX DISEASE DUE TO GLP-1 AGONIST: You have severe heartburn and chest pain due to your medication, which is worse when lying down. -Continue taking Mounjaro  for weight loss and follow dietary changes to manage heartburn.

## 2024-08-06 NOTE — Progress Notes (Signed)
 Bernese Doffing    [id number]    Aug 01, 1971  Primary Care Physician:Amoako, Drue, MD  Referring Physician: Renne Drue, MD [address] [place], [address] [place],  [place] [address]  Chief complaint: Hospital follow-up for respiratory failure,  HPI: 53 y.o. who has history of diastolic CHF, polysubstance abuse, morbid obesity, COPD, CVA, HTN, DM 2 peripheral neuropathy.  Admitted to [facility] from 6/5 to 04/21/2023 with hypoxic respiratory failure in setting of parainfluenza pneumonia, acute on chronic diastolic heart failure, COPD exacerbation.  No evidence of PE on CT angiogram.  PCCM was consulted for hypercapnic respiratory failure requiring BiPAP.  Also treated with aggressive diuresis, Solu-Medrol , bronchodilators and discharged on BiPAP.  Prior to this admission she was hospitalized with exacerbation of HFpEF in April 2024 in [place] .  She was discharged on Lasix  but could not take medications due to lack of insurance and cost of medication.  Interim history: Discussed the use of AI scribe software for clinical note transcription with the patient, who gave verbal consent to proceed.  History of Present Illness Wenona Mayville Arras is a 53 year old female with COPD and asthma who presents with atypical chest pain and breathing difficulties.  Atypical chest pain - Chest pain present since July.  Hospitalized in July to get this evaluated. - Initial pain resembled myocardial infarction but attributed to severe heartburn from GLP-1 agonist medications - Pain persists, especially when supine - Requires sleeping upright to alleviate symptoms - Currently on Mounjaro  5 mg, which has reduced symptom severity with dietary adjustments  Dyspnea and respiratory symptoms - Chronic obstructive pulmonary disease (COPD) and asthma with ongoing breathing difficulties - Improved breathing following weight loss of over 30 pounds - Nocturnal use of a new  digital BiPAP machine providing oxygen and automatic adjustments, replacing a standard BiPAP previously used for sleep apnea - Current respiratory medications include Dulera , Spiriva , and Singulair    Relevant pulmonary history Pets: Dog Occupation: Was previously Education administrator Exposures: No mold, hot tub, Jacuzzi.  No feather pillows or comforters Smoking history: Smoked half pack per day for 19 years.  Continues to smoke Travel history: [place]  Relevant family history: Dad had lung cancer.  Sister had asthma, COPD  Outpatient Encounter Medications as of 08/06/2024  Medication Sig   acetaminophen  (TYLENOL ) 325 MG tablet Take 650 mg by mouth 2 (two) times daily as needed for fever, headache or moderate pain.   albuterol  (VENTOLIN  HFA) 108 (90 Base) MCG/ACT inhaler Inhale 2 puffs into the lungs every 4 (four) hours as needed for wheezing or shortness of breath.   amLODipine  (NORVASC ) 5 MG tablet Take 1 tablet (5 mg total) by mouth daily.   aspirin  EC 81 MG tablet Take 1 tablet (81 mg total) by mouth daily. Swallow whole.   atorvastatin  (LIPITOR) 80 MG tablet Take 1 tablet (80 mg total) by mouth daily.   conjugated estrogens  (PREMARIN ) vaginal cream Apply one applicator INTRAVAGINALLY twice a week   cyclobenzaprine  (FLEXERIL ) 10 MG tablet Take 1 tablet (10 mg total) by mouth at bedtime.   diclofenac  Sodium (VOLTAREN ) 1 % GEL Apply 1 Application topically 4 (four) times daily as needed (pain).   DULoxetine  (CYMBALTA ) 30 MG capsule Take 1 capsule (30 mg total) by mouth daily.   empagliflozin  (JARDIANCE ) 10 MG TABS tablet Take 1 tablet (10 mg total) by mouth daily before breakfast.   Ferrous Sulfate (IRON PO) Take 1 tablet by mouth daily.   furosemide  (  LASIX ) 20 MG tablet Take 1 tablet (20 mg total) by mouth in the morning, at noon, and at bedtime.   hydrOXYzine  (ATARAX ) 50 MG tablet Take 1 tablet (50 mg total) by mouth every 6 (six) hours as needed for anxiety.   insulin   degludec (TRESIBA ) 100 UNIT/ML FlexTouch Pen Inject 20 Units into the skin daily.   magnesium  hydroxide (MILK OF MAGNESIA) 400 MG/5ML suspension Take 30 mLs by mouth daily as needed for mild constipation.   melatonin 3 MG TABS tablet Take 2 tablets (6 mg total) by mouth at bedtime.   mometasone -formoterol  (DULERA ) 100-5 MCG/ACT AERO Inhale 2 puffs into the lungs 2 (two) times daily.   montelukast  (SINGULAIR ) 10 MG tablet Take 1 tablet (10 mg total) by mouth at bedtime.   nitroGLYCERIN  (NITROSTAT ) 0.4 MG SL tablet Dissolve 1 tablet under the tongue every 5 minutes for up to 3 doses as needed for chest pain.   olmesartan  (BENICAR ) 20 MG tablet Take 1 tablet (20 mg total) by mouth daily.   pantoprazole  (PROTONIX ) 40 MG tablet Take 1 tablet (40 mg total) by mouth 2 (two) times daily.   polyethylene glycol (MIRALAX  / GLYCOLAX ) 17 g packet Mix 1 packet (17 g) in beverage and take by mouth daily.   Polyvinyl Alcohol -Povidone (REFRESH OP) Place 1 drop into both eyes 2 (two) times daily as needed (dryness).   senna-docusate (SENOKOT-S) 8.6-50 MG tablet Take 1 tablet by mouth 2 (two) times daily.   tiotropium (SPIRIVA ) 18 MCG inhalation capsule Place 1 capsule (18 mcg total) into handihaler and inhale daily. *do not swallow capsule*   tirzepatide  (MOUNJARO ) 5 MG/0.5ML Pen Inject 5 mg into the skin once a week.   fluticasone  (FLONASE ) 50 MCG/ACT nasal spray PLACE 2 SPRAYS INTO BOTH NOSTRILS DAILY. (Patient taking differently: Place 2 sprays into both nostrils daily as needed for allergies.)   loratadine  (CLARITIN  REDITABS) 10 MG dissolvable tablet Place 1 tablet (10 mg total) on the tongue and allow to dissolve daily. (Patient not taking: Reported on 08/06/2024)   metoCLOPramide  (REGLAN ) 5 MG tablet Take 1 tablet (5 mg total) by mouth 4 (four) times daily -  before meals and at bedtime.   Multiple Vitamin (MULTIVITAMIN WITH MINERALS) TABS tablet Take 1 tablet by mouth daily. (Patient not taking: Reported on  08/06/2024)   nicotine  (NICODERM CQ  - DOSED IN MG/24 HOURS) 14 mg/24hr patch Place 1 patch (14 mg total) onto the skin daily. (Patient not taking: Reported on 08/06/2024)   pregabalin  (LYRICA ) 50 MG capsule Take 1 capsule (50 mg total) by mouth 3 (three) times daily.   No facility-administered encounter medications on file as of 08/06/2024.   Vitals:   08/06/24 0959  BP: 126/80  Pulse: (!) 102  Temp: 98.3 F (36.8 C)  Height: 5' 1 (1.549 m)  Weight: 282 lb (127.9 kg)  SpO2: 95%  TempSrc: Oral  BMI (Calculated): 53.31     Physical Exam GEN: No acute distress CV: Regular rate and rhythm no murmurs LUNGS: Clear to auscultation bilaterally normal respiratory effort SKIN JOINTS: Warm and dry no rash    Data Reviewed: Imaging: CTA 04/11/2023 with no pulmonary embolism, patchy groundglass opacities.  CTA 05/17/2024-no pulmonary embolism.  Minimal emphysema.  Lungs are clear.  Small hiatal hernia with marked distention of stomach  I had reviewed the images personally.  PFTs:  Labs:  Sleep: PSG 06/26/2023 - Severe obstructive sleep apnea occurred during this study (AHI = 43.3/h). Unfortunately study night could not be split due  to the lack of enough sleep time - Moderate oxygen desaturation was noted during this study (Min O2 = 79.0%). - The patient snored with loud snoring volume. - No cardiac abnormalities were noted during this study. - Clinically significant periodic limb movements did not occur during sleep. No significant associated arousals.  Assessment & Plan Chronic obstructive pulmonary disease and asthma COPD and asthma managed with inhalers including Dulera , Spiriva , and montelukast . Lung function tests are pending. Weight loss has improved respiratory symptoms. - Order lung function test - Refill Dulera  inhaler - Advise continuation of Spiriva  and montelukast  - Encourage weight loss to improve respiratory function  Obstructive sleep apnea and obesity hypoventilation  syndrome OSA and OHS managed with a digital noninvasive ventilator, likely a BiPAP with auto-adjusting features.  Details are not clear on exactly what machine she has but looks like a home ventilator.   - Obtain download from [employer] to verify machine type and settings.  She was originally scheduled for a BiPAP titration but may not need it if she is already on auto titrating home ventilator.  Obesity Obesity with weight over 300 pounds. Over 30 pounds of weight loss achieved, improving respiratory symptoms. - Continue weight loss efforts  Gastroesophageal reflux disease due to GLP-1 agonist Severe heartburn due to GLP-1 agonist Mounjaro , with atypical chest pain and esophageal discomfort, worsened by lying down. Symptoms improved with dietary changes but persist. Improvement expected with medication adjustment as gastric emptying improves. - Continue Mounjaro  for weight loss   Plan/Recommendations: Continue inhalers, Singulair  Obtain NIV report from [employer] PFTs.  Aston Lieske MD [facility] 08/06/2024, 10:16 AM  CC: Renne Homans, MD

## 2024-08-11 ENCOUNTER — Other Ambulatory Visit: Payer: Self-pay | Admitting: Student

## 2024-08-11 ENCOUNTER — Other Ambulatory Visit (HOSPITAL_COMMUNITY): Payer: Self-pay

## 2024-08-11 ENCOUNTER — Telehealth: Payer: Self-pay | Admitting: Student

## 2024-08-11 ENCOUNTER — Telehealth: Payer: MEDICAID | Admitting: Student

## 2024-08-11 DIAGNOSIS — J42 Unspecified chronic bronchitis: Secondary | ICD-10-CM

## 2024-08-11 DIAGNOSIS — R079 Chest pain, unspecified: Secondary | ICD-10-CM

## 2024-08-11 NOTE — Telephone Encounter (Signed)
 Attempted to contact patient because she has 2 appts for today and tomorrow.  Wanted to see which one she wanted to keep, but no answer and no option to leave voicemail message.

## 2024-08-11 NOTE — Progress Notes (Signed)
 Attempted to visit.  No answer.  Patient does have scheduled appointment with me tomorrow.  Will follow-up at next visit.

## 2024-08-11 NOTE — Telephone Encounter (Signed)
 Copied from CRM [id number]. Topic: Clinical - Medication Refill >> Aug 11, 2024 11:38 AM Miquel SAILOR wrote: Medication: amLODipine  (NORVASC ) 5 MG tablet furosemide  (LASIX ) 20 MG tablet montelukast  (SINGULAIR ) 10 MG tablet  Has the patient contacted their pharmacy? Yes (Agent: If no, request that the patient contact the pharmacy for the refill. If patient does not wish to contact the pharmacy document the reason why and proceed with request.) (Agent: If yes, when and what did the pharmacy advise?)  This is the patient's preferred pharmacy:  [place] - [facility] [address] [place] [address] Phone: [phone] Fax: [phone]  Is this the correct pharmacy for this prescription? Yes If no, delete pharmacy and type the correct one.   Has the prescription been filled recently? Yes  Is the patient out of the medication? Yes  Has the patient been seen for an appointment in the last year OR does the patient have an upcoming appointment? Yes  Can we respond through MyChart? Yes  Agent: Please be advised that Rx refills may take up to 3 business days. We ask that you follow-up with your pharmacy.

## 2024-08-11 NOTE — Telephone Encounter (Signed)
 Copied from CRM [id number]. Topic: Clinical - Medication Refill >> Aug 11, 2024 11:38 AM Miquel SAILOR wrote: Medication: amLODipine  (NORVASC ) 5 MG tablet furosemide  (LASIX ) 20 MG tablet montelukast  (SINGULAIR ) 10 MG tablet  Has the patient contacted their pharmacy? Yes (Agent: If no, request that the patient contact the pharmacy for the refill. If patient does not wish to contact the pharmacy document the reason why and proceed with request.) (Agent: If yes, when and what did the pharmacy advise?)  This is the patient's preferred pharmacy:  [place] - [facility] [address], [address] [place] [address] Phone: [phone] Fax: [phone]  Is this the correct pharmacy for this prescription? Yes If no, delete pharmacy and type the correct one.   Has the prescription been filled recently? Yes  Is the patient out of the medication? Yes  Has the patient been seen for an appointment in the last year OR does the patient have an upcoming appointment? Yes  Can we respond through MyChart? Yes  Agent: Please be advised that Rx refills may take up to 3 business days. We ask that you follow-up with your pharmacy. >> Aug 11, 2024 11:41 AM Miquel SAILOR wrote: tirzepatide  (MOUNJARO ) 5 MG/0.5ML Pen   Requesting refill on this medication also

## 2024-08-12 ENCOUNTER — Encounter: Payer: Self-pay | Admitting: Student

## 2024-08-12 ENCOUNTER — Ambulatory Visit (INDEPENDENT_AMBULATORY_CARE_PROVIDER_SITE_OTHER): Payer: MEDICAID | Admitting: Student

## 2024-08-12 ENCOUNTER — Other Ambulatory Visit (HOSPITAL_COMMUNITY): Payer: Self-pay

## 2024-08-12 VITALS — BP 125/65 | HR 98 | Temp 98.4°F | Ht 61.0 in | Wt 283.0 lb

## 2024-08-12 DIAGNOSIS — Z1211 Encounter for screening for malignant neoplasm of colon: Secondary | ICD-10-CM

## 2024-08-12 DIAGNOSIS — Z79899 Other long term (current) drug therapy: Secondary | ICD-10-CM | POA: Diagnosis not present

## 2024-08-12 DIAGNOSIS — E6609 Other obesity due to excess calories: Secondary | ICD-10-CM | POA: Diagnosis not present

## 2024-08-12 DIAGNOSIS — R3981 Functional urinary incontinence: Secondary | ICD-10-CM | POA: Diagnosis not present

## 2024-08-12 DIAGNOSIS — Z6841 Body Mass Index (BMI) 40.0 and over, adult: Secondary | ICD-10-CM

## 2024-08-12 DIAGNOSIS — Z7985 Long-term (current) use of injectable non-insulin antidiabetic drugs: Secondary | ICD-10-CM

## 2024-08-12 DIAGNOSIS — Z23 Encounter for immunization: Secondary | ICD-10-CM

## 2024-08-12 MED ORDER — AMLODIPINE BESYLATE 5 MG PO TABS
5.0000 mg | ORAL_TABLET | Freq: Every day | ORAL | 3 refills | Status: AC
  Filled 2024-08-12 – 2024-09-09 (×2): qty 90, 90d supply, fill #0

## 2024-08-12 NOTE — Progress Notes (Signed)
 CC: Incontinence of bladder  HPI:  [name] is a 53 y.o. female with past medical history HFpEF, PAD, hypertension, COPD, type 2 diabetes on insulin  who presents for appointment regarding incontinence supplies.  Please see assessment and plan for full HPI.  Medications:  Back pain: Flexeril  10 mg nightly, Voltaren  gel, Cymbalta  30 mg daily, Tylenol  650 mg twice daily HFpEF: Lasix  20 mg 3 times daily PAD: Nitroglycerin  tablet, aspirin  81 mg daily COPD: Albuterol  2 puffs every 4 hours as needed, Dulera  2 puffs twice daily, Spiriva  Allergic rhinitis: Claritin  10 mg daily, Singulair  10 mg daily Tobacco use: Nicotine  patch Diabetes: Lyrica  50 mg 3 times daily, Jardiance  10 mg daily, Tresiba  20 units daily Dyspareunia: Vaginal cream Hypertension: Olmesartan  20 mg daily, amlodipine  5 mg daily, tirzepatide  5 mg weekly Hyperlipidemia: Lipitor 80 mg daily Anxiety: Hydroxyzine  50 mg every 6 hours as needed Iron deficiency: Ferrous sulfate 1 tablet daily GERD: Magnesium  hydroxide 30 mL daily as needed, Protonix  40 mg twice daily Insomnia: Melatonin 6 mg nightly Gastroparesis: Reglan  5 mg 3 times daily Constipation: MiraLAX  17 g daily, Senokot 1 tablet twice daily Refresh eyedrops Multivitamin  Past Medical History:  Diagnosis Date   Anxiety    Asthma    COPD (chronic obstructive pulmonary disease) (HCC)    CVA (cerebral vascular accident) (HCC)    Depression    Heart failure with preserved ejection fraction (HCC)    Hypertension    Major depression    Obesity hypoventilation syndrome (HCC)    Polysubstance abuse (HCC)    PTSD (post-traumatic stress disorder)      Current Outpatient Medications:    acetaminophen  (TYLENOL ) 325 MG tablet, Take 650 mg by mouth 2 (two) times daily as needed for fever, headache or moderate pain., Disp: , Rfl:    albuterol  (VENTOLIN  HFA) 108 (90 Base) MCG/ACT inhaler, Inhale 2 puffs into the lungs every 4 (four) hours as needed for  wheezing or shortness of breath., Disp: 36 g, Rfl: 0   amLODipine  (NORVASC ) 5 MG tablet, Take 1 tablet (5 mg total) by mouth daily., Disp: 90 tablet, Rfl: 3   aspirin  EC 81 MG tablet, Take 1 tablet (81 mg total) by mouth daily. Swallow whole., Disp: 90 tablet, Rfl: 3   atorvastatin  (LIPITOR) 80 MG tablet, Take 1 tablet (80 mg total) by mouth daily., Disp: 90 tablet, Rfl: 3   conjugated estrogens  (PREMARIN ) vaginal cream, Apply one applicator INTRAVAGINALLY twice a week, Disp: 30 g, Rfl: 12   cyclobenzaprine  (FLEXERIL ) 10 MG tablet, Take 1 tablet (10 mg total) by mouth at bedtime., Disp: 30 tablet, Rfl: 0   diclofenac  Sodium (VOLTAREN ) 1 % GEL, Apply 1 Application topically 4 (four) times daily as needed (pain)., Disp: 400 g, Rfl: 3   DULoxetine  (CYMBALTA ) 30 MG capsule, Take 1 capsule (30 mg total) by mouth daily., Disp: 90 capsule, Rfl: 3   empagliflozin  (JARDIANCE ) 10 MG TABS tablet, Take 1 tablet (10 mg total) by mouth daily before breakfast., Disp: 90 tablet, Rfl: 3   Ferrous Sulfate (IRON PO), Take 1 tablet by mouth daily., Disp: , Rfl:    furosemide  (LASIX ) 20 MG tablet, Take 1 tablet (20 mg total) by mouth in the morning, at noon, and at bedtime., Disp: 90 tablet, Rfl: 11   hydrOXYzine  (ATARAX ) 50 MG tablet, Take 1 tablet (50 mg total) by mouth every 6 (six) hours as needed for anxiety., Disp: 30 tablet, Rfl: 0   insulin  degludec (TRESIBA ) 100 UNIT/ML FlexTouch Pen, Inject  20 Units into the skin daily., Disp: 15 mL, Rfl: 3   loratadine  (CLARITIN  REDITABS) 10 MG dissolvable tablet, Place 1 tablet (10 mg total) on the tongue and allow to dissolve daily. (Patient not taking: Reported on 08/06/2024), Disp: 30 tablet, Rfl: 0   magnesium  hydroxide (MILK OF MAGNESIA) 400 MG/5ML suspension, Take 30 mLs by mouth daily as needed for mild constipation., Disp: 360 mL, Rfl: 3   melatonin 3 MG TABS tablet, Take 2 tablets (6 mg total) by mouth at bedtime., Disp: 60 tablet, Rfl: 0   metoCLOPramide  (REGLAN ) 5  MG tablet, Take 1 tablet (5 mg total) by mouth 4 (four) times daily -  before meals and at bedtime., Disp: 120 tablet, Rfl: 0   mometasone -formoterol  (DULERA ) 100-5 MCG/ACT AERO, Inhale 2 puffs into the lungs 2 (two) times daily., Disp: 13 g, Rfl: 11   mometasone -formoterol  (DULERA ) 100-5 MCG/ACT AERO, Inhale 2 puffs into the lungs in the morning and at bedtime., Disp: 13 g, Rfl: 11   montelukast  (SINGULAIR ) 10 MG tablet, Take 1 tablet (10 mg total) by mouth at bedtime., Disp: 30 tablet, Rfl: 11   Multiple Vitamin (MULTIVITAMIN WITH MINERALS) TABS tablet, Take 1 tablet by mouth daily. (Patient not taking: Reported on 08/06/2024), Disp: 30 tablet, Rfl: 0   nicotine  (NICODERM CQ  - DOSED IN MG/24 HOURS) 14 mg/24hr patch, Place 1 patch (14 mg total) onto the skin daily. (Patient not taking: Reported on 08/06/2024), Disp: 28 patch, Rfl: 0   nitroGLYCERIN  (NITROSTAT ) 0.4 MG SL tablet, Dissolve 1 tablet under the tongue every 5 minutes for up to 3 doses as needed for chest pain., Disp: 25 tablet, Rfl: 12   olmesartan  (BENICAR ) 20 MG tablet, Take 1 tablet (20 mg total) by mouth daily., Disp: 90 tablet, Rfl: 3   pantoprazole  (PROTONIX ) 40 MG tablet, Take 1 tablet (40 mg total) by mouth 2 (two) times daily., Disp: 60 tablet, Rfl: 0   polyethylene glycol (MIRALAX  / GLYCOLAX ) 17 g packet, Mix 1 packet (17 g) in beverage and take by mouth daily., Disp: 14 each, Rfl: 0   Polyvinyl Alcohol -Povidone (REFRESH OP), Place 1 drop into both eyes 2 (two) times daily as needed (dryness)., Disp: , Rfl:    pregabalin  (LYRICA ) 50 MG capsule, Take 1 capsule (50 mg total) by mouth 3 (three) times daily., Disp: 90 capsule, Rfl: 2   senna-docusate (SENOKOT-S) 8.6-50 MG tablet, Take 1 tablet by mouth 2 (two) times daily., Disp: 60 tablet, Rfl: 0   tiotropium (SPIRIVA ) 18 MCG inhalation capsule, Place 1 capsule (18 mcg total) into handihaler and inhale daily. *do not swallow capsule*, Disp: 90 capsule, Rfl: 3   tirzepatide  (MOUNJARO ) 5  MG/0.5ML Pen, Inject 5 mg into the skin once a week., Disp: 2 mL, Rfl: 1  Review of Systems:    GU: Patient endorses urinary incontinence  Physical Exam:  Vitals:   08/12/24 1336 08/12/24 1404  BP: (!) 146/102 125/65  Pulse: 94 98  Temp: 98.4 F (36.9 C)   TempSrc: Oral   SpO2: 95%   Weight: 283 lb (128.4 kg)   Height: 5' 1 (1.549 m)     General: Patient is sitting comfortably in the room  Head: Normocephalic, atraumatic  Cardio: Regular rate and rhythm, no murmurs, rubs or gallops Pulmonary: Clear to ausculation bilaterally with no rales, rhonchi, and crackles   Assessment & Plan:   Assessment & Plan Polypharmacy Patient has polypharmacy.  She is on many medications.  Unable to tell me which medicine  she is on clearly.  Will refer to pharmacy for management. Encounter for immunization Flu vaccine administered today Functional incontinence Patient with past medical history of right knee injury and still needs surgery to repair.  This has limited her ability to walk.  She is currently walking with a walker.  In the same setting, she has developed functional urinary incontinence that she is unable to make it to the bathroom in time.  Does not sound like urge incontinence as there are not any sudden episodes.  She anticipates that she has to go to the bathroom, she just is not able to make it in time.  Does not sound like stress incontinence as this does not happen when she coughs or sneezes.  Will likely need supplies until she gets surgery.  Plan: - Incontinence applies requested - Follow-up on surgery updates Colon cancer screening Patient referred to colonoscopy for colon cancer screening. Obesity due to excess calories with serious comorbidity, unspecified class Patient past medical history of obesity.  BMI 53.  Patient has a right knee injury and surgeon is unable to operate until she is able to lose 50 pounds.  Patient has been trying to eat well and has not lost much  weight.  She is 283 pounds today.  Will refer her to nutrition to counsel her on the proper foods to eat.  Can continue to titrate her Mounjaro  up for diabetes which will help as well.  Plan: - Refer to nutrition - Continue to take Mounjaro  5 mg weekly   Patient discussed with Dr. Francesco Libby Blanch, DO Internal Medicine Resident PGY-3

## 2024-08-12 NOTE — Assessment & Plan Note (Signed)
 Patient past medical history of obesity.  BMI 53.  Patient has a right knee injury and surgeon is unable to operate until she is able to lose 50 pounds.  Patient has been trying to eat well and has not lost much weight.  She is 283 pounds today.  Will refer her to nutrition to counsel her on the proper foods to eat.  Can continue to titrate her Mounjaro  up for diabetes which will help as well.  Plan: - Refer to nutrition - Continue to take Mounjaro  5 mg weekly

## 2024-08-12 NOTE — Telephone Encounter (Signed)
 Medication sent to pharmacy

## 2024-08-12 NOTE — Patient Instructions (Addendum)
 Cathy Hall,Thank you for allowing me to take part in your care today.  Here are your instructions.  1.  I have referred you for incontinence supplies  2.  You have received your flu vaccine today.  If you develop any fevers or chills, please take Tylenol   3.  I have referred you for colonoscopy  4.  I have referred you to a pharmacist.  5.  I have referred you for weight loss counseling and nutrition counseling.   PLEASE BRING YOUR MEDICATIONS TO EVERY APPOINTMENT  Thank you, Dr. Tobie  If you have any other questions please contact the internal medicine clinic at [phone] If it is after hours, please call the  hospital at [phone] and then ask the person who picks up for the resident on call.

## 2024-08-13 ENCOUNTER — Telehealth: Payer: Self-pay | Admitting: *Deleted

## 2024-08-13 NOTE — Progress Notes (Signed)
 Complex Care Management Note Care Guide Note  08/13/2024 Name: Cathy Hall MRN: [id number] DOB: 23-Jun-1971   Complex Care Management Outreach Attempts: An unsuccessful telephone outreach was attempted today to offer the patient information about available complex care management services.  Follow Up Plan:  Additional outreach attempts will be made to offer the patient complex care management information and services.   Encounter Outcome:  No Answer  Harlene Satterfield  [facility]  [facility], [facility] Guide  Direct Dial: [phone]  Fax [phone]

## 2024-08-14 NOTE — Progress Notes (Signed)
 Care Guide Pharmacy Note  08/14/2024 Name: Cathy Hall MRN: [id number] DOB: Apr 09, 1971  Referred By: Renne Homans, Cathy Hall Reason for referral: Complex Care Management (Initial outreach to schedule referral with PharmD)   Cathy Hall is a 53 y.o. year old female who is a primary care patient of Cathy Hall, Prince, Cathy Hall.  Cathy Hall was referred to the pharmacist for assistance related to: polypharmacy  A second unsuccessful telephone outreach was attempted today to contact the patient who was referred to the pharmacy team for assistance with medication management. Additional attempts will be made to contact the patient.  Harlene Satterfield  [facility]  [facility], [facility] Guide  Direct Dial: [phone]  Fax [phone]

## 2024-08-14 NOTE — Progress Notes (Signed)
 Internal Medicine Clinic Attending  Case discussed with the resident at the time of the visit.  We reviewed the resident's history and exam and pertinent patient test results.  I agree with the assessment, diagnosis, and plan of care documented in the resident's note.

## 2024-08-15 ENCOUNTER — Other Ambulatory Visit (HOSPITAL_COMMUNITY): Payer: Self-pay

## 2024-08-15 MED ORDER — AMLODIPINE BESYLATE 5 MG PO TABS
5.0000 mg | ORAL_TABLET | Freq: Every day | ORAL | 0 refills | Status: DC
  Filled 2024-08-15: qty 30, 30d supply, fill #0

## 2024-08-15 MED ORDER — FUROSEMIDE 20 MG PO TABS
20.0000 mg | ORAL_TABLET | Freq: Three times a day (TID) | ORAL | 11 refills | Status: AC
  Filled 2024-08-15 – 2024-09-09 (×2): qty 90, 30d supply, fill #0
  Filled 2024-10-02: qty 90, 30d supply, fill #1
  Filled 2024-11-12: qty 90, 30d supply, fill #2
  Filled 2024-12-09: qty 90, 30d supply, fill #3

## 2024-08-15 MED ORDER — MONTELUKAST SODIUM 10 MG PO TABS
10.0000 mg | ORAL_TABLET | Freq: Every day | ORAL | 11 refills | Status: AC
  Filled 2024-08-15 – 2024-09-09 (×2): qty 30, 30d supply, fill #0
  Filled 2024-10-02: qty 30, 30d supply, fill #1
  Filled 2024-11-12: qty 30, 30d supply, fill #2
  Filled 2024-12-09: qty 30, 30d supply, fill #3

## 2024-08-15 MED ORDER — MOUNJARO 5 MG/0.5ML ~~LOC~~ SOAJ
5.0000 mg | SUBCUTANEOUS | 1 refills | Status: DC
  Filled 2024-08-15 – 2024-09-09 (×2): qty 2, 28d supply, fill #0
  Filled 2024-10-02: qty 2, 28d supply, fill #1

## 2024-08-17 ENCOUNTER — Other Ambulatory Visit (HOSPITAL_COMMUNITY): Payer: Self-pay

## 2024-08-17 ENCOUNTER — Other Ambulatory Visit: Payer: Self-pay | Admitting: Student

## 2024-08-17 DIAGNOSIS — R3981 Functional urinary incontinence: Secondary | ICD-10-CM

## 2024-08-17 NOTE — Progress Notes (Signed)
 Care Guide Pharmacy Note  08/17/2024 Name: Summit Borchardt MRN: [id number] DOB: 08-Oct-1971  Referred By: Renne Homans, MD Reason for referral: Complex Care Management (Initial outreach to schedule referral with Lorain PharmD POD 6 )   Cloa Evet Mcelmurry is a 53 y.o. year old female who is a primary care patient of Amoako, Prince, MD.  Karen Desma Notch was referred to the pharmacist for assistance related to: Polypharmacy  A third unsuccessful telephone outreach was attempted today to contact the patient who was referred to the pharmacy team for assistance with medication management. The Population Health team is pleased to engage with this patient at any time in the future upon receipt of referral and should he/she be interested in assistance from the Population Health team.  Harlene Satterfield  [facility]  [facility], [facility] Guide  Direct Dial: [phone]  Fax [phone]

## 2024-08-17 NOTE — Progress Notes (Signed)
 Care Guide Pharmacy Note  08/17/2024 Name: Cathy Hall MRN: [id number] DOB: [birth year]  Referred By: Renne Homans, MD Reason for referral: Complex Care Management (Initial outreach to schedule referral with Lorain PharmD POD 6 )   Cathy Hall is a 53 y.o. year old female who is a primary care patient of Amoako, Prince, MD.  Cathy Hall was referred to the pharmacist for assistance related to: Polypharmacy   Successful contact was made with the patient to discuss pharmacy services including being ready for the pharmacist to call at least 5 minutes before the scheduled appointment time and to have medication bottles and any blood pressure readings ready for review. The patient agreed to meet with the pharmacist via in office on (date/time). 08/24/24 at 900 AM  Cathy Hall  [facility], [facility] Guide  Direct Dial: [phone]  Fax [phone]

## 2024-08-19 ENCOUNTER — Other Ambulatory Visit (HOSPITAL_COMMUNITY): Payer: Self-pay

## 2024-08-19 ENCOUNTER — Telehealth: Payer: Self-pay | Admitting: *Deleted

## 2024-08-19 ENCOUNTER — Other Ambulatory Visit: Payer: Self-pay | Admitting: Student

## 2024-08-19 NOTE — Telephone Encounter (Unsigned)
 Copied from CRM #[id number]. Topic: Clinical - Medication Refill >> Aug 19, 2024  1:37 PM Debby BROCKS wrote: Medication: pantoprazole  (PROTONIX ) 40 MG tablet  Has the patient contacted their pharmacy? Yes (Agent: If no, request that the patient contact the pharmacy for the refill. If patient does not wish to contact the pharmacy document the reason why and proceed with request.) (Agent: If yes, when and what did the pharmacy advise?)  This is the patient's preferred pharmacy:  [facility] [address] [place] [address] Phone: [phone] Fax: [phone]  Is this the correct pharmacy for this prescription? Yes If no, delete pharmacy and type the correct one.   Has the prescription been filled recently? No  Is the patient out of the medication? Yes  Has the patient been seen for an appointment in the last year OR does the patient have an upcoming appointment? Yes  Can we respond through MyChart? No  Agent: Please be advised that Rx refills may take up to 3 business days. We ask that you follow-up with your pharmacy.

## 2024-08-19 NOTE — Telephone Encounter (Signed)
 Copied from CRM #[id number]. Topic: Clinical - Order For Equipment >> Aug 19, 2024 12:53 PM Brittney F wrote: Reason for CRM:   Patient is calling in in reference to getting an update on her incontinence supplies. Patient was informed on Monday 08/17/2024 that the order was placed incorrectly and would like to know if the order has since been corrected and sent over to supplier.   Patient has only 3-4 leak proof pads left and needs the matter handled urgently.   Callback Number: [id number]

## 2024-08-20 ENCOUNTER — Other Ambulatory Visit (HOSPITAL_COMMUNITY): Payer: Self-pay

## 2024-08-20 MED ORDER — PANTOPRAZOLE SODIUM 40 MG PO TBEC
40.0000 mg | DELAYED_RELEASE_TABLET | Freq: Two times a day (BID) | ORAL | 0 refills | Status: DC
  Filled 2024-08-20 – 2024-09-09 (×2): qty 60, 30d supply, fill #0

## 2024-08-24 ENCOUNTER — Other Ambulatory Visit (HOSPITAL_COMMUNITY): Payer: Self-pay

## 2024-08-24 ENCOUNTER — Ambulatory Visit: Payer: MEDICAID

## 2024-08-24 ENCOUNTER — Telehealth: Payer: Self-pay

## 2024-08-24 NOTE — Telephone Encounter (Signed)
 Attempted to contact patient x2 for scheduled appointment for medication management. VM not set up, unable to leave VM. Will attempt to reschedule in person appt.   Lorain Baseman, PharmD [facility] [phone]

## 2024-08-24 NOTE — Progress Notes (Deleted)
 08/24/2024 Name: Cathy Hall MRN: [id number] DOB: [birth year]  No chief complaint on file.   Cathy Hall is a 53 y.o. year old female who was referred for medication management by their primary care provider, Renne Homans, MD. They presented for a face to face visit today.   They were referred to the pharmacist by their PCP for assistance in managing complex medication management and medication reconciliation. PMH includes HTN, PAD, HFpEF, COPD/asthma, GERD, gastroparesis (possibly related to Mounjaro ), T2DM, BMI > 50, CVA (2022), polysubstance abuse, depression.   Subjective: Patient was last seen by PCP, Libby Blanch, DO, on 08/12/24 for evaluation for incontinent supplies (functional incontinence due to R knee injury). At last visit, patient was unable to report which medications she was taking. She was referred to pharmacy for a medication review. Her BP was controlled at her last visit (125/65 mmHg), but last A1C on 06/22/24 was 9.2%. She is needing to lose 50 lbs to be considered by knee surgery and Mounjaro  has been restarted and titrated up to 5 mg weekly, after it was discontinued with c/f gastroparesis during hospitalization in July 2025.   Today, patient presents in *** good spirits and presents without *** any assistance. ***Patient is accompanied by ***.    Care Team: Primary Care Provider: Amoako, Prince, MD ; Next Scheduled Visit: needs to be scheduled (A1C) ~09/22/24   Medication Access/Adherence  Current Pharmacy:  [facility] [address] [place] [address] Phone: [phone] Fax: [phone]   Patient reports affordability concerns with their medications: {YES/NO:21197} Patient reports access/transportation concerns to their pharmacy: {YES/NO:21197} Patient reports adherence concerns with their medications:  {YES/NO:21197} ***  *** Patient denies adherence with medications,  reports missing *** medications *** times per week, on average.   Diabetes:  Current medications: *** Medications tried in the past: Victoza   Current glucose readings: ***  Date of Download: *** % Time CGM is active: ***% Average Glucose: *** mg/dL Glucose Management Indicator: ***  Glucose Variability: *** (goal <36%) Time in Goal:  - Time in range 70-180: ***% - Time above range: ***% - Time below range: ***% Observed patterns:  Patient {Actions; denies-reports:120008} hypoglycemic s/sx including ***dizziness, shakiness, sweating. Patient {Actions; denies-reports:120008} hyperglycemic symptoms including ***polyuria, polydipsia, polyphagia, nocturia, neuropathy, blurred vision.  Current meal patterns:  - Breakfast: *** - Lunch *** - Supper *** - Snacks *** - Drinks ***  Current physical activity: ***  Current medication access support: ***  Macrovascular and Microvascular Risk Reduction:  Statin? {DM Statin Pharmacy:33491}; ACEi/ARB? {DM ACEi/ARB pharmacy:33492} Last urinary albumin/creatinine ratio:  Lab Results  Component Value Date   MICRALBCREAT <4 02/27/2024   MICRALBCREAT 5 05/13/2023   Last eye exam:   Last foot exam: 08/12/2024 Tobacco Use:  Tobacco Use: High Risk (08/12/2024)   Patient History    Smoking Tobacco Use: Every Day    Smokeless Tobacco Use: Never    Passive Exposure: Not on file   Medication Management:  Current adherence strategy: ***  Patient reports {Good Fair Poor:[phone]} adherence to medications  Patient reports the following barriers to adherence: ***  Recent fill dates:   Back pain: Flexeril  10 mg nightly, Voltaren  gel, Cymbalta  30 mg daily (05/01/24 for 90ds), Tylenol  650 mg twice daily HFpEF: Lasix  20 mg 3 times daily (gap from Aug to Oct 2025) PAD: Nitroglycerin  tablet, aspirin  81 mg daily COPD: Albuterol  2 puffs every 4 hours as needed, Dulera  2 puffs twice daily, Spiriva  (03/26/24 for  30ds) Allergic rhinitis:  Claritin  10 mg daily, Singulair  10 mg daily Tobacco use: Nicotine  patch Diabetes: Lyrica  50 mg 3 times daily, Jardiance  10 mg daily, Tresiba  20 units daily Dyspareunia: Vaginal cream Hypertension: Olmesartan  20 mg daily (05/01/24 for 90ds), amlodipine  5 mg daily (07/09/24 for 30ds), tirzepatide  5 mg weekly Hyperlipidemia: Lipitor 80 mg daily Anxiety: Hydroxyzine  50 mg every 6 hours as needed Iron deficiency: Ferrous sulfate 1 tablet daily GERD: Magnesium  hydroxide 30 mL daily as needed, Protonix  40 mg twice daily (07/09/24 for 30ds) Insomnia: Melatonin 6 mg nightly Gastroparesis: Reglan  5 mg 3 times daily Constipation: MiraLAX  17 g daily, Senokot 1 tablet twice daily Refresh eyedrops Multivitamin  Objective:  BP Readings from Last 3 Encounters:  08/12/24 125/65  08/06/24 126/80  06/22/24 131/87    Lab Results  Component Value Date   HGBA1C 9.2 (A) 06/22/2024   HGBA1C 9.1 (H) 05/18/2024   HGBA1C 8.2 (A) 02/27/2024       Latest Ref Rng & Units 05/21/2024    3:19 AM 05/20/2024    2:43 AM 05/19/2024    4:50 AM  BMP  Glucose 70 - 99 mg/dL 799  848  817   BUN 6 - 20 mg/dL 19  17  18    Creatinine 0.44 - 1.00 mg/dL 8.83  9.04  8.79   Sodium 135 - 145 mmol/L 136  137  136   Potassium 3.5 - 5.1 mmol/L 3.9  4.3  4.6   Chloride 98 - 111 mmol/L 101  99  97   CO2 22 - 32 mmol/L 24  26  26    Calcium  8.9 - 10.3 mg/dL 9.0  9.2  9.8     Lab Results  Component Value Date   CHOL 257 (H) 05/18/2024   HDL 44 05/18/2024   LDLCALC UNABLE TO CALCULATE IF TRIGLYCERIDE OVER 400 mg/dL 92/85/7974   LDLDIRECT 156 (H) 05/18/2024   TRIG 439 (H) 05/18/2024   CHOLHDL 5.8 05/18/2024    Medications Reviewed Today   Medications were not reviewed in this encounter       Assessment/Plan:   Diabetes: - Currently {CHL Controlled/[phone]} with most recent A1C of *** {Desc; above/below:16086} goal {CCM A1c HNJOD:[id number]}. Medication adherence appears ***. Control is suboptimal due to  ***.   - Last UACR *** - Patient denies personal or family history of multiple endocrine neoplasia type 2, medullary thyroid  cancer; personal history of pancreatitis or gallbladder disease.*** - Reviewed long term cardiovascular and renal outcomes of uncontrolled blood sugar - Reviewed goal A1c, goal fasting, and goal 2 hour post prandial glucose - Reviewed hypoglycemia management plan and the rule of 15*** - Reviewed dietary modifications including *** utilizing the healthy plate method, limiting portion size of carbohydrate foods, increasing intake of protein and non-starchy vegetables. Counseled patient to stay hydrated with water  throughout the day. - Reviewed lifestyle modifications including: aiming for 150 minutes of moderate intensity exercise every week. *** - Recommend to ***  - Recommend to check glucose twice daily: fasting and 2-hr PPG ***. Counseled patient to bring glucometer or BG log to every appointment. - Next A1C due ***    Meets financial criteria for *** patient assistance program through ***. Will collaborate with provider, CPhT, and patient to pursue assistance.    Medication Management: - Currently strategy {sufficient/insufficient:26424} to maintain appropriate adherence to prescribed medication regimen - Suggested use of weekly pill box to organize medications - Created list of medication, indication, and administration time. Provided to patient - Discussed collaboration  with local pharmacies for adherence packaging. Reviewed local pharmacies with adherence packaging options. Patient elects to ***   Written patient instructions provided. Patient verbalized understanding of treatment plan.   Follow Up Plan:  Pharmacist *** PCP clinic visit in ***   Lorain Baseman, PharmD [facility] [phone]

## 2024-08-28 ENCOUNTER — Telehealth: Payer: Self-pay

## 2024-08-28 NOTE — Progress Notes (Signed)
 Complex Care Management Care Guide Note  08/28/2024 Name: Wynell Halberg MRN: [id number] DOB: 1970/11/18  Karen Desma Drenning is a 53 y.o. year old female who is a primary care patient of Amoako, Prince, MD and is actively engaged with the care management team. I reached out to [facility] by phone today to assist with re-scheduling  with the Pharmacist.  Follow up plan: Unsuccessful telephone outreach attempt made. A HIPAA compliant phone message was left for the patient providing contact information and requesting a return call.  Leotis Rase [facility], [facility] Guide  Direct Dial: [phone]  Fax [phone]

## 2024-08-31 ENCOUNTER — Other Ambulatory Visit (HOSPITAL_COMMUNITY): Payer: Self-pay

## 2024-08-31 NOTE — Progress Notes (Signed)
 Complex Care Management Care Guide Note  08/31/2024 Name: Cathy Hall MRN: [id number] DOB: 15-Aug-1971  [name] is a 53 y.o. year old female who is a primary care patient of Amoako, Prince, MD and is actively engaged with the care management team. I reached out to [facility] by phone today to assist with re-scheduling  with the Pharmacist.  Follow up plan: Unsuccessful telephone outreach attempt made. A HIPAA compliant phone message was left for the patient providing contact information and requesting a return call.  Leotis Rase [facility], [facility] Guide  Direct Dial: [phone]  Fax [phone]

## 2024-09-01 ENCOUNTER — Telehealth: Payer: Self-pay | Admitting: *Deleted

## 2024-09-01 NOTE — Telephone Encounter (Unsigned)
 Copied from CRM [id number]. Topic: Clinical - Medical Advice >> Sep 01, 2024  3:37 PM Merlynn A wrote: Reason for CRM: Patient called in regarding incontinence supplies. Please contact patient at [phone].

## 2024-09-01 NOTE — Telephone Encounter (Signed)
 Copied from CRM [id number]. Topic: General - Other >> Sep 01, 2024  2:51 PM Chiquita SQUIBB wrote: Reason for CRM: Lauren from [facility] is calling in due to receiving a fax with the patients medical records but a blank form that the provider needs to fill out. Lauren is asking if this form can be filled out both pages and faxed back to them. A good call back number for Tinnie is [phone]

## 2024-09-02 NOTE — Telephone Encounter (Signed)
 Patient is calling because she has not heard back. Transferred to clinic to speak with May.

## 2024-09-09 ENCOUNTER — Telehealth: Payer: Self-pay

## 2024-09-09 ENCOUNTER — Other Ambulatory Visit (HOSPITAL_COMMUNITY): Payer: Self-pay

## 2024-09-09 NOTE — Telephone Encounter (Signed)
 Copied from CRM [id number]. Topic: Clinical - Prescription Issue >> Sep 09, 2024  2:42 PM Mercer PEDLAR wrote: Reason for CRM: Patient is calling regarding her refills for this month. She stated that it will cost her around $16 and she cannot afford it. Patient is requesting a callback to discuss her options for assistance.

## 2024-09-09 NOTE — Telephone Encounter (Signed)
 Return pt's call who stated he car broke down and she had to pay over $800.00 to get it fixed. And now she does not have enough money to pay for her refills. She uses [facility] ; they told her she will need $28.00 to get her meds and pt stated she does not have the money. I will ask Lavern if she can help the pt.

## 2024-09-10 ENCOUNTER — Other Ambulatory Visit (HOSPITAL_COMMUNITY): Payer: Self-pay

## 2024-09-10 NOTE — Telephone Encounter (Signed)
 Call from patient unsure as to whom she needs to call about her co-pays.  Patient was informed that when she goes to the [facility] to pick up her meds she needs to tell them that she is unable to pay her co-pays for her meds and would like to open an account with the Pharmacy

## 2024-09-10 NOTE — Telephone Encounter (Signed)
 Contacted patient regarding rx costs.   Informed patient to let the pharmacy know she is unable to afford copays and they should be able to offer her the option of a charge account. This way she is able to pay what she can now, and cover the balance at a later date.

## 2024-09-14 ENCOUNTER — Telehealth: Payer: Self-pay | Admitting: *Deleted

## 2024-09-14 NOTE — Telephone Encounter (Unsigned)
 Copied from CRM [id number]. Topic: Clinical - Prescription Issue >> Sep 14, 2024 12:03 PM Debby BROCKS wrote: Reason for CRM: Patient states that [facility] cannot process the order for the patient until Dr. Celestina provides the prescription for her. They have attempted to reach out but received no reply. They advised patient to send a message to the Dr.

## 2024-09-17 NOTE — Telephone Encounter (Signed)
 Re-attempted outreach to reschedule. This is the 5th attempt. No more attempts will be made. Will attempt to reconnect with patient if she follows up in person at [facility].   Lorain Baseman, PharmD [facility] [phone]

## 2024-09-23 ENCOUNTER — Telehealth: Payer: Self-pay | Admitting: *Deleted

## 2024-09-23 ENCOUNTER — Other Ambulatory Visit: Payer: Self-pay | Admitting: Student

## 2024-09-23 ENCOUNTER — Other Ambulatory Visit (HOSPITAL_COMMUNITY): Payer: Self-pay

## 2024-09-23 MED ORDER — PANTOPRAZOLE SODIUM 40 MG PO TBEC
40.0000 mg | DELAYED_RELEASE_TABLET | Freq: Two times a day (BID) | ORAL | 1 refills | Status: DC
  Filled 2024-09-23 – 2024-10-02 (×2): qty 60, 30d supply, fill #0

## 2024-09-23 NOTE — Telephone Encounter (Signed)
 Return pt's call - stated she will be leaving to go out of town to see her sister; she will be leaving between 11/28 - 12/1. She wants refills on all her meds, to last 1 to 1.5 months. She uses [facility].

## 2024-09-23 NOTE — Telephone Encounter (Signed)
 Copied from CRM [id number]. Topic: Clinical - Medication Question >> Sep 23, 2024  3:06 PM Cathy Hall wrote: Reason for CRM: Patient will be out of town for 1.5 months starting on 09/14/2024. She does not know the pharmacies that will be around her in [place]  nor does she think she will be able to travel to them once she's there. She would like to know if she can get an advanced refill on ALL of her medications so that she has them prepared for her trip

## 2024-09-23 NOTE — Telephone Encounter (Signed)
 I attempted to call the patient to ask which medications she has run out of and needs refilled. She did not answer, and her voicemail is not set up. If the patient calls back, please have her provide a list of the medications she needs.  Drue Lisa Grow MD 09/23/2024, 3:44 PM

## 2024-09-23 NOTE — Telephone Encounter (Signed)
 I talked to pt - informed pt the doctor wants to know which meds she needs refills; she stated all of them. I asked her to call the pharmacy , since she does have refills on some of her med, to tell the pharmacy she's going out of town and have them fax us  the refill requests. Call transferred to the pharmacy per pt's request.

## 2024-09-24 NOTE — Telephone Encounter (Signed)
 Pt stated she called her pharmacy; has her refills. Informed pt to let us  know if she any other refills needs before she goes to see her sister - stated she will.

## 2024-09-29 ENCOUNTER — Other Ambulatory Visit: Payer: Self-pay | Admitting: Student

## 2024-09-29 ENCOUNTER — Telehealth: Payer: Self-pay | Admitting: Student

## 2024-09-29 ENCOUNTER — Other Ambulatory Visit (HOSPITAL_COMMUNITY): Payer: Self-pay

## 2024-09-29 ENCOUNTER — Other Ambulatory Visit (HOSPITAL_BASED_OUTPATIENT_CLINIC_OR_DEPARTMENT_OTHER): Payer: Self-pay

## 2024-09-29 DIAGNOSIS — E118 Type 2 diabetes mellitus with unspecified complications: Secondary | ICD-10-CM

## 2024-09-29 DIAGNOSIS — R079 Chest pain, unspecified: Secondary | ICD-10-CM

## 2024-09-29 DIAGNOSIS — E1165 Type 2 diabetes mellitus with hyperglycemia: Secondary | ICD-10-CM

## 2024-09-29 DIAGNOSIS — E785 Hyperlipidemia, unspecified: Secondary | ICD-10-CM

## 2024-09-29 DIAGNOSIS — I10 Essential (primary) hypertension: Secondary | ICD-10-CM

## 2024-09-29 DIAGNOSIS — J42 Unspecified chronic bronchitis: Secondary | ICD-10-CM

## 2024-09-29 DIAGNOSIS — F333 Major depressive disorder, recurrent, severe with psychotic symptoms: Secondary | ICD-10-CM

## 2024-09-29 DIAGNOSIS — I739 Peripheral vascular disease, unspecified: Secondary | ICD-10-CM

## 2024-09-29 DIAGNOSIS — M545 Low back pain, unspecified: Secondary | ICD-10-CM

## 2024-09-29 DIAGNOSIS — N941 Unspecified dyspareunia: Secondary | ICD-10-CM

## 2024-09-29 DIAGNOSIS — G8929 Other chronic pain: Secondary | ICD-10-CM

## 2024-09-29 DIAGNOSIS — Z8673 Personal history of transient ischemic attack (TIA), and cerebral infarction without residual deficits: Secondary | ICD-10-CM

## 2024-09-29 DIAGNOSIS — F1910 Other psychoactive substance abuse, uncomplicated: Secondary | ICD-10-CM

## 2024-09-29 NOTE — Telephone Encounter (Signed)
 Copied from CRM #[id number]. Topic: Clinical - Prescription Issue >> Sep 29, 2024  3:54 PM Carrielelia G wrote: Reason for CRM: Patient Dukes is requesting medications to be changed from 30 to 60 days supply/refill   Please advise

## 2024-09-29 NOTE — Telephone Encounter (Signed)
 Copied from CRM #[id number]. Topic: Clinical - Medication Refill >> Sep 29, 2024  3:42 PM Miquel SAILOR wrote: Medication:  acetaminophen  (TYLENOL ) 325 MG tablet  albuterol  (VENTOLIN  HFA) 108 (90 Base) MCG/ACT inhaler  amLODipine  (NORVASC ) 5 MG tablet  amLODipine  (NORVASC ) 5 MG tablet aspirin  EC 81 MG tablet  atorvastatin  (LIPITOR) 80 MG tablet conjugated  estrogens  (PREMARIN ) vaginal cream cyclobenzaprine  (FLEXERIL ) 10 MG tablet diclofenac  Sodium (VOLTAREN ) 1 % GEL  DULoxetine  (CYMBALTA ) 30 MG capsule empagliflozin  (JARDIANCE ) 10 MG TABS tablet  Ferrous Sulfate (IRON PO)  furosemide  (LASIX ) 20 MG tablet hydrOXYzine  (ATARAX ) 50 MG tablet insulin  degludec (TRESIBA ) 100 UNIT/ML FlexTouch Pen  loratadine  (CLARITIN  REDITABS) 10 MG dissolvable tablet magnesium  hydroxide (MILK OF MAGNESIA) 400 MG/5ML suspension melatonin 3 MG TABS tablet  metoCLOPramide  (REGLAN ) 5 MG tablet  mometasone -formoterol  (DULERA ) 100-5 MCG/ACT AERO  mometasone -formoterol  (DULERA ) 100-5 MCG/ACT AERO  montelukast  (SINGULAIR ) 10 MG tablet Multiple Vitamin (MULTIVITAMIN WITH MINERALS) TABS tablet nicotine  (NICODERM CQ  - DOSED IN MG/24 HOURS) 14 mg/24hr patch  nitroGLYCERIN  (NITROSTAT ) 0.4 MG SL tablet  olmesartan  (BENICAR ) 20 MG tablet  pantoprazole  (PROTONIX ) 40 MG tablet polyethylene glycol (MIRALAX  / GLYCOLAX ) 17 g packet Polyvinyl Alcohol -Povidone (REFRESH OP)  pregabalin  (LYRICA ) 50 MG capsule senna-docusate (SENOKOT-S) 8.6-50 MG tablet  tiotropium (SPIRIVA ) 18 MCG inhalation capsule tirzepatide  (MOUNJARO ) 5 MG/0.5ML Pen     Has the patient contacted their pharmacy? Yes (Agent: If no, request that the patient contact the pharmacy for the refill. If patient does not wish to contact the pharmacy document the reason why and proceed with request.) (Agent: If yes, when and what did the pharmacy advise?)  This is the patient's preferred pharmacy:  [facility] [address] [place] [address] Phone: [phone] Fax: [phone]  Is this the correct pharmacy for this prescription? Yes If no, delete pharmacy and type the correct one.   Has the prescription been filled recently? Yes  Is the patient out of the medication? No  Has the patient been seen for an appointment in the last year OR does the patient have an upcoming appointment? Yes  Can we respond through MyChart? Yes  Agent: Please be advised that Rx refills may take up to 3 business days. We ask that you follow-up with your pharmacy.

## 2024-10-02 ENCOUNTER — Other Ambulatory Visit (HOSPITAL_COMMUNITY): Payer: Self-pay

## 2024-10-02 ENCOUNTER — Other Ambulatory Visit: Payer: Self-pay

## 2024-10-05 ENCOUNTER — Other Ambulatory Visit (HOSPITAL_BASED_OUTPATIENT_CLINIC_OR_DEPARTMENT_OTHER): Payer: Self-pay

## 2024-10-05 ENCOUNTER — Other Ambulatory Visit (HOSPITAL_COMMUNITY): Payer: Self-pay

## 2024-10-05 MED ORDER — CYCLOBENZAPRINE HCL 10 MG PO TABS
10.0000 mg | ORAL_TABLET | Freq: Every day | ORAL | 0 refills | Status: AC
  Filled 2024-10-05 – 2024-11-12 (×3): qty 30, 30d supply, fill #0
  Filled 2024-12-09: qty 30, 30d supply, fill #1

## 2024-10-05 MED ORDER — MOMETASONE FURO-FORMOTEROL FUM 100-5 MCG/ACT IN AERO
2.0000 | INHALATION_SPRAY | Freq: Two times a day (BID) | RESPIRATORY_TRACT | 11 refills | Status: AC
  Filled 2024-10-05 – 2024-12-09 (×2): qty 13, 30d supply, fill #0

## 2024-10-05 MED ORDER — PREGABALIN 50 MG PO CAPS
50.0000 mg | ORAL_CAPSULE | Freq: Three times a day (TID) | ORAL | 2 refills | Status: AC
  Filled 2024-10-05: qty 180, 60d supply, fill #0

## 2024-10-05 MED ORDER — OLMESARTAN MEDOXOMIL 20 MG PO TABS
20.0000 mg | ORAL_TABLET | Freq: Every day | ORAL | 3 refills | Status: AC
  Filled 2024-10-05: qty 90, 90d supply, fill #0

## 2024-10-05 MED ORDER — DULOXETINE HCL 30 MG PO CPEP
30.0000 mg | ORAL_CAPSULE | Freq: Every day | ORAL | 3 refills | Status: AC
  Filled 2024-10-05: qty 90, 90d supply, fill #0

## 2024-10-05 MED ORDER — PANTOPRAZOLE SODIUM 40 MG PO TBEC
40.0000 mg | DELAYED_RELEASE_TABLET | Freq: Two times a day (BID) | ORAL | 1 refills | Status: AC
  Filled 2024-11-12: qty 60, 30d supply, fill #0
  Filled 2024-12-09: qty 60, 30d supply, fill #1

## 2024-10-05 MED ORDER — MOUNJARO 5 MG/0.5ML ~~LOC~~ SOAJ
5.0000 mg | SUBCUTANEOUS | 1 refills | Status: AC
  Filled 2024-11-12: qty 2, 28d supply, fill #0
  Filled 2024-12-09: qty 2, 28d supply, fill #1

## 2024-10-05 MED ORDER — METOCLOPRAMIDE HCL 5 MG PO TABS
5.0000 mg | ORAL_TABLET | Freq: Three times a day (TID) | ORAL | 0 refills | Status: AC
  Filled 2024-10-05 – 2024-11-12 (×2): qty 120, 30d supply, fill #0

## 2024-10-05 MED ORDER — AMLODIPINE BESYLATE 5 MG PO TABS
5.0000 mg | ORAL_TABLET | Freq: Every day | ORAL | 0 refills | Status: AC
  Filled 2024-10-05 – 2024-12-09 (×2): qty 90, 90d supply, fill #0

## 2024-10-06 ENCOUNTER — Other Ambulatory Visit (HOSPITAL_COMMUNITY): Payer: Self-pay

## 2024-10-12 ENCOUNTER — Other Ambulatory Visit: Payer: Self-pay

## 2024-10-15 ENCOUNTER — Other Ambulatory Visit (HOSPITAL_COMMUNITY): Payer: Self-pay

## 2024-10-17 ENCOUNTER — Other Ambulatory Visit (HOSPITAL_BASED_OUTPATIENT_CLINIC_OR_DEPARTMENT_OTHER): Payer: Self-pay

## 2024-11-11 ENCOUNTER — Encounter: Payer: MEDICAID | Admitting: Skilled Nursing Facility1

## 2024-11-12 ENCOUNTER — Other Ambulatory Visit: Payer: Self-pay

## 2024-11-12 ENCOUNTER — Other Ambulatory Visit (HOSPITAL_COMMUNITY): Payer: Self-pay

## 2024-12-09 ENCOUNTER — Other Ambulatory Visit (HOSPITAL_COMMUNITY): Payer: Self-pay

## 2024-12-09 ENCOUNTER — Other Ambulatory Visit: Payer: Self-pay
